# Patient Record
Sex: Male | Born: 1940 | Race: White | Hispanic: No | Marital: Married | Smoking: Former smoker
Health system: Southern US, Community
[De-identification: ages and names within clinical notes are randomized; demographics above are authoritative.]

## PROBLEM LIST (undated history)

## (undated) DIAGNOSIS — M1710 Unilateral primary osteoarthritis, unspecified knee: Secondary | ICD-10-CM

## (undated) DIAGNOSIS — K5900 Constipation, unspecified: Secondary | ICD-10-CM

## (undated) DIAGNOSIS — Z6841 Body Mass Index (BMI) 40.0 and over, adult: Secondary | ICD-10-CM

## (undated) DIAGNOSIS — G4733 Obstructive sleep apnea (adult) (pediatric): Secondary | ICD-10-CM

## (undated) DIAGNOSIS — I4719 Other supraventricular tachycardia: Secondary | ICD-10-CM

## (undated) DIAGNOSIS — K922 Gastrointestinal hemorrhage, unspecified: Secondary | ICD-10-CM

## (undated) DIAGNOSIS — M179 Osteoarthritis of knee, unspecified: Secondary | ICD-10-CM

## (undated) DIAGNOSIS — J310 Chronic rhinitis: Secondary | ICD-10-CM

## (undated) DIAGNOSIS — F5104 Psychophysiologic insomnia: Secondary | ICD-10-CM

## (undated) DIAGNOSIS — D126 Benign neoplasm of colon, unspecified: Secondary | ICD-10-CM

## (undated) DIAGNOSIS — G8929 Other chronic pain: Secondary | ICD-10-CM

## (undated) DIAGNOSIS — I471 Supraventricular tachycardia: Secondary | ICD-10-CM

## (undated) DIAGNOSIS — E119 Type 2 diabetes mellitus without complications: Secondary | ICD-10-CM

## (undated) DIAGNOSIS — M545 Low back pain, unspecified: Secondary | ICD-10-CM

## (undated) DIAGNOSIS — I872 Venous insufficiency (chronic) (peripheral): Secondary | ICD-10-CM

## (undated) DIAGNOSIS — J309 Allergic rhinitis, unspecified: Secondary | ICD-10-CM

## (undated) DIAGNOSIS — M47816 Spondylosis without myelopathy or radiculopathy, lumbar region: Secondary | ICD-10-CM

## (undated) DIAGNOSIS — D649 Anemia, unspecified: Secondary | ICD-10-CM

## (undated) DIAGNOSIS — E781 Pure hyperglyceridemia: Secondary | ICD-10-CM

## (undated) DIAGNOSIS — I10 Essential (primary) hypertension: Secondary | ICD-10-CM

## (undated) DIAGNOSIS — G6289 Other specified polyneuropathies: Secondary | ICD-10-CM

## (undated) DIAGNOSIS — G629 Polyneuropathy, unspecified: Secondary | ICD-10-CM

## (undated) HISTORY — DX: Low back pain, unspecified: M54.50

## (undated) HISTORY — PX: OTHER SURGICAL HISTORY: SHX169

## (undated) HISTORY — DX: Psychophysiologic insomnia: F51.04

## (undated) HISTORY — PX: GLAUCOMA SURGERY: SHX656

## (undated) HISTORY — DX: Hypercalcemia: E83.52

## (undated) HISTORY — DX: Venous insufficiency (chronic) (peripheral): I87.2

## (undated) HISTORY — DX: Other specified polyneuropathies: G62.89

## (undated) HISTORY — DX: Gastrointestinal hemorrhage, unspecified: K92.2

## (undated) HISTORY — DX: Unilateral primary osteoarthritis, unspecified knee: M17.10

## (undated) HISTORY — DX: Other chronic pain: G89.29

## (undated) HISTORY — DX: Type 2 diabetes mellitus without complications: E11.9

## (undated) HISTORY — DX: Constipation, unspecified: K59.00

## (undated) HISTORY — PX: CORNEAL TRANSPLANT: SHX108

## (undated) HISTORY — DX: Body Mass Index (BMI) 40.0 and over, adult: Z684

## (undated) HISTORY — DX: Supraventricular tachycardia: I47.1

## (undated) HISTORY — DX: Spondylosis without myelopathy or radiculopathy, lumbar region: M47.816

## (undated) HISTORY — DX: Osteoarthritis of knee, unspecified: M17.9

## (undated) HISTORY — DX: Other supraventricular tachycardia: I47.19

## (undated) HISTORY — DX: Benign neoplasm of colon, unspecified: D12.6

## (undated) HISTORY — DX: Allergic rhinitis, unspecified: J30.9

## (undated) HISTORY — DX: Pure hyperglyceridemia: E78.1

## (undated) HISTORY — DX: Morbid (severe) obesity due to excess calories: E66.01

## (undated) HISTORY — PX: HAND SURGERY: SHX662

## (undated) HISTORY — DX: Obstructive sleep apnea (adult) (pediatric): G47.33

## (undated) HISTORY — DX: Chronic rhinitis: J31.0

---

## 1999-06-16 ENCOUNTER — Encounter: Admission: RE | Admit: 1999-06-16 | Discharge: 1999-06-16 | Payer: Self-pay | Admitting: *Deleted

## 1999-06-16 ENCOUNTER — Encounter: Payer: Self-pay | Admitting: *Deleted

## 2002-02-04 ENCOUNTER — Inpatient Hospital Stay (HOSPITAL_COMMUNITY): Admission: EM | Admit: 2002-02-04 | Discharge: 2002-02-05 | Payer: Self-pay | Admitting: Emergency Medicine

## 2002-02-04 ENCOUNTER — Encounter: Payer: Self-pay | Admitting: Emergency Medicine

## 2002-02-04 ENCOUNTER — Encounter: Payer: Self-pay | Admitting: Internal Medicine

## 2002-02-20 ENCOUNTER — Inpatient Hospital Stay (HOSPITAL_COMMUNITY): Admission: EM | Admit: 2002-02-20 | Discharge: 2002-02-21 | Payer: Self-pay | Admitting: Emergency Medicine

## 2002-03-21 ENCOUNTER — Ambulatory Visit (HOSPITAL_BASED_OUTPATIENT_CLINIC_OR_DEPARTMENT_OTHER): Admission: RE | Admit: 2002-03-21 | Discharge: 2002-03-21 | Payer: Self-pay | Admitting: Internal Medicine

## 2009-11-02 ENCOUNTER — Encounter: Admission: RE | Admit: 2009-11-02 | Discharge: 2009-11-02 | Payer: Self-pay | Admitting: Urology

## 2009-11-02 IMAGING — CR DG CHEST 2V
2 series · 2 of 2 positions shown · non-contrast
Comparison: None.

CLINICAL DATA: Preop.

CHEST - 2 VIEW

[view not recorded (1 of 2)]
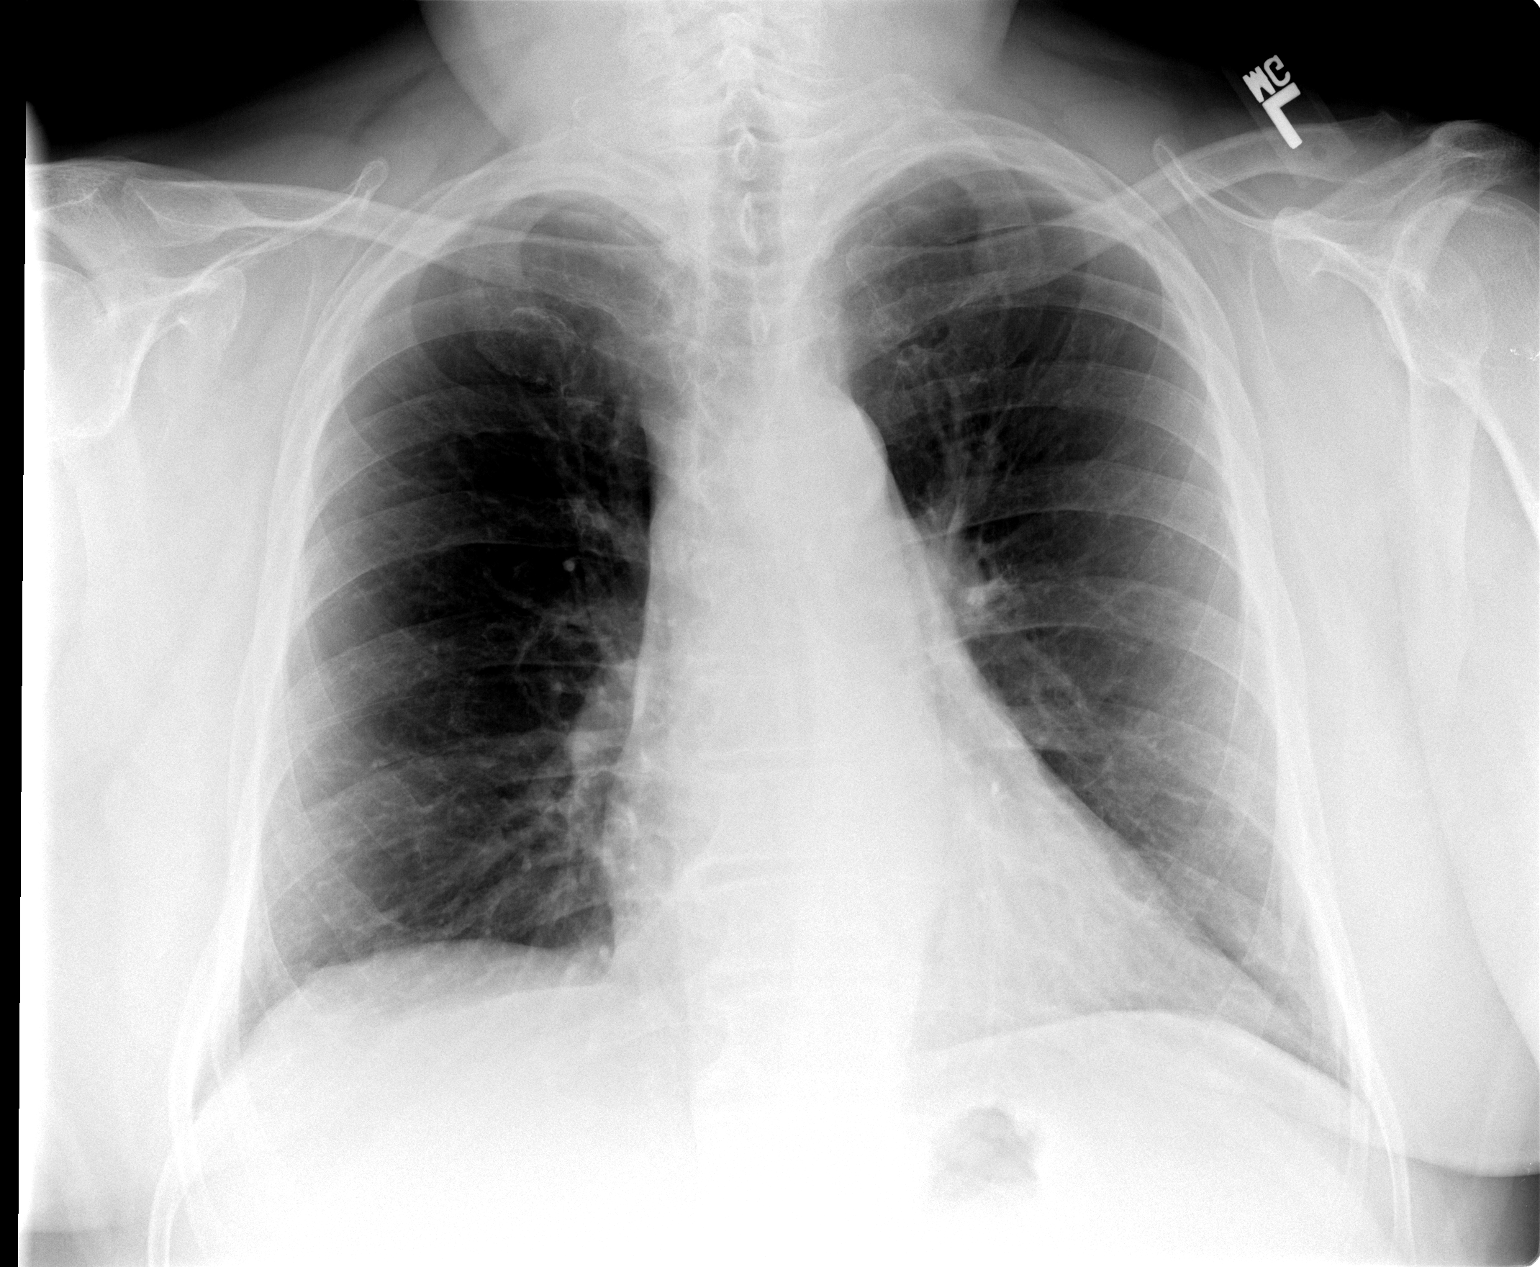

[view not recorded (2 of 2)]
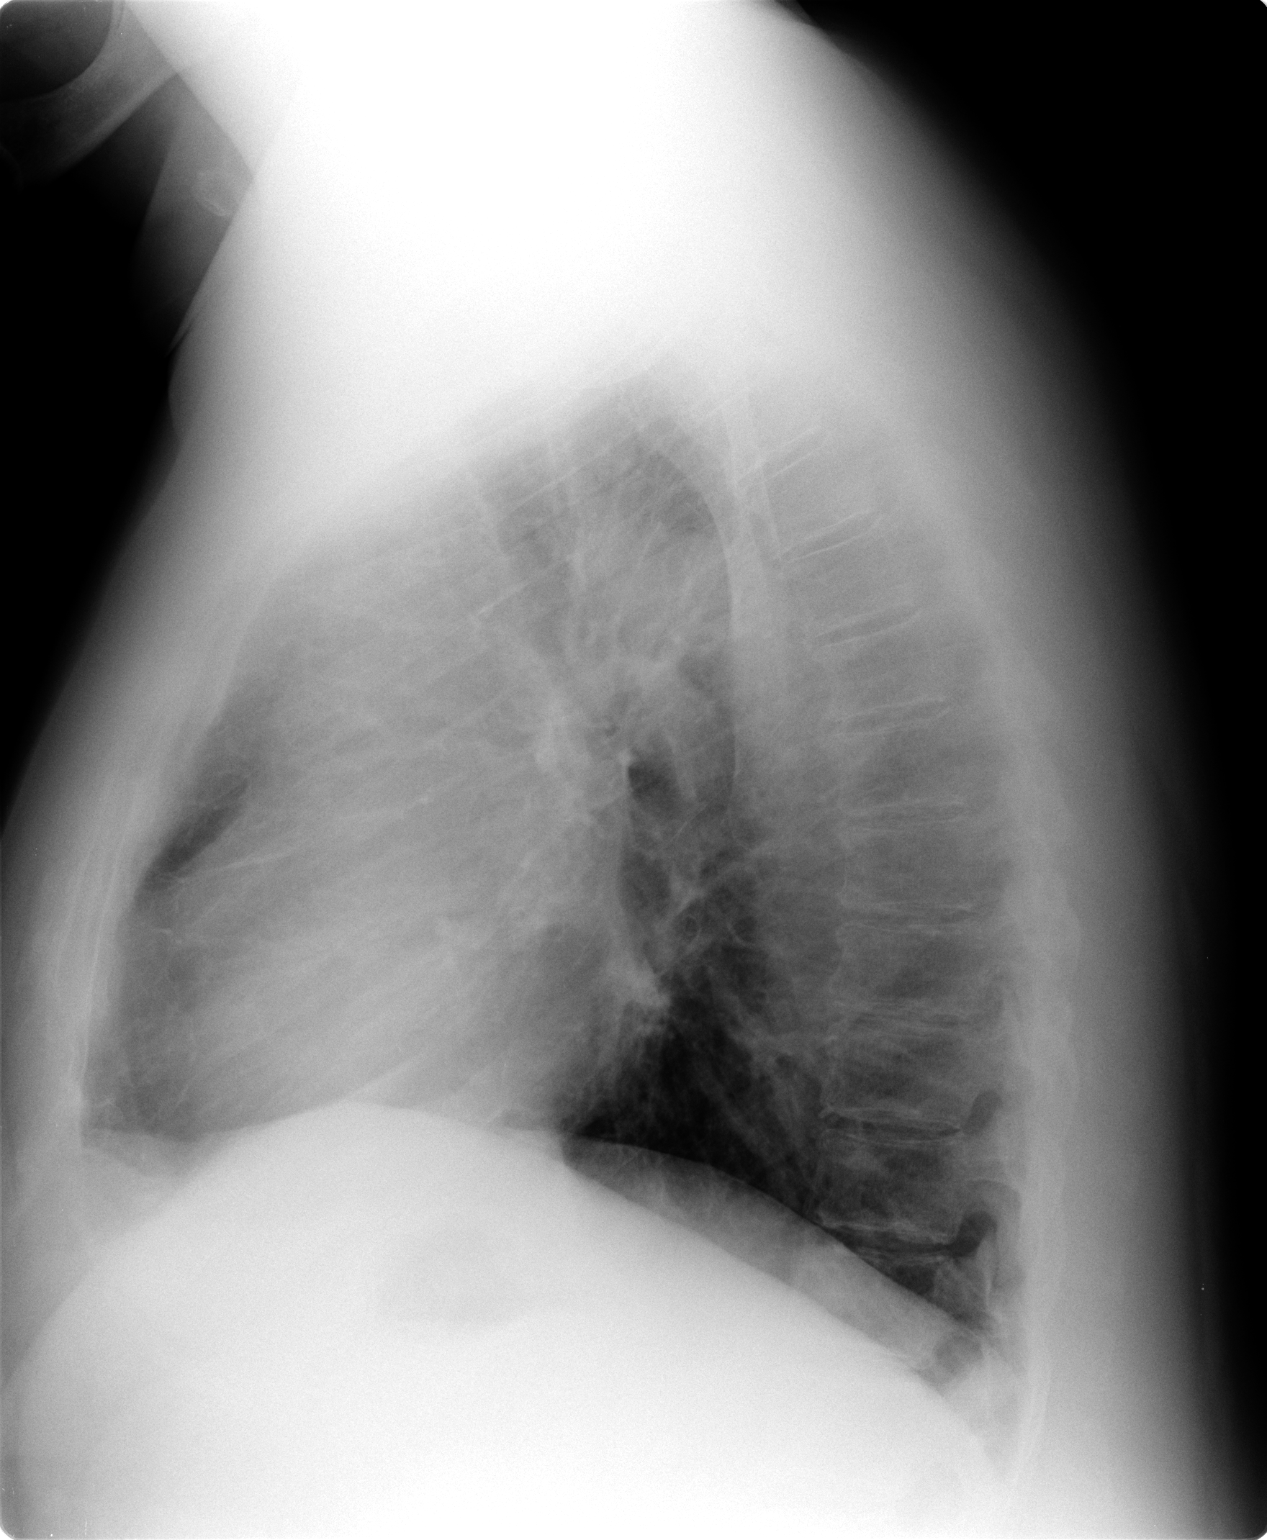

[2 of 2 positions shown; findings below may reference images not displayed]

FINDINGS: Trachea is midline.  Heart size normal.  Lungs are clear.
No pleural fluid.
IMPRESSION: No acute findings.

## 2009-11-05 ENCOUNTER — Ambulatory Visit (HOSPITAL_BASED_OUTPATIENT_CLINIC_OR_DEPARTMENT_OTHER): Admission: RE | Admit: 2009-11-05 | Discharge: 2009-11-05 | Payer: Self-pay | Admitting: Urology

## 2010-03-23 LAB — CBC
HCT: 47.3 % (ref 39.0–52.0)
Hemoglobin: 16.3 g/dL (ref 13.0–17.0)
MCH: 30.4 pg (ref 26.0–34.0)
MCHC: 34.5 g/dL (ref 30.0–36.0)
MCV: 88 fL (ref 78.0–100.0)
Platelets: 211 10*3/uL (ref 150–400)
RBC: 5.37 MIL/uL (ref 4.22–5.81)
RDW: 14.1 % (ref 11.5–15.5)
WBC: 8.7 10*3/uL (ref 4.0–10.5)

## 2010-03-23 LAB — BASIC METABOLIC PANEL
BUN: 8 mg/dL (ref 6–23)
CO2: 29 mEq/L (ref 19–32)
Calcium: 10.3 mg/dL (ref 8.4–10.5)
Chloride: 103 mEq/L (ref 96–112)
Creatinine, Ser: 1.13 mg/dL (ref 0.4–1.5)
GFR calc Af Amer: >60 mL/min (ref >60)
GFR calc non Af Amer: >60 mL/min (ref >60)
Glucose, Bld: 118 mg/dL — ABNORMAL HIGH (ref 70–99)
Potassium: 4.3 mEq/L (ref 3.5–5.1)
Sodium: 139 mEq/L (ref 135–145)
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
## 2010-05-27 NOTE — Discharge Summary (Signed)
NAME:  [name], [name] NO.:  [id number]   MEDICAL RECORD NO.:  [id number]                   PATIENT TYPE:  INP   LOCATION:  3301                                 FACILITY:  [facility]   PHYSICIAN:  Thora Lance, M.D.               DATE OF BIRTH:  1940/04/01   DATE OF ADMISSION:  02/04/2002  DATE OF DISCHARGE:  02/05/2002                                 DISCHARGE SUMMARY   REASON FOR ADMISSION:  Difficult to arouse.   HISTORY OF PRESENT ILLNESS:  The patient is a 70 year old white male,  history of hypertension, dyslipidemia, chronic insomnia.  Presented with  altered mental status.  On the morning of admission his wife found him  difficult to arouse, confused, and not making any sense.  Over the last two  months the patient has complained of frequent headaches and has been very  forgetful, not remembering things his wife tells him.  He has been very  tired, no energy.  He has a history of loud snoring and had a sleep study  about two to three years ago.  It apparently did not show definite  obstructive sleep apnea.  His wife had refilled his Ambien 10 mg #30 on  February 01, 2002, and now all of those pills are gone seven days later.  He  has imipramine 50 mg #60 refilled on December 30, 2001, and takes two of  these every night.  The family denies any recent depression.   PHYSICAL EXAMINATION:  GENERAL:  An arousable but very drowsy white male.  VITAL SIGNS:  Blood pressure 120/70, heart rate 80, respirations 24,  temperature 98.4, oxygen saturation 91-92%.  NEUROLOGIC:  The patient was arousable but very lethargic.  He moved all  extremities.  His neurologic exam was nonfocal.   ADMISSION LABORATORY DATA:  CBC:  WBC 8.2, hemoglobin 15, platelet count  238.  Chemistries:  Sodium 136, potassium 4.2, chloride 106, glucose 131,  BUN 16, creatinine 1.3, calcium 9.2, total protein 7, albumin 3.5, AST 25,  ALT 31, alkaline phosphatase 76, total bilirubin  0.7.  TSH 0.53.  B12 576.   Chest x-ray showed no acute disease.   HOSPITAL COURSE:  The patient was admitted to the stepdown unit.  He was  treated with IV fluids.  All medications were held.  A CT scan of the brain  was done and suggested a small temporal lobe lesion but no evidence of CVA,  bleed, or shift.  An MRI of the brain was obtained to further evaluate for a  brain lesion.  The MRI showed an abnormally increased signal centrally  within the pons, differential including seminal vessel disease, central  pontine myelinolysis.  There was no evidence of any brain mass.  The patient  was seen by Dr. Anne Hahn of neurology.  Dr. Anne Hahn' impression was the patient  likely had altered mental status secondary to  an Ambien overdose.  He also  felt that the patient likely had obstructive sleep apnea with significant  symptoms.  He thought that the MRI findings were minimal and consistent with  hypertensive changes in the pons.  By the second hospital day the patient  was doing much better.  His mental status changes had resolved, and he was  alert and oriented.  The patient's Ambien was discontinued, and his  imipramine was restarted.  His IV fluids were stopped, and his Foley was  removed.  The patient ambulated without problem and was discharged later in  the day.   DISCHARGE DIAGNOSES:  1. Altered mental status.  2. Possible Ambien overdose.  3. Probable obstructive sleep apnea.  4. Hypertension.  5. Chronic insomnia.  6. Dyslipidemia.  7. Glaucoma.   PROCEDURES:  1. CT scan of the brain.  2. MRI of the brain.   DISCHARGE MEDICATIONS:  1. Lisinopril 10 mg p.o. daily.  2. Norvasc 5 mg p.o. daily.  3. Imipramine 50-100 mg p.o. q.h.s.  4. Aspirin 81 mg a day.   DIET:  Low sodium.   ACTIVITY:  As tolerated.   FOLLOW-UP:  In two weeks with Dr. Valentina Lucks.  The patient will have an  outpatient sleep study arranged.                                               Thora Lance,  M.D.    Delorse Limber  D:  02/27/2002  T:  02/28/2002  Job:  [id number]

## 2010-05-27 NOTE — Op Note (Signed)
NAME:  [name], [name] NO.:  [id number]   MEDICAL RECORD NO.:  [id number]                   PATIENT TYPE:  INP   LOCATION:  5738                                 FACILITY:  [facility]   PHYSICIAN:  Danise Edge, M.D.                DATE OF BIRTH:  05-08-1940   DATE OF PROCEDURE:  02/21/2002  DATE OF DISCHARGE:                                 OPERATIVE REPORT   PROCEDURE:  Esophagogastroduodenoscopy.   INDICATIONS:  The patient is a 70 year old male admitted to the hospital  yesterday with upper gastrointestinal bleeding manifested by the passage of  melenic stool and vomiting coffee-grounds emesis.  Emergency  esophagogastroduodenoscopy was incomplete due to the presence of food,  coffee-grounds liquid, and blood clots in the gastric fundus; the remainder  of the upper GI endoscopy was normal.  The patient has stopped bleeding off  aspirin and on Protonix.  There has been no significant drop in his  hemoglobin.   ENDOSCOPIST:  Danise Edge, M.D.   PREMEDICATION:  Versed 5 mg, Demerol 50 mg.   ENDOSCOPE:  Olympus gastroscope.   DESCRIPTION OF PROCEDURE:  After obtaining informed consent, the patient was  placed in the left lateral decubitus position.  I administered intravenous  Demerol and intravenous Versed to achieve conscious sedation for the  procedure.  The patient's blood pressure, oxygen saturation, and cardiac  rhythm were monitored throughout the procedure and documented in the medical  record.   The Olympus gastroscope was passed through the posterior hypopharynx into  the proximal esophagus without difficulty.  The hypopharynx, larynx, and  vocal cords appeared normal.   Esophagoscopy:  The proximal and midsegments of the esophagus appear normal.  There are linear erosions with exudative bases extending up from the  esophagogastric junction into the distal esophagus without bleeding.   Gastroscopy:  There is a small hiatal hernia.   Retroflexed view of the  gastric cardia and fundus was normal.  The gastric body, antrum, and pylorus  appear normal.  There is no blood in the stomach.   Duodenoscopy:  The duodenal bulb and descending duodenum appear normal.   ASSESSMENT:  1. Resolved upper gastrointestinal bleeding, etiology undetermined.  2. Linear erosions in the distal esophagus associated with a small hiatal     hernia.    RECOMMENDATIONS:  1. Remain off aspirin.  2. Begin Protonix.  3. Discharge from the hospital today.                                               Danise Edge, M.D.    MJ/MEDQ  D:  02/21/2002  T:  02/21/2002  Job:  [id number]   cc:   Thora Lance, M.D.  [address]  [address]  [place] [address]  Fax: [phone]

## 2010-05-27 NOTE — Op Note (Signed)
   NAME:  [name], [name] NO.:  [id number]   MEDICAL RECORD NO.:  [id number]                   PATIENT TYPE:  EMS   LOCATION:  MAJO                                 FACILITY:  [facility]   PHYSICIAN:  Danise Edge, M.D.                DATE OF BIRTH:  [birth year]   DATE OF PROCEDURE:  DATE OF DISCHARGE:                                 OPERATIVE REPORT   PROCEDURE:  Emergency esophagogastroduodenoscopy.   DESCRIPTION OF PROCEDURE:  After obtaining informed consent, [name] was  placed in the left lateral decubitus position.  I administered intravenous  Demerol 40 mg and intravenous Versed 5 mg to achieve conscious sedation or  the procedure.  The patient's blood pressure, oxygen saturation and cardiac  rhythm were monitoring at the operative suite and documented in the medical  record.   The Olympus gastroscope was passed through the posterior hypopharynx into  the proximal esophagus without difficulty.  The hypopharynx, larynx and  vocal cords appeared normal.   Esophagoscopy:  The proximal mid and lower segments of the esophagus  appeared normal. There is no bleeding from the esophagus.   Gastroscopy:  There is a small hiatal hernia.  Examination of the gastric  cardia is normal.  I am unable to examine the fundic mucosa or the greater  curvature aspect of the gastric body due to a large amount of food and  coffeegrounds liquid obscuring my view.  The distal gastric body, antrum and  pylorus appeared normal.   Duodenoscopy:  The duodenal bulb and descending duodenum appeared normal.   ASSESSMENT:  Upper gastrointestinal bleed, localized to either the fundus or  proximal gastric body.   RECOMMENDATIONS:  1. Intravenous Protonix.  2. Clear liquid diet.  3. Repeat esophagogastroduodenoscopy February 21, 2002.                                               Danise Edge, M.D.    MJ/MEDQ  D:  02/20/2002  T:  02/20/2002  Job:  [id number]   cc:   Thora Lance, M.D.  [address]  [place]  [place] [address]  Fax: [phone]

## 2010-05-27 NOTE — Op Note (Signed)
   NAME:  TOSH, GLAZE NO.:  [id number]   MEDICAL RECORD NO.:  [id number]                   PATIENT TYPE:  EMS   LOCATION:  MAJO                                 FACILITY:  [facility]   PHYSICIAN:  Danise Edge, M.D.                DATE OF BIRTH:  11-Oct-1940   DATE OF PROCEDURE:  DATE OF DISCHARGE:                                 OPERATIVE REPORT   ADMISSION PROBLEM:  Upper gastrointestinal bleeding.   HISTORY:  Mr. [name] is a 70 year old male born 04/29/1940.  Mr.  Hoskin takes 81 mg of aspirin daily and presents to the emergency room passing  melenic stool and vomiting coffeegrounds liquid.  There is no past history  of peptic ulcer disease.   MEDICATION ALLERGIES:  None.   CHRONIC MEDICATIONS:  1. Lisinopril 10 mg daily.  2. Norvasc 5 mg daily.  3. Imipramine 100 mg nightly.  4. Aspirin 81 mg daily.   PAST MEDICAL HISTORY:  1. Hypertension.  2. Hyperlipidemia.  3. Insomnia.  4. Glaucoma.  5. Allergic rhinitis.  6. Left second finger amputation.  7. Laser surgery to the eyes.  8. Cataracts.   HABITS:  Mr. Mcpartlin doesn't smoke cigarettes or consume alcohol.   FAMILY HISTORY:  Noncontributory.   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   LABORATORY DATA:  Admission CBC normal except hemoglobin 12.9.  In addition,  complete metabolic profile normal except random glucose 142, BUN 33, albumin  3.3.  Admission prothrombin time, INR and lipase normal.   PHYSICAL EXAMINATION:  GENERAL APPEARANCE:  Mr. Thurman is alert and appears  comfortable, lying on his stretcher.  HEENT:  Sclerae non-icteric.  Oropharynx normal.  LUNGS:  Clear to auscultation.  CARDIAC EXAM:  Reveals a regular rhythm without murmurs.  ABDOMEN:  Soft, flat and nontender.   ASSESSMENT:  Upper gastrointestinal bleeding, probably aspirin-induced  ulcer.                                                 Danise Edge, M.D.    MJ/MEDQ   D:  02/20/2002  T:  02/20/2002  Job:  [id number]

## 2010-05-27 NOTE — Consult Note (Signed)
NAME:  [name], [name] NO.:  [id number]   MEDICAL RECORD NO.:  [id number]                   PATIENT TYPE:  INP   LOCATION:  3301                                 FACILITY:  [facility]   PHYSICIAN:  Marlan Palau, M.D.               DATE OF BIRTH:  May 29, 1940   DATE OF CONSULTATION:  02/04/2002  DATE OF DISCHARGE:                                   CONSULTATION   HISTORY OF PRESENT ILLNESS:  The patient is a 70 year old right-handed white  male born 1940-05-18 with a history of obesity, hypertension, and chronic  insomnia.  This patient snores quite a bit, has been evaluated by Dr. Maple Hudson  a decade ago with a sleep study.  He was found to have periodic limb  movements at night but no definite obstructive sleep apnea seen.  This  patient has continued to worsen with snoring problems and has had increasing  fatigue, memory disturbance.  Over the last two months, he has developed  early morning headaches.  Wife has also noted periodic limb movements at  night with sleep with jerks and twitches occurring during periods of apnea.  This patient is on imipramine and Ambien on a chronic basis for his sleeping  disturbance.  The patient last filled 60 of a 50 mg imipramine on 30 December 2001 and 30 of the 10 mg Ambien on 28 January 2002.  Both bottle  were empty this morning when the patient's wife came into wake him up.  The  patient could not be fully aroused, and the patient was sent to the hospital  for an evaluation.   A CT scan of the brain was performed, and there was some questionable  abnormality in the anterior portion of the left temporal lobe, but MRI scan  of the brain showed no such abnormalities. 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  The patient was admitted for further evaluation,  seems to be improving gradually as time goes on.  Neurology was asked to  see  the patient for further evaluation.   PAST MEDICAL HISTORY:  1. History of altered mental status with lethargy today with possible drug     overdose.  2. MRI scan abnormalities. By my reading, appears to be most consistent with     minimal patchy white matter changes in the pons consistent with     hypertension.  3. Obesity.  4. Obstructive sleep apnea.  5. Headache as above.  6. Cataract surgery.  7. Glaucoma.  8. Hypertriglyceridemia.  9. Borderline diabetes.  10.      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   ALLERGIES:  No known allergies.   HABITS:  The patient does not smoke or drink.   CURRENT MEDICATIONS:  1. Norvasc 5 mg a day.  2. Lisinopril  10 mg a day.  3. Ambien 10 mg at night.  4. Imipramine 100 mg at night.  5. Cosopt.   SOCIAL HISTORY:  This patient is married and lives with his wife in the  [place] area.   FAMILY MEDICAL HISTORY:  Notable in that mother is alive and well.  Father  died of unknown cause.  The patient has two brothers, one possibly with  coronary artery disease.  One sister died in childbirth or as a young  infant.   REVIEW OF SYSTEMS:  Notable in that the patient does note daily headaches,  worse in the morning.  Denies vision changes, shortness of breath, chest  pain.  Some occasional a.m. nausea noted. Denied problems with bowels or  bladder. Denies numbness or weakness on arms or legs.  Denies dizziness.   PHYSICAL EXAMINATION:  VITAL SIGNS: Blood pressure 110/70, heart rate 79,  respiratory rate 22, temperature afebrile.  GENERAL:  The patient is a moderately to markedly obese white male who is  sleepy but easily alert at the time of examination.  The patient is oriented  to person, place, date.  HEENT:  Head is atraumatic.  Eyes: Pupils are equal, round, and reactive to  light.  Disks are flat bilaterally.  NECK:  Supple.  No carotid bruits noted.  RESPIRATORY:  Examination is clear.   CARDIOVASCULAR:  Distant heart sounds.  No obvious murmurs or rubs noted.  EXTREMITIES:  Without significant edema.  NEUROLOGIC:  Cranial nerves as above.  Facial symmetry is present.  The  patient notes good sensation of face to pinprick and soft touch bilaterally.  Has good strength to facial muscles and muscles to head turning and shoulder  shrug bilaterally.  Visual fields are full. Speech is well enunciated.  Evoked nystagmus is noted in the horizontal planes.  The patient has good  strength in all fours. Good symmetric motor tone is noted throughout.  Sensory testing is intact to pinprick, soft touch, and vibratory sensation  throughout.  Good finger-to-nose and toe-to-finger bilaterally.  The patient  was not ambulated.  Deep tendon reflexes were depressed by symmetric.  Toes  were neutral bilaterally.   LABORATORY DATA:  Notable for white count of 8.2, hemoglobin 17.0,  hematocrit 49.0, MCV 83.4. Sodium136, potassium 4.6, chloride 106, CO2 22,  glucose 131, BUN 16, creatinine 1.3.  Total bilirubin 0.7, alkaline  phosphatase 76, SGOT 25, SGPT 31, total protein 7.0, albumin 3.5, calcium  9.2.  Acetaminophen and salicylate levels were low.  Drug screen otherwise  unremarkable.  Alcohol level less than 5.  Urinalysis reveals specific  gravity 1.021, pH 5.5, otherwise unremarkable.   IMPRESSION:  1. Altered mental status likely secondary to drug overdose.  2. Obstructive sleep apnea with associated symptoms of fatigue, memory     disturbance, early morning headache.  3. Obesity.   This patient appears to have a significant history of worsening sleep apnea  with some very loud snoring.  The patient can no longer sleep with his wife  due to loud snoring.  The wife has observed episodes of apnea with jerking  during the periods of apnea.  The patient again has daytime drowsiness,  falls asleep easily at night but then wakes up frequently in the middle of the night and sometimes cannot  get back to sleep.  The patient will need  further workup for sleep apnea.  The last study was done over a decade ago.   PLAN:  1. No  further neurologic workup is indicated at this time.  2. Sleep study as an outpatient.  3. MRI scan abnormalities as above are very minimal, consistent with     hypertensive changes in the mid pons     region.  I do not believe this is consistent with central pontine     myolysis.  No further neurologic workup is indicated for this problem at     this time.  May at some point consider checking thyroid profile if this     has not been done recently.                                               Marlan Palau, M.D.    CKW/MEDQ  D:  02/04/2002  T:  02/04/2002  Job:  [id number]   cc:   Thora Lance, M.D.  [address] [place]  [place] [address]  Fax: [phone]   [facility]  [address], [place] [place] [address]

## 2012-08-16 ENCOUNTER — Other Ambulatory Visit: Payer: Self-pay | Admitting: Family Medicine

## 2013-01-09 DIAGNOSIS — E119 Type 2 diabetes mellitus without complications: Secondary | ICD-10-CM

## 2013-01-09 HISTORY — DX: Type 2 diabetes mellitus without complications: E11.9

## 2013-02-18 ENCOUNTER — Emergency Department (HOSPITAL_COMMUNITY): Payer: No Typology Code available for payment source

## 2013-02-18 ENCOUNTER — Ambulatory Visit
Admission: RE | Admit: 2013-02-18 | Discharge: 2013-02-18 | Disposition: A | Discharge status: Home / Self Care | Payer: Medicare Other | Source: Ambulatory Visit | Attending: Nurse Practitioner | Admitting: Nurse Practitioner

## 2013-02-18 ENCOUNTER — Encounter (HOSPITAL_COMMUNITY): Payer: Self-pay | Admitting: Emergency Medicine

## 2013-02-18 ENCOUNTER — Other Ambulatory Visit: Payer: Self-pay | Admitting: Nurse Practitioner

## 2013-02-18 ENCOUNTER — Emergency Department (HOSPITAL_COMMUNITY)
Admission: EM | Admit: 2013-02-18 | Discharge: 2013-02-18 | Disposition: A | Discharge status: Home / Self Care | Payer: No Typology Code available for payment source | Attending: Emergency Medicine | Admitting: Emergency Medicine

## 2013-02-18 DIAGNOSIS — S20219A Contusion of unspecified front wall of thorax, initial encounter: Secondary | ICD-10-CM | POA: Insufficient documentation

## 2013-02-18 DIAGNOSIS — Z8669 Personal history of other diseases of the nervous system and sense organs: Secondary | ICD-10-CM | POA: Insufficient documentation

## 2013-02-18 DIAGNOSIS — R600 Localized edema: Secondary | ICD-10-CM

## 2013-02-18 DIAGNOSIS — IMO0002 Reserved for concepts with insufficient information to code with codable children: Secondary | ICD-10-CM | POA: Insufficient documentation

## 2013-02-18 DIAGNOSIS — S0993XA Unspecified injury of face, initial encounter: Secondary | ICD-10-CM | POA: Insufficient documentation

## 2013-02-18 DIAGNOSIS — M549 Dorsalgia, unspecified: Secondary | ICD-10-CM

## 2013-02-18 DIAGNOSIS — R109 Unspecified abdominal pain: Secondary | ICD-10-CM

## 2013-02-18 DIAGNOSIS — R0789 Other chest pain: Secondary | ICD-10-CM

## 2013-02-18 DIAGNOSIS — S0990XA Unspecified injury of head, initial encounter: Secondary | ICD-10-CM | POA: Insufficient documentation

## 2013-02-18 DIAGNOSIS — S199XXA Unspecified injury of neck, initial encounter: Secondary | ICD-10-CM

## 2013-02-18 DIAGNOSIS — I10 Essential (primary) hypertension: Secondary | ICD-10-CM | POA: Insufficient documentation

## 2013-02-18 DIAGNOSIS — R0781 Pleurodynia: Secondary | ICD-10-CM

## 2013-02-18 DIAGNOSIS — S3981XA Other specified injuries of abdomen, initial encounter: Secondary | ICD-10-CM | POA: Insufficient documentation

## 2013-02-18 DIAGNOSIS — Y9389 Activity, other specified: Secondary | ICD-10-CM | POA: Insufficient documentation

## 2013-02-18 DIAGNOSIS — R519 Headache, unspecified: Secondary | ICD-10-CM

## 2013-02-18 DIAGNOSIS — R51 Headache: Secondary | ICD-10-CM

## 2013-02-18 DIAGNOSIS — Y92410 Unspecified street and highway as the place of occurrence of the external cause: Secondary | ICD-10-CM | POA: Insufficient documentation

## 2013-02-18 HISTORY — DX: Polyneuropathy, unspecified: G62.9

## 2013-02-18 HISTORY — DX: Essential (primary) hypertension: I10

## 2013-02-18 LAB — POCT I-STAT, CHEM 8
BUN: 14 mg/dL (ref 6–23)
CALCIUM ION: 1.28 mmol/L (ref 1.13–1.30)
Chloride: 101 mEq/L (ref 96–112)
Creatinine, Ser: 1.1 mg/dL (ref 0.50–1.35)
Glucose, Bld: 117 mg/dL — ABNORMAL HIGH (ref 70–99)
HCT: 46 % (ref 39.0–52.0)
Hemoglobin: 15.6 g/dL (ref 13.0–17.0)
Potassium: 3.9 mEq/L (ref 3.7–5.3)
SODIUM: 141 meq/L (ref 137–147)
TCO2: 29 mmol/L (ref 0–100)
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
## 2013-02-18 IMAGING — CR DG FOOT 2V*L*
2 series · 2 of 2 positions shown · non-contrast
Comparison: None.

CLINICAL DATA: Swelling

EXAM:
LEFT FOOT - 2 VIEW

[t foot ap left]
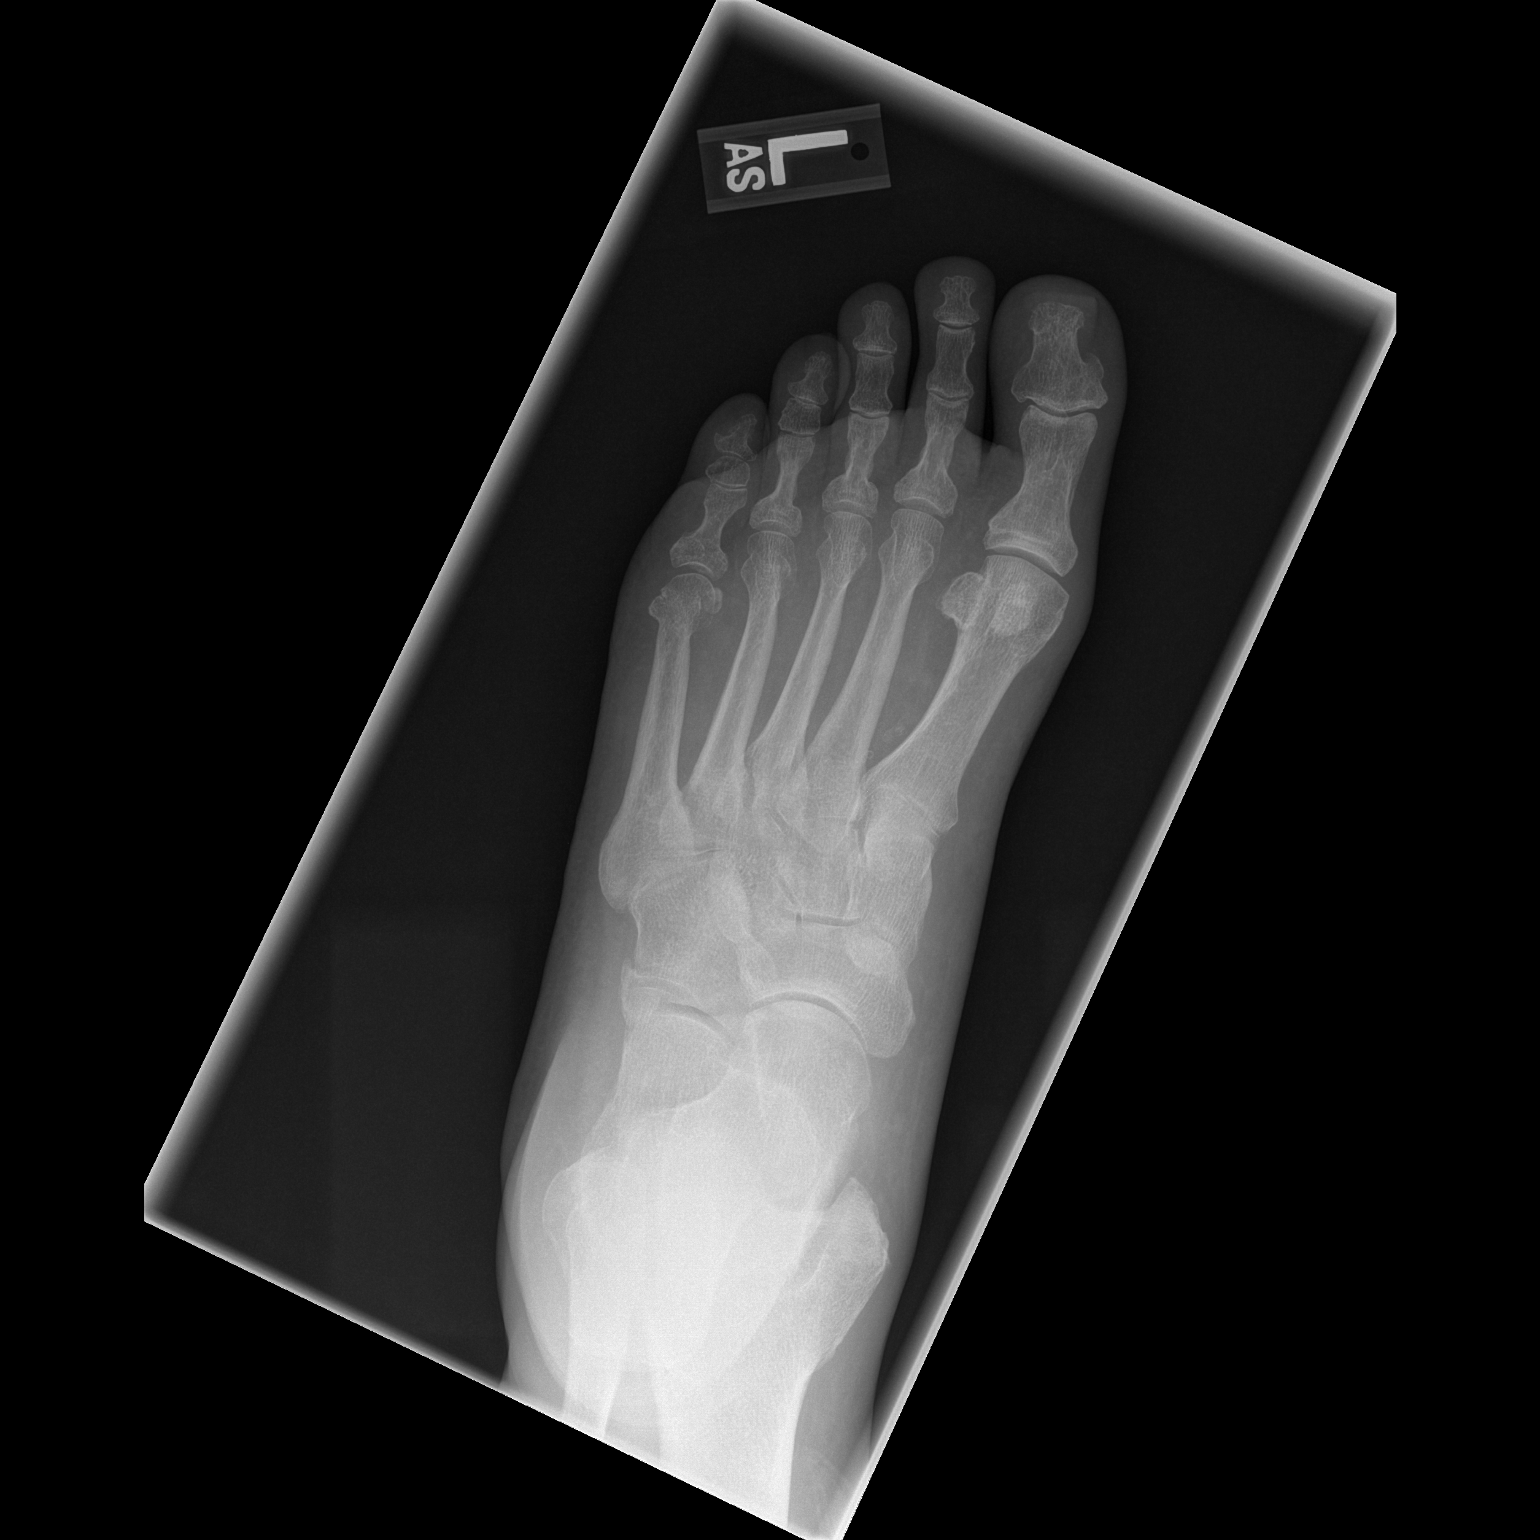

[t foot lat left]
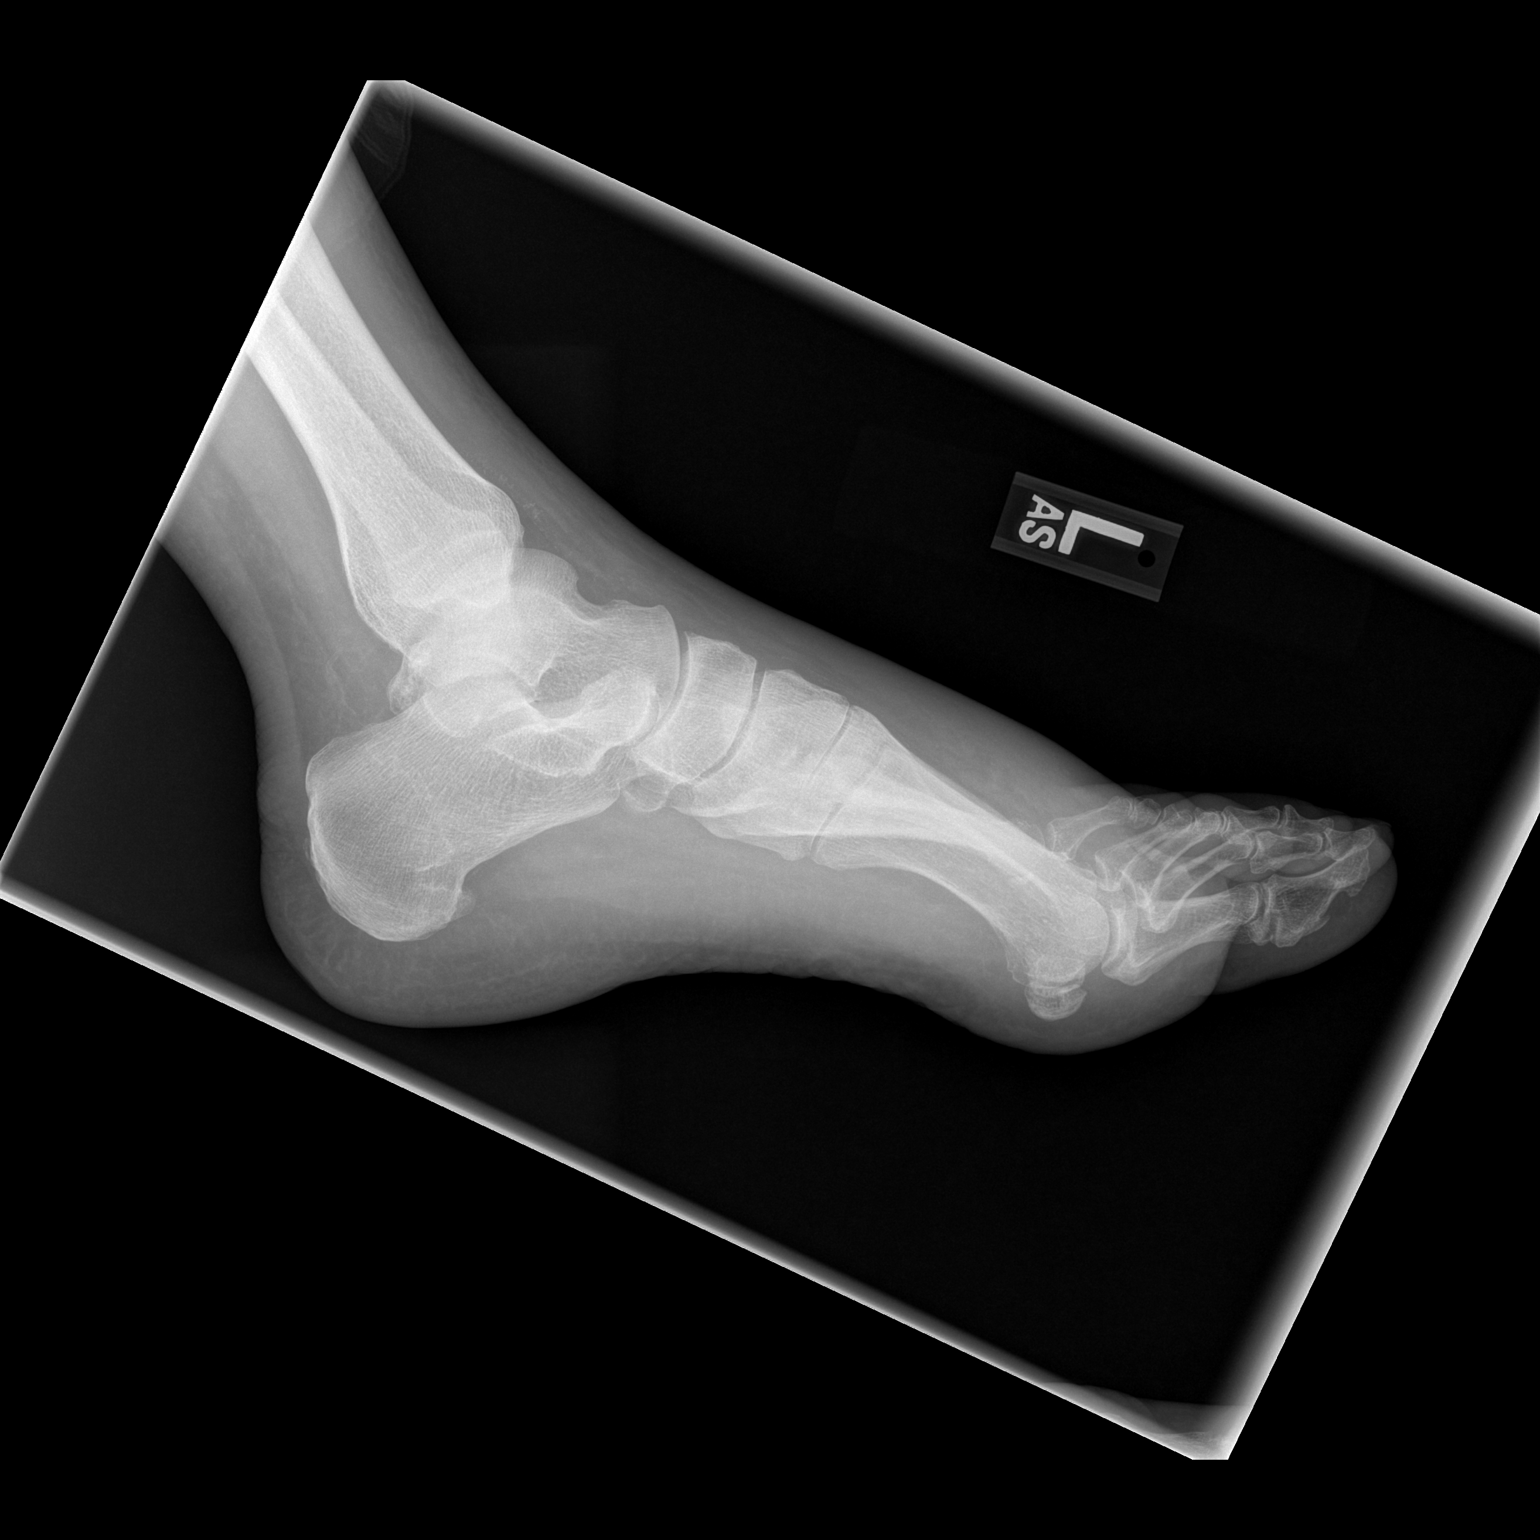

[2 of 2 positions shown; findings below may reference images not displayed]

FINDINGS: There is no evidence of fracture or dislocation. There is no
evidence of arthropathy or other focal bone abnormality. Soft
tissues are unremarkable.
IMPRESSION: Negative.
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
## 2013-02-18 IMAGING — CR DG FOOT 2V*R*
2 series · 2 of 2 positions shown · non-contrast
Comparison: None.

CLINICAL DATA: Swelling, pain

EXAM:
RIGHT FOOT - 2 VIEW

[t foot ap right]
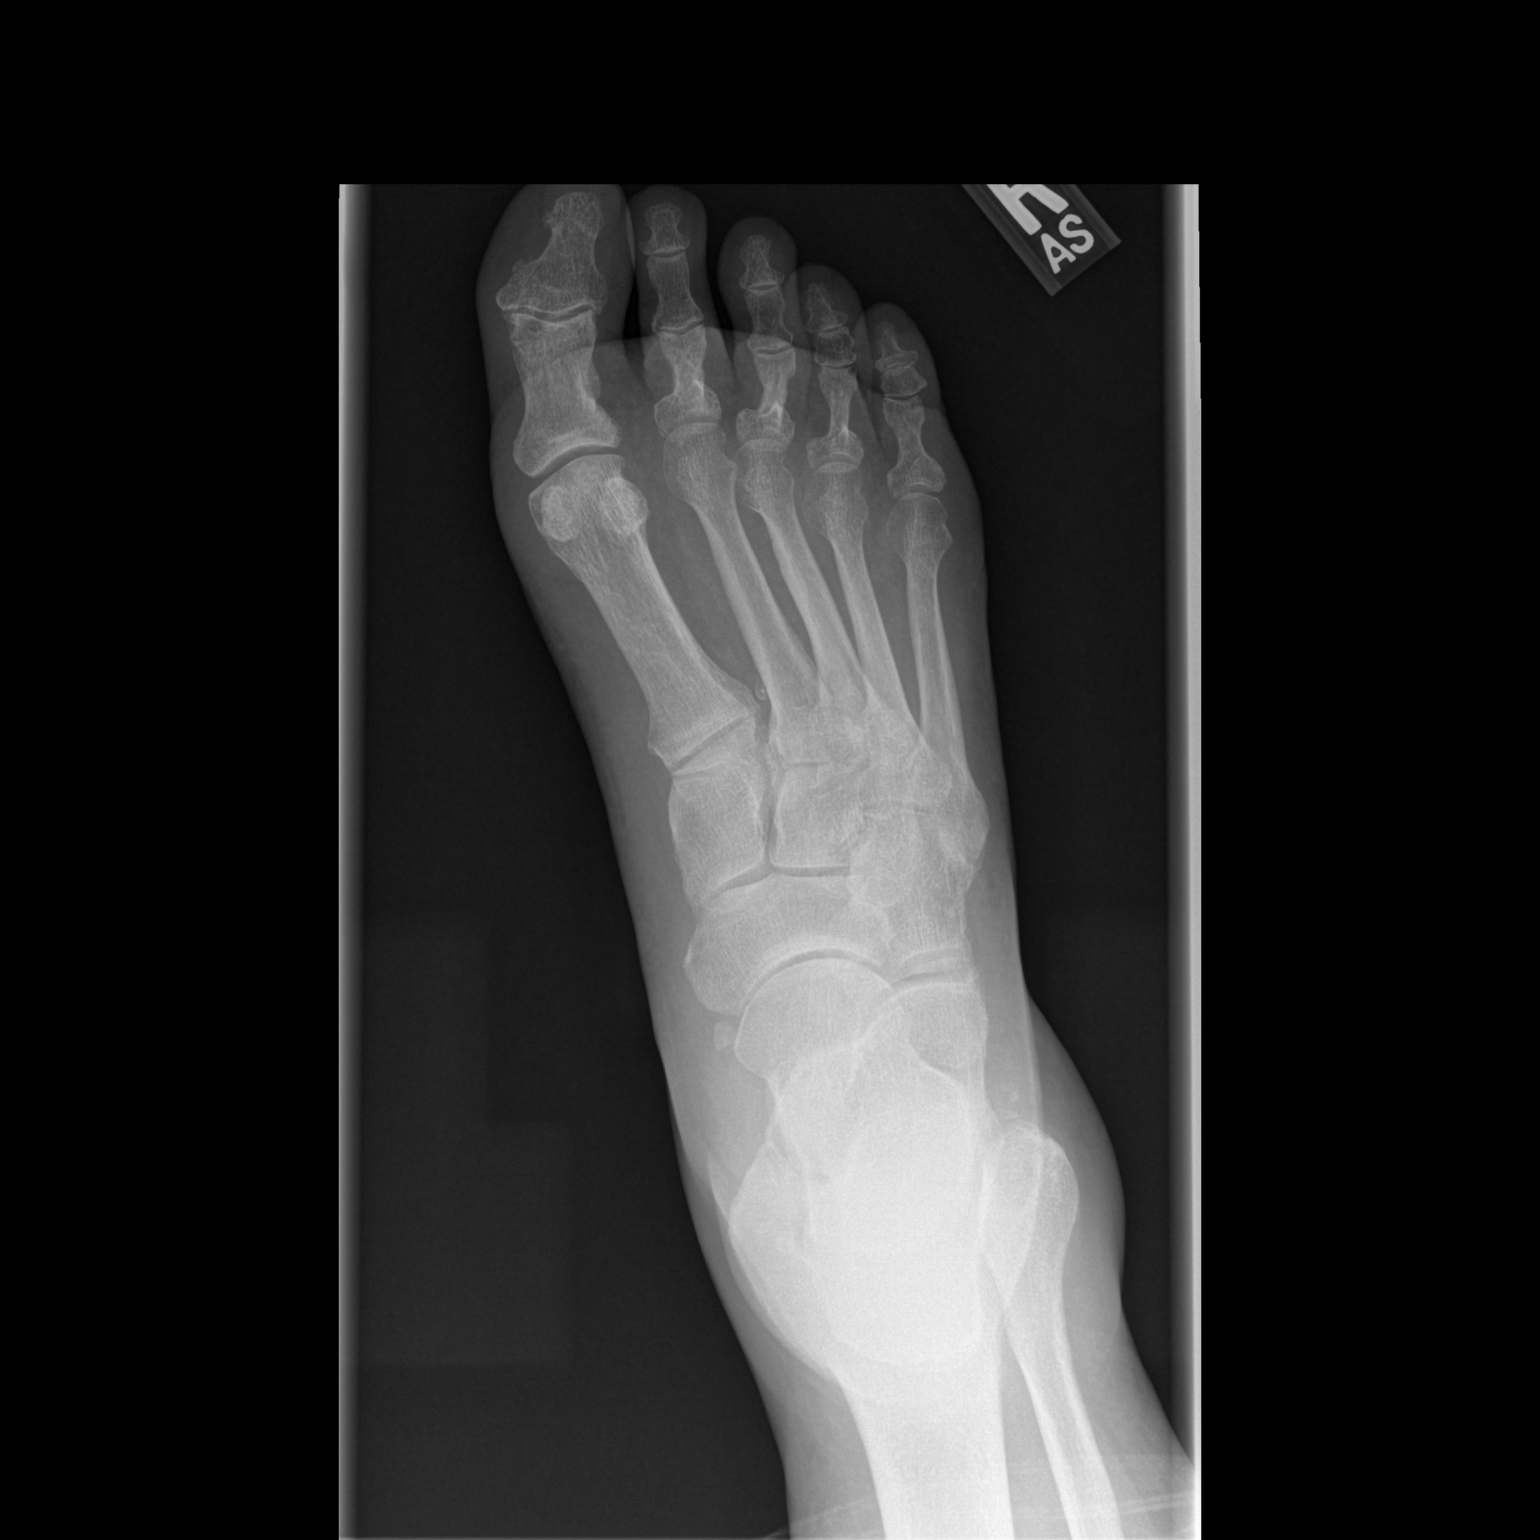

[t foot lat right]
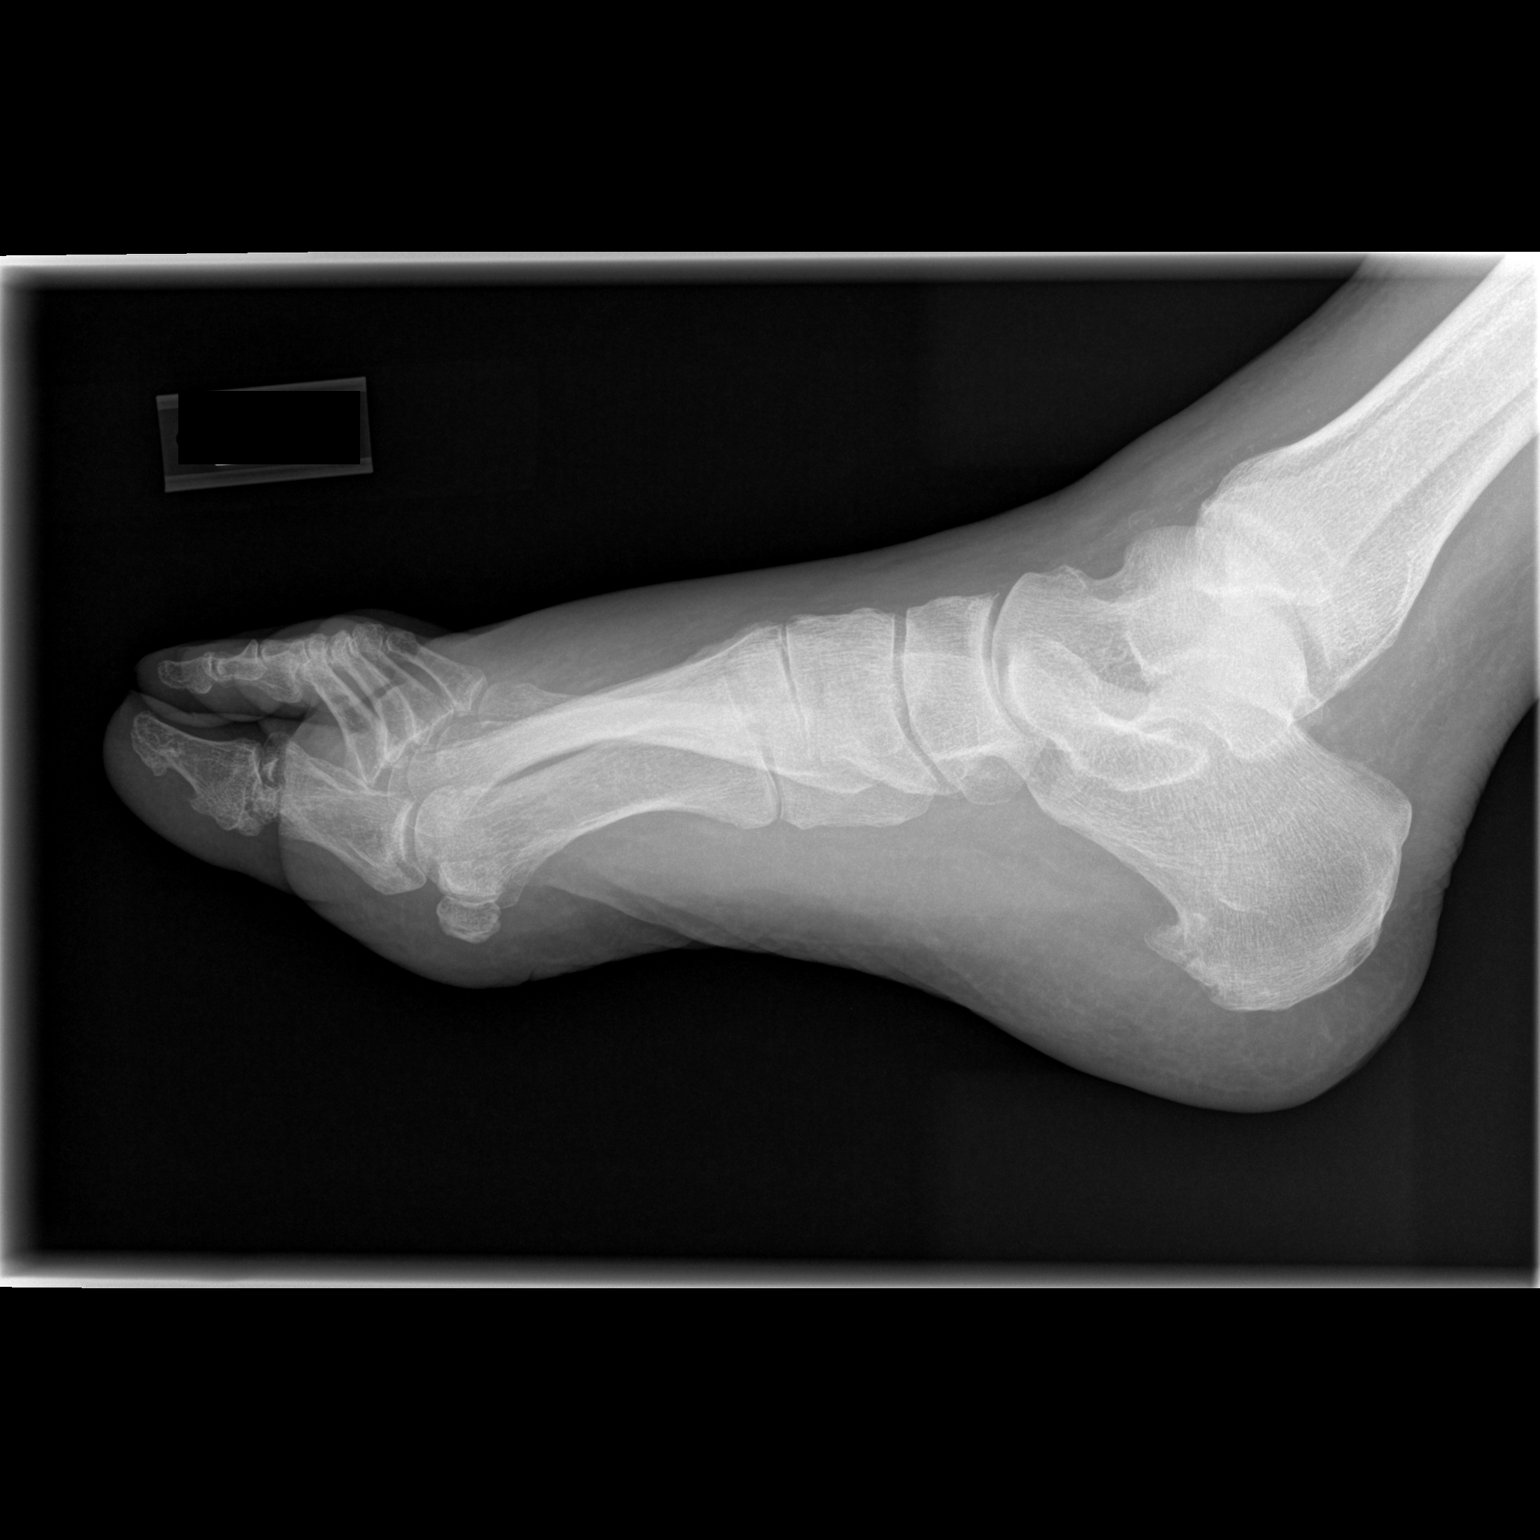

[2 of 2 positions shown; findings below may reference images not displayed]

FINDINGS: There is no evidence of fracture or dislocation. There is no
evidence of arthropathy or other focal bone abnormality. Soft
tissues are unremarkable. Atherosclerotic calcifications are
identified. Areas of hypertrophic spurring identified along the
plantar aspect of the calcaneus.
IMPRESSION: Negative.

## 2013-02-18 IMAGING — CR DG RIBS W/ CHEST 3+V*R*
3 series · 3 of 3 positions shown · non-contrast
Comparison: None.

CLINICAL DATA: Right lateral rib pain

EXAM:
RIGHT RIBS AND CHEST - 3+ VIEW

[w chest pa]
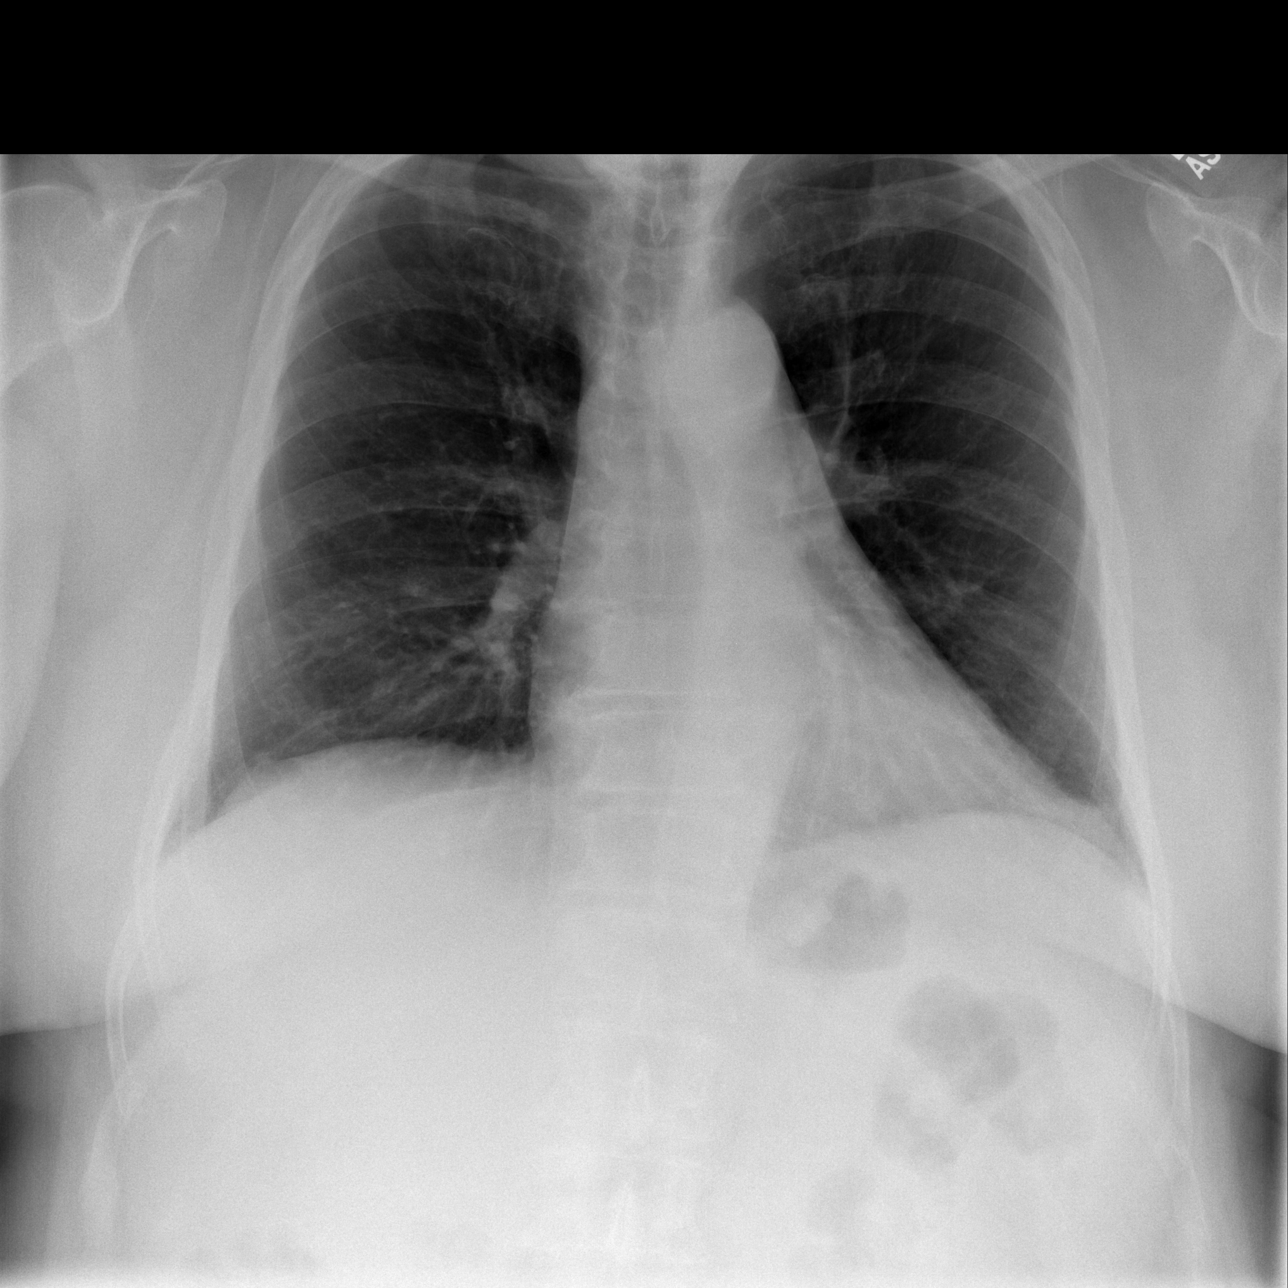

[w ribs ap/pa lower right *]
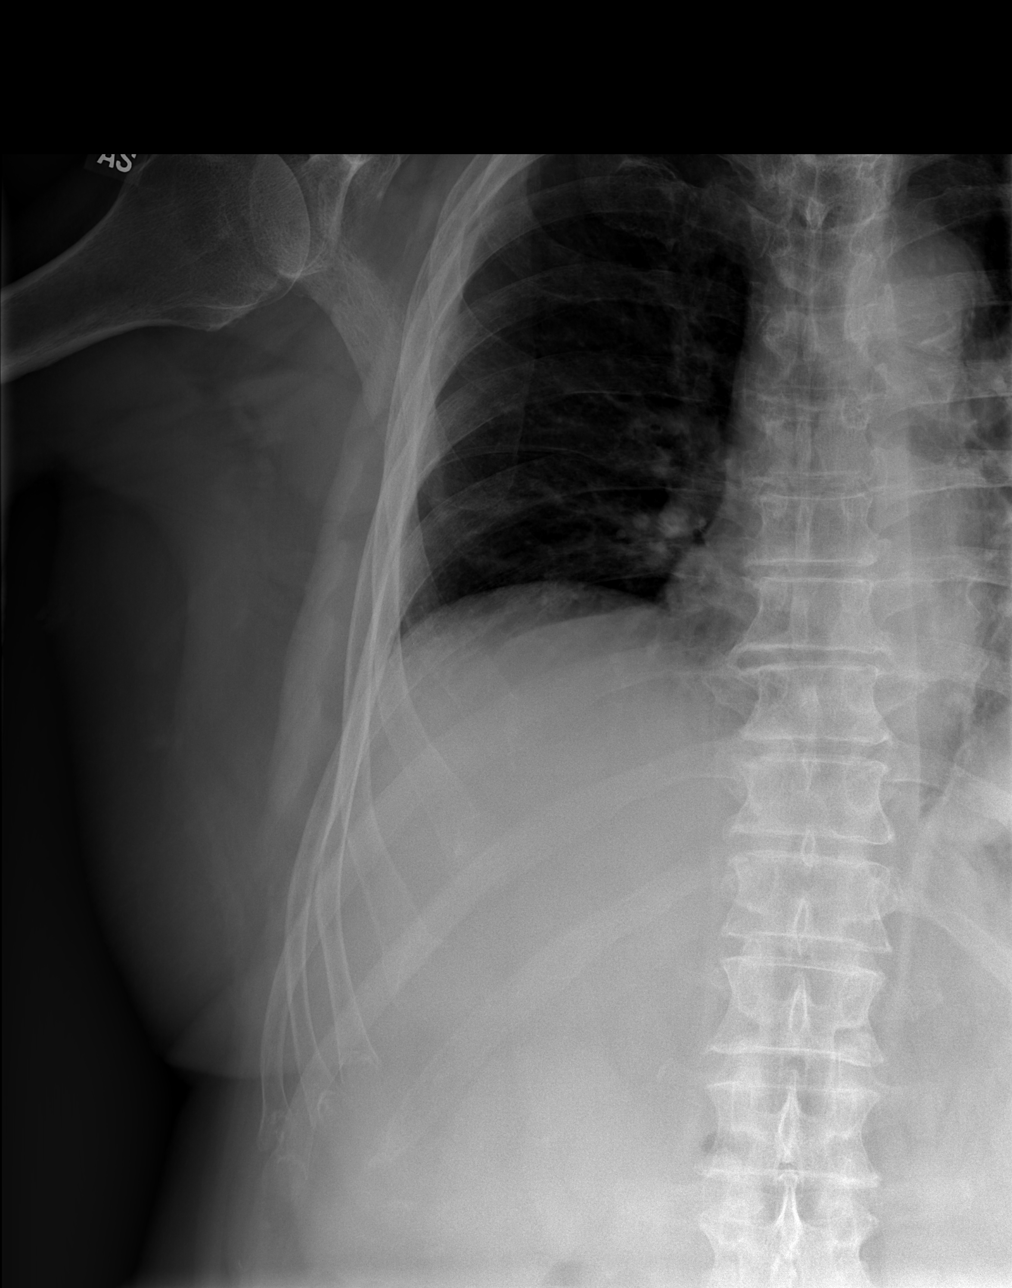

[w ribs oblique right *]
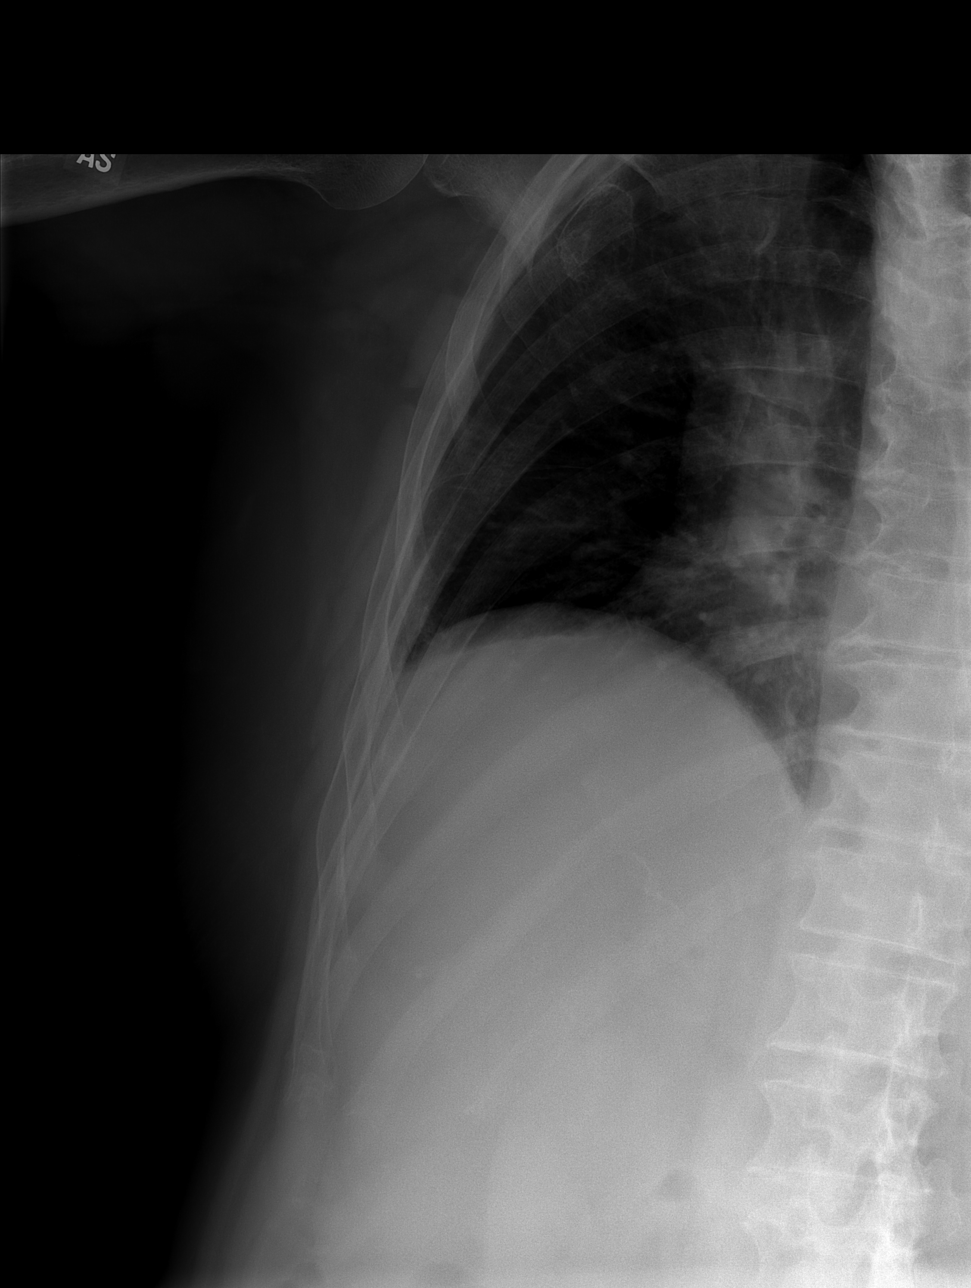

[3 of 3 positions shown; findings below may reference images not displayed]

FINDINGS: No fracture or other bone lesions are seen involving the ribs. There
is no evidence of pneumothorax or pleural effusion. Both lungs are
clear. Heart size and mediastinal contours are within normal limits.
IMPRESSION: Negative.

## 2013-02-18 MED ORDER — HYDROCODONE-ACETAMINOPHEN 5-325 MG PO TABS
1.0000 | ORAL_TABLET | Freq: Once | ORAL | Status: DC
Start: 2013-02-18 — End: 2013-02-18

## 2013-02-18 MED ORDER — MORPHINE SULFATE 4 MG/ML IJ SOLN
4.0000 mg | Freq: Once | INTRAMUSCULAR | Status: AC
  Administered 2013-02-18: 4 mg via INTRAVENOUS
  Filled 2013-02-18: qty 1

## 2013-02-18 MED ORDER — ONDANSETRON HCL 4 MG/2ML IJ SOLN
4.0000 mg | Freq: Once | INTRAMUSCULAR | Status: AC
  Administered 2013-02-18: 4 mg via INTRAVENOUS
  Filled 2013-02-18: qty 2

## 2013-02-18 MED ORDER — IOHEXOL 300 MG/ML  SOLN
100.0000 mL | Freq: Once | INTRAMUSCULAR | Status: AC | PRN
  Administered 2013-02-18: 100 mL via INTRAVENOUS

## 2013-02-18 MED ORDER — HYDROCODONE-ACETAMINOPHEN 5-325 MG PO TABS: 1.0000 | ORAL_TABLET | ORAL | Status: DC | PRN
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
## 2013-02-18 NOTE — ED Provider Notes (Signed)
Medical screening examination/treatment/procedure(s) were performed by non-physician practitioner and as supervising physician I was immediately available for consultation/collaboration.  EKG Interpretation   None        Threasa Beards, MD 02/18/13 2258
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
## 2013-04-01 ENCOUNTER — Ambulatory Visit (INDEPENDENT_AMBULATORY_CARE_PROVIDER_SITE_OTHER): Payer: Medicare Other | Admitting: Radiology

## 2013-04-01 ENCOUNTER — Encounter (INDEPENDENT_AMBULATORY_CARE_PROVIDER_SITE_OTHER): Payer: Self-pay

## 2013-04-01 DIAGNOSIS — G609 Hereditary and idiopathic neuropathy, unspecified: Secondary | ICD-10-CM

## 2013-04-04 NOTE — Procedures (Signed)
   [facility]  NCS (NERVE CONDUCTION STUDY) WITH EMG (ELECTROMYOGRAPHY) REPORT   STUDY DATE: 04/04/13 [name] DOB: [birth year] MRN: [id number]  ORDERING CLINICIAN: Lavone Orn  TECHNOLOGIST: Towana Badger ELECTROMYOGRAPHER: Earlean Polka. Kent Riendeau, MD  CLINICAL INFORMATION: 73 year old male with foot pain.  FINDINGS: NERVE CONDUCTION STUDY: Left median motor responses prolonged distal latency (4.6 ms), normal amplitude, normal conduction velocity and prolonged F-wave latency. Left ulnar motor response is normal. Left ulnar F wave latency is prolonged. Right peroneal motor response has normal distal latency, decreased amplitude, slow conduction velocity which stimulation below the knee, normal conduction velocity with stimulation above the knee, normal F-wave latency. Left peroneal motor response has normal distal latency, decreased amplitude, slow conduction velocity and prolonged F-wave latency. Right tibial motor response has normal distal latency, decreased amplitude, normal conduction velocity and normal F-wave latency. Left tibial motor response has normal distal latency, decreased after, normal conduction velocity and prolonged F-wave latency.   Left median and left ulnar sensory responses are decreased amplitudes and slow conduction velocities. Bilateral sural sensory responses could not be obtained.  NEEDLE ELECTROMYOGRAPHY: Needle EMG was not requested/ordered.  IMPRESSION:  Abnormal study demonstrating length-dependent, axonal, sensorimotor polyneuropathy, affecting the upper and lower extremities. The lower extremities are more affected than the upper extremities.    INTERPRETING PHYSICIAN:  Penni Bombard, MD Certified in Neurology, Neurophysiology and Neuroimaging  [facility] [address], [place], [place] [address] [phone]

## 2013-08-01 DIAGNOSIS — I10 Essential (primary) hypertension: Secondary | ICD-10-CM | POA: Diagnosis present

## 2013-08-07 ENCOUNTER — Ambulatory Visit (HOSPITAL_COMMUNITY): Payer: Medicare Other | Attending: Cardiovascular Disease | Admitting: Radiology

## 2013-08-07 ENCOUNTER — Other Ambulatory Visit (HOSPITAL_COMMUNITY): Payer: Self-pay | Admitting: Internal Medicine

## 2013-08-07 DIAGNOSIS — R011 Cardiac murmur, unspecified: Secondary | ICD-10-CM

## 2013-08-07 NOTE — Progress Notes (Signed)
Echocardiogram performed.  

## 2013-09-10 ENCOUNTER — Encounter: Payer: Self-pay | Admitting: Neurology

## 2013-09-10 ENCOUNTER — Ambulatory Visit (INDEPENDENT_AMBULATORY_CARE_PROVIDER_SITE_OTHER): Payer: Medicare Other | Admitting: Neurology

## 2013-09-10 VITALS — BP 130/84 | HR 63 | Ht 70.0 in | Wt 258.4 lb

## 2013-09-10 DIAGNOSIS — E1142 Type 2 diabetes mellitus with diabetic polyneuropathy: Secondary | ICD-10-CM

## 2013-09-10 DIAGNOSIS — E1149 Type 2 diabetes mellitus with other diabetic neurological complication: Secondary | ICD-10-CM

## 2013-09-10 DIAGNOSIS — G609 Hereditary and idiopathic neuropathy, unspecified: Secondary | ICD-10-CM

## 2013-09-10 DIAGNOSIS — E1140 Type 2 diabetes mellitus with diabetic neuropathy, unspecified: Secondary | ICD-10-CM

## 2013-09-10 MED ORDER — LIDOCAINE 5 % EX OINT: TOPICAL_OINTMENT | CUTANEOUS | Status: DC

## 2013-09-10 MED ORDER — GABAPENTIN 300 MG PO CAPS: 300.0000 mg | ORAL_CAPSULE | Freq: Every day | ORAL | Status: DC

## 2013-09-10 NOTE — Progress Notes (Signed)
[facility] Clinic Note - Initial Visit   Date: 09/10/2013  [name] MRN: [id number] DOB: 1940-07-25   Dear Dr Laurann Montana:   Thank you for your kind referral of [name] for consultation of neuropathy. Although his history is well known to you, please allow [place] to reiterate it for the purpose of our medical record. The patient was accompanied to the clinic by wife who also provides collateral information.     History of Present Illness: [name] is a 73 y.o. right-handed Caucasian male with history of hypertension, diabetes mellitus (diagnosed 2015, HbA1c 7.2), OSA, and chronic back pain presenting for evaluation of burning feet.    Starting around 2010, he developed burning sensation of his feet which has worsened to involve the level of the ankle. Symptoms are constant and described as "prickly, a balloon at the bottom his feet".  He was started on gabapentin 300mg  TID which helps somewhat. Pain is worsened by nothing.  Because of persistent symptoms, his gabapentin was increased to 600mg  TID.   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  He feels as if his balance is worse because he uses furniture for support.  He started using a cane 6-8 months ago. He had one fall a few months ago because he tripped on something.   He is seeing orthopeadics surgery for low back pain and is having a MRI of this lumbar spine in the upcoming future.  Out-side paper records, electronic medical record, and images have been reviewed where available and summarized as:  EMG of upper and lower extemities 04/01/2013: Abnormal study demonstrating length-dependent, axonal, sensorimotor polyneuropathy, affecting the upper and lower  extremities. The lower extremities are more affected than the upper extremities.   HbA1c 7.2   Past Medical History  Diagnosis Date  . Hypertension   . Neuropathy   .  Diabetes mellitus 2015  . OSA (obstructive sleep apnea)   . Upper GI bleed     due to aspirin    Past Surgical History  Procedure Laterality Date  . Hand surgery Left     s/p amputation of left finger  . Cataract      Bilateral  . Glaucoma surgery       Medications:  Current Outpatient Prescriptions on File Prior to Visit  Medication Sig Dispense Refill  . acetaminophen (TYLENOL EX ST ARTHRITIS PAIN) 500 MG tablet Take 1,000 mg by mouth every 6 (six) hours as needed for moderate pain.      [name] Kitchen azelastine (ASTELIN) 137 MCG/SPRAY nasal spray Place 1 spray into both nostrils 2 (two) times daily.       [name] Kitchen gabapentin (NEURONTIN) 300 MG capsule Take 300 mg by mouth 3 (three) times daily.      [name] Kitchen HYDROcodone-acetaminophen (NORCO/VICODIN) 5-325 MG per tablet Take 1 tablet by mouth every 4 (four) hours as needed for moderate pain or severe pain.  10 tablet  0  . lisinopril-hydrochlorothiazide (PRINZIDE,ZESTORETIC) 10-12.5 MG per tablet Take 1 tablet by mouth daily.      . Multiple Vitamin (MULTIVITAMIN WITH MINERALS) TABS tablet Take 1 tablet by mouth daily.      [name] Kitchen omeprazole (PRILOSEC) 20 MG capsule Take 20 mg by mouth daily.      . traZODone (DESYREL) 100 MG tablet Take 100 mg by mouth at bedtime as needed.       No current facility-administered medications on file prior to visit.  Allergies:  Allergies  Allergen Reactions  . Ambien [Zolpidem Tartrate]     "lost his mind"  . Aspirin     Bleeding ulcer    Family History: Family History  Problem Relation Age of Onset  . Gout Father   . Osteoarthritis Mother     Deceased, 24  . Healthy Brother     x2  . Healthy Son     x2    Social History: History   Social History  . Marital Status: Single    Spouse Name: N/A    Number of Children: N/A  . Years of Education: N/A   Occupational History  . Not on file.   Social History Main Topics  . Smoking status: Former Research scientist (life sciences)  . Smokeless tobacco: Not on file  . Alcohol Use:  No  . Drug Use: No  . Sexual Activity: Not on file   Other Topics Concern  . Not on file   Social History Narrative   Lives with wife.  They have two grown sons.   Retired Engineer, maintenance.   Highest level of education:  GED    Review of Systems:  CONSTITUTIONAL: No fevers, chills, night sweats, or weight loss.   EYES: No visual changes or eye pain ENT: No hearing changes.  No history of nose bleeds.   RESPIRATORY: No cough, wheezing and shortness of breath.   CARDIOVASCULAR: Negative for chest pain, and palpitations.   GI: Negative for abdominal discomfort, blood in stools or black stools.  No recent change in bowel habits.   GU:  No history of incontinence.   MUSCLOSKELETAL: + history of joint pain or swelling.  No myalgias.   SKIN: Negative for lesions, rash, and itching.   HEMATOLOGY/ONCOLOGY: Negative for prolonged bleeding, bruising easily, and swollen nodes.  No history of cancer.   ENDOCRINE: Negative for cold or heat intolerance, polydipsia or goiter.   PSYCH:  No depression or anxiety symptoms.   NEURO: As Above.   Vital Signs:  BP 130/84  Pulse 63  Ht 5\' 10"  (1.778 m)  Wt 258 lb 6 oz (117.198 kg)  BMI 37.07 kg/m2  SpO2 95%   General Medical Exam:   General:  Well appearing, comfortable.   Eyes/ENT: see cranial nerve examination.   Neck: No masses appreciated.  Full range of motion without tenderness.  No carotid bruits. Respiratory:  Clear to auscultation, good air entry bilaterally.   Cardiac:  Regular rate and rhythm, no murmur.   Extremities:  Left index finger amputated.  Reduced hair growth distally over the legs Skin:  Skin color, texture, turgor normal. No rashes or lesions.  Neurological Exam: MENTAL STATUS including orientation to time, place, person, recent and remote memory, attention span and concentration, language, and fund of knowledge is normal.  Speech is not dysarthric.  CRANIAL NERVES: II:  No visual field defects.  Unremarkable fundi.     III-IV-VI: Pupils equal round and reactive to light.  Normal conjugate, extra-ocular eye movements in all directions of gaze.  No nystagmus.  No ptosis..   V:  Normal facial sensation.    VII:  Normal facial symmetry and movements.  VIII:  Normal hearing and vestibular function.   IX-X:  Normal palatal movement.   XI:  Normal shoulder shrug and head rotation.   XII:  Normal tongue strength and range of motion, no deviation or fasciculation.  MOTOR:  No atrophy, fasciculations or abnormal movements.  No pronator drift.  Tone is normal.  Right Upper Extremity:    Left Upper Extremity:    Deltoid  5/5   Deltoid  5/5   Biceps  5/5   Biceps  5/5   Triceps  5/5   Triceps  5/5   Wrist extensors  5/5   Wrist extensors  5/5   Wrist flexors  5/5   Wrist flexors  5/5   Finger extensors  5/5   Finger extensors  5/5   Finger flexors  5/5   Finger flexors  5/5   Dorsal interossei  5/5   Dorsal interossei  5/5   Abductor pollicis  5/5   Abductor pollicis  5/5   Tone (Ashworth scale)  0  Tone (Ashworth scale)  0   Right Lower Extremity:    Left Lower Extremity:    Hip flexors  5/5   Hip flexors  5/5   Hip extensors  5/5   Hip extensors  5/5   Knee flexors  5/5   Knee flexors  5/5   Knee extensors  5/5   Knee extensors  5/5   Dorsiflexors  5/5   Dorsiflexors  5/5   Plantarflexors  5/5   Plantarflexors  5/5   Toe extensors  5-/5   Toe extensors  5-/5   Toe flexors  5/5   Toe flexors  5/5   Tone (Ashworth scale)  0  Tone (Ashworth scale)  0   MSRs:  Right                                                                 Left brachioradialis 2+  brachioradialis 2+  biceps 2+  biceps 2+  triceps 2+  triceps 2+  patellar 2+  patellar 2+  ankle jerk 0  ankle jerk 0  Hoffman no  Hoffman no  plantar response down  plantar response down   SENSORY:  Vibration and temperature absent distal to ankles bilaterally.  Proprioception impaired at the great toe bilaterally. Pin prick intact throughout.   Romberg's sign present.   COORDINATION/GAIT: Normal finger-to- nose-finger and heel-to-shin.  Intact rapid alternating movements bilaterally.  Unable to rise from a chair without using arms due to back pain.  Gait is moderately stooped, wide-based and slow.  There is poor arm swing and he appears mildly unsteady.  He is unable to perform tandem gait.    IMPRESSION: Mr. Heist is a 73 year-old gentleman presenting for evaluation of painful feet dysesthesias.  His exam is notable for gradient pattern of sensory loss, mild distal weakness, and arreflexia at the Achilles consistent with length-dependent pattern of neuropathy.  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    I had extensive discussion with the patient regarding the pathogenesis, etiology, management, and natural course of neuropathy. Neuropathy tends to be slowly progressive, especially if a treatable etiology is not identified.  I would like to test for treatable causes of neuropathy. Because he does have diabetes, I strongly urged patient to keep tight glycemic control to minimize worsening of his neuropathy.   From a symptomatic standpoint, he is most bothered by nighttime symptoms, so I will optimize his neurontin dose at bedtime and also offered lidocaine ointment.  PLAN/RECOMMENDATIONS:  1.  Check TSH, vitamin B12, copper, ceruloplasmin, SPEP/UPEP with IFE 2.  Take neurontin 600mg  in the morning and afternoon, and take 900mg  (600mg  + 300mg  tab) at bedtime 3.  Start using lidocaine ointment to feet, instructed to wear gloves when using 4.  If balance worsen, consider gait training (balance therapy) going forward 5.  Encouraged to maintain tight control of diabetes 6.  Fall precautions discussed 7.  Return to clinic in 68-months    The duration of this appointment visit was 50 minutes of face-to-face time with the  patient.  Greater than 50% of this time was spent in counseling, explanation of diagnosis, planning of further management, and coordination of care.   Thank you for allowing me to participate in patient's care.  If I can answer any additional questions, I would be pleased to do so.    Sincerely,    Donika K. Posey Pronto, DO

## 2013-09-10 NOTE — Patient Instructions (Addendum)
1.  Check blood work 2.  Take neurontin 600mg  in the morning and afternoon, and take 900mg  (600mg  + 300mg  tab) at bedtime 3.  Start using lidocaine ointment to feet, wear gloves when using 4.  If balance worsen, consider gait training (balance therapy) going forward 5.  Encouraged to maintain tight control of diabetes 6.  Return to clinic in 94-months  [facility]  Preventing Falls in the [place] are common, often dreaded events in the lives of older people. Aside from the obvious injuries and even death that may result, falls can cause wide-ranging consequences including loss of independence, mental decline, decreased activity, and mobility. Younger people are also at risk of falling, especially those with chronic illnesses and fatigue.  Ways to reduce the risk for falling:  * Examine diet and medications. Warm foods and alcohol dilate blood vessels, which can lead to dizziness when standing. Sleep aids, antidepressants, and pain medications can also increase the likelihood of a fall.  * Get a vison exam. Poor vision, cataracts, and glaucoma increase the chances of falling.  * Check foot gear. Shoes should fit snugly and have a sturdy, nonskid sole and broad, low heel.  * Participate in a physician-approved exercise program to build and maintain muscle strength and improve balance and coordination.  * Increase vitamin D intake. Vitamin D improves muscle strength and increases the amount of calcium the body is able to absorb and deposit in bones.  How to prevent falls from common hazards:  * Floors - Remove all loose wires, cords, and throw rugs. Minimize clutter. Make sure rugs are anchored and smooth. Keep furniture in its usual place.  * Chairs - Use chairs with straight backs, armrests, and firm seats. Add firm cushions to existing pieces to add height.  * Bathroom - Install grab bars and non-skid tape in the tub or shower. Use a bathtub transfer bench or a shower chair with a back  support. Use an elevated toilet seat and/or safety rails to assist standing from a low surface. Do not use towel racks or bathroom tissue holders to help you stand.  * Lighting - Make sure halls, stairways, and entrances are well-lit. Install a night light in your bathroom or hallway. Make sure there is a light switch at the top and bottom of the staircase. Turn lights on if you get up in the middle of the night. Make sure lamps or light switches are within reach of the bed if you have to get up during the night.  * Kitchen - Install non-skid rubber mats near the sink and stove. Clean spills immediately. Store frequently used utensils, pots, and pans between waist and eye level. This helps prevent reaching and bending. Sit when getting things out of the lower cupboards.  * Living room / Jackson furniture with wide spaces in between, giving enough room to move around. Establish a route through the living room that gives you something to hold onto as you walk.  * Stairs - Make sure treads, rails, and rugs are secure. Install a rail on both sides of the stairs. If stairs are a threat, it might be helpful to arrange most of your activities on the lower level to reduce the number of times you must climb the stairs.  * Entrances and doorways - Install metal handles on the walls adjacent to the doorknobs of all doors to make it more secure as you travel through the doorway.  Tips for maintaining  balance:  * Keep at least one hand free at all times Try using a backpack or fanny pack to hold things rather than carrying them in your hands. Never carry objects in both hands when walking as this interferes with keeping your balance.  * Attempt to swing both arms from front to back while walking. This might require a conscious effort if Parkinson's disease has diminished your movement. It will, however, help you to maintain balance and posture, and reduce fatigue.  * Consciously lift your feet off the ground  when walking. Shuffling and dragging of the feet is a common culprit in losing your balance.  * When trying to navigate turns, use a "U" technique of facing forward and making a wide turn, rather than pivoting sharply.  * Try to stand with your feet shoulder-length apart. When your feet are close together for any length of time, you increase your risk of losing your balance and falling.  * Do one thing at a time. Do not try to walk and accomplish another task, such as reading or looking around. The decrease in your automatic reflexes complicates motor function, so the less distraction, the better.  * Do not wear rubber or gripping soled shoes, they might "catch" on the floor and cause tripping.  * Move slowly when changing positions. Use deliberate, concentrated movements and, if needed, use a grab bar or walking aid. Count fifteen (15) seconds after standing to begin walking.  * If balance is a continuous problem, you might want to consider a walking aid such as a cane, walking stick, or walker. Once you have mastered walking with help, you may be ready to try it again on your own.  This information is provided by [facility] and is not intended to replace the medical advice of your physician or other health care providers. Please consult your physician or other health care providers for advice regarding your specific medical condition.

## 2013-09-10 NOTE — Progress Notes (Signed)
Note faxed.

## 2013-09-11 LAB — TSH: TSH: 0.388 u[IU]/mL (ref 0.350–4.500)

## 2013-09-11 LAB — VITAMIN B12: VITAMIN B 12: 565 pg/mL (ref 211–911)

## 2013-09-12 LAB — SPEP & IFE WITH QIG
Albumin ELP: 58.5 % (ref 55.8–66.1)
Alpha-1-Globulin: 4.3 % (ref 2.9–4.9)
Alpha-2-Globulin: 12.9 % — ABNORMAL HIGH (ref 7.1–11.8)
BETA GLOBULIN: 7.3 % — AB (ref 4.7–7.2)
Beta 2: 5.9 % (ref 3.2–6.5)
Gamma Globulin: 11.1 % (ref 11.1–18.8)
IGA: 350 mg/dL (ref 68–379)
IgG (Immunoglobin G), Serum: 864 mg/dL (ref 650–1600)
IgM, Serum: 62 mg/dL (ref 41–251)
Total Protein, Serum Electrophoresis: 7.3 g/dL (ref 6.0–8.3)

## 2013-09-12 LAB — UIFE/LIGHT CHAINS/TP QN, 24-HR UR
Albumin, U: DETECTED
Alpha 1, Urine: DETECTED — AB
Alpha 2, Urine: DETECTED — AB
BETA UR: DETECTED — AB
GAMMA UR: DETECTED — AB
Total Protein, Urine: 6 mg/dL (ref 5–25)

## 2013-09-12 LAB — COPPER, SERUM: Copper: 95 ug/dL (ref 70–175)

## 2013-09-12 LAB — CERULOPLASMIN: Ceruloplasmin: 25 mg/dL (ref 18–36)

## 2013-12-08 ENCOUNTER — Telehealth: Payer: Self-pay | Admitting: *Deleted

## 2013-12-08 NOTE — Telephone Encounter (Signed)
Patient called to cancel follow up appointment will call to reschedule later

## 2013-12-09 NOTE — Telephone Encounter (Signed)
Noted  

## 2013-12-11 ENCOUNTER — Ambulatory Visit: Payer: Medicare Other | Admitting: Neurology

## 2016-08-28 ENCOUNTER — Other Ambulatory Visit: Payer: Self-pay | Admitting: Internal Medicine

## 2016-08-28 DIAGNOSIS — R0989 Other specified symptoms and signs involving the circulatory and respiratory systems: Secondary | ICD-10-CM

## 2016-09-01 ENCOUNTER — Other Ambulatory Visit: Payer: Self-pay

## 2016-09-08 ENCOUNTER — Other Ambulatory Visit: Payer: Self-pay

## 2016-09-15 ENCOUNTER — Other Ambulatory Visit: Payer: Self-pay

## 2016-09-22 ENCOUNTER — Other Ambulatory Visit: Payer: Self-pay

## 2016-09-27 ENCOUNTER — Ambulatory Visit
Admission: RE | Admit: 2016-09-27 | Discharge: 2016-09-27 | Disposition: A | Discharge status: Home / Self Care | Payer: Medicare Other | Source: Ambulatory Visit | Attending: Internal Medicine | Admitting: Internal Medicine

## 2016-09-27 DIAGNOSIS — R0989 Other specified symptoms and signs involving the circulatory and respiratory systems: Secondary | ICD-10-CM

## 2018-08-06 ENCOUNTER — Encounter: Payer: Medicare Other | Attending: Physician Assistant | Admitting: Physician Assistant

## 2018-08-06 ENCOUNTER — Other Ambulatory Visit: Payer: Self-pay

## 2018-08-06 DIAGNOSIS — T23202A Burn of second degree of left hand, unspecified site, initial encounter: Secondary | ICD-10-CM | POA: Insufficient documentation

## 2018-08-06 DIAGNOSIS — M1990 Unspecified osteoarthritis, unspecified site: Secondary | ICD-10-CM | POA: Diagnosis not present

## 2018-08-06 DIAGNOSIS — Z886 Allergy status to analgesic agent status: Secondary | ICD-10-CM | POA: Insufficient documentation

## 2018-08-06 DIAGNOSIS — I890 Lymphedema, not elsewhere classified: Secondary | ICD-10-CM | POA: Insufficient documentation

## 2018-08-06 DIAGNOSIS — T25221A Burn of second degree of right foot, initial encounter: Secondary | ICD-10-CM | POA: Diagnosis not present

## 2018-08-06 DIAGNOSIS — Y9301 Activity, walking, marching and hiking: Secondary | ICD-10-CM | POA: Insufficient documentation

## 2018-08-06 DIAGNOSIS — L97812 Non-pressure chronic ulcer of other part of right lower leg with fat layer exposed: Secondary | ICD-10-CM | POA: Diagnosis not present

## 2018-08-06 DIAGNOSIS — Z888 Allergy status to other drugs, medicaments and biological substances status: Secondary | ICD-10-CM | POA: Insufficient documentation

## 2018-08-06 DIAGNOSIS — E11621 Type 2 diabetes mellitus with foot ulcer: Secondary | ICD-10-CM | POA: Insufficient documentation

## 2018-08-06 DIAGNOSIS — X19XXXA Contact with other heat and hot substances, initial encounter: Secondary | ICD-10-CM | POA: Insufficient documentation

## 2018-08-06 DIAGNOSIS — E1136 Type 2 diabetes mellitus with diabetic cataract: Secondary | ICD-10-CM | POA: Diagnosis not present

## 2018-08-06 DIAGNOSIS — H409 Unspecified glaucoma: Secondary | ICD-10-CM | POA: Diagnosis not present

## 2018-08-06 DIAGNOSIS — E1140 Type 2 diabetes mellitus with diabetic neuropathy, unspecified: Secondary | ICD-10-CM | POA: Insufficient documentation

## 2018-08-06 DIAGNOSIS — L97909 Non-pressure chronic ulcer of unspecified part of unspecified lower leg with unspecified severity: Secondary | ICD-10-CM | POA: Diagnosis present

## 2018-08-06 DIAGNOSIS — T25222A Burn of second degree of left foot, initial encounter: Secondary | ICD-10-CM | POA: Insufficient documentation

## 2018-08-06 DIAGNOSIS — I10 Essential (primary) hypertension: Secondary | ICD-10-CM | POA: Insufficient documentation

## 2018-08-06 NOTE — Progress Notes (Signed)
BOBY, EYER ([id number]) Visit Report for 08/06/2018 Abuse/Suicide Risk Screen Details [name], [name]. Date of Service: 08/06/2018 1:15 PM Medical Record Number: [id number] Patient Account Number: [id number] Date of Birth/Sex: 06-Jan-1941 (78 y.o. M) Treating RN: Army Melia Primary Care Jash Wahlen: Lavone Orn Other Clinician: Referring Desiray Orchard: Frederik Pear Treating Katheryn Culliton/Extender: Melburn Hake, HOYT Weeks in Treatment: 0 Abuse/Suicide Risk Screen Items Answer ABUSE RISK SCREEN: Has anyone close to you tried to hurt or harm you recentlyo No Do you feel uncomfortable with anyone in your familyo No Has anyone forced you do things that you didnot want to doo No Electronic Signature(s) Signed: 08/06/2018 3:25:15 PM By: Army Melia Entered By: Army Melia on 08/06/2018 13:25:41 [name], [name] ([id number]) -------------------------------------------------------------------------------- Activities of Daily Living Details [name], [name]. Date of Service: 08/06/2018 1:15 PM Medical Record Number: [id number] Patient Account Number: [id number] Date of Birth/Sex: May 30, 1940 (78 y.o. M) Treating RN: Army Melia Primary Care Sophiah Rolin: Lavone Orn Other Clinician: Referring Lauralynn Loeb: Frederik Pear Treating Bergen Melle/Extender: Melburn Hake, HOYT Weeks in Treatment: 0 Activities of Daily Living Items Answer Activities of Daily Living (Please select one for each item) Drive Automobile Completely Able Take Medications Completely Able Use Telephone Completely Able Care for Appearance Completely Able Use Toilet Completely Able Bath / Shower Completely Able Dress Self Completely Able Feed Self Completely Able Walk Completely Able Get In / Out Bed Completely Able Housework Completely Able Prepare Meals Completely [place] for Self Completely Able Electronic Signature(s) Signed: 08/06/2018 3:25:15 PM By: Army Melia Entered By:  Army Melia on 08/06/2018 13:26:02 [name] ([id number]) -------------------------------------------------------------------------------- Education Screening Details [name] Date of Service: 08/06/2018 1:15 PM Medical Record Number: [id number] Patient Account Number: [id number] Date of Birth/Sex: 02/09/1940 (78 y.o. M) Treating RN: Army Melia Primary Care Izekiel Flegel: Lavone Orn Other Clinician: Referring Shuayb Schepers: Frederik Pear Treating Mackenzie Lia/Extender: Melburn Hake, HOYT Weeks in Treatment: 0 Primary Learner Assessed: Patient Learning Preferences/Education Level/Primary Language Learning Preference: Explanation, Demonstration Highest Education Level: High School Preferred Language: English Cognitive Barrier Language Barrier: No Translator Needed: No Memory Deficit: No Emotional Barrier: No Cultural/Religious Beliefs Affecting Medical Care: No Physical Barrier Impaired Vision: No Impaired Hearing: No Decreased Hand dexterity: No Knowledge/Comprehension Knowledge Level: High Comprehension Level: High Ability to understand written High instructions: Ability to understand verbal High instructions: Motivation Anxiety Level: Calm Cooperation: Cooperative Education Importance: Acknowledges Need Interest in Health Problems: Asks Questions Perception: Coherent Willingness to Engage in Self- High Management Activities: Readiness to Engage in Self- High Management Activities: Electronic Signature(s) Signed: 08/06/2018 3:25:15 PM By: Army Melia Entered By: Army Melia on 08/06/2018 13:26:23 [name], [name] ([id number]) -------------------------------------------------------------------------------- Fall Risk Assessment Details [name]. Date of Service: 08/06/2018 1:15 PM Medical Record Number: [id number] Patient Account Number: [id number] Date of Birth/Sex: 09/21/1940 (78 y.o. M) Treating RN: Army Melia Primary Care  Montel Vanderhoof: Lavone Orn Other Clinician: Referring Serrena Linderman: Frederik Pear Treating Shahara Hartsfield/Extender: Melburn Hake, HOYT Weeks in Treatment: 0 Fall Risk Assessment Items Have you had 2 or more falls in the last 12 monthso 0 Yes Have you had any fall that resulted in injury in the last 12 monthso 0 No FALLS RISK SCREEN History of falling - immediate or within 3 months 0 No Secondary diagnosis (Do you have 2 or more medical diagnoseso) 0 No Ambulatory aid None/bed rest/wheelchair/nurse 0 No Crutches/cane/walker 15 Yes Furniture 0 No Intravenous therapy Access/Saline/Heparin Lock 0 No Gait/Transferring Normal/ bed rest/ wheelchair 0 No Weak (  short steps with or without shuffle, stooped but able to lift head while 0 No walking, may seek support from furniture) Impaired (short steps with shuffle, may have difficulty arising from chair, head 0 No down, impaired balance) Mental Status Oriented to own ability 0 No Electronic Signature(s) Signed: 08/06/2018 3:25:15 PM By: Army Melia Entered By: Army Melia on 08/06/2018 13:26:56 [name], [name] ([id number]) -------------------------------------------------------------------------------- Foot Assessment Details [name]. Date of Service: 08/06/2018 1:15 PM Medical Record Number: [id number] Patient Account Number: [id number] Date of Birth/Sex: 04-14-1940 (78 y.o. M) Treating RN: Army Melia Primary Care Kelvis Berger: Lavone Orn Other Clinician: Referring Krist Rosenboom: Frederik Pear Treating Bradlee Bridgers/Extender: Melburn Hake, HOYT Weeks in Treatment: 0 Foot Assessment Items Site Locations + = Sensation present, - = Sensation absent, C = Callus, U = Ulcer R = Redness, W = Warmth, M = Maceration, PU = Pre-ulcerative lesion F = Fissure, S = Swelling, D = Dryness Assessment Right: Left: Other Deformity: No No Prior Foot Ulcer: No No Prior Amputation: No No Charcot Joint: No No Ambulatory Status: Ambulatory Without Help Gait:  Steady Electronic Signature(s) Signed: 08/06/2018 3:25:15 PM By: Army Melia Entered By: Army Melia on 08/06/2018 13:27:26 [name], [name] ([id number]) -------------------------------------------------------------------------------- Nutrition Risk Screening Details [name]. Date of Service: 08/06/2018 1:15 PM Medical Record Number: [id number] Patient Account Number: [id number] Date of Birth/Sex: 1940/07/03 (78 y.o. M) Treating RN: Army Melia Primary Care Tasha Diaz: Lavone Orn Other Clinician: Referring Annjanette Wertenberger: Frederik Pear Treating Bradley Handyside/Extender: Melburn Hake, HOYT Weeks in Treatment: 0 Height (in): 69 Weight (lbs): 250 Body Mass Index (BMI): 36.9 Nutrition Risk Screening Items Score Screening NUTRITION RISK SCREEN: I have an illness or condition that made me change the kind and/or amount of 0 No food I eat I eat fewer than two meals per day 0 No I eat few fruits and vegetables, or milk products 0 No I have three or more drinks of beer, liquor or wine almost every day 0 No I have tooth or mouth problems that make it hard for me to eat 0 No I don't always have enough money to buy the food I need 0 No I eat alone most of the time 0 No I take three or more different prescribed or over-the-counter drugs a day 0 No Without wanting to, I have lost or gained 10 pounds in the last six months 0 No I am not always physically able to shop, cook and/or feed myself 0 No Nutrition Protocols Good Risk Protocol 0 No interventions needed Moderate Risk Protocol High Risk Proctocol Risk Level: Good Risk Score: 0 Electronic Signature(s) Signed: 08/06/2018 3:25:15 PM By: Army Melia Entered By: Army Melia on 08/06/2018 13:27:02
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
## 2018-08-07 NOTE — Progress Notes (Signed)
MASAI, KIDD ([id number]) Visit Report for 08/06/2018 Allergy List Details [name], [name]. Date of Service: 08/06/2018 1:15 PM Medical Record Number: [id number] Patient Account Number: [id number] Date of Birth/Sex: [birth year] (78 y.o. M) Treating RN: Army Melia Primary Care Denia Mcvicar: Lavone Orn Other Clinician: Referring Darren Caldron: Frederik Pear Treating Tylerjames Hoglund/Extender: STONE III, HOYT Weeks in Treatment: 0 Allergies Active Allergies Ambien Reaction: hallucinations Severity: Severe aspirin Reaction: bleeding Severity: Moderate Allergy Notes Electronic Signature(s) Signed: 08/06/2018 3:25:15 PM By: Army Melia Entered By: Army Melia on 08/06/2018 13:21:07 [name] ([id number]) -------------------------------------------------------------------------------- Arrival Information Details [name]. Date of Service: 08/06/2018 1:15 PM Medical Record Number: [id number] Patient Account Number: [id number] Date of Birth/Sex: 1940/04/18 (78 y.o. M) Treating RN: Montey Hora Primary Care Ellanora Rayborn: Lavone Orn Other Clinician: Referring Kamoria Lucien: Frederik Pear Treating Talley Kreiser/Extender: Melburn Hake, HOYT Weeks in Treatment: 0 Visit Information Patient Arrived: Wheel Chair Arrival Time: 13:12 Accompanied By: wife Transfer Assistance: None Patient Identification Verified: Yes Secondary Verification Process Completed: Yes Electronic Signature(s) Signed: 08/06/2018 4:24:52 PM By: Lorine Bears RCP, RRT, CHT Entered By: Lorine Bears on 08/06/2018 13:12:26 Candee, [name] ([id number]) -------------------------------------------------------------------------------- Clinic Level of Care Assessment Details Patient Name: NAVEN, GIAMBALVO. Date of Service: 08/06/2018 1:15 PM Medical Record Number: [id number] Patient Account Number: [id number] Date of Birth/Sex: 03/15/1940 (78 y.o. M) Treating RN: Montey Hora Primary Care Darina Hartwell: Lavone Orn Other Clinician: Referring Charna Neeb: Frederik Pear Treating Domonic Hiscox/Extender: Melburn Hake, HOYT Weeks in Treatment: 0 Clinic Level of Care Assessment Items TOOL 1 Quantity Score []  - Use when EandM and Procedure is performed on INITIAL visit 0 ASSESSMENTS - Nursing Assessment / Reassessment X - General Physical Exam (combine w/ comprehensive assessment (listed just below) when 1 20 performed on new pt. evals) X- 1 25 Comprehensive Assessment (HX, ROS, Risk Assessments, Wounds Hx, etc.) ASSESSMENTS - Wound and Skin Assessment / Reassessment []  - Dermatologic / Skin Assessment (not related to wound area) 0 ASSESSMENTS - Ostomy and/or Continence Assessment and Care []  - Incontinence Assessment and Management 0 []  - 0 Ostomy Care Assessment and Management (repouching, etc.) PROCESS - Coordination of Care X - Simple Patient / Family Education for ongoing care 1 15 []  - 0 Complex (extensive) Patient / Family Education for ongoing care X- 1 10 Staff obtains Programmer, systems, Records, Test Results / Process Orders []  - 0 Staff telephones HHA, Nursing Homes / Clarify orders / etc []  - 0 Routine Transfer to another Facility (non-emergent condition) []  - 0 Routine Hospital Admission (non-emergent condition) X- 1 15 New Admissions / Biomedical engineer / Ordering NPWT, Apligraf, etc. []  - 0 Emergency Hospital Admission (emergent condition) PROCESS - Special Needs []  - Pediatric / Minor Patient Management 0 []  - 0 Isolation Patient Management []  - 0 Hearing / Language / Visual special needs []  - 0 Assessment of Community assistance (transportation, D/C planning, etc.) []  - 0 Additional assistance / Altered mentation []  - 0 Support Surface(s) Assessment (bed, cushion, seat, etc.) SIGFREDO, SCHREIER ([id number]) INTERVENTIONS - Miscellaneous []  - External ear exam 0 []  - 0 Patient Transfer (multiple staff / Civil Service fast streamer / Similar devices) []  -  0 Simple Staple / Suture removal (25 or less) []  - 0 Complex Staple / Suture removal (26 or more) []  - 0 Hypo/Hyperglycemic Management (do not check if billed separately) X- 1 15 Ankle / Brachial Index (ABI) - do not check if billed separately Has the patient been seen at the hospital within the last three  years: Yes Total Score: 100 Level Of Care: New/Established - Level 3 Electronic Signature(s) Signed: 08/06/2018 4:49:07 PM By: Montey Hora Entered By: Montey Hora on 08/06/2018 14:31:16 Vanacker, [name] ([id number]) -------------------------------------------------------------------------------- Encounter Discharge Information Details [name]. Date of Service: 08/06/2018 1:15 PM Medical Record Number: [id number] Patient Account Number: [id number] Date of Birth/Sex: May 25, 1940 (78 y.o. M) Treating RN: Montey Hora Primary Care Keiondra Brookover: Lavone Orn Other Clinician: Referring Batya Citron: Frederik Pear Treating Claudie Brickhouse/Extender: Melburn Hake, HOYT Weeks in Treatment: 0 Encounter Discharge Information Items Discharge Condition: Stable Ambulatory Status: Wheelchair Discharge Destination: Home Transportation: Private Auto Accompanied By: spouse Schedule Follow-up Appointment: Yes Clinical Summary of Care: Electronic Signature(s) Signed: 08/06/2018 4:49:07 PM By: Montey Hora Entered By: Montey Hora on 08/06/2018 14:43:55 Storie, [name] ([id number]) -------------------------------------------------------------------------------- Lower Extremity Assessment Details [name]. Date of Service: 08/06/2018 1:15 PM Medical Record Number: [id number] Patient Account Number: [id number] Date of Birth/Sex: 05/25/1940 (78 y.o. M) Treating RN: Army Melia Primary Care Shahara Hartsfield: Lavone Orn Other Clinician: Referring Shanyla Marconi: Frederik Pear Treating Gauri Galvao/Extender: Melburn Hake, HOYT Weeks in Treatment: 0 Edema Assessment Assessed: [Left: No]  [Right: No] Edema: [Left: No] [Right: No] Calf Left: Right: Point of Measurement: 33 cm From Medial Instep 38 cm 38 cm Ankle Left: Right: Point of Measurement: 12 cm From Medial Instep 22 cm 22 cm Vascular Assessment Pulses: Dorsalis Pedis Palpable: [Left:Yes] [Right:Yes] Electronic Signature(s) Signed: 08/06/2018 3:25:15 PM By: Army Melia Entered By: Army Melia on 08/06/2018 13:54:29 Duguay, [name] ([id number]) -------------------------------------------------------------------------------- Multi Wound Chart Details [name]. Date of Service: 08/06/2018 1:15 PM Medical Record Number: [id number] Patient Account Number: [id number] Date of Birth/Sex: 1940-04-30 (78 y.o. M) Treating RN: Montey Hora Primary Care Samuel Rittenhouse: Lavone Orn Other Clinician: Referring Zykeria Laguardia: Frederik Pear Treating Jancarlos Thrun/Extender: Melburn Hake, HOYT Weeks in Treatment: 0 Vital Signs Height(in): 69 Pulse(bpm): 59 Weight(lbs): 250 Blood Pressure(mmHg): 133/73 Body Mass Index(BMI): 37 Temperature(F): 98.1 Respiratory Rate 16 (breaths/min): Photos: Wound Location: Left Foot - Plantar, Distal Left, Proximal, Plantar Foot Right Foot Wounding Event: Thermal Burn Thermal Burn Thermal Burn 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 the Diabetic Wound/Ulcer of the Lower Extremity Lower Extremity Lower Extremity Secondary Etiology: 2nd degree Burn 2nd degree Burn 2nd degree Burn Comorbid History: Cataracts, Glaucoma, Cataracts, Glaucoma, Cataracts, Glaucoma, Hypertension, Type II Hypertension, Type II Hypertension, Type II Diabetes, Osteoarthritis, Diabetes, Osteoarthritis, Diabetes, Osteoarthritis, Neuropathy Neuropathy Neuropathy Date Acquired: 07/29/2018 07/29/2018 07/29/2018 Weeks of Treatment: 0 0 0 Wound Status: Open Open Open Measurements L x W x D 4x5.7x0.2 5.5x3.2x0.1 0.1x0.1x0.1 (cm) Area (cm) : 17.907 13.823 0.008 Volume (cm) : 3.581  1.382 0.001 % Reduction in Area: 0.00% 0.00% 0.00% % Reduction in Volume: 0.00% 0.00% 0.00% Starting Position 1 8 (o'clock): Ending Position 1 3 (o'clock): Maximum Distance 1 (cm): 1.1 Starting Position 2 4 (o'clock): Ending Position 2 6 (o'clock): Maximum Distance 2 (cm): 0.8 Undermining: Yes No No HALEN, ANTENUCCI ([id number]) Classification: Grade 2 Grade 1 Grade 1 Exudate Amount: Medium None Present None Present Exudate Type: Serosanguineous N/A N/A Exudate Color: red, brown N/A N/A Wound Margin: Flat and Intact Flat and Intact Flat and Intact Granulation Amount: Medium (34-66%) None Present (0%) None Present (0%) Necrotic Amount: Medium (34-66%) Large (67-100%) Large (67-100%) Necrotic Tissue: N/A Eschar Eschar Exposed Structures: Fat Layer (Subcutaneous Fascia: No Fascia: No Tissue) Exposed: Yes Fat Layer (Subcutaneous Fat Layer (Subcutaneous Fascia: No Tissue) Exposed: No Tissue) Exposed: No Tendon: No Tendon: No Tendon: No Muscle: No Muscle:  No Muscle: No Joint: No Joint: No Joint: No Bone: No Bone: No Bone: No Limited to Skin Breakdown Limited to Skin Breakdown Epithelialization: None None None Debridement: Debridement - Selective/Open Debridement - Selective/Open N/A Wound Wound Pre-procedure 14:17 14:21 N/A Verification/Time Out Taken: Pain Control: Lidocaine 4% Topical Solution Lidocaine 4% Topical Solution N/A Tissue Debrided: Callus Callus N/A Level: Skin/Epidermis Skin/Epidermis N/A Debridement Area (sq cm): 22.8 17.6 N/A Instrument: Forceps, Scissors Forceps, Scissors N/A Bleeding: None None N/A Procedural Pain: 0 0 N/A Post Procedural Pain: 0 0 N/A Debridement Treatment Procedure was tolerated well Procedure was tolerated well N/A Response: Post Debridement 4x5.7x0.1 5.5x3.2x0.1 N/A Measurements L x W x D (cm) Post Debridement Volume: 1.791 1.382 N/A (cm) Procedures Performed: Debridement Debridement N/A Wound Number: 4 5 N/A Photos:  N/A Wound Location: Right Lower Leg - Anterior Right Lower Leg - Medial N/A Wounding Event: Trauma Trauma N/A 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 the N/A Lower Extremity Lower Extremity Secondary Etiology: Trauma, Other Trauma, Other N/A Comorbid History: Cataracts, Glaucoma, Cataracts, Glaucoma, N/A Hypertension, Type II Hypertension, Type II Diabetes, Osteoarthritis, Diabetes, Osteoarthritis, Neuropathy Neuropathy Date Acquired: 07/29/2018 07/29/2018 N/A YASMIN, DIBELLO ([id number]) Weeks of Treatment: 0 0 N/A Wound Status: Open Open N/A Measurements L x W x D 10x1.5x0 0.7x0.6x0.1 N/A (cm) Area (cm) : 11.781 0.33 N/A Volume (cm) : 1.178 0.033 N/A % Reduction in Area: 0.00% 0.00% N/A % Reduction in Volume: 0.00% 0.00% N/A Undermining: No No N/A Classification: Grade 1 Grade 1 N/A Exudate Amount: None Present None Present N/A Exudate Type: N/A N/A N/A Exudate Color: N/A N/A N/A Wound Margin: Flat and Intact Flat and Intact N/A Granulation Amount: None Present (0%) None Present (0%) N/A Necrotic Amount: Large (67-100%) Large (67-100%) N/A Necrotic Tissue: Eschar Eschar N/A Exposed Structures: Fascia: No Fascia: No N/A Fat Layer (Subcutaneous Fat Layer (Subcutaneous Tissue) Exposed: No Tissue) Exposed: No Tendon: No Tendon: No Muscle: No Muscle: No Joint: No Joint: No Bone: No Bone: No Limited to Skin Breakdown Limited to Skin Breakdown Epithelialization: None None N/A Debridement: N/A N/A N/A Pain Control: N/A N/A N/A Tissue Debrided: N/A N/A N/A Level: N/A N/A N/A Debridement Area (sq cm): N/A N/A N/A Instrument: N/A N/A N/A Bleeding: N/A N/A N/A Procedural Pain: N/A N/A N/A Post Procedural Pain: N/A N/A N/A Debridement Treatment N/A N/A N/A Response: Post Debridement N/A N/A N/A Measurements L x W x D (cm) Post Debridement Volume: N/A N/A N/A (cm) Procedures Performed: N/A N/A N/A Treatment Notes Electronic  Signature(s) Signed: 08/06/2018 4:49:07 PM By: Montey Hora Entered By: Montey Hora on 08/06/2018 14:26:16 Kary, [name] ([id number]) -------------------------------------------------------------------------------- Multi-Disciplinary Care Plan Details [name]. Date of Service: 08/06/2018 1:15 PM Medical Record Number: [id number] Patient Account Number: [id number] Date of Birth/Sex: 07/16/1940 (78 y.o. M) Treating RN: Montey Hora Primary Care Legend Pecore: Lavone Orn Other Clinician: Referring Akyra Bouchie: Frederik Pear Treating Leisha Trinkle/Extender: Melburn Hake, HOYT Weeks in Treatment: 0 Active Inactive Abuse / Safety / Falls / Self Care Management Nursing Diagnoses: Potential for falls Goals: Patient will not experience any injury related to falls Date Initiated: 08/06/2018 Target Resolution Date: 10/19/2018 Goal Status: Active Interventions: Assess fall risk on admission and as needed Notes: Orientation to the Wound Care Program Nursing Diagnoses: Knowledge deficit related to the wound healing center program Goals: Patient/caregiver will verbalize understanding of the [facility] Date Initiated: 08/06/2018 Target Resolution Date: 10/19/2018 Goal Status: Active Interventions: Provide education on orientation to the wound center  Notes: Peripheral Neuropathy Nursing Diagnoses: Knowledge deficit related to disease process and management of peripheral neurovascular dysfunction Goals: Patient/caregiver will verbalize understanding of disease process and disease management Date Initiated: 08/06/2018 Target Resolution Date: 10/19/2018 Goal Status: Active Interventions: Provide education on Management of Neuropathy and Related Ulcers QUINCEY, QUESINBERRY ([id number]) Notes: Wound/Skin Impairment Nursing Diagnoses: Impaired tissue integrity Goals: Ulcer/skin breakdown will heal within 14 weeks Date Initiated: 08/06/2018 Target Resolution Date:  10/19/2018 Goal Status: Active Interventions: Assess patient/caregiver ability to obtain necessary supplies Assess patient/caregiver ability to perform ulcer/skin care regimen upon admission and as needed Assess ulceration(s) every visit Notes: Electronic Signature(s) Signed: 08/06/2018 4:49:07 PM By: Montey Hora Entered By: Montey Hora on 08/06/2018 14:07:31 Inclan, [name] ([id number]) -------------------------------------------------------------------------------- Pain Assessment Details [name]. Date of Service: 08/06/2018 1:15 PM Medical Record Number: [id number] Patient Account Number: [id number] Date of Birth/Sex: 1940-07-03 (78 y.o. M) Treating RN: Montey Hora Primary Care Rosie Torrez: Lavone Orn Other Clinician: Referring Omero Kowal: Frederik Pear Treating Daichi Moris/Extender: Melburn Hake, HOYT Weeks in Treatment: 0 Active Problems Location of Pain Severity and Description of Pain Patient Has Paino No Site Locations Pain Management and Medication Current Pain Management: Electronic Signature(s) Signed: 08/06/2018 4:24:52 PM By: Paulla Fore, RRT, CHT Signed: 08/06/2018 4:49:07 PM By: Montey Hora Entered By: Lorine Bears on 08/06/2018 13:12:43 Brining, [name] ([id number]) -------------------------------------------------------------------------------- Patient/Caregiver Education Details [name] Date of Service: 08/06/2018 1:15 PM Medical Record Number: [id number] Patient Account Number: [id number] Date of Birth/Gender: May 30, 1940 (78 y.o. M) Treating RN: Montey Hora Primary Care Physician: Lavone Orn Other Clinician: Referring Physician: Frederik Pear Treating Physician/Extender: Sharalyn Ink in Treatment: 0 Education Assessment Education Provided To: Patient and Caregiver Education Topics Provided Peripheral Neuropathy: Handouts: Other: protect yourself from  injury Methods: Explain/Verbal Responses: State content correctly Wound/Skin Impairment: Handouts: Other: wound care as ordered Methods: Demonstration, Explain/Verbal Responses: State content correctly Electronic Signature(s) Signed: 08/06/2018 4:49:07 PM By: Montey Hora Entered By: Montey Hora on 08/06/2018 14:40:31 Holquin, [name] ([id number]) -------------------------------------------------------------------------------- Wound Assessment Details [name]. Date of Service: 08/06/2018 1:15 PM Medical Record Number: [id number] Patient Account Number: [id number] Date of Birth/Sex: 1940-03-10 (78 y.o. M) Treating RN: Army Melia Primary Care Eileene Kisling: Lavone Orn Other Clinician: Referring [place] Fassnacht: Frederik Pear Treating Julianny Milstein/Extender: STONE III, HOYT Weeks in Treatment: 0 Wound Status Wound Number: 1 Primary Diabetic Wound/Ulcer of the Lower Extremity Etiology: Wound Location: Left Foot - Plantar, Distal Secondary 2nd degree Burn Wounding Event: Thermal Burn Etiology: Date Acquired: 07/29/2018 Wound Status: Open Weeks Of Treatment: 0 Comorbid Cataracts, Glaucoma, Hypertension, Type II Clustered Wound: No History: Diabetes, Osteoarthritis, Neuropathy Photos Wound Measurements Length: (cm) 4 % Reduction i Width: (cm) 5.7 % Reduction i Depth: (cm) 0.2 Epithelializa Area: (cm) 17.907 Tunneling: Volume: (cm) 3.581 Undermining: Location 1 Startin Ending Maximum Location 2 Startin Ending Maximum n Area: 0% n Volume: 0% tion: None No Yes g Position (o'clock): 8 Position (o'clock): 3 Distance: (cm) 1.1 g Position (o'clock): 4 Position (o'clock): 6 Distance: (cm) 0.8 Wound Description Classification: Grade 2 Foul Odor Af Wound Margin: Flat and Intact Slough/Fibri Exudate Amount: Medium Exudate Type: Serosanguineous Exudate Color: red, brown ter Cleansing: No no Yes Wound Bed KHANG, HANNUM. ([id number]) Granulation Amount:  Medium (34-66%) Exposed Structure Necrotic Amount: Medium (34-66%) Fascia Exposed: No Fat Layer (Subcutaneous Tissue) Exposed: Yes Tendon Exposed: No Muscle Exposed: No Joint Exposed: No Bone Exposed: No Treatment Notes Wound #1 (Left, Distal, Plantar Foot) Notes silvadene, gauze,  abd and conform Electronic Signature(s) Signed: 08/06/2018 3:25:15 PM By: Army Melia Signed: 08/06/2018 4:49:07 PM By: Montey Hora Entered By: Montey Hora on 08/06/2018 14:16:14 Vazques, [name] ([id number]) -------------------------------------------------------------------------------- Wound Assessment Details Patient Name: ARMOND, CUTHRELL. Date of Service: 08/06/2018 1:15 PM Medical Record Number: [id number] Patient Account Number: [id number] Date of Birth/Sex: 04-09-1940 (78 y.o. M) Treating RN: Montey Hora Primary Care Liticia Gasior: Lavone Orn Other Clinician: Referring Jayleana Colberg: Frederik Pear Treating Hrithik Boschee/Extender: STONE III, HOYT Weeks in Treatment: 0 Wound Status Wound Number: 2 Primary Diabetic Wound/Ulcer of the Lower Extremity Etiology: Wound Location: Left, Proximal, Plantar Foot Secondary 2nd degree Burn Wounding Event: Thermal Burn Etiology: Date Acquired: 07/29/2018 Wound Status: Open Weeks Of Treatment: 0 Comorbid Cataracts, Glaucoma, Hypertension, Type II Clustered Wound: No History: Diabetes, Osteoarthritis, Neuropathy Photos Wound Measurements Length: (cm) 5.5 % Reduction Width: (cm) 3.2 % Reduction Depth: (cm) 0.1 Epithelializ Area: (cm) 13.823 Tunneling: Volume: (cm) 1.382 Undermining in Area: 0% in Volume: 0% ation: None No : No Wound Description Classification: Grade 1 Foul Odor Af Wound Margin: Flat and Intact Slough/Fibri Exudate Amount: None Present ter Cleansing: No no Yes Wound Bed Granulation Amount: None Present (0%) Exposed Structure Necrotic Amount: Large (67-100%) Fascia Exposed: No Necrotic Quality: Eschar Fat Layer (Subcutaneous  Tissue) Exposed: No Tendon Exposed: No Muscle Exposed: No Joint Exposed: No Bone Exposed: No Limited to Skin Breakdown Treatment Notes Wound #2 (Left, Proximal, Plantar Foot) Oshana, [name] ([id number]) Notes silvadene, gauze, abd and conform Electronic Signature(s) Signed: 08/06/2018 4:49:07 PM By: Montey Hora Entered By: Montey Hora on 08/06/2018 14:26:02 [name] ([id number]) -------------------------------------------------------------------------------- Wound Assessment Details [name]. Date of Service: 08/06/2018 1:15 PM Medical Record Number: [id number] Patient Account Number: [id number] Date of Birth/Sex: 11-Jul-1940 (78 y.o. M) Treating RN: Army Melia Primary Care Banyan Goodchild: Lavone Orn Other Clinician: Referring Tammi Boulier: Frederik Pear Treating Jade Burkard/Extender: STONE III, HOYT Weeks in Treatment: 0 Wound Status Wound Number: 3 Primary Diabetic Wound/Ulcer of the Lower Extremity Etiology: Wound Location: Right Foot Secondary 2nd degree Burn Wounding Event: Thermal Burn Etiology: Date Acquired: 07/29/2018 Wound Status: Open Weeks Of Treatment: 0 Comorbid Cataracts, Glaucoma, Hypertension, Type II Clustered Wound: No History: Diabetes, Osteoarthritis, Neuropathy Photos Wound Measurements Length: (cm) 0.1 % Reduction i Width: (cm) 0.1 % Reduction i Depth: (cm) 0.1 Epithelializa Area: (cm) 0.008 Tunneling: Volume: (cm) 0.001 Undermining: n Area: 0% n Volume: 0% tion: None No No Wound Description Classification: Grade 1 Foul Odor Aft Wound Margin: Flat and Intact Slough/Fibrin Exudate Amount: None Present er Cleansing: No o Yes Wound Bed Granulation Amount: None Present (0%) Exposed Structure Necrotic Amount: Large (67-100%) Fascia Exposed: No Necrotic Quality: Eschar Fat Layer (Subcutaneous Tissue) Exposed: No Tendon Exposed: No Muscle Exposed: No Joint Exposed: No Bone Exposed: No Limited to Skin  Breakdown Electronic Signature(s) Signed: 08/06/2018 3:25:15 PM By: Helmut Muster ([id number]) Signed: 08/06/2018 4:49:07 PM By: Montey Hora Entered By: Montey Hora on 08/06/2018 14:17:20 Liller, [name] ([id number]) -------------------------------------------------------------------------------- Wound Assessment Details [name]. Date of Service: 08/06/2018 1:15 PM Medical Record Number: [id number] Patient Account Number: [id number] Date of Birth/Sex: [birth year] (78 y.o. M) Treating RN: Army Melia Primary Care Leeya Rusconi: Lavone Orn Other Clinician: Referring Eithen Castiglia: Frederik Pear Treating Madgie Dhaliwal/Extender: STONE III, HOYT Weeks in Treatment: 0 Wound Status Wound Number: 4 Primary Diabetic Wound/Ulcer of the Lower Extremity Etiology: Wound Location: Right Lower Leg - Anterior Secondary Trauma, Other Wounding Event: Trauma Etiology: Date Acquired: 07/29/2018 Wound Status: Open Weeks Of Treatment:  0 Comorbid Cataracts, Glaucoma, Hypertension, Type II Clustered Wound: No History: Diabetes, Osteoarthritis, Neuropathy Photos Wound Measurements Length: (cm) 10 % Reduction i Width: (cm) 1.5 % Reduction i Depth: (cm) 0 Epithelializa Area: (cm) 11.781 Tunneling: Volume: (cm) 1.178 Undermining: n Area: 0% n Volume: 0% tion: None No No Wound Description Classification: Grade 1 Foul Odor Aft Wound Margin: Flat and Intact Slough/Fibrin Exudate Amount: None Present er Cleansing: No o Yes Wound Bed Granulation Amount: None Present (0%) Exposed Structure Necrotic Amount: Large (67-100%) Fascia Exposed: No Necrotic Quality: Eschar Fat Layer (Subcutaneous Tissue) Exposed: No Tendon Exposed: No Muscle Exposed: No Joint Exposed: No Bone Exposed: No Limited to Skin Breakdown Treatment Notes Wound #4 (Right, Anterior Lower Leg) TIMBER, LUCARELLI ([id number]) Notes betadine paint Electronic Signature(s) Signed: 08/06/2018 3:25:15  PM By: Army Melia Signed: 08/06/2018 4:49:07 PM By: Montey Hora Entered By: Montey Hora on 08/06/2018 14:18:15 Lukas, [name] ([id number]) -------------------------------------------------------------------------------- Wound Assessment Details [name]. Date of Service: 08/06/2018 1:15 PM Medical Record Number: [id number] Patient Account Number: [id number] Date of Birth/Sex: 03-01-1940 (78 y.o. M) Treating RN: Army Melia Primary Care Claudis Giovanelli: Lavone Orn Other Clinician: Referring Trellis Vanoverbeke: Frederik Pear Treating Charlyn Vialpando/Extender: Melburn Hake, HOYT Weeks in Treatment: 0 Wound Status Wound Number: 5 Primary Diabetic Wound/Ulcer of the Lower Extremity Etiology: Wound Location: Right Lower Leg - Medial Secondary Trauma, Other Wounding Event: Trauma Etiology: Date Acquired: 07/29/2018 Wound Status: Open Weeks Of Treatment: 0 Comorbid Cataracts, Glaucoma, Hypertension, Type II Clustered Wound: No History: Diabetes, Osteoarthritis, Neuropathy Photos Wound Measurements Length: (cm) 0.7 % Reduction i Width: (cm) 0.6 % Reduction i Depth: (cm) 0.1 Epithelializa Area: (cm) 0.33 Tunneling: Volume: (cm) 0.033 Undermining: n Area: 0% n Volume: 0% tion: None No No Wound Description Classification: Grade 1 Foul Odor Aft Wound Margin: Flat and Intact Slough/Fibrin Exudate Amount: None Present er Cleansing: No o Yes Wound Bed Granulation Amount: None Present (0%) Exposed Structure Necrotic Amount: Large (67-100%) Fascia Exposed: No Necrotic Quality: Eschar Fat Layer (Subcutaneous Tissue) Exposed: No Tendon Exposed: No Muscle Exposed: No Joint Exposed: No Bone Exposed: No Limited to Skin Breakdown Treatment Notes Wound #5 (Right, Medial Lower Leg) CASEN, PRYOR ([id number]) Notes betadine paint Electronic Signature(s) Signed: 08/06/2018 3:25:15 PM By: Army Melia Signed: 08/06/2018 4:49:07 PM By: Montey Hora Entered By: Montey Hora  on 08/06/2018 14:18:32 Wirtz, [name] ([id number]) -------------------------------------------------------------------------------- Vitals Details [name]. Date of Service: 08/06/2018 1:15 PM Medical Record Number: [id number] Patient Account Number: [id number] Date of Birth/Sex: Dec 14, 1940 (78 y.o. M) Treating RN: Montey Hora Primary Care Jumanah Hynson: Lavone Orn Other Clinician: Referring Tukker Byrns: Frederik Pear Treating Ericia Moxley/Extender: Melburn Hake, HOYT Weeks in Treatment: 0 Vital Signs Time Taken: 13:12 Temperature (F): 98.1 Height (in): 69 Pulse (bpm): 59 Source: Stated Respiratory Rate (breaths/min): 16 Weight (lbs): 250 Blood Pressure (mmHg): 133/73 Source: Stated Reference Range: 80 - 120 mg / dl Body Mass Index (BMI): 36.9 Electronic Signature(s) Signed: 08/06/2018 4:24:52 PM By: Lorine Bears RCP, RRT, CHT Entered By: Lorine Bears on 08/06/2018 13:15:45

## 2018-08-13 ENCOUNTER — Encounter: Payer: Medicare Other | Attending: Physician Assistant | Admitting: Physician Assistant

## 2018-08-13 ENCOUNTER — Other Ambulatory Visit: Payer: Self-pay

## 2018-08-13 DIAGNOSIS — T23001A Burn of unspecified degree of right hand, unspecified site, initial encounter: Secondary | ICD-10-CM | POA: Insufficient documentation

## 2018-08-13 DIAGNOSIS — H409 Unspecified glaucoma: Secondary | ICD-10-CM | POA: Insufficient documentation

## 2018-08-13 DIAGNOSIS — I890 Lymphedema, not elsewhere classified: Secondary | ICD-10-CM | POA: Diagnosis not present

## 2018-08-13 DIAGNOSIS — T25022A Burn of unspecified degree of left foot, initial encounter: Secondary | ICD-10-CM | POA: Diagnosis not present

## 2018-08-13 DIAGNOSIS — M1990 Unspecified osteoarthritis, unspecified site: Secondary | ICD-10-CM | POA: Diagnosis not present

## 2018-08-13 DIAGNOSIS — W010XXA Fall on same level from slipping, tripping and stumbling without subsequent striking against object, initial encounter: Secondary | ICD-10-CM | POA: Insufficient documentation

## 2018-08-13 DIAGNOSIS — E11621 Type 2 diabetes mellitus with foot ulcer: Secondary | ICD-10-CM | POA: Diagnosis present

## 2018-08-13 DIAGNOSIS — E1151 Type 2 diabetes mellitus with diabetic peripheral angiopathy without gangrene: Secondary | ICD-10-CM | POA: Insufficient documentation

## 2018-08-13 DIAGNOSIS — T23002A Burn of unspecified degree of left hand, unspecified site, initial encounter: Secondary | ICD-10-CM | POA: Insufficient documentation

## 2018-08-13 DIAGNOSIS — E1136 Type 2 diabetes mellitus with diabetic cataract: Secondary | ICD-10-CM | POA: Diagnosis not present

## 2018-08-13 DIAGNOSIS — I10 Essential (primary) hypertension: Secondary | ICD-10-CM | POA: Insufficient documentation

## 2018-08-13 DIAGNOSIS — L97812 Non-pressure chronic ulcer of other part of right lower leg with fat layer exposed: Secondary | ICD-10-CM | POA: Diagnosis not present

## 2018-08-13 DIAGNOSIS — E1142 Type 2 diabetes mellitus with diabetic polyneuropathy: Secondary | ICD-10-CM | POA: Insufficient documentation

## 2018-08-13 NOTE — Progress Notes (Addendum)
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 Use: Never; Drug Use: No History; Caffeine Use: Moderate; Financial Concerns: No; Food, Clothing or Shelter Needs: No; Support System Lacking: No; Transportation Concerns: No Physician Affirmation I have reviewed  and agree with the above information. Electronic Signature(s) Signed: 08/13/2018 4:46:21 PM By: Montey Hora Signed: 08/13/2018 9:00:41 PM By: Worthy Keeler PA-C Entered By: Worthy Keeler on 08/13/2018 13:09:19 [name], [name] ([id number]) -------------------------------------------------------------------------------- SuperBill Details [name] Date of Service: 08/13/2018 Medical Record Number: [id number] Patient Account Number: [id number] Date of Birth/Sex: 1940-03-23 (78 y.o. 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 2 diabetes mellitus with diabetic neuropathy, unspecified T23.202A Burn of second degree of left hand, unspecified site, initial encounter I89.0 Lymphedema, not elsewhere classified L97.812 Non-pressure chronic ulcer of other part of right lower leg with fat layer exposed Physician Procedures CPT4 Code: [id number] Description: 99214 - WC PHYS LEVEL 4 - EST PT ICD-10 Diagnosis Description T25.221A Burn of second degree of right foot, initial encounter T25.222A Burn of second degree of left foot, initial encounter E11.621 Type 2 diabetes mellitus with foot ulcer  E11.40 Type 2 diabetes mellitus with diabetic neuropathy, unspecif Modifier: ied Quantity: 1 Electronic Signature(s) Signed: 08/13/2018 1:10:52 PM By: Worthy Keeler PA-C Entered By: Worthy Keeler on 08/13/2018 13:10:52

## 2018-08-14 NOTE — Progress Notes (Signed)
LORIE, MELICHAR ([id number]) Visit Report for 08/13/2018 Arrival Information Details [name], [name]. Date of Service: 08/13/2018 12:30 PM Medical Record Number: [id number] Patient Account Number: [id number] Date of Birth/Sex: [birth year] (78 y.o. M) Treating RN: Army Melia Primary Care Lizzie An: Lavone Orn Other Clinician: Referring Lela Murfin: Lavone Orn Treating Thorin Starner/Extender: Melburn Hake, HOYT Weeks in Treatment: 1 Visit Information History Since Last Visit Added or deleted any medications: No Patient Arrived: Cane Any new allergies or adverse reactions: No Arrival Time: 12:34 Had a fall or experienced change in No Accompanied By: wife activities of daily living that may affect Transfer Assistance: None risk of falls: Signs or symptoms of abuse/neglect since last visito No Hospitalized since last visit: No Has Dressing in Place as Prescribed: Yes Pain Present Now: No Electronic Signature(s) Signed: 08/13/2018 1:06:05 PM By: Army Melia Entered By: Army Melia on 08/13/2018 12:35:04 [name] ([id number]) -------------------------------------------------------------------------------- Clinic Level of Care Assessment Details [name] Date of Service: 08/13/2018 12:30 PM Medical Record Number: [id number] Patient Account Number: [id number] Date of Birth/Sex: 1940-09-23 (78 y.o. M) Treating RN: Montey Hora Primary Care Kolbie Lepkowski: Lavone Orn Other Clinician: Referring Karisha Marlin: Lavone Orn Treating Kortny Lirette/Extender: Melburn Hake, HOYT Weeks in Treatment: 1 Clinic Level of Care Assessment Items TOOL 4 Quantity Score []  - Use when only an EandM is performed on FOLLOW-UP visit 0 ASSESSMENTS - Nursing Assessment / Reassessment X - Reassessment of Co-morbidities (includes updates in patient status) 1 10 X- 1 5 Reassessment of Adherence to Treatment Plan ASSESSMENTS - Wound and Skin Assessment / Reassessment []  - Simple Wound  Assessment / Reassessment - one wound 0 X- 5 5 Complex Wound Assessment / Reassessment - multiple wounds []  - 0 Dermatologic / Skin Assessment (not related to wound area) ASSESSMENTS - Focused Assessment []  - Circumferential Edema Measurements - multi extremities 0 []  - 0 Nutritional Assessment / Counseling / Intervention X- 1 5 Lower Extremity Assessment (monofilament, tuning fork, pulses) []  - 0 Peripheral Arterial Disease Assessment (using hand held doppler) ASSESSMENTS - Ostomy and/or Continence Assessment and Care []  - Incontinence Assessment and Management 0 []  - 0 Ostomy Care Assessment and Management (repouching, etc.) PROCESS - Coordination of Care X - Simple Patient / Family Education for ongoing care 1 15 []  - 0 Complex (extensive) Patient / Family Education for ongoing care X- 1 10 Staff obtains Programmer, systems, Records, Test Results / Process Orders []  - 0 Staff telephones HHA, Nursing Homes / Clarify orders / etc []  - 0 Routine Transfer to another Facility (non-emergent condition) []  - 0 Routine Hospital Admission (non-emergent condition) []  - 0 New Admissions / Biomedical engineer / Ordering NPWT, Apligraf, etc. []  - 0 Emergency Hospital Admission (emergent condition) X- 1 10 Simple Discharge Coordination JEANETTE, MOFFATT ([id number]) []  - 0 Complex (extensive) Discharge Coordination PROCESS - Special Needs []  - Pediatric / Minor Patient Management 0 []  - 0 Isolation Patient Management []  - 0 Hearing / Language / Visual special needs []  - 0 Assessment of Community assistance (transportation, D/C planning, etc.) []  - 0 Additional assistance / Altered mentation []  - 0 Support Surface(s) Assessment (bed, cushion, seat, etc.) INTERVENTIONS - Wound Cleansing / Measurement []  - Simple Wound Cleansing - one wound 0 X- 5 5 Complex Wound Cleansing - multiple wounds X- 1 5 Wound Imaging (photographs - any number of wounds) []  - 0 Wound Tracing (instead of  photographs) []  - 0 Simple Wound Measurement - one wound X- 5 5 Complex Wound Measurement -  multiple wounds INTERVENTIONS - Wound Dressings X - Small Wound Dressing one or multiple wounds 4 10 []  - 0 Medium Wound Dressing one or multiple wounds []  - 0 Large Wound Dressing one or multiple wounds X- 1 5 Application of Medications - topical []  - 0 Application of Medications - injection INTERVENTIONS - Miscellaneous []  - External ear exam 0 []  - 0 Specimen Collection (cultures, biopsies, blood, body fluids, etc.) []  - 0 Specimen(s) / Culture(s) sent or taken to Lab for analysis []  - 0 Patient Transfer (multiple staff / Civil Service fast streamer / Similar devices) []  - 0 Simple Staple / Suture removal (25 or less) []  - 0 Complex Staple / Suture removal (26 or more) []  - 0 Hypo / Hyperglycemic Management (close monitor of Blood Glucose) []  - 0 Ankle / Brachial Index (ABI) - do not check if billed separately X- 1 5 Vital Signs Sahakian, ALRICK CUBBAGE ([id number]) Has the patient been seen at the hospital within the last three years: Yes Total Score: 185 Level Of Care: New/Established - Level 5 Electronic Signature(s) Signed: 08/13/2018 4:46:21 PM By: Montey Hora Entered By: Montey Hora on 08/13/2018 13:04:33 [name] ([id number]) -------------------------------------------------------------------------------- Encounter Discharge Information Details [name]. Date of Service: 08/13/2018 12:30 PM Medical Record Number: [id number] Patient Account Number: [id number] Date of Birth/Sex: Mar 22, 1940 (78 y.o. M) Treating RN: Montey Hora Primary Care Aissa Lisowski: Lavone Orn Other Clinician: Referring Soha Thorup: Lavone Orn Treating Huy Majid/Extender: Melburn Hake, HOYT Weeks in Treatment: 1 Encounter Discharge Information Items Discharge Condition: Stable Ambulatory Status: Cane Discharge Destination: Home Transportation: Private Auto Accompanied By: wife Schedule  Follow-up Appointment: Yes Clinical Summary of Care: Electronic Signature(s) Signed: 08/13/2018 4:46:21 PM By: Montey Hora Entered By: Montey Hora on 08/13/2018 13:06:06 [name] ([id number]) -------------------------------------------------------------------------------- Lower Extremity Assessment Details [name]. Date of Service: 08/13/2018 12:30 PM Medical Record Number: [id number] Patient Account Number: [id number] Date of Birth/Sex: 06-05-1940 (78 y.o. M) Treating RN: Army Melia Primary Care Sulo Janczak: Lavone Orn Other Clinician: Referring Michaeal Davis: Lavone Orn Treating Sarahlynn Cisnero/Extender: STONE III, HOYT Weeks in Treatment: 1 Edema Assessment Assessed: [Left: No] [Right: No] Edema: [Left: No] [Right: No] Vascular Assessment Pulses: Dorsalis Pedis Palpable: [Left:Yes] [Right:Yes] Electronic Signature(s) Signed: 08/13/2018 1:06:05 PM By: Army Melia Entered By: Army Melia on 08/13/2018 12:46:26 Lipke, [name] ([id number]) -------------------------------------------------------------------------------- Multi Wound Chart Details [name]. Date of Service: 08/13/2018 12:30 PM Medical Record Number: [id number] Patient Account Number: [id number] Date of Birth/Sex: 04-Feb-1940 (78 y.o. M) Treating RN: Montey Hora Primary Care Disha Cottam: Lavone Orn Other Clinician: Referring January Bergthold: Lavone Orn Treating Karo Rog/Extender: Melburn Hake, HOYT Weeks in Treatment: 1 Vital Signs Height(in): 69 Pulse(bpm): 94 Weight(lbs): 250 Blood Pressure(mmHg): 130/80 Body Mass Index(BMI): 37 Temperature(F): 99.1 Respiratory Rate 16 (breaths/min): Photos: Wound Location: Left Foot - Plantar, Distal Left Foot - Plantar, Proximal Right Foot Wounding Event: Thermal Burn Thermal Burn Thermal Burn 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 the Diabetic Wound/Ulcer of the Lower Extremity Lower Extremity Lower  Extremity Secondary Etiology: 2nd degree Burn 2nd degree Burn 2nd degree Burn Comorbid History: Cataracts, Glaucoma, Cataracts, Glaucoma, Cataracts, Glaucoma, Hypertension, Type II Hypertension, Type II Hypertension, Type II Diabetes, Osteoarthritis, Diabetes, Osteoarthritis, Diabetes, Osteoarthritis, Neuropathy Neuropathy Neuropathy Date Acquired: 07/29/2018 07/29/2018 07/29/2018 Weeks of Treatment: 1 1 1  Wound Status: Open Open Healed - Epithelialized Measurements L x W x D 2x4x0.1 1x1.3x0.1 0x0x0 (cm) Area (cm) : 6.283 1.021 0 Volume (cm) : 0.628 0.102 0 % Reduction in Area:  64.90% 92.60% 100.00% % Reduction in Volume: 82.50% 92.60% 100.00% Classification: Grade 2 Grade 1 Grade 1 Exudate Amount: Medium None Present None Present Exudate Type: Serosanguineous N/A N/A Exudate Color: red, brown N/A N/A Wound Margin: Flat and Intact Flat and Intact Flat and Intact Granulation Amount: Medium (34-66%) Medium (34-66%) None Present (0%) Necrotic Amount: Medium (34-66%) Medium (34-66%) Large (67-100%) Necrotic Tissue: Eschar, Adherent Slough Eschar, Adherent Slough Eschar Exposed Structures: Fat Layer (Subcutaneous Fascia: No Fascia: No Tissue) Exposed: Yes Fat Layer (Subcutaneous Fat Layer (Subcutaneous Fascia: No Tissue) Exposed: No Tissue) Exposed: No TASHAUN, OBEY ([id number]) Tendon: No Tendon: No Tendon: No Muscle: No Muscle: No Muscle: No Joint: No Joint: No Joint: No Bone: No Bone: No Bone: No Limited to Skin Breakdown Limited to Skin Breakdown Epithelialization: None None None Wound Number: 4 5 N/A Photos: N/A Wound Location: Right Lower Leg - Anterior Right Lower Leg - Medial N/A Wounding Event: Trauma Trauma N/A 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 the N/A Lower Extremity Lower Extremity Secondary Etiology: Trauma, Other Trauma, Other N/A Comorbid History: Cataracts, Glaucoma, Cataracts, Glaucoma, N/A Hypertension, Type II  Hypertension, Type II Diabetes, Osteoarthritis, Diabetes, Osteoarthritis, Neuropathy Neuropathy Date Acquired: 07/29/2018 07/29/2018 N/A Weeks of Treatment: 1 1 N/A Wound Status: Open Open N/A Measurements L x W x D 10x1.5x0 0.1x0.1x0.1 N/A (cm) Area (cm) : 11.781 0.008 N/A Volume (cm) : 1.178 0.001 N/A % Reduction in Area: 0.00% 97.60% N/A % Reduction in Volume: 0.00% 97.00% N/A Classification: Grade 1 Grade 1 N/A Exudate Amount: None Present None Present N/A Exudate Type: N/A N/A N/A Exudate Color: N/A N/A N/A Wound Margin: Flat and Intact Flat and Intact N/A Granulation Amount: None Present (0%) None Present (0%) N/A Necrotic Amount: Large (67-100%) Large (67-100%) N/A Necrotic Tissue: Eschar Eschar N/A Exposed Structures: Fascia: No Fascia: No N/A Fat Layer (Subcutaneous Fat Layer (Subcutaneous Tissue) Exposed: No Tissue) Exposed: No Tendon: No Tendon: No Muscle: No Muscle: No Joint: No Joint: No Bone: No Bone: No Limited to Skin Breakdown Limited to Skin Breakdown Epithelialization: None Medium (34-66%) N/A Treatment Notes Electronic Signature(s) Signed: 08/13/2018 4:46:21 PM By: Livia Snellen ([id number]) Entered By: Montey Hora on 08/13/2018 13:02:10 LOREN, SAWAYA ([id number]) -------------------------------------------------------------------------------- Multi-Disciplinary Care Plan Details Patient Name: TERRALL, BLEY. Date of Service: 08/13/2018 12:30 PM Medical Record Number: [id number] Patient Account Number: [id number] Date of Birth/Sex: 1940-02-05 (78 y.o. M) Treating RN: Montey Hora Primary Care Zafira Munos: Lavone Orn Other Clinician: Referring Yussef Jorge: Lavone Orn Treating Delmont Prosch/Extender: Melburn Hake, HOYT Weeks in Treatment: 1 Active Inactive Abuse / Safety / Falls / Self Care Management Nursing Diagnoses: Potential for falls Goals: Patient will not experience any injury related to falls Date Initiated:  08/06/2018 Target Resolution Date: 10/19/2018 Goal Status: Active Interventions: Assess fall risk on admission and as needed Notes: Orientation to the Wound Care Program Nursing Diagnoses: Knowledge deficit related to the wound healing center program Goals: Patient/caregiver will verbalize understanding of the [facility] Program Date Initiated: 08/06/2018 Target Resolution Date: 10/19/2018 Goal Status: Active Interventions: Provide education on orientation to the wound center Notes: Peripheral Neuropathy Nursing Diagnoses: Knowledge deficit related to disease process and management of peripheral neurovascular dysfunction Goals: Patient/caregiver will verbalize understanding of disease process and disease management Date Initiated: 08/06/2018 Target Resolution Date: 10/19/2018 Goal Status: Active Interventions: Provide education on Management of Neuropathy and Related Ulcers JERARD, BAYS ([id number]) Notes: Wound/Skin Impairment Nursing Diagnoses: Impaired tissue integrity Goals: Ulcer/skin breakdown will heal within 14  weeks Date Initiated: 08/06/2018 Target Resolution Date: 10/19/2018 Goal Status: Active Interventions: Assess patient/caregiver ability to obtain necessary supplies Assess patient/caregiver ability to perform ulcer/skin care regimen upon admission and as needed Assess ulceration(s) every visit Notes: Electronic Signature(s) Signed: 08/13/2018 4:46:21 PM By: Montey Hora Entered By: Montey Hora on 08/13/2018 13:01:53 Kolenda, [name] ([id number]) -------------------------------------------------------------------------------- Pain Assessment Details [name]. Date of Service: 08/13/2018 12:30 PM Medical Record Number: [id number] Patient Account Number: [id number] Date of Birth/Sex: 25-Dec-1940 (78 y.o. M) Treating RN: Army Melia Primary Care Inas Avena: Lavone Orn Other Clinician: Referring Brena Windsor: Lavone Orn Treating Sakai Heinle/Extender: Melburn Hake, HOYT Weeks in Treatment: 1 Active Problems Location of Pain Severity and Description of Pain Patient Has Paino No Site Locations Pain Management and Medication Current Pain Management: Electronic Signature(s) Signed: 08/13/2018 1:06:05 PM By: Army Melia Entered By: Army Melia on 08/13/2018 12:35:11 Kaps, [name] ([id number]) -------------------------------------------------------------------------------- Patient/Caregiver Education Details [name] Date of Service: 08/13/2018 12:30 PM Medical Record Number: [id number] Patient Account Number: [id number] Date of Birth/Gender: 01-19-1940 (78 y.o. M) Treating RN: Montey Hora Primary Care Physician: Lavone Orn Other Clinician: Referring Physician: Lavone Orn Treating Physician/Extender: Sharalyn Ink in Treatment: 1 Education Assessment Education Provided To: Patient and Caregiver Education Topics Provided Wound/Skin Impairment: Handouts: Other: wound care as ordered Methods: Explain/Verbal Responses: State content correctly Electronic Signature(s) Signed: 08/13/2018 4:46:21 PM By: Montey Hora Entered By: Montey Hora on 08/13/2018 13:04:52 Tyson, [name] ([id number]) -------------------------------------------------------------------------------- Wound Assessment Details [name]. Date of Service: 08/13/2018 12:30 PM Medical Record Number: [id number] Patient Account Number: [id number] Date of Birth/Sex: [birth year] (78 y.o. M) Treating RN: Army Melia Primary Care Kolyn Rozario: Lavone Orn Other Clinician: Referring Satya Buttram: Lavone Orn Treating Berkeley Vanaken/Extender: Melburn Hake, HOYT Weeks in Treatment: 1 Wound Status Wound Number: 1 Primary Diabetic Wound/Ulcer of the Lower Extremity Etiology: Wound Location: Left Foot - Plantar, Distal Secondary 2nd degree Burn Wounding Event: Thermal Burn Etiology: Date Acquired:  07/29/2018 Wound Status: Open Weeks Of Treatment: 1 Comorbid Cataracts, Glaucoma, Hypertension, Type II Clustered Wound: No History: Diabetes, Osteoarthritis, Neuropathy Photos Wound Measurements Length: (cm) 2 Width: (cm) 4 Depth: (cm) 0.1 Area: (cm) 6.283 Volume: (cm) 0.628 % Reduction in Area: 64.9% % Reduction in Volume: 82.5% Epithelialization: None Tunneling: No Undermining: No Wound Description Classification: Grade 2 Wound Margin: Flat and Intact Exudate Amount: Medium Exudate Type: Serosanguineous Exudate Color: red, brown Foul Odor After Cleansing: No Slough/Fibrino Yes Wound Bed Granulation Amount: Medium (34-66%) Exposed Structure Necrotic Amount: Medium (34-66%) Fascia Exposed: No Necrotic Quality: Eschar, Adherent Slough Fat Layer (Subcutaneous Tissue) Exposed: Yes Tendon Exposed: No Muscle Exposed: No Joint Exposed: No Bone Exposed: No Treatment Notes LEWIN, PELLOW ([id number]) Wound #1 (Left, Distal, Plantar Foot) Notes betadine to right lower leg; silvadene, gauze and conform to left foot Electronic Signature(s) Signed: 08/13/2018 1:06:05 PM By: Army Melia Entered By: Army Melia on 08/13/2018 12:43:16 Brawley, [name] ([id number]) -------------------------------------------------------------------------------- Wound Assessment Details [name]. Date of Service: 08/13/2018 12:30 PM Medical Record Number: [id number] Patient Account Number: [id number] Date of Birth/Sex: 12/06/1940 (78 y.o. M) Treating RN: Army Melia Primary Care Judyth Demarais: Lavone Orn Other Clinician: Referring Genavie Boettger: Lavone Orn Treating Avaline Stillson/Extender: Melburn Hake, HOYT Weeks in Treatment: 1 Wound Status Wound Number: 2 Primary Diabetic Wound/Ulcer of the Lower Extremity Etiology: Wound Location: Left Foot - Plantar, Proximal Secondary 2nd degree Burn Wounding Event: Thermal Burn Etiology: Date Acquired: 07/29/2018 Wound Status: Open Weeks  Of Treatment: 1 Comorbid  Cataracts, Glaucoma, Hypertension, Type II Clustered Wound: No History: Diabetes, Osteoarthritis, Neuropathy Photos Wound Measurements Length: (cm) 1 Width: (cm) 1.3 Depth: (cm) 0.1 Area: (cm) 1.021 Volume: (cm) 0.102 % Reduction in Area: 92.6% % Reduction in Volume: 92.6% Epithelialization: None Tunneling: No Undermining: No Wound Description Classification: Grade 1 Foul Odor Wound Margin: Flat and Intact Slough/Fib Exudate Amount: None Present After Cleansing: No rino Yes Wound Bed Granulation Amount: Medium (34-66%) Exposed Structure Necrotic Amount: Medium (34-66%) Fascia Exposed: No Necrotic Quality: Eschar, Adherent Slough Fat Layer (Subcutaneous Tissue) Exposed: No Tendon Exposed: No Muscle Exposed: No Joint Exposed: No Bone Exposed: No Limited to Skin Breakdown Treatment Notes Wound #2 (Left, Proximal, Plantar Foot) Bernards, JUVENAL UMAR. ([id number]) Notes betadine to right lower leg; silvadene, gauze and conform to left foot Electronic Signature(s) Signed: 08/13/2018 1:06:05 PM By: Army Melia Entered By: Army Melia on 08/13/2018 12:44:25 Massie, [name] ([id number]) -------------------------------------------------------------------------------- Wound Assessment Details [name]. Date of Service: 08/13/2018 12:30 PM Medical Record Number: [id number] Patient Account Number: [id number] Date of Birth/Sex: June 12, 1940 (78 y.o. M) Treating RN: Montey Hora Primary Care Brixton Franko: Lavone Orn Other Clinician: Referring Damarri Rampy: Lavone Orn Treating Glorianna Gott/Extender: Melburn Hake, HOYT Weeks in Treatment: 1 Wound Status Wound Number: 3 Primary Diabetic Wound/Ulcer of the Lower Extremity Etiology: Wound Location: Right Foot Secondary 2nd degree Burn Wounding Event: Thermal Burn Etiology: Date Acquired: 07/29/2018 Wound Status: Healed - Epithelialized Weeks Of Treatment: 1 Comorbid Cataracts, Glaucoma,  Hypertension, Type II Clustered Wound: No History: Diabetes, Osteoarthritis, Neuropathy Photos Wound Measurements Length: (cm) 0 % Reducti Width: (cm) 0 % Reducti Depth: (cm) 0 Epithelia Area: (cm) 0 Tunnelin Volume: (cm) 0 Undermin on in Area: 100% on in Volume: 100% lization: None g: No ing: No Wound Description Classification: Grade 1 Foul Odor Wound Margin: Flat and Intact Slough/Fi Exudate Amount: None Present After Cleansing: No brino Yes Wound Bed Granulation Amount: None Present (0%) Exposed Structure Necrotic Amount: Large (67-100%) Fascia Exposed: No Necrotic Quality: Eschar Fat Layer (Subcutaneous Tissue) Exposed: No Tendon Exposed: No Muscle Exposed: No Joint Exposed: No Bone Exposed: No Limited to Skin Breakdown Electronic Signature(s) Signed: 08/13/2018 4:46:21 PM By: Livia Snellen ([id number]) Entered By: Montey Hora on 08/13/2018 13:01:30 ANDREWJAMES, WEIRAUCH ([id number]) -------------------------------------------------------------------------------- Wound Assessment Details [name]. Date of Service: 08/13/2018 12:30 PM Medical Record Number: [id number] Patient Account Number: [id number] Date of Birth/Sex: 1940/09/01 (78 y.o. M) Treating RN: Army Melia Primary Care Sharde Gover: Lavone Orn Other Clinician: Referring Khyrie Masi: Lavone Orn Treating Arianna Haydon/Extender: Melburn Hake, HOYT Weeks in Treatment: 1 Wound Status Wound Number: 4 Primary Diabetic Wound/Ulcer of the Lower Extremity Etiology: Wound Location: Right Lower Leg - Anterior Secondary Trauma, Other Wounding Event: Trauma Etiology: Date Acquired: 07/29/2018 Wound Status: Open Weeks Of Treatment: 1 Comorbid Cataracts, Glaucoma, Hypertension, Type II Clustered Wound: No History: Diabetes, Osteoarthritis, Neuropathy Photos Wound Measurements Length: (cm) 10 % Reduction i Width: (cm) 1.5 % Reduction i Depth: (cm) 0 Epithelializa Area: (cm)  11.781 Tunneling: Volume: (cm) 1.178 Undermining: n Area: 0% n Volume: 0% tion: None No No Wound Description Classification: Grade 1 Foul Odor Aft Wound Margin: Flat and Intact Slough/Fibrin Exudate Amount: None Present er Cleansing: No o Yes Wound Bed Granulation Amount: None Present (0%) Exposed Structure Necrotic Amount: Large (67-100%) Fascia Exposed: No Necrotic Quality: Eschar Fat Layer (Subcutaneous Tissue) Exposed: No Tendon Exposed: No Muscle Exposed: No Joint Exposed: No Bone Exposed: No Limited to Skin Breakdown Treatment Notes Wound #4 (Right, Anterior Lower  Leg) AKIL, HOOS ([id number]) Notes betadine to right lower leg; silvadene, gauze and conform to left foot Electronic Signature(s) Signed: 08/13/2018 1:06:05 PM By: Army Melia Entered By: Army Melia on 08/13/2018 12:45:42 Grob, [name] ([id number]) -------------------------------------------------------------------------------- Wound Assessment Details [name]. Date of Service: 08/13/2018 12:30 PM Medical Record Number: [id number] Patient Account Number: [id number] Date of Birth/Sex: 1940/10/26 (78 y.o. M) Treating RN: Army Melia Primary Care Nilesh Stegall: Lavone Orn Other Clinician: Referring Damia Bobrowski: Lavone Orn Treating Laysha Childers/Extender: Melburn Hake, HOYT Weeks in Treatment: 1 Wound Status Wound Number: 5 Primary Diabetic Wound/Ulcer of the Lower Extremity Etiology: Wound Location: Right Lower Leg - Medial Secondary Trauma, Other Wounding Event: Trauma Etiology: Date Acquired: 07/29/2018 Wound Status: Open Weeks Of Treatment: 1 Comorbid Cataracts, Glaucoma, Hypertension, Type II Clustered Wound: No History: Diabetes, Osteoarthritis, Neuropathy Photos Wound Measurements Length: (cm) 0.1 % Reduction i Width: (cm) 0.1 % Reduction i Depth: (cm) 0.1 Epithelializa Area: (cm) 0.008 Tunneling: Volume: (cm) 0.001 Undermining: n Area: 97.6% n Volume:  97% tion: Medium (34-66%) No No Wound Description Classification: Grade 1 Foul Odor Aft Wound Margin: Flat and Intact Slough/Fibrin Exudate Amount: None Present er Cleansing: No o Yes Wound Bed Granulation Amount: None Present (0%) Exposed Structure Necrotic Amount: Large (67-100%) Fascia Exposed: No Necrotic Quality: Eschar Fat Layer (Subcutaneous Tissue) Exposed: No Tendon Exposed: No Muscle Exposed: No Joint Exposed: No Bone Exposed: No Limited to Skin Breakdown Treatment Notes Wound #5 (Right, Medial Lower Leg) Wiggs, MIKLE STERNBERG. ([id number]) Notes betadine to right lower leg; silvadene, gauze and conform to left foot Electronic Signature(s) Signed: 08/13/2018 1:06:05 PM By: Army Melia Entered By: Army Melia on 08/13/2018 12:46:07 [name] ([id number]) -------------------------------------------------------------------------------- Vitals Details [name]. Date of Service: 08/13/2018 12:30 PM Medical Record Number: [id number] Patient Account Number: [id number] Date of Birth/Sex: 1940-10-10 (78 y.o. M) Treating RN: Army Melia Primary Care Lylianna Fraiser: Lavone Orn Other Clinician: Referring Carlean Crowl: Lavone Orn Treating Pinky Ravan/Extender: Melburn Hake, HOYT Weeks in Treatment: 1 Vital Signs Time Taken: 12:39 Temperature (F): 99.1 Height (in): 69 Pulse (bpm): 94 Weight (lbs): 250 Respiratory Rate (breaths/min): 16 Body Mass Index (BMI): 36.9 Blood Pressure (mmHg): 130/80 Reference Range: 80 - 120 mg / dl Electronic Signature(s) Signed: 08/13/2018 1:06:05 PM By: Army Melia Entered By: Army Melia on 08/13/2018 12:39:14

## 2018-08-20 ENCOUNTER — Other Ambulatory Visit: Payer: Self-pay

## 2018-08-20 ENCOUNTER — Encounter: Payer: Medicare Other | Admitting: Physician Assistant

## 2018-08-20 DIAGNOSIS — E11621 Type 2 diabetes mellitus with foot ulcer: Secondary | ICD-10-CM | POA: Diagnosis not present

## 2018-08-20 NOTE — Progress Notes (Signed)
[name], [name] ([id number]) Visit Report for 08/20/2018 Chief Complaint Document Details [name], [name]. Date of Service: 08/20/2018 10:45 AM Medical Record Number: [id number] Patient Account Number: [id number] Date of Birth/Sex: Apr 29, 1940 (78 y.o. M) Treating RN: Montey Hora Primary Care Provider: Lavone Orn Other Clinician: Referring Provider: Lavone Orn Treating Provider/Extender: Melburn Hake, HOYT Weeks in Treatment: 2 Information Obtained from: Patient Chief Complaint Multiple upper and LE Ulcers Electronic Signature(s) Signed: 08/20/2018 11:05:37 AM By: Worthy Keeler PA-C Entered By: Worthy Keeler on 08/20/2018 11:05:37 [name], [name] ([id number]) -------------------------------------------------------------------------------- Physician Orders Details [name] Date of Service: 08/20/2018 10:45 AM Medical Record Number: [id number] Patient Account Number: [id number] Date of Birth/Sex: 1940/12/21 (78 y.o. M) Treating RN: Montey Hora Primary Care Provider: Lavone Orn Other Clinician: Referring Provider: Lavone Orn Treating Provider/Extender: Melburn Hake, HOYT Weeks in Treatment: 2 Verbal / Phone Orders: No Diagnosis Coding ICD-10 Coding Code Description T25.221A Burn of second degree of right foot, initial encounter T25.222A Burn of second degree of left foot, initial encounter E11.621 Type 2 diabetes mellitus with foot ulcer E11.40 Type 2 diabetes mellitus with diabetic neuropathy, unspecified T23.202A Burn of second degree of left hand, unspecified site, initial encounter I89.0 Lymphedema, not elsewhere classified L97.812 Non-pressure chronic ulcer of other part of right lower leg with fat layer exposed Wound Cleansing Wound #1 Left,Distal,Plantar Foot o Dial antibacterial soap, wash wounds, rinse and pat dry prior to dressing wounds o May Shower, gently pat wound dry prior to applying new dressing. Wound #2  Left,Proximal,Plantar Foot o Dial antibacterial soap, wash wounds, rinse and pat dry prior to dressing wounds o May Shower, gently pat wound dry prior to applying new dressing. Wound #4 Right,Anterior Lower Leg o Dial antibacterial soap, wash wounds, rinse and pat dry prior to dressing wounds o May Shower, gently pat wound dry prior to applying new dressing. Primary Wound Dressing Wound #1 Left,Distal,Plantar Foot o Silvadene Cream Wound #2 Left,Proximal,Plantar Foot o Silvadene Cream Wound #4 Right,Anterior Lower Leg o Other: - betadine paint Secondary Dressing Wound #1 Left,Distal,Plantar Foot o Gauze, ABD and Kerlix/Conform Wound #2 Left,Proximal,Plantar Foot o Gauze, ABD and Kerlix/Conform Dressing Change Frequency Wound #1 Left,Distal,Plantar Foot [name], [name]. ([id number]) o Change dressing every day. Wound #2 Left,Proximal,Plantar Foot o Change dressing every day. Wound #4 Right,Anterior Lower Leg o Change dressing every day. Follow-up Appointments o Return Appointment in 1 week. Additional Orders / Instructions o Other: - Do not walk without shoes at any time. Please wear protective gloves when picking up hot objects Electronic Signature(s) Unsigned Entered By: Montey Hora on 08/20/2018 11:12:26 Signature(s): Date(s): [name], [name] ([id number]) -------------------------------------------------------------------------------- Problem List Details [name], [name]. Date of Service: 08/20/2018 10:45 AM Medical Record Number: [id number] Patient Account Number: [id number] Date of Birth/Sex: 1940/06/29 (78 y.o. M) Treating RN: Montey Hora Primary Care Provider: Lavone Orn Other Clinician: Referring Provider: Lavone Orn Treating Provider/Extender: Melburn Hake, HOYT Weeks in Treatment: 2 Active Problems ICD-10 Evaluated Encounter Code Description Active Date Today Diagnosis T25.221A Burn of second degree of right  foot, initial encounter 08/06/2018 No Yes T25.222A Burn of second degree of left foot, initial encounter 08/06/2018 No Yes E11.621 Type 2 diabetes mellitus with foot ulcer 08/06/2018 No Yes E11.40 Type 2 diabetes mellitus with diabetic neuropathy, 08/06/2018 No Yes unspecified T23.202A Burn of second degree of left hand, unspecified site, initial 08/06/2018 No Yes encounter I89.0 Lymphedema, not elsewhere classified 08/06/2018 No Yes L97.812 Non-pressure chronic ulcer of other part of  right lower leg 08/06/2018 No Yes with fat layer exposed Inactive Problems Resolved Problems Electronic Signature(s) Signed: 08/20/2018 11:05:23 AM By: Worthy Keeler PA-C Entered By: Worthy Keeler on 08/20/2018 11:05:23

## 2018-08-27 ENCOUNTER — Encounter: Payer: Medicare Other | Admitting: Physician Assistant

## 2018-08-27 ENCOUNTER — Other Ambulatory Visit: Payer: Self-pay

## 2018-08-27 DIAGNOSIS — E11621 Type 2 diabetes mellitus with foot ulcer: Secondary | ICD-10-CM | POA: Diagnosis not present

## 2018-08-27 NOTE — Progress Notes (Addendum)
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, unspecified T23.202A Burn of second degree of left hand, unspecified site, initial encounter  I89.0 Lymphedema, not elsewhere classified L97.812 Non-pressure chronic ulcer of other part of right lower leg with fat layer exposed Facility Procedures CPT4 Code: 01561537 Description: 99214 - WOUND CARE VISIT-LEV 4 EST PT Modifier: Quantity: 1 Physician Procedures CPT4 Code: 9432761 Description: 47092 - WC PHYS LEVEL 4 - EST PT ICD-10 Diagnosis Description T25.221A Burn of second degree of right foot, initial encounter T25.222A Burn of second degree of left foot, initial encounter E11.621 Type 2 diabetes mellitus with foot ulcer  E11.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

## 2018-08-28 NOTE — Progress Notes (Signed)
[name], [name] ([id number]) Visit Report for 08/27/2018 Arrival Information Details [name], [name]. Date of Service: 08/27/2018 11:00 AM Medical Record Number: [id number] Patient Account Number: [id number] Date of Birth/Sex: January 03, 1941 (78 y.o. M) Treating RN: Montey Hora Primary Care Adamari Frede: Lavone Orn Other Clinician: Referring Deivi Huckins: Lavone Orn Treating Mariela Rex/Extender: Melburn Hake, HOYT Weeks in Treatment: 3 Visit Information History Since Last Visit Added or deleted any medications: No Patient Arrived: Cane Any new allergies or adverse reactions: No Arrival Time: 11:11 Had a fall or experienced change in No Accompanied By: wife activities of daily living that may affect Transfer Assistance: None risk of falls: Patient Identification Verified: Yes Signs or symptoms of abuse/neglect since last visito No Secondary Verification Process Completed: Yes Hospitalized since last visit: No Implantable device outside of the clinic excluding No cellular tissue based products placed in the center since last visit: Has Dressing in Place as Prescribed: Yes Pain Present Now: No Electronic Signature(s) Signed: 08/27/2018 4:45:42 PM By: Lorine Bears RCP, RRT, CHT Entered By: Lorine Bears on 08/27/2018 11:12:07 [name] ([id number]) -------------------------------------------------------------------------------- Clinic Level of Care Assessment Details [name]. Date of Service: 08/27/2018 11:00 AM Medical Record Number: [id number] Patient Account Number: [id number] Date of Birth/Sex: May 29, 1940 (78 y.o. M) Treating RN: Montey Hora Primary Care Ajmal Kathan: Lavone Orn Other Clinician: Referring Aldona Bryner: Lavone Orn Treating Carnesha Maravilla/Extender: Melburn Hake, HOYT Weeks in Treatment: 3 Clinic Level of Care Assessment Items TOOL 4 Quantity Score []  - Use when only an EandM is performed on FOLLOW-UP visit  0 ASSESSMENTS - Nursing Assessment / Reassessment X - Reassessment of Co-morbidities (includes updates in patient status) 1 10 X- 1 5 Reassessment of Adherence to Treatment Plan ASSESSMENTS - Wound and Skin Assessment / Reassessment []  - Simple Wound Assessment / Reassessment - one wound 0 X- 3 5 Complex Wound Assessment / Reassessment - multiple wounds []  - 0 Dermatologic / Skin Assessment (not related to wound area) ASSESSMENTS - Focused Assessment []  - Circumferential Edema Measurements - multi extremities 0 []  - 0 Nutritional Assessment / Counseling / Intervention X- 1 5 Lower Extremity Assessment (monofilament, tuning fork, pulses) []  - 0 Peripheral Arterial Disease Assessment (using hand held doppler) ASSESSMENTS - Ostomy and/or Continence Assessment and Care []  - Incontinence Assessment and Management 0 []  - 0 Ostomy Care Assessment and Management (repouching, etc.) PROCESS - Coordination of Care X - Simple Patient / Family Education for ongoing care 1 15 []  - 0 Complex (extensive) Patient / Family Education for ongoing care X- 1 10 Staff obtains Programmer, systems, Records, Test Results / Process Orders []  - 0 Staff telephones HHA, Nursing Homes / Clarify orders / etc []  - 0 Routine Transfer to another Facility (non-emergent condition) []  - 0 Routine Hospital Admission (non-emergent condition) []  - 0 New Admissions / Biomedical engineer / Ordering NPWT, Apligraf, etc. []  - 0 Emergency Hospital Admission (emergent condition) X- 1 10 Simple Discharge Coordination [name], [name] ([id number]) []  - 0 Complex (extensive) Discharge Coordination PROCESS - Special Needs []  - Pediatric / Minor Patient Management 0 []  - 0 Isolation Patient Management []  - 0 Hearing / Language / Visual special needs []  - 0 Assessment of Community assistance (transportation, D/C planning, etc.) []  - 0 Additional assistance / Altered mentation []  - 0 Support Surface(s) Assessment (bed,  cushion, seat, etc.) INTERVENTIONS - Wound Cleansing / Measurement []  - Simple Wound Cleansing - one wound 0 X- 3 5 Complex Wound Cleansing - multiple wounds X- 1  5 Wound Imaging (photographs - any number of wounds) []  - 0 Wound Tracing (instead of photographs) []  - 0 Simple Wound Measurement - one wound X- 3 5 Complex Wound Measurement - multiple wounds INTERVENTIONS - Wound Dressings X - Small Wound Dressing one or multiple wounds 2 10 []  - 0 Medium Wound Dressing one or multiple wounds []  - 0 Large Wound Dressing one or multiple wounds []  - 0 Application of Medications - topical []  - 0 Application of Medications - injection INTERVENTIONS - Miscellaneous []  - External ear exam 0 []  - 0 Specimen Collection (cultures, biopsies, blood, body fluids, etc.) []  - 0 Specimen(s) / Culture(s) sent or taken to Lab for analysis []  - 0 Patient Transfer (multiple staff / Civil Service fast streamer / Similar devices) []  - 0 Simple Staple / Suture removal (25 or less) []  - 0 Complex Staple / Suture removal (26 or more) []  - 0 Hypo / Hyperglycemic Management (close monitor of Blood Glucose) []  - 0 Ankle / Brachial Index (ABI) - do not check if billed separately X- 1 5 Vital Signs [name], [name] ([id number]) Has the patient been seen at the hospital within the last three years: Yes Total Score: 130 Level Of Care: New/Established - Level 4 Electronic Signature(s) Signed: 08/27/2018 5:30:55 PM By: Montey Hora Entered By: Montey Hora on 08/27/2018 11:25:22 [name] ([id number]) -------------------------------------------------------------------------------- Encounter Discharge Information Details [name]. Date of Service: 08/27/2018 11:00 AM Medical Record Number: [id number] Patient Account Number: [id number] Date of Birth/Sex: 1940-03-13 (78 y.o. M) Treating RN: Montey Hora Primary Care Mayci Haning: Lavone Orn Other Clinician: Referring Lavonia Eager: Lavone Orn Treating Alisea Matte/Extender: Melburn Hake, HOYT Weeks in Treatment: 3 Encounter Discharge Information Items Discharge Condition: Stable Ambulatory Status: Cane Discharge Destination: Home Transportation: Private Auto Accompanied By: wife Schedule Follow-up Appointment: Yes Clinical Summary of Care: Electronic Signature(s) Signed: 08/27/2018 5:30:55 PM By: Montey Hora Entered By: Montey Hora on 08/27/2018 11:26:44 [name] ([id number]) -------------------------------------------------------------------------------- Lower Extremity Assessment Details [name]. Date of Service: 08/27/2018 11:00 AM Medical Record Number: [id number] Patient Account Number: [id number] Date of Birth/Sex: [birth year] (78 y.o. M) Treating RN: Army Melia Primary Care Siraj Dermody: Lavone Orn Other Clinician: Referring Hajime Asfaw: Lavone Orn Treating Lakayla Barrington/Extender: STONE III, HOYT Weeks in Treatment: 3 Edema Assessment Assessed: [Left: No] [Right: No] Edema: [Left: No] [Right: No] Vascular Assessment Pulses: Dorsalis Pedis Palpable: [Left:Yes] [Right:Yes] Electronic Signature(s) Signed: 08/27/2018 11:41:56 AM By: Army Melia Entered By: Army Melia on 08/27/2018 11:15:40 [name], [name] ([id number]) -------------------------------------------------------------------------------- Multi Wound Chart Details [name]. Date of Service: 08/27/2018 11:00 AM Medical Record Number: [id number] Patient Account Number: [id number] Date of Birth/Sex: 25-Mar-1940 (78 y.o. M) Treating RN: Montey Hora Primary Care Lorrayne Ismael: Lavone Orn Other Clinician: Referring Shivali Quackenbush: Lavone Orn Treating Karolyna Bianchini/Extender: Melburn Hake, HOYT Weeks in Treatment: 3 Vital Signs Height(in): 69 Pulse(bpm): 85 Weight(lbs): 250 Blood Pressure(mmHg): 146/68 Body Mass Index(BMI): 37 Temperature(F): 97.9 Respiratory Rate 16 (breaths/min): Photos: Wound Location: Left  Foot - Plantar, Distal Left Foot - Plantar, Proximal Right, Anterior Lower Leg Wounding Event: Thermal Burn Thermal Burn Trauma 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 the Diabetic Wound/Ulcer of the Lower Extremity Lower Extremity Lower Extremity Secondary Etiology: 2nd degree Burn 2nd degree Burn Trauma, Other Comorbid History: Cataracts, Glaucoma, Cataracts, Glaucoma, Cataracts, Glaucoma, Hypertension, Type II Hypertension, Type II Hypertension, Type II Diabetes, Osteoarthritis, Diabetes, Osteoarthritis, Diabetes, Osteoarthritis, Neuropathy Neuropathy Neuropathy Date Acquired: 07/29/2018 07/29/2018 07/29/2018 Weeks of Treatment: 3 3 3  Wound Status: Open Open Healed - Epithelialized Measurements L x W x D 0.8x3.5x0.1 0.6x0.9x0.1 0x0x0 (cm) Area (cm) : 2.199 0.424 0 Volume (cm) : 0.22 0.042 0 % Reduction in Area: 87.70% 96.90% 100.00% % Reduction in Volume: 93.90% 97.00% 100.00% Classification: Grade 2 Grade 1 Grade 1 Exudate Amount: Medium Small None Present Exudate Type: Serosanguineous N/A N/A Exudate Color: red, brown N/A N/A Wound Margin: Flat and Intact Flat and Intact Flat and Intact Granulation Amount: Medium (34-66%) Medium (34-66%) None Present (0%) Granulation Quality: Pink Pink N/A Necrotic Amount: Small (1-33%) Medium (34-66%) Large (67-100%) Necrotic Tissue: Eschar, Adherent Slough Eschar, Adherent [place] Exposed Structures: Fat Layer (Subcutaneous Fascia: No Fascia: No Tissue) Exposed: Yes Fat Layer (Subcutaneous Fat Layer (Subcutaneous ZYGMUND, PASSERO ([id number]) Fascia: No Tissue) Exposed: No Tissue) Exposed: No Tendon: No Tendon: No Tendon: No Muscle: No Muscle: No Muscle: No Joint: No Joint: No Joint: No Bone: No Bone: No Bone: No Limited to Skin Breakdown Limited to Skin Breakdown Epithelialization: None None None Treatment Notes Electronic Signature(s) Signed: 08/27/2018 5:30:55 PM By: Montey Hora Entered  By: Montey Hora on 08/27/2018 11:21:45 [name] ([id number]) -------------------------------------------------------------------------------- Multi-Disciplinary Care Plan Details [name]. Date of Service: 08/27/2018 11:00 AM Medical Record Number: [id number] Patient Account Number: [id number] Date of Birth/Sex: November 06, 1940 (78 y.o. M) Treating RN: Montey Hora Primary Care Aunna Snooks: Lavone Orn Other Clinician: Referring Margan Elias: Lavone Orn Treating Adalid Beckmann/Extender: Melburn Hake, HOYT Weeks in Treatment: 3 Active Inactive Abuse / Safety / Falls / Self Care Management Nursing Diagnoses: Potential for falls Goals: Patient will not experience any injury related to falls Date Initiated: 08/06/2018 Target Resolution Date: 10/19/2018 Goal Status: Active Interventions: Assess fall risk on admission and as needed Notes: Orientation to the Wound Care Program Nursing Diagnoses: Knowledge deficit related to the wound healing center program Goals: Patient/caregiver will verbalize understanding of the [facility] Program Date Initiated: 08/06/2018 Target Resolution Date: 10/19/2018 Goal Status: Active Interventions: Provide education on orientation to the wound center Notes: Peripheral Neuropathy Nursing Diagnoses: Knowledge deficit related to disease process and management of peripheral neurovascular dysfunction Goals: Patient/caregiver will verbalize understanding of disease process and disease management Date Initiated: 08/06/2018 Target Resolution Date: 10/19/2018 Goal Status: Active Interventions: Provide education on Management of Neuropathy and Related Ulcers [name], [name] ([id number]) Notes: Wound/Skin Impairment Nursing Diagnoses: Impaired tissue integrity Goals: Ulcer/skin breakdown will heal within 14 weeks Date Initiated: 08/06/2018 Target Resolution Date: 10/19/2018 Goal Status: Active Interventions: Assess  patient/caregiver ability to obtain necessary supplies Assess patient/caregiver ability to perform ulcer/skin care regimen upon admission and as needed Assess ulceration(s) every visit Notes: Electronic Signature(s) Signed: 08/27/2018 5:30:55 PM By: Montey Hora Entered By: Montey Hora on 08/27/2018 11:21:35 [name] ([id number]) -------------------------------------------------------------------------------- Pain Assessment Details [name]. Date of Service: 08/27/2018 11:00 AM Medical Record Number: [id number] Patient Account Number: [id number] Date of Birth/Sex: 04/28/1940 (78 y.o. M) Treating RN: Montey Hora Primary Care Zoiee Wimmer: Lavone Orn Other Clinician: Referring Karington Zarazua: Lavone Orn Treating Elliett Guarisco/Extender: Melburn Hake, HOYT Weeks in Treatment: 3 Active Problems Location of Pain Severity and Description of Pain Patient Has Paino No Site Locations Pain Management and Medication Current Pain Management: Electronic Signature(s) Signed: 08/27/2018 4:45:42 PM By: Paulla Fore, RRT, CHT Signed: 08/27/2018 5:30:55 PM By: Montey Hora Entered By: Lorine Bears on 08/27/2018 11:12:15 [name], [name] ([id number]) -------------------------------------------------------------------------------- Patient/Caregiver Education Details [name] Date of Service: 08/27/2018 11:00 AM Medical Record Number: [id number] Patient  Account Number: [id number] Date of Birth/Gender: [birth year] (78 y.o. M) Treating RN: Montey Hora Primary Care Physician: Lavone Orn Other Clinician: Referring Physician: Lavone Orn Treating Physician/Extender: Sharalyn Ink in Treatment: 3 Education Assessment Education Provided To: Patient and Caregiver Education Topics Provided Wound/Skin Impairment: Handouts: Other: wound care as ordered Methods: Demonstration, Explain/Verbal Responses: State  content correctly Electronic Signature(s) Signed: 08/27/2018 5:30:55 PM By: Montey Hora Entered By: Montey Hora on 08/27/2018 11:25:45 [name], [name] ([id number]) -------------------------------------------------------------------------------- Wound Assessment Details [name]. Date of Service: 08/27/2018 11:00 AM Medical Record Number: [id number] Patient Account Number: [id number] Date of Birth/Sex: 10-04-1940 (78 y.o. M) Treating RN: Army Melia Primary Care Marguita Venning: Lavone Orn Other Clinician: Referring Maelys Kinnick: Lavone Orn Treating Timmy Bubeck/Extender: Melburn Hake, HOYT Weeks in Treatment: 3 Wound Status Wound Number: 1 Primary Diabetic Wound/Ulcer of the Lower Extremity Etiology: Wound Location: Left Foot - Plantar, Distal Secondary 2nd degree Burn Wounding Event: Thermal Burn Etiology: Date Acquired: 07/29/2018 Wound Status: Open Weeks Of Treatment: 3 Comorbid Cataracts, Glaucoma, Hypertension, Type II Clustered Wound: No History: Diabetes, Osteoarthritis, Neuropathy Photos Wound Measurements Length: (cm) 0.8 Width: (cm) 3.5 Depth: (cm) 0.1 Area: (cm) 2.199 Volume: (cm) 0.22 % Reduction in Area: 87.7% % Reduction in Volume: 93.9% Epithelialization: None Tunneling: No Undermining: No Wound Description Classification: Grade 2 Foul Odo Wound Margin: Flat and Intact Slough/F Exudate Amount: Medium Exudate Type: Serosanguineous Exudate Color: red, brown r After Cleansing: No ibrino Yes Wound Bed Granulation Amount: Medium (34-66%) Exposed Structure Granulation Quality: Pink Fascia Exposed: No Necrotic Amount: Small (1-33%) Fat Layer (Subcutaneous Tissue) Exposed: Yes Necrotic Quality: Eschar, Adherent Slough Tendon Exposed: No Muscle Exposed: No Joint Exposed: No Bone Exposed: No Treatment Notes [name], [name] ([id number]) Wound #1 (Left, Distal, Plantar Foot) Notes prisma, gauze and conform to left foot Electronic  Signature(s) Signed: 08/27/2018 11:41:56 AM By: Army Melia Entered By: Army Melia on 08/27/2018 11:14:11 [name], [name] ([id number]) -------------------------------------------------------------------------------- Wound Assessment Details [name]. Date of Service: 08/27/2018 11:00 AM Medical Record Number: [id number] Patient Account Number: [id number] Date of Birth/Sex: 10/06/1940 (78 y.o. M) Treating RN: Army Melia Primary Care Mitsuru Dault: Lavone Orn Other Clinician: Referring Layne Dilauro: Lavone Orn Treating Olvin Rohr/Extender: Melburn Hake, HOYT Weeks in Treatment: 3 Wound Status Wound Number: 2 Primary Diabetic Wound/Ulcer of the Lower Extremity Etiology: Wound Location: Left Foot - Plantar, Proximal Secondary 2nd degree Burn Wounding Event: Thermal Burn Etiology: Date Acquired: 07/29/2018 Wound Status: Open Weeks Of Treatment: 3 Comorbid Cataracts, Glaucoma, Hypertension, Type II Clustered Wound: No History: Diabetes, Osteoarthritis, Neuropathy Photos Wound Measurements Length: (cm) 0.6 Width: (cm) 0.9 Depth: (cm) 0.1 Area: (cm) 0.424 Volume: (cm) 0.042 % Reduction in Area: 96.9% % Reduction in Volume: 97% Epithelialization: None Tunneling: No Undermining: No Wound Description Classification: Grade 1 Foul Odor Wound Margin: Flat and Intact Slough/Fi Exudate Amount: Small After Cleansing: No brino Yes Wound Bed Granulation Amount: Medium (34-66%) Exposed Structure Granulation Quality: Pink Fascia Exposed: No Necrotic Amount: Medium (34-66%) Fat Layer (Subcutaneous Tissue) Exposed: No Necrotic Quality: Eschar, Adherent Slough Tendon Exposed: No Muscle Exposed: No Joint Exposed: No Bone Exposed: No Limited to Skin Breakdown Treatment Notes Wound #2 (Left, Proximal, Plantar Foot) [name], [name] ([id number]) Notes prisma, gauze and conform to left foot Electronic Signature(s) Signed: 08/27/2018 11:41:56 AM By: Army Melia Entered  By: Army Melia on 08/27/2018 11:14:43 [name], [name] ([id number]) -------------------------------------------------------------------------------- Wound Assessment Details [name]. Date of Service: 08/27/2018 11:00 AM Medical Record Number: [id number] Patient Account  Number: [id number] Date of Birth/Sex: May 05, 1940 (78 y.o. M) Treating RN: Montey Hora Primary Care Cindy Fullman: Lavone Orn Other Clinician: Referring Kenton Fortin: Lavone Orn Treating Connar Keating/Extender: Melburn Hake, HOYT Weeks in Treatment: 3 Wound Status Wound Number: 4 Primary Diabetic Wound/Ulcer of the Lower Extremity Etiology: Wound Location: Right, Anterior Lower Leg Secondary Trauma, Other Wounding Event: Trauma Etiology: Date Acquired: 07/29/2018 Wound Status: Healed - Epithelialized Weeks Of Treatment: 3 Comorbid Cataracts, Glaucoma, Hypertension, Type II Clustered Wound: No History: Diabetes, Osteoarthritis, Neuropathy Photos Wound Measurements Length: (cm) 0 % Reducti Width: (cm) 0 % Reducti Depth: (cm) 0 Epithelia Area: (cm) 0 Tunnelin Volume: (cm) 0 Undermin on in Area: 100% on in Volume: 100% lization: None g: No ing: No Wound Description Classification: Grade 1 Foul Odo Wound Margin: Flat and Intact Slough/F Exudate Amount: None Present r After Cleansing: No ibrino Yes Wound Bed Granulation Amount: None Present (0%) Exposed Structure Necrotic Amount: Large (67-100%) Fascia Exposed: No Necrotic Quality: Eschar Fat Layer (Subcutaneous Tissue) Exposed: No Tendon Exposed: No Muscle Exposed: No Joint Exposed: No Bone Exposed: No Limited to Skin Breakdown Electronic Signature(s) Signed: 08/27/2018 5:30:55 PM By: Livia Snellen ([id number]) Entered By: Montey Hora on 08/27/2018 11:21:16 BIFF, RUTIGLIANO ([id number]) -------------------------------------------------------------------------------- Vitals Details [name]. Date  of Service: 08/27/2018 11:00 AM Medical Record Number: [id number] Patient Account Number: [id number] Date of Birth/Sex: Jun 02, 1940 (78 y.o. M) Treating RN: Montey Hora Primary Care Naeemah Jasmer: Lavone Orn Other Clinician: Referring Hykeem Ojeda: Lavone Orn Treating Kirstein Baxley/Extender: Melburn Hake, HOYT Weeks in Treatment: 3 Vital Signs Time Taken: 00:10 Temperature (F): 97.9 Height (in): 69 Pulse (bpm): 85 Weight (lbs): 250 Respiratory Rate (breaths/min): 16 Body Mass Index (BMI): 36.9 Blood Pressure (mmHg): 146/68 Reference Range: 80 - 120 mg / dl Electronic Signature(s) Signed: 08/27/2018 4:45:42 PM By: Lorine Bears RCP, RRT, CHT Entered By: Lorine Bears on 08/27/2018 11:12:36

## 2018-09-10 ENCOUNTER — Ambulatory Visit: Payer: Medicare Other | Admitting: Physician Assistant

## 2018-09-13 ENCOUNTER — Encounter: Payer: Medicare Other | Attending: Physician Assistant | Admitting: Physician Assistant

## 2018-09-13 ENCOUNTER — Other Ambulatory Visit: Payer: Self-pay

## 2018-09-13 DIAGNOSIS — I10 Essential (primary) hypertension: Secondary | ICD-10-CM | POA: Diagnosis not present

## 2018-09-13 DIAGNOSIS — T23001A Burn of unspecified degree of right hand, unspecified site, initial encounter: Secondary | ICD-10-CM | POA: Diagnosis not present

## 2018-09-13 DIAGNOSIS — T23002A Burn of unspecified degree of left hand, unspecified site, initial encounter: Secondary | ICD-10-CM | POA: Insufficient documentation

## 2018-09-13 DIAGNOSIS — E11621 Type 2 diabetes mellitus with foot ulcer: Secondary | ICD-10-CM | POA: Insufficient documentation

## 2018-09-13 DIAGNOSIS — I890 Lymphedema, not elsewhere classified: Secondary | ICD-10-CM | POA: Diagnosis not present

## 2018-09-13 DIAGNOSIS — E1142 Type 2 diabetes mellitus with diabetic polyneuropathy: Secondary | ICD-10-CM | POA: Insufficient documentation

## 2018-09-13 DIAGNOSIS — L97812 Non-pressure chronic ulcer of other part of right lower leg with fat layer exposed: Secondary | ICD-10-CM | POA: Diagnosis not present

## 2018-09-13 NOTE — Progress Notes (Signed)
[name], [name] ([id number]) Visit Report for 09/13/2018 Arrival Information Details [name], [name]. Date of Service: 09/13/2018 10:30 AM Medical Record Number: [id number] Patient Account Number: [id number] Date of Birth/Sex: 08-May-1940 (78 y.o. M) Treating RN: Montey Hora Primary Care Jennifer Holland: Lavone Orn Other Clinician: Referring Yeni Jiggetts: Lavone Orn Treating Paraskevi Funez/Extender: Melburn Hake, HOYT Weeks in Treatment: 5 Visit Information History Since Last Visit Added or deleted any medications: No Patient Arrived: Cane Any new allergies or adverse reactions: No Arrival Time: 10:42 Had a fall or experienced change in No Accompanied By: wife activities of daily living that may affect Transfer Assistance: None risk of falls: Patient Identification Verified: Yes Signs or symptoms of abuse/neglect since last visito No Secondary Verification Process Completed: Yes Hospitalized since last visit: No Implantable device outside of the clinic excluding No cellular tissue based products placed in the center since last visit: Has Dressing in Place as Prescribed: Yes Pain Present Now: No Electronic Signature(s) Signed: 09/13/2018 3:54:12 PM By: Lorine Bears RCP, RRT, CHT Entered By: Lorine Bears on 09/13/2018 10:43:15 Kowal, [name] ([id number]) -------------------------------------------------------------------------------- Encounter Discharge Information Details [name]. Date of Service: 09/13/2018 10:30 AM Medical Record Number: [id number] Patient Account Number: [id number] Date of Birth/Sex: December 06, 1940 (78 y.o. M) Treating RN: Montey Hora Primary Care Candyce Gambino: Lavone Orn Other Clinician: Referring Teren Zurcher: Lavone Orn Treating Antia Rahal/Extender: Melburn Hake, HOYT Weeks in Treatment: 5 Encounter Discharge Information Items Post Procedure Vitals Discharge Condition: Stable Temperature (F): 99.1 Ambulatory  Status: Cane Pulse (bpm): 95 Discharge Destination: Home Respiratory Rate (breaths/min): 16 Transportation: Private Auto Blood Pressure (mmHg): 137/65 Accompanied By: spouse Schedule Follow-up Appointment: Yes Clinical Summary of Care: Electronic Signature(s) Signed: 09/13/2018 4:03:55 PM By: Montey Hora Entered By: Montey Hora on 09/13/2018 11:51:18 Lacuesta, [name] ([id number]) -------------------------------------------------------------------------------- Lower Extremity Assessment Details [name]. Date of Service: 09/13/2018 10:30 AM Medical Record Number: [id number] Patient Account Number: [id number] Date of Birth/Sex: 1940-08-21 (78 y.o. M) Treating RN: Army Melia Primary Care Flara Storti: Lavone Orn Other Clinician: Referring Vannesa Abair: Lavone Orn Treating Berlie Hatchel/Extender: STONE III, HOYT Weeks in Treatment: 5 Edema Assessment Assessed: [Left: No] [Right: No] Edema: [Left: N] [Right: o] Vascular Assessment Pulses: Dorsalis Pedis Palpable: [Left:Yes] Electronic Signature(s) Signed: 09/13/2018 11:25:26 AM By: Army Melia Entered By: Army Melia on 09/13/2018 10:58:09 [name] ([id number]) -------------------------------------------------------------------------------- Multi Wound Chart Details [name]. Date of Service: 09/13/2018 10:30 AM Medical Record Number: [id number] Patient Account Number: [id number] Date of Birth/Sex: [birth year] (78 y.o. M) Treating RN: Montey Hora Primary Care Lilyanna Lunt: Lavone Orn Other Clinician: Referring Dennard Vezina: Lavone Orn Treating Arieal Cuoco/Extender: Melburn Hake, HOYT Weeks in Treatment: 5 Vital Signs Height(in): 69 Pulse(bpm): 95 Weight(lbs): 250 Blood Pressure(mmHg): 137/65 Body Mass Index(BMI): 37 Temperature(F): 99.1 Respiratory Rate 16 (breaths/min): Photos: [N/A:N/A] Wound Location: Left Foot - Plantar, Distal Left Foot - Plantar, Proximal N/A Wounding Event:  Thermal Burn Thermal Burn N/A 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 the N/A Lower Extremity Lower Extremity Secondary Etiology: 2nd degree Burn 2nd degree Burn N/A Comorbid History: Cataracts, Glaucoma, Cataracts, Glaucoma, N/A Hypertension, Type II Hypertension, Type II Diabetes, Osteoarthritis, Diabetes, Osteoarthritis, Neuropathy Neuropathy Date Acquired: 07/29/2018 07/29/2018 N/A Weeks of Treatment: 5 5 N/A Wound Status: Open Open N/A Measurements L x W x D 0.1x0.1x0.1 0.5x0.6x0.1 N/A (cm) Area (cm) : 0.008 0.236 N/A Volume (cm) : 0.001 0.024 N/A % Reduction in Area: 100.00% 98.30% N/A % Reduction in Volume: 100.00% 98.30% N/A Classification: Grade 2 Grade  1 N/A Exudate Amount: Medium Small N/A Exudate Type: Serosanguineous N/A N/A Exudate Color: red, brown N/A N/A Wound Margin: Flat and Intact Flat and Intact N/A Granulation Amount: None Present (0%) Medium (34-66%) N/A Granulation Quality: N/A Pink N/A Necrotic Amount: Large (67-100%) Medium (34-66%) N/A Necrotic Tissue: Eschar, Adherent Slough Eschar, Adherent Slough N/A Exposed Structures: Fat Layer (Subcutaneous Fascia: No N/A Tissue) Exposed: Yes Fat Layer (Subcutaneous GIVON, DONAHOO ([id number]) Fascia: No Tissue) Exposed: No Tendon: No Tendon: No Muscle: No Muscle: No Joint: No Joint: No Bone: No Bone: No Limited to Skin Breakdown Epithelialization: None None N/A Treatment Notes Electronic Signature(s) Signed: 09/13/2018 4:03:55 PM By: Montey Hora Entered By: Montey Hora on 09/13/2018 11:31:06 [name] ([id number]) -------------------------------------------------------------------------------- Multi-Disciplinary Care Plan Details [name]. Date of Service: 09/13/2018 10:30 AM Medical Record Number: [id number] Patient Account Number: [id number] Date of Birth/Sex: [birth year] (78 y.o. M) Treating RN: Montey Hora Primary Care Tecora Eustache:  Lavone Orn Other Clinician: Referring Gretna Bergin: Lavone Orn Treating Chandra Feger/Extender: Melburn Hake, HOYT Weeks in Treatment: 5 Active Inactive Abuse / Safety / Falls / Self Care Management Nursing Diagnoses: Potential for falls Goals: Patient will not experience any injury related to falls Date Initiated: 08/06/2018 Target Resolution Date: 10/19/2018 Goal Status: Active Interventions: Assess fall risk on admission and as needed Notes: Orientation to the Wound Care Program Nursing Diagnoses: Knowledge deficit related to the wound healing center program Goals: Patient/caregiver will verbalize understanding of the [facility] Program Date Initiated: 08/06/2018 Target Resolution Date: 10/19/2018 Goal Status: Active Interventions: Provide education on orientation to the wound center Notes: Peripheral Neuropathy Nursing Diagnoses: Knowledge deficit related to disease process and management of peripheral neurovascular dysfunction Goals: Patient/caregiver will verbalize understanding of disease process and disease management Date Initiated: 08/06/2018 Target Resolution Date: 10/19/2018 Goal Status: Active Interventions: Provide education on Management of Neuropathy and Related Ulcers VAIDEN, WEE ([id number]) Notes: Wound/Skin Impairment Nursing Diagnoses: Impaired tissue integrity Goals: Ulcer/skin breakdown will heal within 14 weeks Date Initiated: 08/06/2018 Target Resolution Date: 10/19/2018 Goal Status: Active Interventions: Assess patient/caregiver ability to obtain necessary supplies Assess patient/caregiver ability to perform ulcer/skin care regimen upon admission and as needed Assess ulceration(s) every visit Notes: Electronic Signature(s) Signed: 09/13/2018 4:03:55 PM By: Montey Hora Entered By: Montey Hora on 09/13/2018 11:30:17 [name] ([id number]) -------------------------------------------------------------------------------- Pain  Assessment Details [name]. Date of Service: 09/13/2018 10:30 AM Medical Record Number: [id number] Patient Account Number: [id number] Date of Birth/Sex: [birth year] (78 y.o. M) Treating RN: Montey Hora Primary Care Aiden Rao: Lavone Orn Other Clinician: Referring Javiana Anwar: Lavone Orn Treating Joedy Eickhoff/Extender: Melburn Hake, HOYT Weeks in Treatment: 5 Active Problems Location of Pain Severity and Description of Pain Patient Has Paino No Site Locations Pain Management and Medication Current Pain Management: Electronic Signature(s) Signed: 09/13/2018 3:54:12 PM By: Paulla Fore, RRT, CHT Signed: 09/13/2018 4:03:55 PM By: Montey Hora Entered By: Lorine Bears on 09/13/2018 10:43:34 Larue, [name] ([id number]) -------------------------------------------------------------------------------- Patient/Caregiver Education Details [name] Date of Service: 09/13/2018 10:30 AM Medical Record Number: [id number] Patient Account Number: [id number] Date of Birth/Gender: 1940/07/06 (78 y.o. M) Treating RN: Montey Hora Primary Care Physician: Lavone Orn Other Clinician: Referring Physician: Lavone Orn Treating Physician/Extender: Sharalyn Ink in Treatment: 5 Education Assessment Education Provided To: Patient and Caregiver Education Topics Provided Wound/Skin Impairment: Handouts: Other: wound care as ordered Methods: Demonstration, Explain/Verbal Responses: State content correctly Electronic Signature(s) Signed: 09/13/2018 4:03:55 PM By: Montey Hora Entered By: Marjory Lies,  Joanna on 09/13/2018 11:40:11 KAYDIEN, SLINGER ([id number]) -------------------------------------------------------------------------------- Wound Assessment Details Patient Name: PAL, KIGHTLINGER. Date of Service: 09/13/2018 10:30 AM Medical Record Number: [id number] Patient Account Number: [id number] Date of Birth/Sex: 04-09-1940  (78 y.o. M) Treating RN: Army Melia Primary Care Jamarri Vuncannon: Lavone Orn Other Clinician: Referring Lise Pincus: Lavone Orn Treating Mayerli Kirst/Extender: Melburn Hake, HOYT Weeks in Treatment: 5 Wound Status Wound Number: 1 Primary Diabetic Wound/Ulcer of the Lower Extremity Etiology: Wound Location: Left Foot - Plantar, Distal Secondary 2nd degree Burn Wounding Event: Thermal Burn Etiology: Date Acquired: 07/29/2018 Wound Status: Open Weeks Of Treatment: 5 Comorbid Cataracts, Glaucoma, Hypertension, Type II Clustered Wound: No History: Diabetes, Osteoarthritis, Neuropathy Photos Wound Measurements Length: (cm) 0.1 Width: (cm) 0.1 Depth: (cm) 0.1 Area: (cm) 0.008 Volume: (cm) 0.001 % Reduction in Area: 100% % Reduction in Volume: 100% Epithelialization: None Tunneling: No Undermining: No Wound Description Classification: Grade 2 Wound Margin: Flat and Intact Exudate Amount: Medium Exudate Type: Serosanguineous Exudate Color: red, brown Foul Odor After Cleansing: No Slough/Fibrino Yes Wound Bed Granulation Amount: None Present (0%) Exposed Structure Necrotic Amount: Large (67-100%) Fascia Exposed: No Necrotic Quality: Eschar, Adherent Slough Fat Layer (Subcutaneous Tissue) Exposed: Yes Tendon Exposed: No Muscle Exposed: No Joint Exposed: No Bone Exposed: No Treatment Notes DAMIANO, RINEER ([id number]) Wound #1 (Left, Distal, Plantar Foot) Notes prisma, xeroform, gauze and conform to left foot Electronic Signature(s) Signed: 09/13/2018 11:25:26 AM By: Army Melia Entered By: Army Melia on 09/13/2018 10:57:07 [name] ([id number]) -------------------------------------------------------------------------------- Wound Assessment Details [name]. Date of Service: 09/13/2018 10:30 AM Medical Record Number: [id number] Patient Account Number: [id number] Date of Birth/Sex: [birth year] (78 y.o. M) Treating RN: Army Melia Primary Care  Gustavo Dispenza: Lavone Orn Other Clinician: Referring Randall Rampersad: Lavone Orn Treating Ayme Short/Extender: Melburn Hake, HOYT Weeks in Treatment: 5 Wound Status Wound Number: 2 Primary Diabetic Wound/Ulcer of the Lower Extremity Etiology: Wound Location: Left Foot - Plantar, Proximal Secondary 2nd degree Burn Wounding Event: Thermal Burn Etiology: Date Acquired: 07/29/2018 Wound Status: Open Weeks Of Treatment: 5 Comorbid Cataracts, Glaucoma, Hypertension, Type II Clustered Wound: No History: Diabetes, Osteoarthritis, Neuropathy Photos Wound Measurements Length: (cm) 0.5 Width: (cm) 0.6 Depth: (cm) 0.1 Area: (cm) 0.236 Volume: (cm) 0.024 % Reduction in Area: 98.3% % Reduction in Volume: 98.3% Epithelialization: None Tunneling: No Undermining: No Wound Description Classification: Grade 1 Foul Odor Wound Margin: Flat and Intact Slough/Fib Exudate Amount: Small After Cleansing: No rino Yes Wound Bed Granulation Amount: Medium (34-66%) Exposed Structure Granulation Quality: Pink Fascia Exposed: No Necrotic Amount: Medium (34-66%) Fat Layer (Subcutaneous Tissue) Exposed: No Necrotic Quality: Eschar, Adherent Slough Tendon Exposed: No Muscle Exposed: No Joint Exposed: No Bone Exposed: No Limited to Skin Breakdown Treatment Notes Wound #2 (Left, Proximal, Plantar Foot) Gidney, [name] ([id number]) Notes prisma, xeroform, gauze and conform to left foot Electronic Signature(s) Signed: 09/13/2018 11:25:26 AM By: Army Melia Entered By: Army Melia on 09/13/2018 10:57:37 Cashman, [name] ([id number]) -------------------------------------------------------------------------------- Vitals Details [name]. Date of Service: 09/13/2018 10:30 AM Medical Record Number: [id number] Patient Account Number: [id number] Date of Birth/Sex: 10/29/1940 (78 y.o. M) Treating RN: Montey Hora Primary Care Zayda Angell: Lavone Orn Other Clinician: Referring Stayce Delancy:  Lavone Orn Treating Barret Esquivel/Extender: Melburn Hake, HOYT Weeks in Treatment: 5 Vital Signs Time Taken: 10:43 Temperature (F): 99.1 Height (in): 69 Pulse (bpm): 95 Weight (lbs): 250 Respiratory Rate (breaths/min): 16 Body Mass Index (BMI): 36.9 Blood Pressure (mmHg): 137/65 Reference Range: 80 - 120 mg / dl Electronic Signature(s) Signed: 09/13/2018  3:54:12 PM By: Lorine Bears RCP, RRT, CHT Entered By: Lorine Bears on 09/13/2018 10:45:43

## 2018-09-13 NOTE — Progress Notes (Addendum)
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 [id number] Patient Account Number: [id number] Date of Birth/Sex: 1940/10/17 (78 y.o. M) Treating RN: Montey Hora Primary Care Provider: Lavone Orn Other Clinician: Referring Provider: Lavone Orn Treating Provider/Extender: Melburn Hake, HOYT Weeks in Treatment: 5 Information Obtained From Patient Constitutional Symptoms (General Health) Complaints and Symptoms: Negative for: Fatigue; Fever; Chills; Marked Weight Change Respiratory Complaints and Symptoms: Negative for: Chronic or frequent coughs; Shortness of Breath Medical History: Negative for: Aspiration; Asthma; Chronic Obstructive Pulmonary Disease (COPD); Pneumothorax; Sleep Apnea; Tuberculosis Cardiovascular Complaints and Symptoms: Negative for: Chest pain; LE edema Medical History: Positive for: Hypertension Negative for: Angina; Arrhythmia; Congestive Heart Failure; Coronary Artery Disease; Hypotension; Myocardial Infarction; Peripheral Arterial Disease; Peripheral Venous Disease; Phlebitis; Vasculitis Psychiatric Complaints and Symptoms: Negative for: Anxiety; Claustrophobia Medical History: Negative  for: Anorexia/bulimia; Confinement Anxiety Eyes Medical History: Positive for: Cataracts - both; Glaucoma Negative for: Optic Neuritis Ear/Nose/Mouth/Throat Medical History: Negative for: Chronic sinus problems/congestion; Middle ear problems Hematologic/Lymphatic Medical History: Negative for: Anemia; Hemophilia; Human Immunodeficiency Virus; Lymphedema; Sickle Cell Disease [name], [name] ([id number]) Gastrointestinal Medical History: Negative for: Cirrhosis ; Colitis; Crohnos; Hepatitis A; Hepatitis B; Hepatitis C Endocrine Medical History: Positive for: Type II Diabetes - ora agents Treated with: Oral agents Genitourinary Medical History: Negative for: End Stage Renal Disease Immunological Medical History: Negative for: Lupus Erythematosus; Raynaudos; Scleroderma Integumentary (Skin) Medical History: Negative for: History of Burn; History of pressure wounds Musculoskeletal Medical History: Positive for: Osteoarthritis - bilateral knees and ankles Negative for: Gout; Rheumatoid Arthritis; Osteomyelitis Neurologic Medical History: Positive for: Neuropathy Negative for: Dementia; Quadriplegia; Paraplegia; Seizure Disorder Oncologic Medical History: Negative for: Received Chemotherapy; Received Radiation HBO Extended History Items Eyes: Eyes: Cataracts Glaucoma Immunizations Pneumococcal Vaccine: Received Pneumococcal Vaccination: Yes Implantable Devices None Family and Social History Cancer: No; Diabetes: No; Heart Disease: No; Hereditary Spherocytosis: No; Hypertension: No; Kidney Disease: No; Lung Disease: No; Seizures: No; Stroke: No; Thyroid Problems: No; Tuberculosis: No; Never smoker; Marital Status - Married; [name], [name]. ([id number]) Alcohol Use: Never; Drug Use: No History; Caffeine Use: Moderate; Financial Concerns: No; Food, Clothing or Shelter Needs: No; Support System Lacking: No; Transportation Concerns: No Physician Affirmation I have reviewed  and agree with the above information. Electronic Signature(s) Signed: 09/13/2018 6:57:28 PM By: Worthy Keeler PA-C Signed: 09/17/2018 4:33:06 PM By: Montey Hora Entered By: Worthy Keeler on 09/13/2018 18:50:17 [name], [name] ([id number]) -------------------------------------------------------------------------------- SuperBill Details [name] Date of Service: 09/13/2018 Medical Record Number: [id number] Patient Account Number: [id number] Date of Birth/Sex: 24-Nov-1940 (78 y.o. M) Treating RN: Montey Hora Primary Care Provider: Lavone Orn Other Clinician: Referring Provider: Lavone Orn Treating Provider/Extender: Melburn Hake, HOYT Weeks in Treatment: 5 Diagnosis Coding ICD-10 Codes Code Description [id number] Burn of second degree of right foot, initial encounter T25.222A Burn of second degree of left foot, initial encounter E11.621 Type 2 diabetes mellitus with foot ulcer E11.40 Type 2 diabetes mellitus with diabetic neuropathy, unspecified T23.202A Burn of second degree of left hand, unspecified site, initial encounter I89.0 Lymphedema, not elsewhere classified L97.812 Non-pressure chronic ulcer of other part of right lower leg with fat layer exposed Facility Procedures CPT4 Code: [id number] Description: 11042 - DEB SUBQ TISSUE 20 SQ CM/< ICD-10 Diagnosis Description T25.222A Burn of second degree of left foot, initial encounter Modifier: Quantity: 1 Physician Procedures CPT4 Code: [id number] Description: [id number] - WC PHYS  SUBQ TISS 20 SQ CM ICD-10 Diagnosis Description T25.222A Burn of second degree of left foot, initial encounter Modifier: Quantity: 1 Electronic Signature(s) Signed: 09/13/2018 6:51:57 PM By: Worthy Keeler PA-C Entered By: Worthy Keeler on 09/13/2018 18:51:57

## 2018-09-20 ENCOUNTER — Encounter: Payer: Medicare Other | Admitting: Physician Assistant

## 2018-09-20 ENCOUNTER — Other Ambulatory Visit: Payer: Self-pay

## 2018-09-20 DIAGNOSIS — E11621 Type 2 diabetes mellitus with foot ulcer: Secondary | ICD-10-CM | POA: Diagnosis not present

## 2018-09-20 NOTE — Progress Notes (Addendum)
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 Apnea; Tuberculosis Cardiovascular Complaints and Symptoms: Negative for: Chest pain; LE edema Medical History: Positive for: Hypertension Negative for: Angina; Arrhythmia; Congestive Heart Failure; Coronary Artery Disease; Hypotension; Myocardial Infarction; Peripheral Arterial Disease; Peripheral Venous Disease; Phlebitis; Vasculitis Psychiatric Complaints and Symptoms: Negative for: Anxiety; Claustrophobia Medical History: Negative for: Anorexia/bulimia; Confinement Anxiety Eyes Medical History: Positive for: Cataracts - both; Glaucoma Negative for: Optic Neuritis Ear/Nose/Mouth/Throat Medical History: Negative for: Chronic sinus problems/congestion; Middle ear problems Hematologic/Lymphatic Medical History: Negative for: Anemia; Hemophilia; Human Immunodeficiency Virus; Lymphedema; Sickle Cell Disease MOURICE, CALMES  ([id number]) Gastrointestinal Medical History: Negative for: Cirrhosis ; Colitis; Crohnos; Hepatitis A; Hepatitis B; Hepatitis C Endocrine Medical History: Positive for: Type II Diabetes - ora agents Treated with: Oral agents Genitourinary Medical History: Negative for: End Stage Renal Disease Immunological Medical History: Negative for: Lupus Erythematosus; Raynaudos; Scleroderma Integumentary (Skin) Medical History: Negative for: History of Burn; History of pressure wounds Musculoskeletal Medical History: Positive for: Osteoarthritis - bilateral knees and ankles Negative for: Gout; Rheumatoid Arthritis; Osteomyelitis Neurologic Medical History: Positive for: Neuropathy Negative for: Dementia; Quadriplegia; Paraplegia; Seizure Disorder Oncologic Medical History: Negative for: Received Chemotherapy; Received Radiation HBO Extended History Items Eyes: Eyes: Cataracts Glaucoma Immunizations Pneumococcal Vaccine: Received Pneumococcal Vaccination: Yes Implantable Devices None Family and Social History Cancer: No; Diabetes: No; Heart Disease: No; Hereditary Spherocytosis: No; Hypertension: No; Kidney Disease: No; Lung Disease: No; Seizures: No; Stroke: No; Thyroid Problems: No; Tuberculosis: No; Never smoker; Marital Status - Married; FRED, ADJEI. ([id number]) Alcohol Use: Never; Drug Use: No History; Caffeine Use: Moderate; Financial Concerns: No; Food, Clothing or Shelter Needs: No; Support System Lacking: No; Transportation Concerns: No Physician Affirmation I have reviewed and agree with the above information. Electronic Signature(s) Signed: 09/20/2018 1:33:12 PM By: Worthy Keeler PA-C Signed: 09/20/2018 2:09:28 PM By: Montey Hora Entered By: Worthy Keeler on 09/20/2018 11:23:01 DMIR, RIEF ([id number]) -------------------------------------------------------------------------------- SuperBill Details Patient Name: GERRIT, SCHREINER. Date of Service:  09/20/2018 Medical Record Number: [id number] Patient Account Number: [id number] Date of Birth/Sex: May 05, 1940 (78 y.o. M) Treating RN: Montey Hora Primary Care Provider: Lavone Orn Other Clinician: Referring Provider: Lavone Orn Treating Provider/Extender: Melburn Hake, HOYT Weeks in Treatment: 6 Diagnosis Coding ICD-10 Codes Code Description [id number] Burn of second degree of right foot, initial encounter T25.222A Burn of second degree of left foot, initial encounter E11.621 Type 2 diabetes mellitus with foot ulcer E11.40 Type 2 diabetes mellitus with diabetic neuropathy, unspecified T23.202A Burn of second degree of left hand, unspecified site, initial encounter I89.0 Lymphedema, not elsewhere classified L97.812 Non-pressure chronic ulcer of other part of right lower leg with fat layer exposed Facility Procedures CPT4 Code: [id number] Description: [facility] - DEB SUBQ TISSUE 20 SQ CM/< ICD-10 Diagnosis Description T25.222A Burn of second degree of left foot, initial encounter E11.621 Type 2 diabetes mellitus with foot ulcer Modifier: Quantity: 1 Physician Procedures CPT4 Code: [id number] Description: 11042 - WC PHYS SUBQ TISS 20 SQ CM ICD-10 Diagnosis Description T25.222A Burn of second degree of left foot, initial encounter E11.621 Type 2 diabetes mellitus with foot ulcer Modifier: Quantity: 1 Electronic Signature(s) Signed: 09/20/2018 11:24:41 AM By: Worthy Keeler PA-C Entered By: Worthy Keeler on 09/20/2018 11:24:41

## 2018-09-20 NOTE — Progress Notes (Signed)
[name] ([id number]) Visit Report for 09/20/2018 Arrival Information Details [name], [name]. Date of Service: 09/20/2018 10:45 AM Medical Record Number: [id number] Patient Account Number: [id number] Date of Birth/Sex: [birth year] (78 y.o. M) Treating RN: Harold Barban Primary Care Lexia Vandevender: Lavone Orn Other Clinician: Referring Miklos Bidinger: Lavone Orn Treating Valaria Kohut/Extender: Melburn Hake, HOYT Weeks in Treatment: 6 Visit Information History Since Last Visit Added or deleted any medications: No Patient Arrived: [place] Any new allergies or adverse reactions: No Arrival Time: 10:54 Had a fall or experienced change in No Accompanied By: wife activities of daily living that may affect Transfer Assistance: None risk of falls: Patient Identification Verified: Yes Signs or symptoms of abuse/neglect since last visito No Secondary Verification Process Completed: Yes Hospitalized since last visit: No Has Dressing in Place as Prescribed: Yes Has Compression in Place as Prescribed: No Pain Present Now: No Electronic Signature(s) Signed: 09/20/2018 1:46:59 PM By: Harold Barban Entered By: Harold Barban on 09/20/2018 10:54:26 [name] ([id number]) -------------------------------------------------------------------------------- Encounter Discharge Information Details [name]. Date of Service: 09/20/2018 10:45 AM Medical Record Number: [id number] Patient Account Number: [id number] Date of Birth/Sex: June 21, 1940 (78 y.o. M) Treating RN: Montey Hora Primary Care Janvi Ammar: Lavone Orn Other Clinician: Referring Daphney Hopke: Lavone Orn Treating Iliany Losier/Extender: Melburn Hake, HOYT Weeks in Treatment: 6 Encounter Discharge Information Items Post Procedure Vitals Discharge Condition: Stable Temperature (F): 99.1 Ambulatory Status: Ambulatory Pulse (bpm): 102 Discharge Destination: Home Respiratory Rate (breaths/min): 16 Transportation:  Other Blood Pressure (mmHg): 162/80 Accompanied By: self Schedule Follow-up Appointment: Yes Clinical Summary of Care: Electronic Signature(s) Signed: 09/20/2018 2:09:28 PM By: Montey Hora Entered By: Montey Hora on 09/20/2018 11:28:47 [name] ([id number]) -------------------------------------------------------------------------------- Lower Extremity Assessment Details [name]. Date of Service: 09/20/2018 10:45 AM Medical Record Number: [id number] Patient Account Number: [id number] Date of Birth/Sex: 06/19/1940 (78 y.o. M) Treating RN: Harold Barban Primary Care Adriella Essex: Lavone Orn Other Clinician: Referring Tiziana Cislo: Lavone Orn Treating Embrie Mikkelsen/Extender: Melburn Hake, HOYT Weeks in Treatment: 6 Vascular Assessment Pulses: Dorsalis Pedis Palpable: [Left:Yes] Doppler Audible: [Left:Yes] Posterior Tibial Palpable: [Left:Yes] Electronic Signature(s) Signed: 09/20/2018 1:46:59 PM By: Harold Barban Entered By: Harold Barban on 09/20/2018 11:04:33 [name] ([id number]) -------------------------------------------------------------------------------- Multi Wound Chart Details [name]. Date of Service: 09/20/2018 10:45 AM Medical Record Number: [id number] Patient Account Number: [id number] Date of Birth/Sex: [birth year] (78 y.o. M) Treating RN: Montey Hora Primary Care Azarian Starace: Lavone Orn Other Clinician: Referring Malene Blaydes: Lavone Orn Treating Deven Furia/Extender: Melburn Hake, HOYT Weeks in Treatment: 6 Vital Signs Height(in): 69 Pulse(bpm): 102 Weight(lbs): 250 Blood Pressure(mmHg): 162/80 Body Mass Index(BMI): 37 Temperature(F): 99.1 Respiratory Rate 20 (breaths/min): Photos: [N/A:N/A] Wound Location: Left, Distal, Plantar Foot Left Foot - Plantar, Proximal N/A Wounding Event: Thermal Burn Thermal Burn N/A 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 the N/A Lower  Extremity Lower Extremity Secondary Etiology: 2nd degree Burn 2nd degree Burn N/A Comorbid History: Cataracts, Glaucoma, Cataracts, Glaucoma, N/A Hypertension, Type II Hypertension, Type II Diabetes, Osteoarthritis, Diabetes, Osteoarthritis, Neuropathy Neuropathy Date Acquired: 07/29/2018 07/29/2018 N/A Weeks of Treatment: 6 6 N/A Wound Status: Healed - Epithelialized Open N/A Measurements L x W x D 0x0x0 0.4x0.5x0.2 N/A (cm) Area (cm) : 0 0.157 N/A Volume (cm) : 0 0.031 N/A % Reduction in Area: 100.00% 98.90% N/A % Reduction in Volume: 100.00% 97.80% N/A Classification: Grade 2 Grade 1 N/A Exudate Amount: Medium Small N/A Exudate Type: Serosanguineous N/A N/A Exudate Color: red, brown N/A N/A Wound Margin: Flat and  Intact Flat and Intact N/A Granulation Amount: None Present (0%) Medium (34-66%) N/A Granulation Quality: N/A Pink N/A Necrotic Amount: Large (67-100%) Medium (34-66%) N/A Necrotic Tissue: Eschar, Adherent [place] N/A Exposed Structures: Fat Layer (Subcutaneous Fascia: No N/A Tissue) Exposed: Yes Fat Layer (Subcutaneous [name], [name] ([id number]) Fascia: No Tissue) Exposed: No Tendon: No Tendon: No Muscle: No Muscle: No Joint: No Joint: No Bone: No Bone: No Limited to Skin Breakdown Epithelialization: None None N/A Treatment Notes Electronic Signature(s) Signed: 09/20/2018 2:09:28 PM By: Montey Hora Entered By: Montey Hora on 09/20/2018 11:17:08 [name] ([id number]) -------------------------------------------------------------------------------- Multi-Disciplinary Care Plan Details [name]. Date of Service: 09/20/2018 10:45 AM Medical Record Number: [id number] Patient Account Number: [id number] Date of Birth/Sex: Oct 18, 1940 (78 y.o. M) Treating RN: Montey Hora Primary Care Brinton Brandel: Lavone Orn Other Clinician: Referring Lavalle Skoda: Lavone Orn Treating Cooper Stamp/Extender: Melburn Hake,  HOYT Weeks in Treatment: 6 Active Inactive Abuse / Safety / Falls / Self Care Management Nursing Diagnoses: Potential for falls Goals: Patient will not experience any injury related to falls Date Initiated: 08/06/2018 Target Resolution Date: 10/19/2018 Goal Status: Active Interventions: Assess fall risk on admission and as needed Notes: Orientation to the Wound Care Program Nursing Diagnoses: Knowledge deficit related to the wound healing center program Goals: Patient/caregiver will verbalize understanding of the [facility] Date Initiated: 08/06/2018 Target Resolution Date: 10/19/2018 Goal Status: Active Interventions: Provide education on orientation to the wound center Notes: Peripheral Neuropathy Nursing Diagnoses: Knowledge deficit related to disease process and management of peripheral neurovascular dysfunction Goals: Patient/caregiver will verbalize understanding of disease process and disease management Date Initiated: 08/06/2018 Target Resolution Date: 10/19/2018 Goal Status: Active Interventions: Provide education on Management of Neuropathy and Related Ulcers [name], [name] ([id number]) Notes: Wound/Skin Impairment Nursing Diagnoses: Impaired tissue integrity Goals: Ulcer/skin breakdown will heal within 14 weeks Date Initiated: 08/06/2018 Target Resolution Date: 10/19/2018 Goal Status: Active Interventions: Assess patient/caregiver ability to obtain necessary supplies Assess patient/caregiver ability to perform ulcer/skin care regimen upon admission and as needed Assess ulceration(s) every visit Notes: Electronic Signature(s) Signed: 09/20/2018 2:09:28 PM By: Montey Hora Entered By: Montey Hora on 09/20/2018 11:16:12 [name] ([id number]) -------------------------------------------------------------------------------- Pain Assessment Details [name]. Date of Service: 09/20/2018 10:45 AM Medical Record  Number: [id number] Patient Account Number: [id number] Date of Birth/Sex: 1940-06-25 (78 y.o. M) Treating RN: Harold Barban Primary Care Kelcy Laible: Lavone Orn Other Clinician: Referring Trayton Szabo: Lavone Orn Treating Janyth Riera/Extender: Melburn Hake, HOYT Weeks in Treatment: 6 Active Problems Location of Pain Severity and Description of Pain Patient Has Paino No Site Locations Pain Management and Medication Current Pain Management: Electronic Signature(s) Signed: 09/20/2018 1:46:59 PM By: Harold Barban Entered By: Harold Barban on 09/20/2018 10:54:32 Diggins, [name] ([id number]) -------------------------------------------------------------------------------- Patient/Caregiver Education Details [name] Date of Service: 09/20/2018 10:45 AM Medical Record Number: [id number] Patient Account Number: [id number] Date of Birth/Gender: 07-22-1940 (78 y.o. M) Treating RN: Montey Hora Primary Care Physician: Lavone Orn Other Clinician: Referring Physician: Lavone Orn Treating Physician/Extender: Sharalyn Ink in Treatment: 6 Education Assessment Education Provided To: Patient and Caregiver Education Topics Provided Wound/Skin Impairment: Handouts: Other: wound care to continue as ordered Methods: Demonstration, Explain/Verbal Responses: State content correctly Electronic Signature(s) Signed: 09/20/2018 2:09:28 PM By: Montey Hora Entered By: Montey Hora on 09/20/2018 11:20:34 [name] ([id number]) -------------------------------------------------------------------------------- Wound Assessment Details [name]. Date of Service: 09/20/2018 10:45 AM Medical Record Number: [id number] Patient Account Number: [id number] Date  of Birth/Sex: October 08, 1940 (78 y.o. M) Treating RN: Montey Hora Primary Care Lazariah Savard: Lavone Orn Other Clinician: Referring Cattaleya Wien: Lavone Orn Treating Lucillie Kiesel/Extender: Melburn Hake,  HOYT Weeks in Treatment: 6 Wound Status Wound Number: 1 Primary Diabetic Wound/Ulcer of the Lower Extremity Etiology: Wound Location: Left, Distal, Plantar Foot Secondary 2nd degree Burn Wounding Event: Thermal Burn Etiology: Date Acquired: 07/29/2018 Wound Status: Healed - Epithelialized Weeks Of Treatment: 6 Comorbid Cataracts, Glaucoma, Hypertension, Type II Clustered Wound: No History: Diabetes, Osteoarthritis, Neuropathy Photos Wound Measurements Length: (cm) 0 % Red Width: (cm) 0 % Red Depth: (cm) 0 Epith Area: (cm) 0 Tunn Volume: (cm) 0 Unde uction in Area: 100% uction in Volume: 100% elialization: None eling: No rmining: No Wound Description Classification: Grade 2 Wound Margin: Flat and Intact Exudate Amount: Medium Exudate Type: Serosanguineous Exudate Color: red, brown Foul Odor After Cleansing: No Slough/Fibrino Yes Wound Bed Granulation Amount: None Present (0%) Exposed Structure Necrotic Amount: Large (67-100%) Fascia Exposed: No Necrotic Quality: Eschar, Adherent Slough Fat Layer (Subcutaneous Tissue) Exposed: Yes Tendon Exposed: No Muscle Exposed: No Joint Exposed: No Bone Exposed: No Electronic Signature(s) [name], [name] ([id number]) Signed: 09/20/2018 2:09:28 PM By: Montey Hora Entered By: Montey Hora on 09/20/2018 11:16:59 [name] ([id number]) -------------------------------------------------------------------------------- Wound Assessment Details [name]. Date of Service: 09/20/2018 10:45 AM Medical Record Number: [id number] Patient Account Number: [id number] Date of Birth/Sex: [birth year] (78 y.o. M) Treating RN: Harold Barban Primary Care Maddyn Lieurance: Lavone Orn Other Clinician: Referring Mikahla Wisor: Lavone Orn Treating Trevion Hoben/Extender: Melburn Hake, HOYT Weeks in Treatment: 6 Wound Status Wound Number: 2 Primary Diabetic Wound/Ulcer of the Lower Extremity Etiology: Wound Location: Left Foot -  Plantar, Proximal Secondary 2nd degree Burn Wounding Event: Thermal Burn Etiology: Date Acquired: 07/29/2018 Wound Status: Open Weeks Of Treatment: 6 Comorbid Cataracts, Glaucoma, Hypertension, Type II Clustered Wound: No History: Diabetes, Osteoarthritis, Neuropathy Photos Wound Measurements Length: (cm) 0.4 Width: (cm) 0.5 Depth: (cm) 0.2 Area: (cm) 0.157 Volume: (cm) 0.031 % Reduction in Area: 98.9% % Reduction in Volume: 97.8% Epithelialization: None Tunneling: No Undermining: No Wound Description Classification: Grade 1 Foul Odor Wound Margin: Flat and Intact Slough/Fi Exudate Amount: Small After Cleansing: No brino Yes Wound Bed Granulation Amount: Medium (34-66%) Exposed Structure Granulation Quality: Pink Fascia Exposed: No Necrotic Amount: Medium (34-66%) Fat Layer (Subcutaneous Tissue) Exposed: No Necrotic Quality: Eschar, Adherent Slough Tendon Exposed: No Muscle Exposed: No Joint Exposed: No Bone Exposed: No Limited to Skin Breakdown Treatment Notes Wound #2 (Left, Proximal, Plantar Foot) [name], [name] ([id number]) Notes prisma, xeroform, gauze and conform to left foot Electronic Signature(s) Signed: 09/20/2018 1:46:59 PM By: Harold Barban Entered By: Harold Barban on 09/20/2018 11:02:29 [name] ([id number]) -------------------------------------------------------------------------------- Vitals Details [name]. Date of Service: 09/20/2018 10:45 AM Medical Record Number: [id number] Patient Account Number: [id number] Date of Birth/Sex: 26-Feb-1940 (78 y.o. M) Treating RN: Harold Barban Primary Care Lacosta Hargan: Lavone Orn Other Clinician: Referring Leesa Leifheit: Lavone Orn Treating Nuri Branca/Extender: Melburn Hake, HOYT Weeks in Treatment: 6 Vital Signs Time Taken: 10:50 Temperature (F): 99.1 Height (in): 69 Pulse (bpm): 102 Weight (lbs): 250 Respiratory Rate (breaths/min): 20 Body Mass Index (BMI): 36.9 Blood  Pressure (mmHg): 162/80 Reference Range: 80 - 120 mg / dl Electronic Signature(s) Signed: 09/20/2018 1:46:59 PM By: Harold Barban Entered By: Harold Barban on 09/20/2018 10:55:52

## 2018-09-27 ENCOUNTER — Other Ambulatory Visit: Payer: Self-pay

## 2018-09-27 ENCOUNTER — Encounter: Payer: Medicare Other | Admitting: Physician Assistant

## 2018-09-27 DIAGNOSIS — E11621 Type 2 diabetes mellitus with foot ulcer: Secondary | ICD-10-CM | POA: Diagnosis not present

## 2018-09-27 NOTE — Progress Notes (Addendum)
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 Medical Record Number: [id number] Patient Account Number: [id number] Date of Birth/Sex: 1940-04-16 (78 y.o. M) Treating RN: Montey Hora Primary Care Provider: Lavone Orn Other Clinician: Referring Provider: Lavone Orn Treating Provider/Extender: Melburn Hake, HOYT Weeks in Treatment: 7 Information Obtained From Patient Constitutional Symptoms (General Health) Complaints and Symptoms: Negative for: Fatigue; Fever; Chills; Marked Weight Change Respiratory Complaints and Symptoms: Negative for: Chronic or frequent coughs; Shortness of Breath Medical History: Negative for: Aspiration; Asthma; Chronic Obstructive Pulmonary Disease (COPD); Pneumothorax; Sleep Apnea; Tuberculosis Cardiovascular Complaints and Symptoms: Negative for: Chest pain; LE edema Medical History: Positive for: Hypertension Negative for: Angina; Arrhythmia; Congestive Heart Failure; Coronary Artery Disease; Hypotension; Myocardial Infarction; Peripheral Arterial Disease; Peripheral Venous Disease; Phlebitis; Vasculitis Psychiatric Complaints and  Symptoms: Negative for: Anxiety; Claustrophobia Medical History: Negative for: Anorexia/bulimia; Confinement Anxiety Eyes Medical History: Positive for: Cataracts - both; Glaucoma Negative for: Optic Neuritis Ear/Nose/Mouth/Throat Medical History: Negative for: Chronic sinus problems/congestion; Middle ear problems Hematologic/Lymphatic Medical History: Negative for: Anemia; Hemophilia; Human Immunodeficiency Virus; Lymphedema; Sickle Cell Disease [name], [name] ([id number]) Gastrointestinal Medical History: Negative for: Cirrhosis ; Colitis; Crohnos; Hepatitis A; Hepatitis B; Hepatitis C Endocrine Medical History: Positive for: Type II Diabetes - ora agents Treated with: Oral agents Genitourinary Medical History: Negative for: End Stage Renal Disease Immunological Medical History: Negative for: Lupus Erythematosus; Raynaudos; Scleroderma Integumentary (Skin) Medical History: Negative for: History of Burn; History of pressure wounds Musculoskeletal Medical History: Positive for: Osteoarthritis - bilateral knees and ankles Negative for: Gout; Rheumatoid Arthritis; Osteomyelitis Neurologic Medical History: Positive for: Neuropathy Negative for: Dementia; Quadriplegia; Paraplegia; Seizure Disorder Oncologic Medical History: Negative for: Received Chemotherapy; Received Radiation HBO Extended History Items Eyes: Eyes: Cataracts Glaucoma Immunizations Pneumococcal Vaccine: Received Pneumococcal Vaccination: Yes Implantable Devices None Family and Social History Cancer: No; Diabetes: No; Heart Disease: No; Hereditary Spherocytosis: No; Hypertension: No; Kidney Disease: No; Lung Disease: No; Seizures: No; Stroke: No; Thyroid Problems: No; Tuberculosis: No; Never smoker; Marital Status - Married; [name], [name]. ([id number]) Alcohol Use: Never; Drug Use: No History; Caffeine Use: Moderate; Financial Concerns: No; Food, Clothing or Shelter Needs: No; Support System  Lacking: No; Transportation Concerns: No Physician Affirmation I have reviewed and agree with the above information. Electronic Signature(s) Signed: 09/27/2018 4:27:31 PM By: Montey Hora Signed: 09/27/2018 4:44:05 PM By: Worthy Keeler PA-C Entered By: Worthy Keeler on 09/27/2018 13:34:31 Kneisel, Annetta Kennedy ([id number]) -------------------------------------------------------------------------------- SuperBill Details [name] Date of Service: 09/27/2018 Medical Record Number: [id number] Patient Account Number: [id number] Date of Birth/Sex: 1940/12/09 (78 y.o. M) Treating RN: Montey Hora Primary Care Provider: Lavone Orn Other Clinician: Referring Provider: Lavone Orn Treating Provider/Extender: Melburn Hake, HOYT Weeks in Treatment: 7 Diagnosis Coding ICD-10 Codes Code Description T25.221A Burn of second degree of right foot, initial encounter T25.222A Burn of second degree of left foot, initial encounter E11.621 Type 2 diabetes mellitus with foot ulcer E11.40 Type 2 diabetes mellitus with diabetic neuropathy, unspecified T23.202A Burn of second degree of left hand, unspecified site, initial encounter I89.0 Lymphedema, not elsewhere classified L97.812 Non-pressure chronic ulcer of other part of right lower leg with fat layer exposed Facility Procedures CPT4 Code: [id number] Description: 16020 - BURN DRSG W/O ANESTH-SM ICD-10 Diagnosis  Description T25.222A Burn of second degree of left foot, initial encounter Modifier: Quantity: 1 Physician Procedures CPT4 Code: [id number] Description: 16020 - WC PHYS DRESS/DEBRID SM,<5% TOT BODY SURF ICD-10 Diagnosis Description T25.222A Burn of second degree of left foot, initial encounter Modifier: Quantity: 1 Electronic Signature(s) Signed: 09/30/2018 4:46:00 PM By: Worthy Keeler PA-C Previous Signature: 09/27/2018 1:35:59 PM Version By: Worthy Keeler PA-C Entered By: Worthy Keeler on 09/30/2018 16:46:00

## 2018-10-01 NOTE — Progress Notes (Signed)
Dennis, Kennedy ([id number]) Visit Report for 09/27/2018 Arrival Information Details Patient Name: Dennis, Kennedy. Date of Service: 09/27/2018 12:45 PM Medical Record Number: [id number] Patient Account Number: [id number] Date of Birth/Sex: Nov 29, 1940 (78 y.o. M) Treating RN: Montey Hora Primary Care Sissy Goetzke: Lavone Orn Other Clinician: Referring Letetia Romanello: Lavone Orn Treating Cyle Kenyon/Extender: Melburn Hake, HOYT Weeks in Treatment: 7 Visit Information History Since Last Visit Added or deleted any medications: No Patient Arrived: Cane Any new allergies or adverse reactions: No Arrival Time: 12:52 Had a fall or experienced change in No Accompanied By: wife activities of daily living that may affect Transfer Assistance: None risk of falls: Patient Identification Verified: Yes Signs or symptoms of abuse/neglect since last visito No Secondary Verification Process Completed: Yes Hospitalized since last visit: No Implantable device outside of the clinic excluding No cellular tissue based products placed in the center since last visit: Has Dressing in Place as Prescribed: Yes Pain Present Now: No Electronic Signature(s) Signed: 10/01/2018 1:07:49 PM By: Lorine Bears RCP, RRT, CHT Entered By: Lorine Bears on 09/27/2018 12:54:14 [name], [name] ([id number]) -------------------------------------------------------------------------------- Encounter Discharge Information Details [name]. Date of Service: 09/27/2018 12:45 PM Medical Record Number: [id number] Patient Account Number: [id number] Date of Birth/Sex: [birth year] (78 y.o. M) Treating RN: Montey Hora Primary Care Jaire Pinkham: Lavone Orn Other Clinician: Referring Vincen Bejar: Lavone Orn Treating Nahal Wanless/Extender: Melburn Hake, HOYT Weeks in Treatment: 7 Encounter Discharge Information Items Post Procedure Vitals Discharge Condition: Stable Temperature (F):  98.6 Ambulatory Status: Cane Pulse (bpm): 101 Discharge Destination: Home Respiratory Rate (breaths/min): 16 Transportation: Private Auto Blood Pressure (mmHg): 127/73 Accompanied By: spouse Schedule Follow-up Appointment: Yes Clinical Summary of Care: Electronic Signature(s) Signed: 09/27/2018 4:27:31 PM By: Montey Hora Entered By: Montey Hora on 09/27/2018 13:32:56 [name], [name] ([id number]) -------------------------------------------------------------------------------- Lower Extremity Assessment Details [name]. Date of Service: 09/27/2018 12:45 PM Medical Record Number: [id number] Patient Account Number: [id number] Date of Birth/Sex: [birth year] (78 y.o. M) Treating RN: Army Melia Primary Care Ronal Maybury: Lavone Orn Other Clinician: Referring Edahi Kroening: Lavone Orn Treating Esau Fridman/Extender: STONE III, HOYT Weeks in Treatment: 7 Edema Assessment Assessed: [Left: No] [Right: No] Edema: [Left: N] [Right: o] Vascular Assessment Pulses: Dorsalis Pedis Palpable: [Left:Yes] Electronic Signature(s) Signed: 09/27/2018 3:16:22 PM By: Army Melia Entered By: Army Melia on 09/27/2018 13:00:46 [name], [name] ([id number]) -------------------------------------------------------------------------------- Multi Wound Chart Details [name]. Date of Service: 09/27/2018 12:45 PM Medical Record Number: [id number] Patient Account Number: [id number] Date of Birth/Sex: 21-May-1940 (78 y.o. M) Treating RN: Montey Hora Primary Care Cyrene Gharibian: Lavone Orn Other Clinician: Referring Ezeriah Luty: Lavone Orn Treating Kamden Reber/Extender: Melburn Hake, HOYT Weeks in Treatment: 7 Vital Signs Height(in): 69 Pulse(bpm): 101 Weight(lbs): 250 Blood Pressure(mmHg): 127/73 Body Mass Index(BMI): 37 Temperature(F): 98.6 Respiratory Rate 18 (breaths/min): Photos: [N/A:N/A] Wound Location: Left Foot - Plantar, Proximal N/A N/A Wounding Event:  Thermal Burn N/A N/A Primary Etiology: Diabetic Wound/Ulcer of the N/A N/A Lower Extremity Secondary Etiology: 2nd degree Burn N/A N/A Comorbid History: Cataracts, Glaucoma, N/A N/A Hypertension, Type II Diabetes, Osteoarthritis, Neuropathy Date Acquired: 07/29/2018 N/A N/A Weeks of Treatment: 7 N/A N/A Wound Status: Open N/A N/A Measurements L x W x D 0.4x0.5x0.2 N/A N/A (cm) Area (cm) : 0.157 N/A N/A Volume (cm) : 0.031 N/A N/A % Reduction in Area: 98.90% N/A N/A % Reduction in Volume: 97.80% N/A N/A Classification: Grade 1 N/A N/A Exudate Amount: Small N/A N/A Wound Margin: Flat and Intact N/A N/A Granulation Amount: Large (67-100%) N/A N/A  Granulation Quality: Pink N/A N/A Necrotic Amount: Small (1-33%) N/A N/A Necrotic Tissue: Eschar, Adherent Slough N/A N/A Exposed Structures: Fascia: No N/A N/A Fat Layer (Subcutaneous Tissue) Exposed: No Tendon: No Dennis, Kennedy ([id number]) Muscle: No Joint: No Bone: No Limited to Skin Breakdown Epithelialization: None N/A N/A Treatment Notes Electronic Signature(s) Signed: 09/27/2018 4:27:31 PM By: Montey Hora Entered By: Montey Hora on 09/27/2018 13:18:33 [name], [name] ([id number]) -------------------------------------------------------------------------------- Multi-Disciplinary Care Plan Details [name]. Date of Service: 09/27/2018 12:45 PM Medical Record Number: [id number] Patient Account Number: [id number] Date of Birth/Sex: December 06, 1940 (78 y.o. M) Treating RN: Montey Hora Primary Care Bernise Sylvain: Lavone Orn Other Clinician: Referring Wake Conlee: Lavone Orn Treating Sullivan Blasing/Extender: Melburn Hake, HOYT Weeks in Treatment: 7 Active Inactive Abuse / Safety / Falls / Self Care Management Nursing Diagnoses: Potential for falls Goals: Patient will not experience any injury related to falls Date Initiated: 08/06/2018 Target Resolution Date: 10/19/2018 Goal Status:  Active Interventions: Assess fall risk on admission and as needed Notes: Orientation to the Wound Care Program Nursing Diagnoses: Knowledge deficit related to the wound healing center program Goals: Patient/caregiver will verbalize understanding of the [facility] Date Initiated: 08/06/2018 Target Resolution Date: 10/19/2018 Goal Status: Active Interventions: Provide education on orientation to the wound center Notes: Peripheral Neuropathy Nursing Diagnoses: Knowledge deficit related to disease process and management of peripheral neurovascular dysfunction Goals: Patient/caregiver will verbalize understanding of disease process and disease management Date Initiated: 08/06/2018 Target Resolution Date: 10/19/2018 Goal Status: Active Interventions: Provide education on Management of Neuropathy and Related Ulcers Dennis, Kennedy ([id number]) Notes: Wound/Skin Impairment Nursing Diagnoses: Impaired tissue integrity Goals: Ulcer/skin breakdown will heal within 14 weeks Date Initiated: 08/06/2018 Target Resolution Date: 10/19/2018 Goal Status: Active Interventions: Assess patient/caregiver ability to obtain necessary supplies Assess patient/caregiver ability to perform ulcer/skin care regimen upon admission and as needed Assess ulceration(s) every visit Notes: Electronic Signature(s) Signed: 09/27/2018 4:27:31 PM By: Montey Hora Entered By: Montey Hora on 09/27/2018 13:18:23 [name] ([id number]) -------------------------------------------------------------------------------- Pain Assessment Details [name]. Date of Service: 09/27/2018 12:45 PM Medical Record Number: [id number] Patient Account Number: [id number] Date of Birth/Sex: 1940-07-11 (78 y.o. M) Treating RN: Montey Hora Primary Care Davinia Riccardi: Lavone Orn Other Clinician: Referring Jachai Okazaki: Lavone Orn Treating Shariq Puig/Extender: Melburn Hake, HOYT Weeks in  Treatment: 7 Active Problems Location of Pain Severity and Description of Pain Patient Has Paino No Site Locations Pain Management and Medication Current Pain Management: Electronic Signature(s) Signed: 09/27/2018 4:27:31 PM By: Montey Hora Signed: 10/01/2018 1:07:49 PM By: Lorine Bears RCP, RRT, CHT Entered By: Becky Sax, Amado Nash on 09/27/2018 12:54:21 [name], [name] ([id number]) -------------------------------------------------------------------------------- Wound Assessment Details [name]. Date of Service: 09/27/2018 12:45 PM Medical Record Number: [id number] Patient Account Number: [id number] Date of Birth/Sex: Jun 09, 1940 (78 y.o. M) Treating RN: Army Melia Primary Care Keylin Podolsky: Lavone Orn Other Clinician: Referring Ryan Ogborn: Lavone Orn Treating Lavelle Akel/Extender: Melburn Hake, HOYT Weeks in Treatment: 7 Wound Status Wound Number: 2 Primary Diabetic Wound/Ulcer of the Lower Extremity Etiology: Wound Location: Left Foot - Plantar, Proximal Secondary 2nd degree Burn Wounding Event: Thermal Burn Etiology: Date Acquired: 07/29/2018 Wound Status: Open Weeks Of Treatment: 7 Comorbid Cataracts, Glaucoma, Hypertension, Type II Clustered Wound: No History: Diabetes, Osteoarthritis, Neuropathy Photos Wound Measurements Length: (cm) 0.4 Width: (cm) 0.5 Depth: (cm) 0.2 Area: (cm) 0.157 Volume: (cm) 0.031 % Reduction in Area: 98.9% % Reduction in Volume: 97.8% Epithelialization: None Tunneling: No Undermining: No Wound Description Classification: Grade 1  Foul Odor Wound Margin: Flat and Intact Slough/Fib Exudate Amount: Small After Cleansing: No rino Yes Wound Bed Granulation Amount: Large (67-100%) Exposed Structure Granulation Quality: Pink Fascia Exposed: No Necrotic Amount: Small (1-33%) Fat Layer (Subcutaneous Tissue) Exposed: Yes Necrotic Quality: Adherent Slough Tendon Exposed: No Muscle Exposed:  No Joint Exposed: No Bone Exposed: No Treatment Notes Wound #2 (Left, Proximal, Plantar Foot) Handlin, JOLLY THAMMAVONGSA ([id number]) Notes prisma, foam donut, telfa Manufacturing systems engineer) Signed: 10/01/2018 8:22:37 AM By: Worthy Keeler PA-C Signed: 10/01/2018 3:53:22 PM By: Army Melia Previous Signature: 09/27/2018 3:16:22 PM Version By: Army Melia Entered By: Worthy Keeler on 10/01/2018 08:22:37 POLLUX, EYE ([id number]) -------------------------------------------------------------------------------- Vitals Details [name]. Date of Service: 09/27/2018 12:45 PM Medical Record Number: [id number] Patient Account Number: [id number] Date of Birth/Sex: [birth year] (78 y.o. M) Treating RN: Montey Hora Primary Care Marianne Golightly: Lavone Orn Other Clinician: Referring Shellye Zandi: Lavone Orn Treating Govanni Plemons/Extender: Melburn Hake, HOYT Weeks in Treatment: 7 Vital Signs Time Taken: 12:54 Temperature (F): 98.6 Height (in): 69 Pulse (bpm): 101 Weight (lbs): 250 Respiratory Rate (breaths/min): 18 Body Mass Index (BMI): 36.9 Blood Pressure (mmHg): 127/73 Reference Range: 80 - 120 mg / dl Electronic Signature(s) Signed: 10/01/2018 1:07:49 PM By: Lorine Bears RCP, RRT, CHT Entered By: Lorine Bears on 09/27/2018 12:55:20

## 2018-10-04 ENCOUNTER — Encounter: Payer: Medicare Other | Admitting: Physician Assistant

## 2018-10-04 ENCOUNTER — Other Ambulatory Visit: Payer: Self-pay

## 2018-10-04 DIAGNOSIS — E11621 Type 2 diabetes mellitus with foot ulcer: Secondary | ICD-10-CM | POA: Diagnosis not present

## 2018-10-04 NOTE — Progress Notes (Addendum)
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 Alcohol Use: Never; Drug Use: No History; Caffeine Use: Moderate; Financial Concerns: No; Food, Clothing or Shelter Needs: No; Support System Lacking: No; Transportation Concerns: No Physician Affirmation I have reviewed and agree with the above information. Electronic Signature(s) Signed: 10/04/2018 4:44:47 PM By: Montey Hora Signed: 10/04/2018 5:02:54 PM By: Worthy Keeler PA-C Entered By: Worthy Keeler on 10/04/2018 13:31:28 Edgren, [name] ([id number]) -------------------------------------------------------------------------------- SuperBill Details [name] Date of Service: 10/04/2018 Medical Record Number: [id number] Patient Account Number: [id number] Date of Birth/Sex: [birth year] (78 y.o. 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, unspecified T23.202A Burn of second degree of left hand, unspecified site, initial encounter I89.0 Lymphedema, not elsewhere classified L97.812 Non-pressure chronic ulcer of other part of right lower leg with fat layer exposed Facility Procedures CPT4 Code: [id number] Description: 99213 - WOUND CARE VISIT-LEV 3 EST PT Modifier: Quantity: 1 Physician Procedures CPT4 Code: [id number] Description: [id number] - WC PHYS LEVEL 4 - EST PT ICD-10 Diagnosis Description T25.222A Burn of second degree of left foot, initial encounter E11.621 Type 2 diabetes mellitus with foot ulcer E11.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

## 2018-10-04 NOTE — Progress Notes (Signed)
[name], [name] ([id number]) Visit Report for 10/04/2018 Arrival Information Details [name], [name]. Date of Service: 10/04/2018 1:00 PM Medical Record Number: [id number] Patient Account Number: [id number] Date of Birth/Sex: 21-Feb-1940 (78 y.o. M) Treating RN: [name] Primary Care [name]: [name] Other Clinician: Referring [name]: [name] Treating [name]: [name], [name]: 8 Visit Information History Since Last Visit Added or deleted any medications: No Patient Arrived: Cane Any new allergies or adverse reactions: No Arrival Time: 12:59 Had a fall or experienced change in No Accompanied By: wife activities of daily living that may affect Transfer Assistance: None risk of falls: Patient Identification Verified: Yes Signs or symptoms of abuse/neglect since last visito No Secondary Verification Process Completed: Yes Hospitalized since last visit: No Has Dressing in Place as Prescribed: Yes Pain Present Now: Yes Electronic Signature(s) Signed: 10/04/2018 4:30:52 PM By: [name] Entered By: [name] on 10/04/2018 13:02:50 [name] ([id number]) -------------------------------------------------------------------------------- Clinic Level of Care Assessment Details [name]. Date of Service: 10/04/2018 1:00 PM Medical Record Number: [id number] Patient Account Number: [id number] Date of Birth/Sex: 09-Jun-1940 (78 y.o. M) Treating RN: [name] Primary Care [name]: [name] Other Clinician: Referring [name]: [name] Treating [name]: [name], [name]: 8 Clinic Level of Care Assessment Items TOOL 4 Quantity Score []  - Use when only an EandM is performed on FOLLOW-UP visit 0 ASSESSMENTS - Nursing Assessment / Reassessment X - Reassessment of Co-morbidities (includes updates in patient status) 1 10 X- 1 5 Reassessment of Adherence to  Kennedy Plan ASSESSMENTS - Wound and Skin Assessment / Reassessment X - Simple Wound Assessment / Reassessment - one wound 1 5 []  - 0 Complex Wound Assessment / Reassessment - multiple wounds []  - 0 Dermatologic / Skin Assessment (not related to wound area) ASSESSMENTS - Focused Assessment []  - Circumferential Edema Measurements - multi extremities 0 []  - 0 Nutritional Assessment / Counseling / Intervention X- 1 5 Lower Extremity Assessment (monofilament, tuning fork, pulses) []  - 0 Peripheral Arterial Disease Assessment (using hand held doppler) ASSESSMENTS - Ostomy and/or Continence Assessment and Care []  - Incontinence Assessment and Management 0 []  - 0 Ostomy Care Assessment and Management (repouching, etc.) PROCESS - Coordination of Care X - Simple Patient / Family Education for ongoing care 1 15 []  - 0 Complex (extensive) Patient / Family Education for ongoing care X- 1 10 Staff obtains Programmer, systems, Records, Test Results / Process Orders []  - 0 Staff telephones HHA, Nursing Homes / Clarify orders / etc []  - 0 Routine Transfer to another Facility (non-emergent condition) []  - 0 Routine Hospital Admission (non-emergent condition) []  - 0 New Admissions / Biomedical engineer / Ordering NPWT, Apligraf, etc. []  - 0 Emergency Hospital Admission (emergent condition) X- 1 10 Simple Discharge Coordination [name], [name] ([id number]) []  - 0 Complex (extensive) Discharge Coordination PROCESS - Special Needs []  - Pediatric / Minor Patient Management 0 []  - 0 Isolation Patient Management []  - 0 Hearing / Language / Visual special needs []  - 0 Assessment of Community assistance (transportation, D/C planning, etc.) []  - 0 Additional assistance / Altered mentation []  - 0 Support Surface(s) Assessment (bed, cushion, seat, etc.) INTERVENTIONS - Wound Cleansing / Measurement X - Simple Wound Cleansing - one wound 1 5 []  - 0 Complex Wound Cleansing - multiple wounds X- 1  5 Wound Imaging (photographs - any number of wounds) []  - 0 Wound Tracing (instead of photographs) X- 1 5 Simple Wound  Measurement - one wound []  - 0 Complex Wound Measurement - multiple wounds INTERVENTIONS - Wound Dressings X - Small Wound Dressing one or multiple wounds 1 10 []  - 0 Medium Wound Dressing one or multiple wounds []  - 0 Large Wound Dressing one or multiple wounds []  - 0 Application of Medications - topical []  - 0 Application of Medications - injection INTERVENTIONS - Miscellaneous []  - External ear exam 0 []  - 0 Specimen Collection (cultures, biopsies, blood, body fluids, etc.) []  - 0 Specimen(s) / Culture(s) sent or taken to Lab for analysis []  - 0 Patient Transfer (multiple staff / Civil Service fast streamer / Similar devices) []  - 0 Simple Staple / Suture removal (25 or less) []  - 0 Complex Staple / Suture removal (26 or more) []  - 0 Hypo / Hyperglycemic Management (close monitor of Blood Glucose) []  - 0 Ankle / Brachial Index (ABI) - do not check if billed separately X- 1 5 Vital Signs [name] ([id number]) Has the patient been seen at the hospital within the last three years: Yes Total Score: 90 Level Of Care: New/Established - Level 3 Electronic Signature(s) Signed: 10/04/2018 4:44:47 PM By: [name] Entered By: [name] on 10/04/2018 13:30:31 Kennedy, Dennis Maw ([id number]) -------------------------------------------------------------------------------- Encounter Discharge Information Details [name]. Date of Service: 10/04/2018 1:00 PM Medical Record Number: [id number] Patient Account Number: [id number] Date of Birth/Sex: 1940-02-13 (78 y.o. M) Treating RN: [name] Primary Care [name]: [name] Other Clinician: Referring [name]: [name] Treating [name]: [name], [name]: 8 Encounter Discharge Information Items Discharge Condition: Stable Ambulatory Status:  Cane Discharge Destination: Home Transportation: Private Auto Accompanied By: wife Schedule Follow-up Appointment: Yes Clinical Summary of Care: Electronic Signature(s) Signed: 10/04/2018 4:44:47 PM By: [name] Entered By: [name] on 10/04/2018 13:32:25 [name] ([id number]) -------------------------------------------------------------------------------- Lower Extremity Assessment Details [name]. Date of Service: 10/04/2018 1:00 PM Medical Record Number: [id number] Patient Account Number: [id number] Date of Birth/Sex: 04-26-1940 (78 y.o. M) Treating RN: [name] Primary Care Mollee Neer: [name] Other Clinician: Referring Carron Mcmurry: [name] Treating Lakie Mclouth/Extender: [name], [name]: 8 Vascular Assessment Pulses: Dorsalis Pedis Palpable: [Left:Yes] Posterior Tibial Palpable: [Left:Yes] Electronic Signature(s) Signed: 10/04/2018 4:30:52 PM By: [name] Entered By: [name] on 10/04/2018 13:07:11 [place], Dennis Maw ([id number]) -------------------------------------------------------------------------------- Multi Wound Chart Details [name]. Date of Service: 10/04/2018 1:00 PM Medical Record Number: [id number] Patient Account Number: [id number] Date of Birth/Sex: [birth year] (78 y.o. M) Treating RN: [name] Primary Care Ben Sanz: [name] Other Clinician: Referring Chantrell Apsey: [name] Treating Evo Aderman/Extender: [name], [name]: 8 Vital Signs Height(in): 69 Pulse(bpm): 79 Weight(lbs): 250 Blood Pressure(mmHg): 162/84 Body Mass Index(BMI): 37 Temperature(F): 98.1 Respiratory Rate 18 (breaths/min): Photos: [N/A:N/A] Wound Location: Left Foot - Plantar, Proximal N/A N/A Wounding Event: Thermal Burn N/A N/A Primary Etiology: Diabetic Wound/Ulcer of the N/A N/A Lower Extremity Secondary Etiology: 2nd degree Burn N/A N/A Comorbid  History: Cataracts, Glaucoma, N/A N/A Hypertension, Type II Diabetes, Osteoarthritis, Neuropathy Date Acquired: 07/29/2018 N/A N/A Weeks of Kennedy: 8 N/A N/A Wound Status: Open N/A N/A Measurements L x W x D 0.4x0.5x0.2 N/A N/A (cm) Area (cm) : 0.157 N/A N/A Volume (cm) : 0.031 N/A N/A % Reduction in Area: 98.90% N/A N/A % Reduction in Volume: 97.80% N/A N/A Classification: Grade 1 N/A N/A Exudate Amount: Small N/A N/A Wound Margin: Flat and Intact N/A N/A Granulation Amount: Large (67-100%) N/A N/A Granulation  Quality: Pink N/A N/A Necrotic Amount: Small (1-33%) N/A N/A Exposed Structures: Fat Layer (Subcutaneous N/A N/A Tissue) Exposed: Yes Fascia: No Tendon: No Muscle: No LAURIER, CAMBRON ([id number]) Joint: No Bone: No Epithelialization: None N/A N/A Kennedy Notes Electronic Signature(s) Signed: 10/04/2018 4:44:47 PM By: [name] Entered By: [name] on 10/04/2018 13:26:03 [name] ([id number]) -------------------------------------------------------------------------------- [place] Details [name]. Date of Service: 10/04/2018 1:00 PM Medical Record Number: [id number] Patient Account Number: [id number] Date of Birth/Sex: 07/16/1940 (78 y.o. M) Treating RN: [name] Primary Care Markasia Carrol: [name] Other Clinician: Referring Joanathan Affeldt: [name] Treating Nicco Reaume/Extender: [name], [name]: 8 Active Inactive Abuse / Safety / Falls / Self Care Management Nursing Diagnoses: Potential for falls Goals: Patient will not experience any injury related to falls Date Initiated: 08/06/2018 Target Resolution Date: 10/19/2018 Goal Status: Active Interventions: Assess fall risk on admission and as needed Notes: Orientation to the Wound Care Program Nursing Diagnoses: Knowledge deficit related to the wound healing center program Goals: Patient/caregiver will verbalize  understanding of the [place] Program Date Initiated: 08/06/2018 Target Resolution Date: 10/19/2018 Goal Status: Active Interventions: Provide education on orientation to the wound center Notes: Peripheral Neuropathy Nursing Diagnoses: Knowledge deficit related to disease process and management of peripheral neurovascular dysfunction Goals: Patient/caregiver will verbalize understanding of disease process and disease management Date Initiated: 08/06/2018 Target Resolution Date: 10/19/2018 Goal Status: Active Interventions: Provide education on Management of Neuropathy and Related Ulcers NATHANYAL, MCMAHILL ([id number]) Notes: Wound/Skin Impairment Nursing Diagnoses: Impaired tissue integrity Goals: Ulcer/skin breakdown will heal within 14 weeks Date Initiated: 08/06/2018 Target Resolution Date: 10/19/2018 Goal Status: Active Interventions: Assess patient/caregiver ability to obtain necessary supplies Assess patient/caregiver ability to perform ulcer/skin care regimen upon admission and as needed Assess ulceration(s) every visit Notes: Electronic Signature(s) Signed: 10/04/2018 4:44:47 PM By: [name] Entered By: [name] on 10/04/2018 13:25:57 [place], Dennis Maw ([id number]) -------------------------------------------------------------------------------- Pain Assessment Details [name]. Date of Service: 10/04/2018 1:00 PM Medical Record Number: [id number] Patient Account Number: [id number] Date of Birth/Sex: 09-19-1940 (78 y.o. M) Treating RN: [name] Primary Care Gloria Ricardo: [name] Other Clinician: Referring Zaide Kardell: [name] Treating Mauriana Dann/Extender: [name], [name]: 8 Active Problems Location of Pain Severity and Description of Pain Patient Has Paino Yes Site Locations Rate the pain. Current Pain Level: 2 Pain Management and Medication Current Pain Management: Notes Stinging pain when  walking Electronic Signature(s) Signed: 10/04/2018 4:30:52 PM By: [name] Entered By: [name] on 10/04/2018 13:03:32 [name] ([id number]) -------------------------------------------------------------------------------- Patient/Caregiver Education Details [name]. Date of Service: 10/04/2018 1:00 PM Medical Record Number: [id number] Patient Account Number: [id number] Date of Birth/Gender: 04/02/1940 (78 y.o. M) Treating RN: [name] Primary Care Physician: [name] Other Clinician: Referring Physician: Lavone Kennedy Treating Physician/Extender: Sharalyn Ink in Kennedy: 8 Education Assessment Education Provided To: Patient and Caregiver Education Topics Provided Safety: Handouts: Other: foot care and wear shoes even at night to go to the bathroom Methods: Explain/Verbal Responses: State content correctly Electronic Signature(s) Signed: 10/04/2018 4:44:47 PM By: [name] Entered By: [name] on 10/04/2018 13:31:56 [place]on, Dennis Maw ([id number]) -------------------------------------------------------------------------------- Wound Assessment Details [name]. Date of Service: 10/04/2018 1:00 PM Medical Record Number: [id number] Patient Account Number: [id number] Date of Birth/Sex: 12-02-1940 (78 y.o. M) Treating RN: [name] Primary Care Parminder Trapani: [name] Other Clinician: Referring Asim Gersten: [name] Treating Masaji Billups/Extender: [name], Dennis  Weeks in Kennedy: 8 Wound Status Wound Number: 2 Primary Diabetic Wound/Ulcer of the Lower Extremity Etiology: Wound Location: Left, Proximal, Plantar Foot Secondary 2nd degree Burn Wounding Event: Thermal Burn Etiology: Date Acquired: 07/29/2018 Wound Status: Open Weeks Of Kennedy: 8 Comorbid Cataracts, Glaucoma, Hypertension, Type II Clustered Wound: No History: Diabetes, Osteoarthritis, Neuropathy Photos Wound  Measurements Length: (cm) 0.3 % Reduction Width: (cm) 0.4 % Reduction Depth: (cm) 0.2 Epithelializ Area: (cm) 0.094 Tunneling: Volume: (cm) 0.019 Undermining in Area: 99.3% in Volume: 98.6% ation: None No : No Wound Description Classification: Grade 1 Foul Odor Af Wound Margin: Flat and Intact Slough/Fibri Exudate Amount: Small ter Cleansing: No no Yes Wound Bed Granulation Amount: Large (67-100%) Exposed Structure Granulation Quality: Pink Fascia Exposed: No Necrotic Amount: Small (1-33%) Fat Layer (Subcutaneous Tissue) Exposed: Yes Necrotic Quality: Adherent Slough Tendon Exposed: No Muscle Exposed: No Joint Exposed: No Bone Exposed: No Kennedy Notes Wound #2 (Left, Proximal, Plantar Foot) SEDRICK, VICTORINO ([id number]) Notes prisma, foam donut, telfa Manufacturing systems engineer) Signed: 10/04/2018 4:44:47 PM By: [name] Entered By: [name] on 10/04/2018 13:27:03 [name] ([id number]) -------------------------------------------------------------------------------- Vitals Details [name]. Date of Service: 10/04/2018 1:00 PM Medical Record Number: [id number] Patient Account Number: [id number] Date of Birth/Sex: [birth year] (78 y.o. M) Treating RN: [name] Primary Care Britzy Graul: [name] Other Clinician: Referring Eugean Arnott: [name] Treating Fumiko Cham/Extender: [name], [name]: 8 Vital Signs Time Taken: 13:00 Temperature (F): 98.1 Height (in): 69 Pulse (bpm): 79 Weight (lbs): 250 Respiratory Rate (breaths/min): 18 Body Mass Index (BMI): 36.9 Blood Pressure (mmHg): 162/84 Reference Range: 80 - 120 mg / dl Electronic Signature(s) Signed: 10/04/2018 4:30:52 PM By: [name] Entered By: [name] on 10/04/2018 13:04:40

## 2018-10-11 ENCOUNTER — Other Ambulatory Visit: Payer: Self-pay

## 2018-10-11 ENCOUNTER — Encounter: Payer: Medicare Other | Attending: Physician Assistant | Admitting: Physician Assistant

## 2018-10-11 DIAGNOSIS — M1990 Unspecified osteoarthritis, unspecified site: Secondary | ICD-10-CM | POA: Diagnosis not present

## 2018-10-11 DIAGNOSIS — E1142 Type 2 diabetes mellitus with diabetic polyneuropathy: Secondary | ICD-10-CM | POA: Diagnosis not present

## 2018-10-11 DIAGNOSIS — H409 Unspecified glaucoma: Secondary | ICD-10-CM | POA: Diagnosis not present

## 2018-10-11 DIAGNOSIS — W010XXA Fall on same level from slipping, tripping and stumbling without subsequent striking against object, initial encounter: Secondary | ICD-10-CM | POA: Insufficient documentation

## 2018-10-11 DIAGNOSIS — I890 Lymphedema, not elsewhere classified: Secondary | ICD-10-CM | POA: Diagnosis not present

## 2018-10-11 DIAGNOSIS — E11621 Type 2 diabetes mellitus with foot ulcer: Secondary | ICD-10-CM | POA: Insufficient documentation

## 2018-10-11 DIAGNOSIS — I10 Essential (primary) hypertension: Secondary | ICD-10-CM | POA: Insufficient documentation

## 2018-10-11 DIAGNOSIS — T23001A Burn of unspecified degree of right hand, unspecified site, initial encounter: Secondary | ICD-10-CM | POA: Diagnosis not present

## 2018-10-11 DIAGNOSIS — E1136 Type 2 diabetes mellitus with diabetic cataract: Secondary | ICD-10-CM | POA: Insufficient documentation

## 2018-10-11 DIAGNOSIS — T23002A Burn of unspecified degree of left hand, unspecified site, initial encounter: Secondary | ICD-10-CM | POA: Diagnosis not present

## 2018-10-11 DIAGNOSIS — H269 Unspecified cataract: Secondary | ICD-10-CM | POA: Insufficient documentation

## 2018-10-11 DIAGNOSIS — L97812 Non-pressure chronic ulcer of other part of right lower leg with fat layer exposed: Secondary | ICD-10-CM | POA: Diagnosis not present

## 2018-10-11 NOTE — Progress Notes (Addendum)
RONNE, MEZGER ([id number]) Visit Report for 10/11/2018 Arrival Information Details [name], [name]. Date of Service: 10/11/2018 1:00 PM Medical Record Number: [id number] Patient Account Number: [id number] Date of Birth/Sex: [birth year] (78 y.o. M) Treating RN: Harold Barban Primary Care Cristin Penaflor: Lavone Orn Other Clinician: Referring Enriqueta Augusta: Lavone Orn Treating Azael Ragain/Extender: Melburn Hake, HOYT Weeks in Treatment: 9 Visit Information History Since Last Visit Added or deleted any medications: No Patient Arrived: Cane Any new allergies or adverse reactions: No Arrival Time: 13:04 Had a fall or experienced change in No Accompanied By: wife activities of daily living that may affect Transfer Assistance: None risk of falls: Patient Identification Verified: Yes Signs or symptoms of abuse/neglect since last visito No Secondary Verification Process Completed: Yes Hospitalized since last visit: No Has Dressing in Place as Prescribed: Yes Pain Present Now: No Electronic Signature(s) Signed: 10/11/2018 4:43:08 PM By: Harold Barban Entered By: Harold Barban on 10/11/2018 13:05:11 [name] ([id number]) -------------------------------------------------------------------------------- Encounter Discharge Information Details [name]. Date of Service: 10/11/2018 1:00 PM Medical Record Number: [id number] Patient Account Number: [id number] Date of Birth/Sex: June 10, 1940 (78 y.o. M) Treating RN: Montey Hora Primary Care Chamya Hunton: Lavone Orn Other Clinician: Referring Nona Gracey: Lavone Orn Treating Meilin Brosh/Extender: Melburn Hake, HOYT Weeks in Treatment: 9 Encounter Discharge Information Items Discharge Condition: Stable Ambulatory Status: Cane Discharge Destination: Home Transportation: Private Auto Accompanied By: spouse Schedule Follow-up Appointment: Yes Clinical Summary of Care: Electronic Signature(s) Signed: 10/11/2018 1:33:18  PM By: Montey Hora Entered By: Montey Hora on 10/11/2018 13:33:17 Coin, [name] ([id number]) -------------------------------------------------------------------------------- Lower Extremity Assessment Details [name]. Date of Service: 10/11/2018 1:00 PM Medical Record Number: [id number] Patient Account Number: [id number] Date of Birth/Sex: 03-Jun-1940 (78 y.o. M) Treating RN: Harold Barban Primary Care Sharmarke Cicio: Lavone Orn Other Clinician: Referring Javaya [place]: Lavone Orn Treating Cash Meadow/Extender: Melburn Hake, HOYT Weeks in Treatment: 9 Electronic Signature(s) Signed: 10/11/2018 4:43:08 PM By: Harold Barban Entered By: Harold Barban on 10/11/2018 13:10:45 Holway, [name] ([id number]) -------------------------------------------------------------------------------- Multi Wound Chart Details [name] Date of Service: 10/11/2018 1:00 PM Medical Record Number: [id number] Patient Account Number: [id number] Date of Birth/Sex: [birth year] (78 y.o. M) Treating RN: Montey Hora Primary Care Makaylyn Sinyard: Lavone Orn Other Clinician: Referring Elester Apodaca: Lavone Orn Treating Raevin Wierenga/Extender: Melburn Hake, HOYT Weeks in Treatment: 9 Vital Signs Height(in): 69 Pulse(bpm): 95 Weight(lbs): 250 Blood Pressure(mmHg): 154/76 Body Mass Index(BMI): 37 Temperature(F): 98.4 Respiratory Rate 18 (breaths/min): Photos: [N/A:N/A] Wound Location: Left Foot - Plantar, Proximal N/A N/A Wounding Event: Thermal Burn N/A N/A Primary Etiology: 2nd degree Burn N/A N/A Secondary Etiology: Diabetic Wound/Ulcer of the N/A N/A Lower Extremity Comorbid History: Cataracts, Glaucoma, N/A N/A Hypertension, Type II Diabetes, Osteoarthritis, Neuropathy Date Acquired: 07/29/2018 N/A N/A Weeks of Treatment: 9 N/A N/A Wound Status: Open N/A N/A Measurements L x W x D 0.4x0.5x0.2 N/A N/A (cm) Area (cm) : 0.157 N/A N/A Volume (cm) : 0.031 N/A N/A %  Reduction in Area: 98.90% N/A N/A % Reduction in Volume: 97.80% N/A N/A Classification: Full Thickness Without N/A N/A Exposed Support Structures Exudate Amount: Small N/A N/A Wound Margin: Distinct, outline attached N/A N/A Granulation Amount: Large (67-100%) N/A N/A Granulation Quality: Pink N/A N/A Necrotic Amount: Small (1-33%) N/A N/A Exposed Structures: Fat Layer (Subcutaneous N/A N/A Tissue) Exposed: Yes Fascia: No Tendon: No ELISE, LORIS. ([id number]) Muscle: No Joint: No Bone: No Epithelialization: None N/A N/A Procedures Performed: Burn Debridement: Small N/A N/A Treatment Notes Wound #2 (Left, Proximal, Plantar Foot) Notes prisma,  felt donut, gauze and conform Electronic Signature(s) Signed: 10/14/2018 4:25:53 PM By: Worthy Keeler PA-C Previous Signature: 10/11/2018 4:39:44 PM Version By: Montey Hora Entered By: Worthy Keeler on 10/14/2018 16:25:52 Rama, [name] ([id number]) -------------------------------------------------------------------------------- [place] Details Patient Name: Kennedy, Dennis. Date of Service: 10/11/2018 1:00 PM Medical Record Number: [id number] Patient Account Number: [id number] Date of Birth/Sex: [birth year] (78 y.o. M) Treating RN: Montey Hora Primary Care Jatziry Wechter: Lavone Orn Other Clinician: Referring Jaylynne Birkhead: Lavone Orn Treating Nalany Steedley/Extender: Melburn Hake, HOYT Weeks in Treatment: 9 Active Inactive Abuse / Safety / Falls / Self Care Management Nursing Diagnoses: Potential for falls Goals: Patient will not experience any injury related to falls Date Initiated: 08/06/2018 Target Resolution Date: 10/19/2018 Goal Status: Active Interventions: Assess fall risk on admission and as needed Notes: Orientation to the Wound Care Program Nursing Diagnoses: Knowledge deficit related to the wound healing center program Goals: Patient/caregiver will verbalize understanding of the [place]  Program Date Initiated: 08/06/2018 Target Resolution Date: 10/19/2018 Goal Status: Active Interventions: Provide education on orientation to the wound center Notes: Peripheral Neuropathy Nursing Diagnoses: Knowledge deficit related to disease process and management of peripheral neurovascular dysfunction Goals: Patient/caregiver will verbalize understanding of disease process and disease management Date Initiated: 08/06/2018 Target Resolution Date: 10/19/2018 Goal Status: Active Interventions: Provide education on Management of Neuropathy and Related Ulcers RAYCEN, LIBONATI ([id number]) Notes: Wound/Skin Impairment Nursing Diagnoses: Impaired tissue integrity Goals: Ulcer/skin breakdown will heal within 14 weeks Date Initiated: 08/06/2018 Target Resolution Date: 10/19/2018 Goal Status: Active Interventions: Assess patient/caregiver ability to obtain necessary supplies Assess patient/caregiver ability to perform ulcer/skin care regimen upon admission and as needed Assess ulceration(s) every visit Notes: Electronic Signature(s) Signed: 10/11/2018 4:39:44 PM By: Montey Hora Entered By: Montey Hora on 10/11/2018 13:21:21 [name] ([id number]) -------------------------------------------------------------------------------- Pain Assessment Details [name]. Date of Service: 10/11/2018 1:00 PM Medical Record Number: [id number] Patient Account Number: [id number] Date of Birth/Sex: 1940/08/20 (78 y.o. M) Treating RN: Harold Barban Primary Care Asencion Guisinger: Lavone Orn Other Clinician: Referring Gladis Soley: Lavone Orn Treating Kye Hedden/Extender: Melburn Hake, HOYT Weeks in Treatment: 9 Active Problems Location of Pain Severity and Description of Pain Patient Has Paino No Site Locations Pain Management and Medication Current Pain Management: Electronic Signature(s) Signed: 10/11/2018 4:43:08 PM By: Harold Barban Entered By: Harold Barban on  10/11/2018 13:05:34 [name] ([id number]) -------------------------------------------------------------------------------- Patient/Caregiver Education Details [name] Date of Service: 10/11/2018 1:00 PM Medical Record Number: [id number] Patient Account Number: [id number] Date of Birth/Gender: 06/07/1940 (78 y.o. M) Treating RN: Montey Hora Primary Care Physician: Lavone Orn Other Clinician: Referring Physician: Lavone Orn Treating Physician/Extender: Sharalyn Ink in Treatment: 9 Education Assessment Education Provided To: Patient and Caregiver Education Topics Provided Offloading: Handouts: Other: need for TCC Methods: Explain/Verbal Responses: State content correctly Electronic Signature(s) Signed: 10/11/2018 4:39:44 PM By: Montey Hora Entered By: Montey Hora on 10/11/2018 13:32:32 [name] ([id number]) -------------------------------------------------------------------------------- Wound Assessment Details [name]. Date of Service: 10/11/2018 1:00 PM Medical Record Number: [id number] Patient Account Number: [id number] Date of Birth/Sex: [birth year] (78 y.o. M) Treating RN: Harold Barban Primary Care Nancy Arvin: Lavone Orn Other Clinician: Referring Murl Golladay: Lavone Orn Treating Taygen Newsome/Extender: Melburn Hake, HOYT Weeks in Treatment: 9 Wound Status Wound Number: 2 Primary 2nd degree Burn Etiology: Wound Location: Left Foot - Plantar, Proximal Secondary Diabetic Wound/Ulcer of the Lower Extremity Wounding Event: Thermal Burn Etiology: Date Acquired: 07/29/2018 Wound Status: Open Weeks Of Treatment: 9 Comorbid Cataracts,  Glaucoma, Hypertension, Type II Clustered Wound: No History: Diabetes, Osteoarthritis, Neuropathy Photos Wound Measurements Length: (cm) 0.4 Width: (cm) 0.5 Depth: (cm) 0.2 Area: (cm) 0.157 Volume: (cm) 0.031 % Reduction in Area: 98.9% % Reduction in Volume:  97.8% Epithelialization: None Tunneling: No Undermining: No Wound Description Full Thickness Without Exposed Support Foul Odo Classification: Structures Slough/F Wound Margin: Distinct, outline attached Exudate Small Amount: r After Cleansing: No ibrino Yes Wound Bed Granulation Amount: Large (67-100%) Exposed Structure Granulation Quality: Pink Fascia Exposed: No Necrotic Amount: Small (1-33%) Fat Layer (Subcutaneous Tissue) Exposed: Yes Necrotic Quality: Adherent Slough Tendon Exposed: No Muscle Exposed: No Joint Exposed: No Bone Exposed: No Electronic Signature(s) JAEMIN, BABILONIA ([id number]) Signed: 10/14/2018 4:25:32 PM By: Worthy Keeler PA-C Signed: 10/28/2018 4:54:43 PM By: Harold Barban Previous Signature: 10/11/2018 4:43:08 PM Version By: Harold Barban Entered By: Worthy Keeler on 10/14/2018 16:25:31 Galloway, [name] ([id number]) -------------------------------------------------------------------------------- Vitals Details [name]. Date of Service: 10/11/2018 1:00 PM Medical Record Number: [id number] Patient Account Number: [id number] Date of Birth/Sex: Sep 25, 1940 (78 y.o. M) Treating RN: Harold Barban Primary Care Jodye Scali: Lavone Orn Other Clinician: Referring Diamante Truszkowski: Lavone Orn Treating Lalah Durango/Extender: Melburn Hake, HOYT Weeks in Treatment: 9 Vital Signs Time Taken: 13:05 Temperature (F): 98.4 Height (in): 69 Pulse (bpm): 95 Weight (lbs): 250 Respiratory Rate (breaths/min): 18 Body Mass Index (BMI): 36.9 Blood Pressure (mmHg): 154/76 Reference Range: 80 - 120 mg / dl Electronic Signature(s) Signed: 10/11/2018 4:43:08 PM By: Harold Barban Entered By: Harold Barban on 10/11/2018 13:06:14

## 2018-10-11 NOTE — Progress Notes (Addendum)
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 Bleeding: Minimum Hemostasis Achieved: Pressure End Time: 13:25 Procedural Pain: 0 Post Procedural Pain: 0 Response to Treatment: Procedure was tolerated well Level of Consciousness (Post-procedure): Awake and Alert Post Debridement Measurements of Total Wound Length: (cm) 0.4 Width: (cm) 0.5 Depth: (cm) 0.3 Volume: (cm) 0.047 Character of Wound/Ulcer Post Debridement: Improved Severity of Tissue Post Debridement: Fat layer exposed Post Procedure Diagnosis Same as Pre-procedure Plan Wound Cleansing: Wound #2 Left,Proximal,Plantar Foot: Dial antibacterial soap, wash wounds, rinse and pat dry prior to dressing wounds May Shower, gently pat wound dry prior to applying new dressing. Primary Wound Dressing: Wound #2 Left,Proximal,Plantar Foot: Silver Collagen Secondary Dressing: Wound #2 Left,Proximal,Plantar Foot: [place] Dressing Change Frequency: Wound #2 Left,Proximal,Plantar Foot: Change dressing every other day. Follow-up Appointments: Return Appointment in 1 week. Additional Orders /  Instructions: Other: - Do not walk without shoes at any time. Please wear protective gloves when picking up hot objects At this time we will get a go ahead and initiate treatment with felt in order to help with hopefully some additional offloading. Subsequently were also going to go ahead and continue with the silver collagen dressing for the time being. I will plan to see [name], HAMER ([id number]) him on Tuesday for a recheck and if he is not doing significantly better then we will plan to go ahead and initiate treatment with a total contact cast at that time on Tuesday. I do not want to do it today simply because were coming up on the weekend and I am not available through the weekend in case anything goes wrong with the cast. We will therefore plan to see him on Tuesday and Thursday of next week. We will see patient back for reevaluation in 1 week here in the clinic. If anything worsens or changes patient will contact our office for additional recommendations. Electronic Signature(s) Signed: 10/20/2018 10:46:39 PM By: Worthy Keeler PA-C Previous Signature: 10/14/2018 4:26:30 PM Version By: Worthy Keeler PA-C Previous Signature: 10/11/2018 1:34:49 PM Version By: Worthy Keeler PA-C Previous Signature: 10/11/2018 1:29:49 PM Version By: Worthy Keeler PA-C Entered By: Worthy Keeler on 10/20/2018 22:46:39 [place], [place] [name] ([id number]) -------------------------------------------------------------------------------- ROS/PFSH Details [name] Date of Service: 10/11/2018 1:00 PM Medical Record Number: [id number] Patient Account Number: [id number] Date of Birth/Sex: 12-07-1940 (78 y.o. M) Treating RN: Montey Hora Primary Care Provider: Lavone Orn Other Clinician: Referring Provider: Lavone Orn Treating Provider/Extender: Melburn Hake, HOYT Weeks in Treatment: 9 Information Obtained From Patient Constitutional Symptoms (General Health) Complaints and  Symptoms: Negative for: Fatigue; Fever; Chills; Marked Weight Change Respiratory Complaints and Symptoms: Negative for: Chronic or frequent coughs; Shortness of Breath Medical History: Negative for: Aspiration; Asthma; Chronic Obstructive Pulmonary Disease (COPD); Pneumothorax; Sleep Apnea; Tuberculosis Cardiovascular Complaints and Symptoms: Negative for: Chest pain; LE edema Medical History: Positive for: Hypertension Negative for: Angina; Arrhythmia; Congestive Heart Failure; Coronary Artery Disease; Hypotension; Myocardial Infarction; Peripheral Arterial Disease; Peripheral Venous Disease; Phlebitis; Vasculitis Psychiatric Complaints and Symptoms: Negative for: Anxiety; Claustrophobia Medical History: Negative for: Anorexia/bulimia; Confinement Anxiety Eyes Medical History: Positive for: Cataracts - both; Glaucoma Negative for:  Optic Neuritis Ear/Nose/Mouth/Throat Medical History: Negative for: Chronic sinus problems/congestion; Middle ear problems Hematologic/Lymphatic Medical History: Negative for: Anemia; Hemophilia; Human Immunodeficiency Virus; Lymphedema; Sickle Cell Disease LYNKOLN, WEIGMAN ([id number]) Gastrointestinal Medical History: Negative for: Cirrhosis ; Colitis; Crohnos; Hepatitis A; Hepatitis B; Hepatitis C Endocrine Medical History: Positive for: Type II Diabetes - ora agents Treated with: Oral agents Genitourinary Medical History: Negative for: End Stage Renal Disease Immunological Medical History: Negative for: Lupus Erythematosus; Raynaudos; Scleroderma Integumentary (Skin) Medical History: Negative for: History of Burn; History of pressure wounds Musculoskeletal Medical History: Positive for: Osteoarthritis - bilateral knees and ankles Negative for: Gout; Rheumatoid Arthritis; Osteomyelitis Neurologic Medical History: Positive for: Neuropathy Negative for: Dementia; Quadriplegia; Paraplegia; Seizure Disorder Oncologic Medical  History: Negative for: Received Chemotherapy; Received Radiation HBO Extended History Items Eyes: Eyes: Cataracts Glaucoma Immunizations Pneumococcal Vaccine: Received Pneumococcal Vaccination: Yes Implantable Devices None Family and Social History Cancer: No; Diabetes: No; Heart Disease: No; Hereditary Spherocytosis: No; Hypertension: No; Kidney Disease: No; Lung Disease: No; Seizures: No; Stroke: No; Thyroid Problems: No; Tuberculosis: No; Never smoker; Marital Status - Married; TAZ, WAGGLE. ([id number]) Alcohol Use: Never; Drug Use: No History; Caffeine Use: Moderate; Financial Concerns: No; Food, Clothing or Shelter Needs: No; Support System Lacking: No; Transportation Concerns: No Physician Affirmation I have reviewed and agree with the above information. Electronic Signature(s) Signed: 10/11/2018 4:32:36 PM By: Worthy Keeler PA-C Signed: 10/11/2018 4:39:44 PM By: Montey Hora Entered By: Worthy Keeler on 10/11/2018 13:28:29 BUKHARI, SERVEDIO ([id number]) -------------------------------------------------------------------------------- SuperBill Details [name] Date of Service: 10/11/2018 Medical Record Number: [id number] Patient Account Number: [id number] Date of Birth/Sex: [birth year] (78 y.o. M) Treating RN: Montey Hora Primary Care Provider: Lavone Orn Other Clinician: Referring Provider: Lavone Orn Treating Provider/Extender: Melburn Hake, HOYT Weeks in Treatment: 9 Diagnosis Coding ICD-10 Codes Code Description [id number] Burn of second degree of right foot, initial encounter T25.222A Burn of second degree of left foot, initial encounter E11.621 Type 2 diabetes mellitus with foot ulcer E11.40 Type 2 diabetes mellitus with diabetic neuropathy, unspecified T23.202A Burn of second degree of left hand, unspecified site, initial encounter I89.0 Lymphedema, not elsewhere classified L97.812 Non-pressure chronic ulcer of other part of right  lower leg with fat layer exposed Facility Procedures CPT4 Code Description: [id number] - BURN DRSG W/O ANESTH-SM ICD-10 Diagnosis Description T25.222A Burn of second degree of left foot, initial encounter 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

## 2018-10-15 ENCOUNTER — Other Ambulatory Visit: Payer: Self-pay

## 2018-10-15 ENCOUNTER — Encounter: Payer: Medicare Other | Admitting: Physician Assistant

## 2018-10-15 DIAGNOSIS — E11621 Type 2 diabetes mellitus with foot ulcer: Secondary | ICD-10-CM | POA: Diagnosis not present

## 2018-10-15 NOTE — Progress Notes (Addendum)
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 (General Health) Complaints and Symptoms: Negative for: Fatigue; Fever; Chills; Marked Weight Change Respiratory Complaints and Symptoms: Negative for: Chronic or frequent coughs; Shortness of Breath Medical History: Negative for: Aspiration; Asthma; Chronic Obstructive Pulmonary Disease (COPD); Pneumothorax; Sleep Apnea; Tuberculosis Cardiovascular Complaints and Symptoms: Negative for: Chest pain; LE edema Medical History: Positive for: Hypertension Negative for: Angina; Arrhythmia; Congestive Heart Failure; Coronary Artery Disease; Hypotension; Myocardial Infarction; Peripheral Arterial Disease; Peripheral Venous Disease; Phlebitis; Vasculitis Psychiatric Complaints and Symptoms: Negative for: Anxiety; Claustrophobia Medical History: Negative for: Anorexia/bulimia; Confinement Anxiety Eyes Medical History: Positive for: Cataracts - both; Glaucoma Negative for: Optic Neuritis Ear/Nose/Mouth/Throat Medical History: Negative for: Chronic sinus problems/congestion; Middle ear  problems Hematologic/Lymphatic Medical History: Negative for: Anemia; Hemophilia; Human Immunodeficiency Virus; Lymphedema; Sickle Cell Disease TALVIN, BOREY ([id number]) Gastrointestinal Medical History: Negative for: Cirrhosis ; Colitis; Crohnos; Hepatitis A; Hepatitis B; Hepatitis C Endocrine Medical History: Positive for: Type II Diabetes - ora agents Treated with: Oral agents Genitourinary Medical History: Negative for: End Stage Renal Disease Immunological Medical History: Negative for: Lupus Erythematosus; Raynaudos; Scleroderma Integumentary (Skin) Medical History: Negative for: History of Burn; History of pressure wounds Musculoskeletal Medical History: Positive for: Osteoarthritis - bilateral knees and ankles Negative for: Gout; Rheumatoid Arthritis; Osteomyelitis Neurologic Medical History: Positive for: Neuropathy Negative for: Dementia; Quadriplegia; Paraplegia; Seizure Disorder Oncologic Medical History: Negative for: Received Chemotherapy; Received Radiation HBO Extended History Items Eyes: Eyes: Cataracts Glaucoma Immunizations Pneumococcal Vaccine: Received Pneumococcal Vaccination: Yes Implantable Devices None Family and Social History Cancer: No; Diabetes: No; Heart Disease: No; Hereditary Spherocytosis: No; Hypertension: No; Kidney Disease: No; Lung Disease: No; Seizures: No; Stroke: No; Thyroid Problems: No; Tuberculosis: No; Never smoker; Marital Status - Married; CALIX, MCNEISH. ([id number]) Alcohol Use: Never; Drug Use: No History; Caffeine Use: Moderate; Financial Concerns: No; Food, Clothing or Shelter Needs: No; Support System Lacking: No; Transportation Concerns: No Physician Affirmation I have reviewed and agree with the above information. Electronic Signature(s) Signed: 10/15/2018 4:54:06 PM By: Montey Hora Signed: 10/15/2018 5:57:32 PM By: Worthy Keeler PA-C Entered By: Worthy Keeler on 10/15/2018 14:06:45 Streetman, [name]  ([id number]) -------------------------------------------------------------------------------- Total Contact Cast Details Patient Name: JAYCEN, LAMPI. Date of Service: 10/15/2018 1:30 PM Medical Record Number: [id number] Patient Account Number: [id number] Date of Birth/Sex: 1940-04-30 (78 y.o. M) Treating RN: Montey Hora Primary Care Provider: Laurann Montana,  JOHN Other Clinician: Referring Provider: Lavone Orn Treating Provider/Extender: Melburn Hake, HOYT Weeks in Treatment: 10 Total Contact Cast Applied for Wound Assessment: Wound #2 Left,Proximal,Plantar Foot Performed By: Physician Emilio Math., PA-C Post Procedure Diagnosis Same as Pre-procedure Electronic Signature(s) Signed: 10/15/2018 4:54:06 PM By: Montey Hora Signed: 10/15/2018 5:57:32 PM By: Worthy Keeler PA-C Entered By: Montey Hora on 10/15/2018 14:04:02 Mi, [name] ([id number]) -------------------------------------------------------------------------------- SuperBill Details [name]. Date of Service: 10/15/2018 Medical Record Number: [id number] Patient Account Number: [id number] Date of Birth/Sex: 1940-09-17 (78 y.o. M) Treating RN: Montey Hora Primary Care Provider: Lavone Orn Other Clinician: Referring Provider: Lavone Orn Treating Provider/Extender: Melburn Hake, HOYT Weeks in Treatment: 10 Diagnosis Coding ICD-10 Codes Code Description [id number] Burn of second degree of right foot, initial encounter T25.222A Burn of second degree of left foot, initial encounter E11.621 Type 2 diabetes mellitus with foot ulcer E11.40 Type 2 diabetes mellitus with diabetic neuropathy, unspecified T23.202A Burn of second degree of left hand, unspecified site, initial encounter I89.0 Lymphedema, not elsewhere classified L97.812 Non-pressure chronic ulcer of other part of right lower leg with fat layer exposed Facility Procedures CPT4 Code: [id number] Description: [phone] - APPLY TOTAL CONTACT LEG  CAST ICD-10 Diagnosis Description T25.222A Burn of second degree of left foot, initial encounter E11.621 Type 2 diabetes mellitus with foot ulcer Modifier: Quantity: 1 Physician Procedures CPT4 Code: [id number] Description: [id number] - WC PHYS APPLY TOTAL CONTACT CAST ICD-10 Diagnosis Description T25.222A Burn of second degree of left foot, initial encounter E11.621 Type 2 diabetes mellitus with foot ulcer Modifier: Quantity: 1 Electronic Signature(s) Signed: 10/15/2018 2:08:15 PM By: Worthy Keeler PA-C Entered By: Worthy Keeler on 10/15/2018 14:08:15

## 2018-10-16 NOTE — Progress Notes (Signed)
DEYMAR, NIVENS ([id number]) Visit Report for 10/15/2018 Arrival Information Details [name], [name]. Date of Service: 10/15/2018 1:30 PM Medical Record Number: [id number] Patient Account Number: [id number] Date of Birth/Sex: [birth year] (78 y.o. M) Treating RN: Army Melia Primary Care Meilah Delrosario: Lavone Orn Other Clinician: Referring Suhaib Guzzo: Lavone Orn Treating Tersea Aulds/Extender: Melburn Hake, HOYT Weeks in Treatment: 10 Visit Information History Since Last Visit Added or deleted any medications: No Patient Arrived: Ambulatory Any new allergies or adverse reactions: No Arrival Time: 13:40 Had a fall or experienced change in No Accompanied By: wife activities of daily living that may affect Transfer Assistance: None risk of falls: Signs or symptoms of abuse/neglect since last visito No Hospitalized since last visit: No Has Dressing in Place as Prescribed: Yes Pain Present Now: No Electronic Signature(s) Signed: 10/16/2018 11:23:01 AM By: Army Melia Entered By: Army Melia on 10/15/2018 13:40:40 [name], [name] ([id number]) -------------------------------------------------------------------------------- Encounter Discharge Information Details [name]. Date of Service: 10/15/2018 1:30 PM Medical Record Number: [id number] Patient Account Number: [id number] Date of Birth/Sex: 1940/12/25 (78 y.o. M) Treating RN: Montey Hora Primary Care Mansur Patti: Lavone Orn Other Clinician: Referring September Mormile: Lavone Orn Treating Siniyah Evangelist/Extender: Melburn Hake, HOYT Weeks in Treatment: 10 Encounter Discharge Information Items Discharge Condition: Stable Ambulatory Status: Walker Discharge Destination: Home Transportation: Private Auto Accompanied By: wife Schedule Follow-up Appointment: Yes Clinical Summary of Care: Electronic Signature(s) Signed: 10/15/2018 4:54:06 PM By: Montey Hora Entered By: Montey Hora on 10/15/2018 14:07:14 [name] ([id number]) -------------------------------------------------------------------------------- Lower Extremity Assessment Details [name]. Date of Service: 10/15/2018 1:30 PM Medical Record Number: [id number] Patient Account Number: [id number] Date of Birth/Sex: 1940/08/19 (78 y.o. M) Treating RN: Army Melia Primary Care Anja Neuzil: Lavone Orn Other Clinician: Referring Lurine Imel: Lavone Orn Treating Jahrel Borthwick/Extender: STONE III, HOYT Weeks in Treatment: 10 Edema Assessment Assessed: [Left: No] [Right: No] Edema: [Left: N] [Right: o] Vascular Assessment Pulses: Dorsalis Pedis Palpable: [Left:Yes] Electronic Signature(s) Signed: 10/16/2018 11:23:01 AM By: Army Melia Entered By: Army Melia on 10/15/2018 13:44:52 [name], [name] ([id number]) -------------------------------------------------------------------------------- Multi Wound Chart Details [name]. Date of Service: 10/15/2018 1:30 PM Medical Record Number: [id number] Patient Account Number: [id number] Date of Birth/Sex: 1940-07-11 (78 y.o. M) Treating RN: Montey Hora Primary Care Charna Neeb: Lavone Orn Other Clinician: Referring Kreg Earhart: Lavone Orn Treating Yeily Link/Extender: Melburn Hake, HOYT Weeks in Treatment: 10 Vital Signs Height(in): 69 Pulse(bpm): 51 Weight(lbs): 250 Blood Pressure(mmHg): 159/68 Body Mass Index(BMI): 37 Temperature(F): 98.1 Respiratory Rate 16 (breaths/min): Photos: [2:No Photos] [N/A:N/A] Wound Location: [2:Left Foot - Plantar, Proximal] [N/A:N/A] Wounding Event: [2:Thermal Burn] [N/A:N/A] Primary Etiology: [2:2nd degree Burn] [N/A:N/A] Secondary Etiology: [2:Diabetic Wound/Ulcer of the Lower Extremity] [N/A:N/A] Comorbid History: [2:Cataracts, Glaucoma, Hypertension, Type II Diabetes, Osteoarthritis, Neuropathy] [N/A:N/A] Date Acquired: [2:07/29/2018] [N/A:N/A] Weeks of Treatment: [2:10] [N/A:N/A] Wound Status: [2:Open]  [N/A:N/A] Measurements L x W x D [2:0.3x0.3x0.2] [N/A:N/A] (cm) Area (cm) : [2:0.071] [N/A:N/A] Volume (cm) : [2:0.014] [N/A:N/A] % Reduction in Area: [2:99.50%] [N/A:N/A] % Reduction in Volume: [2:99.00%] [N/A:N/A] Classification: [2:Full Thickness Without Exposed Support Structures] [N/A:N/A] Exudate Amount: [2:Small] [N/A:N/A] Wound Margin: [2:Distinct, outline attached] [N/A:N/A] Granulation Amount: [2:Medium (34-66%)] [N/A:N/A] Granulation Quality: [2:Pink] [N/A:N/A] Necrotic Amount: [2:Medium (34-66%)] [N/A:N/A] Necrotic Tissue: [2:Eschar] [N/A:N/A] Exposed Structures: [2:Fat Layer (Subcutaneous Tissue) Exposed: Yes Fascia: No Tendon: No Muscle: No Joint: No Bone: No None] [N/A:N/A N/A] Treatment Notes [name], [name] ([id number]) Electronic Signature(s) Signed: 10/15/2018 4:54:06 PM By: Montey Hora Entered By: Montey Hora on 10/15/2018 14:03:52 [name], [name] ([id number]) --------------------------------------------------------------------------------  Multi-Disciplinary Care Plan Details [name], [name]. Date of Service: 10/15/2018 1:30 PM Medical Record Number: [id number] Patient Account Number: [id number] Date of Birth/Sex: [birth year] (78 y.o. M) Treating RN: Montey Hora Primary Care Gianlucas Evenson: Lavone Orn Other Clinician: Referring Burgess Sheriff: Lavone Orn Treating Vernard Gram/Extender: Melburn Hake, HOYT Weeks in Treatment: 10 Active Inactive Abuse / Safety / Falls / Self Care Management Nursing Diagnoses: Potential for falls Goals: Patient will not experience any injury related to falls Date Initiated: 08/06/2018 Target Resolution Date: 10/19/2018 Goal Status: Active Interventions: Assess fall risk on admission and as needed Notes: Orientation to the Wound Care Program Nursing Diagnoses: Knowledge deficit related to the wound healing center program Goals: Patient/caregiver will verbalize understanding of the [facility] Program Date  Initiated: 08/06/2018 Target Resolution Date: 10/19/2018 Goal Status: Active Interventions: Provide education on orientation to the wound center Notes: Peripheral Neuropathy Nursing Diagnoses: Knowledge deficit related to disease process and management of peripheral neurovascular dysfunction Goals: Patient/caregiver will verbalize understanding of disease process and disease management Date Initiated: 08/06/2018 Target Resolution Date: 10/19/2018 Goal Status: Active Interventions: Provide education on Management of Neuropathy and Related Ulcers [name], [name] ([id number]) Notes: Wound/Skin Impairment Nursing Diagnoses: Impaired tissue integrity Goals: Ulcer/skin breakdown will heal within 14 weeks Date Initiated: 08/06/2018 Target Resolution Date: 10/19/2018 Goal Status: Active Interventions: Assess patient/caregiver ability to obtain necessary supplies Assess patient/caregiver ability to perform ulcer/skin care regimen upon admission and as needed Assess ulceration(s) every visit Notes: Electronic Signature(s) Signed: 10/15/2018 4:54:06 PM By: Montey Hora Entered By: Montey Hora on 10/15/2018 14:03:43 [name], [name] ([id number]) -------------------------------------------------------------------------------- Pain Assessment Details [name]. Date of Service: 10/15/2018 1:30 PM Medical Record Number: [id number] Patient Account Number: [id number] Date of Birth/Sex: [birth year] (78 y.o. M) Treating RN: Army Melia Primary Care Kyion Gautier: Lavone Orn Other Clinician: Referring Jaszmine Navejas: Lavone Orn Treating Joyceline Maiorino/Extender: Melburn Hake, HOYT Weeks in Treatment: 10 Active Problems Location of Pain Severity and Description of Pain Patient Has Paino No Site Locations Pain Management and Medication Current Pain Management: Electronic Signature(s) Signed: 10/16/2018 11:23:01 AM By: Army Melia Entered By: Army Melia on 10/15/2018 13:40:49 [name],  [name] ([id number]) -------------------------------------------------------------------------------- Patient/Caregiver Education Details [name] Date of Service: 10/15/2018 1:30 PM Medical Record Number: [id number] Patient Account Number: [id number] Date of Birth/Gender: 11-04-1940 (78 y.o. M) Treating RN: Montey Hora Primary Care Physician: Lavone Orn Other Clinician: Referring Physician: Lavone Orn Treating Physician/Extender: Sharalyn Ink in Treatment: 10 Education Assessment Education Provided To: Patient and Caregiver Education Topics Provided Offloading: Handouts: Other: TCC precautions Methods: Explain/Verbal Responses: State content correctly Electronic Signature(s) Signed: 10/15/2018 4:54:06 PM By: Montey Hora Entered By: Montey Hora on 10/15/2018 14:06:37 [name], [name] ([id number]) -------------------------------------------------------------------------------- Wound Assessment Details [name]. Date of Service: 10/15/2018 1:30 PM Medical Record Number: [id number] Patient Account Number: [id number] Date of Birth/Sex: 11-27-1940 (78 y.o. M) Treating RN: Army Melia Primary Care Jeny Nield: Lavone Orn Other Clinician: Referring Shyana Kulakowski: Lavone Orn Treating Bryse Blanchette/Extender: Melburn Hake, HOYT Weeks in Treatment: 10 Wound Status Wound Number: 2 Primary 2nd degree Burn Etiology: Wound Location: Left Foot - Plantar, Proximal Secondary Diabetic Wound/Ulcer of the Lower Extremity Wounding Event: Thermal Burn Etiology: Date Acquired: 07/29/2018 Wound Status: Open Weeks Of Treatment: 10 Comorbid Cataracts, Glaucoma, Hypertension, Type II Clustered Wound: No History: Diabetes, Osteoarthritis, Neuropathy Wound Measurements Length: (cm) 0.3 Width: (cm) 0.3 Depth: (cm) 0.2 Area: (cm) 0.071 Volume: (cm) 0.014 % Reduction in Area: 99.5% % Reduction in Volume: 99% Epithelialization:  None Tunneling: No Undermining: No Wound Description Full Thickness Without Exposed Support Classification: Structures Wound Margin: Distinct, outline attached Exudate Small Amount: Foul Odor After Cleansing: No Slough/Fibrino Yes Wound Bed Granulation Amount: Medium (34-66%) Exposed Structure Granulation Quality: Pink Fascia Exposed: No Necrotic Amount: Medium (34-66%) Fat Layer (Subcutaneous Tissue) Exposed: Yes Necrotic Quality: Eschar Tendon Exposed: No Muscle Exposed: No Joint Exposed: No Bone Exposed: No Treatment Notes Wound #2 (Left, Proximal, Plantar Foot) Notes prisma, foam and TCC Electronic Signature(s) Signed: 10/16/2018 11:23:01 AM By: Army Melia Entered By: Army Melia on 10/15/2018 13:44:41 [name], [name] ([id number]) -------------------------------------------------------------------------------- Vitals Details [name]. Date of Service: 10/15/2018 1:30 PM Medical Record Number: [id number] Patient Account Number: [id number] Date of Birth/Sex: [birth year] (78 y.o. M) Treating RN: Army Melia Primary Care Markesha Hannig: Lavone Orn Other Clinician: Referring Julizza Sassone: Lavone Orn Treating Garnet Overfield/Extender: Melburn Hake, HOYT Weeks in Treatment: 10 Vital Signs Time Taken: 13:40 Temperature (F): 98.1 Height (in): 69 Pulse (bpm): 82 Weight (lbs): 250 Respiratory Rate (breaths/min): 16 Body Mass Index (BMI): 36.9 Blood Pressure (mmHg): 159/68 Reference Range: 80 - 120 mg / dl Electronic Signature(s) Signed: 10/16/2018 11:23:01 AM By: Army Melia Entered By: Army Melia on 10/15/2018 13:42:20

## 2018-10-18 ENCOUNTER — Other Ambulatory Visit: Payer: Self-pay

## 2018-10-18 ENCOUNTER — Encounter: Payer: Medicare Other | Admitting: Physician Assistant

## 2018-10-18 DIAGNOSIS — E11621 Type 2 diabetes mellitus with foot ulcer: Secondary | ICD-10-CM | POA: Diagnosis not present

## 2018-10-18 NOTE — Progress Notes (Addendum)
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 meantime he will contact the office and let me know. [name], [name] ([id number]) We will see patient back for reevaluation in 1 week here in the clinic. If anything worsens or changes patient will contact our office for additional recommendations. Electronic Signature(s) Signed: 10/18/2018 1:51:32 PM By: Worthy Keeler PA-C Entered By: Worthy Keeler on 10/18/2018 13:51:31 [name], [name] ([id number]) -------------------------------------------------------------------------------- ROS/PFSH Details [name] Date of Service: 10/18/2018 1:00 PM Medical Record Number: [id number] Patient Account Number: [id number] Date of Birth/Sex: [birth year] (78 y.o. M) Treating RN: Montey Hora Primary Care Provider: Lavone Orn Other Clinician: Referring Provider: Lavone Orn Treating Provider/Extender: Melburn Hake, Babara Buffalo Weeks in Treatment: 10 Information Obtained From Patient Constitutional Symptoms (General Health) Complaints and Symptoms: Negative for: Fatigue; Fever; Chills; Marked Weight Change Respiratory Complaints and Symptoms: Negative for: Chronic or frequent coughs; Shortness of Breath Medical History: Negative for: Aspiration; Asthma; Chronic  Obstructive Pulmonary Disease (COPD); Pneumothorax; Sleep Apnea; Tuberculosis Cardiovascular Complaints and Symptoms: Negative for: Chest pain; LE edema Medical History: Positive for: Hypertension Negative for: Angina; Arrhythmia; Congestive Heart Failure; Coronary Artery Disease; Hypotension; Myocardial Infarction; Peripheral Arterial Disease; Peripheral Venous Disease; Phlebitis; Vasculitis Psychiatric Complaints and Symptoms: Negative for: Anxiety; Claustrophobia Medical History: Negative for: Anorexia/bulimia; Confinement Anxiety Eyes Medical History: Positive for: Cataracts - both; Glaucoma Negative for: Optic Neuritis Ear/Nose/Mouth/Throat Medical History: Negative for: Chronic sinus problems/congestion; Middle ear problems Hematologic/Lymphatic Medical History: Negative for: Anemia; Hemophilia; Human Immunodeficiency Virus; Lymphedema; Sickle Cell Disease [name], [name] ([id number]) Gastrointestinal Medical History: Negative for: Cirrhosis ; Colitis; Crohnos; Hepatitis A; Hepatitis B; Hepatitis C Endocrine Medical History: Positive for: Type II Diabetes - ora agents Treated with: Oral agents Genitourinary Medical History: Negative for: End Stage Renal Disease Immunological Medical History: Negative for: Lupus Erythematosus; Raynaudos; Scleroderma Integumentary (Skin) Medical History: Negative for: History of Burn; History of pressure wounds Musculoskeletal Medical History: Positive for: Osteoarthritis - bilateral knees and ankles Negative for: Gout; Rheumatoid Arthritis; Osteomyelitis Neurologic Medical History: Positive for: Neuropathy Negative for: Dementia; Quadriplegia; Paraplegia; Seizure Disorder Oncologic Medical History: Negative for: Received Chemotherapy; Received Radiation HBO Extended History Items Eyes: Eyes: Cataracts Glaucoma Immunizations Pneumococcal Vaccine: Received Pneumococcal Vaccination: Yes Implantable Devices None Family and  Social History Cancer:  No; Diabetes: No; Heart Disease: No; Hereditary Spherocytosis: No; Hypertension: No; Kidney Disease: No; Lung Disease: No; Seizures: No; Stroke: No; Thyroid Problems: No; Tuberculosis: No; Never smoker; Marital Status - Married; [name], [name]. ([id number]) Alcohol Use: Never; Drug Use: No History; Caffeine Use: Moderate; Financial Concerns: No; Food, Clothing or Shelter Needs: No; Support System Lacking: No; Transportation Concerns: No Physician Affirmation I have reviewed and agree with the above information. Electronic Signature(s) Signed: 10/18/2018 4:36:34 PM By: Montey Hora Signed: 10/18/2018 5:06:41 PM By: Worthy Keeler PA-C Entered By: Worthy Keeler on 10/18/2018 13:50:39 Chaffin, [name] ([id number]) -------------------------------------------------------------------------------- Total Contact Cast Details Patient Name: GEOVANNIE, MACNAB. Date of Service: 10/18/2018 1:00 PM Medical Record Number: [id number] Patient Account Number: [id number] Date of Birth/Sex: Aug 14, 1940 (78 y.o. M) Treating RN: Montey Hora Primary Care Provider: Lavone Orn Other Clinician: Referring Provider: Lavone Orn Treating Provider/Extender: Melburn Hake, Arney Mayabb Weeks in Treatment: 10 Total Contact Cast Applied for Wound Assessment: Wound #2 Left,Proximal,Plantar Foot Performed By: Physician Emilio Math., PA-C Post Procedure Diagnosis Same as Pre-procedure Electronic Signature(s) Signed: 10/18/2018 4:36:34 PM By: Montey Hora Signed: 10/18/2018 5:06:41 PM By: Worthy Keeler PA-C Entered By: Montey Hora on 10/18/2018 13:37:35 Kotas, [name] ([id number]) -------------------------------------------------------------------------------- SuperBill Details [name]. Date of Service: 10/18/2018 Medical Record Number: [id number] Patient Account Number: [id number] Date of Birth/Sex: 08-Jul-1940 (78 y.o. M) Treating RN: Montey Hora Primary Care  Provider: Lavone Orn Other Clinician: Referring Provider: Lavone Orn Treating Provider/Extender: Melburn Hake, Seif Teichert Weeks in Treatment: 10 Diagnosis Coding ICD-10 Codes Code Description [id number] Burn of second degree of right foot, initial encounter T25.222A Burn of second degree of left foot, initial encounter E11.621 Type 2 diabetes mellitus with foot ulcer E11.40 Type 2 diabetes mellitus with diabetic neuropathy, unspecified T23.202A Burn of second degree of left hand, unspecified site, initial encounter I89.0 Lymphedema, not elsewhere classified L97.812 Non-pressure chronic ulcer of other part of right lower leg with fat layer exposed Facility Procedures CPT4 Code: [id number] Description: [id number] - APPLY TOTAL CONTACT LEG CAST ICD-10 Diagnosis Description T25.222A Burn of second degree of left foot, initial encounter E11.621 Type 2 diabetes mellitus with foot ulcer Modifier: Quantity: 1 Physician Procedures CPT4 Code: [id number] Description: [id number] - WC PHYS APPLY TOTAL CONTACT CAST ICD-10 Diagnosis Description T25.222A Burn of second degree of left foot, initial encounter E11.621 Type 2 diabetes mellitus with foot ulcer Modifier: Quantity: 1 Electronic Signature(s) Signed: 10/18/2018 1:51:39 PM By: Worthy Keeler PA-C Entered By: Worthy Keeler on 10/18/2018 13:51:38

## 2018-10-18 NOTE — Progress Notes (Signed)
Dennis, Kennedy ([id number]) Visit Report for 10/18/2018 Arrival Information Details [name], [name]. Date of Service: 10/18/2018 1:00 PM Medical Record Number: [id number] Patient Account Number: [id number] Date of Birth/Sex: 09-27-1940 (78 y.o. M) Treating RN: Cornell Barman Primary Care Davena Julian: Lavone Orn Other Clinician: Referring Tino Ronan: Lavone Orn Treating Klaryssa Fauth/Extender: Melburn Hake, HOYT Weeks in Treatment: 10 Visit Information History Since Last Visit Added or deleted any medications: No Patient Arrived: Walker Any new allergies or adverse reactions: No Arrival Time: 13:06 Had a fall or experienced change in No Accompanied By: wife activities of daily living that may affect Transfer Assistance: None risk of falls: Patient Identification Verified: Yes Signs or symptoms of abuse/neglect since last visito No Secondary Verification Process Completed: Yes Hospitalized since last visit: No Implantable device outside of the clinic excluding No cellular tissue based products placed in the center since last visit: Pain Present Now: No Electronic Signature(s) Signed: 10/18/2018 4:44:03 PM By: Gretta Cool, BSN, RN, CWS, Kim RN, BSN Entered By: Gretta Cool, BSN, RN, CWS, Kim on 10/18/2018 13:07:18 [name] ([id number]) -------------------------------------------------------------------------------- Encounter Discharge Information Details Patient Name: Dennis, Kennedy. Date of Service: 10/18/2018 1:00 PM Medical Record Number: [id number] Patient Account Number: [id number] Date of Birth/Sex: 08/19/1940 (78 y.o. M) Treating RN: Montey Hora Primary Care Joana Nolton: Lavone Orn Other Clinician: Referring Bell Carbo: Lavone Orn Treating Shavon Ashmore/Extender: Melburn Hake, HOYT Weeks in Treatment: 10 Encounter Discharge Information Items Discharge Condition: Stable Ambulatory Status: Walker Discharge Destination: Home Transportation: Private Auto Accompanied By:  wife Schedule Follow-up Appointment: Yes Clinical Summary of Care: Electronic Signature(s) Signed: 10/18/2018 4:36:34 PM By: Montey Hora Entered By: Montey Hora on 10/18/2018 13:39:46 Gherardi, [name] ([id number]) -------------------------------------------------------------------------------- Lower Extremity Assessment Details [name]. Date of Service: 10/18/2018 1:00 PM Medical Record Number: [id number] Patient Account Number: [id number] Date of Birth/Sex: 09-Nov-1940 (78 y.o. M) Treating RN: Cornell Barman Primary Care Zurich Carreno: Lavone Orn Other Clinician: Referring Janeice Stegall: Lavone Orn Treating Brendan Gruwell/Extender: Melburn Hake, HOYT Weeks in Treatment: 10 Vascular Assessment Pulses: Dorsalis Pedis Palpable: [Left:Yes] Electronic Signature(s) Signed: 10/18/2018 4:44:03 PM By: Gretta Cool, BSN, RN, CWS, Kim RN, BSN Entered By: Gretta Cool, BSN, RN, CWS, Kim on 10/18/2018 13:21:23 KIREN, HAVRON ([id number]) -------------------------------------------------------------------------------- Multi Wound Chart Details Patient Name: Dennis, Kennedy. Date of Service: 10/18/2018 1:00 PM Medical Record Number: [id number] Patient Account Number: [id number] Date of Birth/Sex: 1940/04/27 (78 y.o. M) Treating RN: Montey Hora Primary Care Viviano Bir: Lavone Orn Other Clinician: Referring Yazhini Mcaulay: Lavone Orn Treating Oliveah Zwack/Extender: Melburn Hake, HOYT Weeks in Treatment: 10 Vital Signs Height(in): 69 Pulse(bpm): 103 Weight(lbs): 250 Blood Pressure(mmHg): 151/78 Body Mass Index(BMI): 37 Temperature(F): 98.4 Respiratory Rate 16 (breaths/min): Photos: [N/A:N/A] Wound Location: Left Foot - Plantar, Proximal N/A N/A Wounding Event: Thermal Burn N/A N/A Primary Etiology: 2nd degree Burn N/A N/A Secondary Etiology: Diabetic Wound/Ulcer of the N/A N/A Lower Extremity Comorbid History: Cataracts, Glaucoma, N/A N/A Hypertension, Type II Diabetes,  Osteoarthritis, Neuropathy Date Acquired: 07/29/2018 N/A N/A Weeks of Treatment: 10 N/A N/A Wound Status: Open N/A N/A Measurements L x W x D 0.2x0.3x0.2 N/A N/A (cm) Area (cm) : 0.047 N/A N/A Volume (cm) : 0.009 N/A N/A % Reduction in Area: 99.70% N/A N/A % Reduction in Volume: 99.30% N/A N/A Starting Position 1 12 (o'clock): Ending Position 1 12 (o'clock): Maximum Distance 1 (cm): 0.2 Undermining: Yes N/A N/A Classification: Full Thickness Without N/A N/A Exposed Support Structures Exudate Amount: Medium N/A N/A Exudate Type: Sanguinous N/A N/A Exudate Color: red N/A N/A EUCLIDES, MIFSUD ([id number]) Wound  Margin: Distinct, outline attached N/A N/A Granulation Amount: Large (67-100%) N/A N/A Granulation Quality: Pink N/A N/A Necrotic Amount: Small (1-33%) N/A N/A Necrotic Tissue: Eschar N/A N/A Exposed Structures: Fat Layer (Subcutaneous N/A N/A Tissue) Exposed: Yes Fascia: No Tendon: No Muscle: No Joint: No Bone: No Epithelialization: None N/A N/A Treatment Notes Electronic Signature(s) Signed: 10/18/2018 4:36:34 PM By: Montey Hora Entered By: Montey Hora on 10/18/2018 13:37:25 [name] ([id number]) -------------------------------------------------------------------------------- Multi-Disciplinary Care Plan Details [name]. Date of Service: 10/18/2018 1:00 PM Medical Record Number: [id number] Patient Account Number: [id number] Date of Birth/Sex: Dec 04, 1940 (78 y.o. M) Treating RN: Montey Hora Primary Care Andretta Ergle: Lavone Orn Other Clinician: Referring Jachin Coury: Lavone Orn Treating Torrence Hammack/Extender: Melburn Hake, HOYT Weeks in Treatment: 10 Active Inactive Abuse / Safety / Falls / Self Care Management Nursing Diagnoses: Potential for falls Goals: Patient will not experience any injury related to falls Date Initiated: 08/06/2018 Target Resolution Date: 10/19/2018 Goal Status: Active Interventions: Assess fall risk on  admission and as needed Notes: Orientation to the Wound Care Program Nursing Diagnoses: Knowledge deficit related to the wound healing center program Goals: Patient/caregiver will verbalize understanding of the [place] Program Date Initiated: 08/06/2018 Target Resolution Date: 10/19/2018 Goal Status: Active Interventions: Provide education on orientation to the wound center Notes: Peripheral Neuropathy Nursing Diagnoses: Knowledge deficit related to disease process and management of peripheral neurovascular dysfunction Goals: Patient/caregiver will verbalize understanding of disease process and disease management Date Initiated: 08/06/2018 Target Resolution Date: 10/19/2018 Goal Status: Active Interventions: Provide education on Management of Neuropathy and Related Ulcers KAMEL, DUNMAN ([id number]) Notes: Wound/Skin Impairment Nursing Diagnoses: Impaired tissue integrity Goals: Ulcer/skin breakdown will heal within 14 weeks Date Initiated: 08/06/2018 Target Resolution Date: 10/19/2018 Goal Status: Active Interventions: Assess patient/caregiver ability to obtain necessary supplies Assess patient/caregiver ability to perform ulcer/skin care regimen upon admission and as needed Assess ulceration(s) every visit Notes: Electronic Signature(s) Signed: 10/18/2018 4:36:34 PM By: Montey Hora Entered By: Montey Hora on 10/18/2018 13:37:17 Blyth, [name] ([id number]) -------------------------------------------------------------------------------- Pain Assessment Details [name]. Date of Service: 10/18/2018 1:00 PM Medical Record Number: [id number] Patient Account Number: [id number] Date of Birth/Sex: 05-11-1940 (78 y.o. M) Treating RN: Cornell Barman Primary Care Chinita Schimpf: Lavone Orn Other Clinician: Referring Camryn Quesinberry: Lavone Orn Treating Roda Lauture/Extender: Melburn Hake, HOYT Weeks in Treatment: 10 Active Problems Location of Pain  Severity and Description of Pain Patient Has Paino No Site Locations Pain Management and Medication Current Pain Management: Electronic Signature(s) Signed: 10/18/2018 4:44:03 PM By: Gretta Cool, BSN, RN, CWS, Kim RN, BSN Entered By: Gretta Cool, BSN, RN, CWS, Kim on 10/18/2018 13:07:23 [name] ([id number]) -------------------------------------------------------------------------------- Patient/Caregiver Education Details [name] Date of Service: 10/18/2018 1:00 PM Medical Record Number: [id number] Patient Account Number: [id number] Date of Birth/Gender: 03/17/1940 (78 y.o. M) Treating RN: Montey Hora Primary Care Physician: Lavone Orn Other Clinician: Referring Physician: Lavone Orn Treating Physician/Extender: Sharalyn Ink in Treatment: 10 Education Assessment Education Provided To: Patient and Caregiver Education Topics Provided Offloading: Handouts: Other: need for offloading for healing Methods: Explain/Verbal Responses: State content correctly Electronic Signature(s) Signed: 10/18/2018 4:36:34 PM By: Montey Hora Entered By: Montey Hora on 10/18/2018 13:39:11 Brew, [name] ([id number]) -------------------------------------------------------------------------------- Wound Assessment Details [name]. Date of Service: 10/18/2018 1:00 PM Medical Record Number: [id number] Patient Account Number: [id number] Date of Birth/Sex: 22-Jan-1940 (78 y.o. M) Treating RN: Cornell Barman Primary Care Aimy Sweeting: Lavone Orn Other Clinician: Referring Andera Cranmer: Lavone Orn Treating Damascus Feldpausch/Extender: Joaquim Lai  III, HOYT Weeks in Treatment: 10 Wound Status Wound Number: 2 Primary 2nd degree Burn Etiology: Wound Location: Left Foot - Plantar, Proximal Secondary Diabetic Wound/Ulcer of the Lower Extremity Wounding Event: Thermal Burn Etiology: Date Acquired: 07/29/2018 Wound Status: Open Weeks Of Treatment: 10 Comorbid Cataracts,  Glaucoma, Hypertension, Type II Clustered Wound: No History: Diabetes, Osteoarthritis, Neuropathy Photos Wound Measurements Length: (cm) 0.2 Width: (cm) 0.3 Depth: (cm) 0.2 Area: (cm) 0.047 Volume: (cm) 0.009 % Reduction in Area: 99.7% % Reduction in Volume: 99.3% Epithelialization: None Tunneling: No Undermining: Yes Starting Position (o'clock): 12 Ending Position (o'clock): 12 Maximum Distance: (cm) 0.2 Wound Description Full Thickness Without Exposed Support Foul Od Classification: Structures Slough/ Wound Margin: Distinct, outline attached Exudate Medium Amount: Exudate Type: Sanguinous Exudate Color: red or After Cleansing: No Fibrino Yes Wound Bed Granulation Amount: Large (67-100%) Exposed Structure Granulation Quality: Pink Fascia Exposed: No Necrotic Amount: Small (1-33%) Fat Layer (Subcutaneous Tissue) Exposed: Yes Necrotic Quality: Eschar Tendon Exposed: No Dillen, [name] ([id number]) Muscle Exposed: No Joint Exposed: No Bone Exposed: No Treatment Notes Wound #2 (Left, Proximal, Plantar Foot) Notes prisma, foam and TCC Electronic Signature(s) Signed: 10/18/2018 4:44:03 PM By: Gretta Cool, BSN, RN, CWS, Kim RN, BSN Entered By: Gretta Cool, BSN, RN, CWS, Kim on 10/18/2018 13:19:53 Betzer, [name] ([id number]) -------------------------------------------------------------------------------- [place] Details [name]. Date of Service: 10/18/2018 1:00 PM Medical Record Number: [id number] Patient Account Number: [id number] Date of Birth/Sex: [birth year] (78 y.o. M) Treating RN: Cornell Barman Primary Care Kori Goins: Lavone Orn Other Clinician: Referring Bradely Rudin: Lavone Orn Treating Nasha Diss/Extender: Melburn Hake, HOYT Weeks in Treatment: 10 Vital Signs Time Taken: 13:07 Temperature (F): 98.4 Height (in): 69 Pulse (bpm): 103 Weight (lbs): 250 Respiratory Rate (breaths/min): 16 Body Mass Index (BMI): 36.9 Blood Pressure (mmHg):  151/78 Reference Range: 80 - 120 mg / dl Electronic Signature(s) Signed: 10/18/2018 4:44:03 PM By: Gretta Cool, BSN, RN, CWS, Kim RN, BSN Entered By: Gretta Cool, BSN, RN, CWS, Kim on 10/18/2018 13:08:06

## 2018-10-22 ENCOUNTER — Encounter: Payer: Medicare Other | Admitting: Physician Assistant

## 2018-10-22 ENCOUNTER — Other Ambulatory Visit: Payer: Self-pay

## 2018-10-22 DIAGNOSIS — E11621 Type 2 diabetes mellitus with foot ulcer: Secondary | ICD-10-CM | POA: Diagnosis not present

## 2018-10-22 NOTE — Progress Notes (Addendum)
OJI, CASAGRANDE ([id number]) Visit Report for 10/22/2018 Arrival Information Details [name], [name]. Date of Service: 10/22/2018 2:30 PM Medical Record Number: [id number] Patient Account Number: [id number] Date of Birth/Sex: 10-13-1940 (78 y.o. M) Treating RN: Montey Hora Primary Care Kolbi Tofte: Lavone Orn Other Clinician: Referring Quest Tavenner: Lavone Orn Treating Chaia Ikard/Extender: Melburn Hake, HOYT Weeks in Treatment: 11 Visit Information History Since Last Visit Added or deleted any medications: No Patient Arrived: Ambulatory Any new allergies or adverse reactions: No Arrival Time: 14:30 Had a fall or experienced change in No Accompanied By: wife activities of daily living that may affect Transfer Assistance: None risk of falls: Patient Identification Verified: Yes Signs or symptoms of abuse/neglect since last visito No Secondary Verification Process Completed: Yes Hospitalized since last visit: No Implantable device outside of the clinic excluding No cellular tissue based products placed in the center since last visit: Has Dressing in Place as Prescribed: Yes Pain Present Now: Yes Electronic Signature(s) Signed: 10/22/2018 4:59:02 PM By: Lorine Bears RCP, RRT, CHT Entered By: Lorine Bears on 10/22/2018 14:32:37 Harju, [name] ([id number]) -------------------------------------------------------------------------------- Encounter Discharge Information Details [name]. Date of Service: 10/22/2018 2:30 PM Medical Record Number: [id number] Patient Account Number: [id number] Date of Birth/Sex: [birth year] (78 y.o. M) Treating RN: Montey Hora Primary Care Makella Buckingham: Lavone Orn Other Clinician: Referring Renne Platts: Lavone Orn Treating Leocadia Idleman/Extender: Melburn Hake, HOYT Weeks in Treatment: 11 Encounter Discharge Information Items Discharge Condition: Stable Ambulatory Status: Walker Discharge  Destination: Home Transportation: Private Auto Accompanied By: wife Schedule Follow-up Appointment: Yes Clinical Summary of Care: Electronic Signature(s) Signed: 10/22/2018 5:32:28 PM By: Montey Hora Entered By: Montey Hora on 10/22/2018 15:34:41 Bleecker, [name] ([id number]) -------------------------------------------------------------------------------- Lower Extremity Assessment Details [name]. Date of Service: 10/22/2018 2:30 PM Medical Record Number: [id number] Patient Account Number: [id number] Date of Birth/Sex: 04-02-1940 (78 y.o. M) Treating RN: Harold Barban Primary Care Alezander Dimaano: Lavone Orn Other Clinician: Referring Dnyla Antonetti: Lavone Orn Treating Chase Arnall/Extender: Melburn Hake, HOYT Weeks in Treatment: 11 Edema Assessment Assessed: [Left: No] [Right: No] [Left: Edema] [Right: :] Calf Left: Right: Point of Measurement: 34 cm From Medial Instep 38.5 cm cm Ankle Left: Right: Point of Measurement: 12 cm From Medial Instep 23 cm cm Vascular Assessment Pulses: Dorsalis Pedis Palpable: [Left:Yes] Posterior Tibial Palpable: [Left:Yes] Electronic Signature(s) Signed: 10/23/2018 4:37:37 PM By: Harold Barban Entered By: Harold Barban on 10/22/2018 14:54:46 Cary, [name] ([id number]) -------------------------------------------------------------------------------- Multi Wound Chart Details [name]. Date of Service: 10/22/2018 2:30 PM Medical Record Number: [id number] Patient Account Number: [id number] Date of Birth/Sex: [birth year] (78 y.o. M) Treating RN: Montey Hora Primary Care Kadrian Partch: Lavone Orn Other Clinician: Referring Fabienne Nolasco: Lavone Orn Treating Baraa Tubbs/Extender: Melburn Hake, HOYT Weeks in Treatment: 11 Vital Signs Height(in): 69 Pulse(bpm): 96 Weight(lbs): 250 Blood Pressure(mmHg): 120/80 Body Mass Index(BMI): 37 Temperature(F): 98.9 Respiratory Rate 18 (breaths/min): Photos:  [N/A:N/A] Wound Location: Left Foot - Plantar, Proximal N/A N/A Wounding Event: Thermal Burn N/A N/A Primary Etiology: 2nd degree Burn N/A N/A Secondary Etiology: Diabetic Wound/Ulcer of the N/A N/A Lower Extremity Comorbid History: Cataracts, Glaucoma, N/A N/A Hypertension, Type II Diabetes, Osteoarthritis, Neuropathy Date Acquired: 07/29/2018 N/A N/A Weeks of Treatment: 11 N/A N/A Wound Status: Open N/A N/A Measurements L x W x D 0.3x0.3x0.4 N/A N/A (cm) Area (cm) : 0.071 N/A N/A Volume (cm) : 0.028 N/A N/A % Reduction in Area: 99.50% N/A N/A % Reduction in Volume: 98.00% N/A N/A Classification: Full Thickness Without N/A N/A Exposed Support Structures  Exudate Amount: Medium N/A N/A Exudate Type: Purulent N/A N/A Exudate Color: yellow, brown, green N/A N/A Wound Margin: Distinct, outline attached N/A N/A Granulation Amount: Large (67-100%) N/A N/A Granulation Quality: Pink N/A N/A Necrotic Amount: Small (1-33%) N/A N/A Necrotic Tissue: Eschar N/A N/A Exposed Structures: N/A N/A [name], [name] ([id number]) Fat Layer (Subcutaneous Tissue) Exposed: Yes Fascia: No Tendon: No Muscle: No Joint: No Bone: No Epithelialization: None N/A N/A Treatment Notes Electronic Signature(s) Signed: 10/22/2018 5:32:28 PM By: Montey Hora Entered By: Montey Hora on 10/22/2018 15:27:08 [name] ([id number]) -------------------------------------------------------------------------------- Multi-Disciplinary Care Plan Details [name]. Date of Service: 10/22/2018 2:30 PM Medical Record Number: [id number] Patient Account Number: [id number] Date of Birth/Sex: August 28, 1940 (78 y.o. M) Treating RN: Montey Hora Primary Care Darnesha Diloreto: Lavone Orn Other Clinician: Referring Lynzee Lindquist: Lavone Orn Treating Ladamien Rammel/Extender: Melburn Hake, HOYT Weeks in Treatment: 11 Active Inactive Abuse / Safety / Falls / Self Care Management Nursing Diagnoses: Potential  for falls Goals: Patient will not experience any injury related to falls Date Initiated: 08/06/2018 Target Resolution Date: 10/19/2018 Goal Status: Active Interventions: Assess fall risk on admission and as needed Notes: Orientation to the Wound Care Program Nursing Diagnoses: Knowledge deficit related to the wound healing center program Goals: Patient/caregiver will verbalize understanding of the [facility] Date Initiated: 08/06/2018 Target Resolution Date: 10/19/2018 Goal Status: Active Interventions: Provide education on orientation to the wound center Notes: Peripheral Neuropathy Nursing Diagnoses: Knowledge deficit related to disease process and management of peripheral neurovascular dysfunction Goals: Patient/caregiver will verbalize understanding of disease process and disease management Date Initiated: 08/06/2018 Target Resolution Date: 10/19/2018 Goal Status: Active Interventions: Provide education on Management of Neuropathy and Related Ulcers [name], [name] ([id number]) Notes: Wound/Skin Impairment Nursing Diagnoses: Impaired tissue integrity Goals: Ulcer/skin breakdown will heal within 14 weeks Date Initiated: 08/06/2018 Target Resolution Date: 10/19/2018 Goal Status: Active Interventions: Assess patient/caregiver ability to obtain necessary supplies Assess patient/caregiver ability to perform ulcer/skin care regimen upon admission and as needed Assess ulceration(s) every visit Notes: Electronic Signature(s) Signed: 10/22/2018 5:32:28 PM By: Montey Hora Entered By: Montey Hora on 10/22/2018 15:26:53 [name] ([id number]) -------------------------------------------------------------------------------- Pain Assessment Details [name]. Date of Service: 10/22/2018 2:30 PM Medical Record Number: [id number] Patient Account Number: [id number] Date of Birth/Sex: 08-23-1940 (78 y.o. M) Treating RN: Montey Hora Primary Care Esha Fincher: Lavone Orn Other Clinician: Referring Smith Mcnicholas: Lavone Orn Treating Naveen Clardy/Extender: Melburn Hake, HOYT Weeks in Treatment: 11 Active Problems Location of Pain Severity and Description of Pain Patient Has Paino Yes Site Locations Rate the pain. Current Pain Level: 5 Pain Management and Medication Current Pain Management: Electronic Signature(s) Signed: 10/22/2018 4:59:02 PM By: Lorine Bears RCP, RRT, CHT Signed: 10/22/2018 5:32:28 PM By: Montey Hora Entered By: Lorine Bears on 10/22/2018 14:35:17 [name], [name] ([id number]) -------------------------------------------------------------------------------- Patient/Caregiver Education Details [name] Date of Service: 10/22/2018 2:30 PM Medical Record Number: [id number] Patient Account Number: [id number] Date of Birth/Gender: 1940-07-26 (78 y.o. M) Treating RN: Montey Hora Primary Care Physician: Lavone Orn Other Clinician: Referring Physician: Lavone Orn Treating Physician/Extender: Sharalyn Ink in Treatment: 11 Education Assessment Education Provided To: Patient and Caregiver Education Topics Provided Offloading: Handouts: Other: need for TCC Methods: Explain/Verbal Responses: State content correctly Electronic Signature(s) Signed: 10/22/2018 5:32:28 PM By: Montey Hora Entered By: Montey Hora on 10/22/2018 15:34:10 [name], [name] ([id number]) -------------------------------------------------------------------------------- Wound Assessment Details [name]. Date of Service: 10/22/2018 2:30 PM Medical Record  Number: [id number] Patient Account Number: [id number] Date of Birth/Sex: 04/20/1940 (78 y.o. M) Treating RN: Harold Barban Primary Care Reianna Batdorf: Lavone Orn Other Clinician: Referring Hudsen Fei: Lavone Orn Treating Javius Sylla/Extender: Melburn Hake, HOYT Weeks in Treatment:  11 Wound Status Wound Number: 2 Primary 2nd degree Burn Etiology: Wound Location: Left Foot - Plantar, Proximal Secondary Diabetic Wound/Ulcer of the Lower Extremity Wounding Event: Thermal Burn Etiology: Date Acquired: 07/29/2018 Wound Status: Open Weeks Of Treatment: 11 Comorbid Cataracts, Glaucoma, Hypertension, Type II Clustered Wound: No History: Diabetes, Osteoarthritis, Neuropathy Photos Wound Measurements Length: (cm) 0.3 Width: (cm) 0.3 Depth: (cm) 0.4 Area: (cm) 0.071 Volume: (cm) 0.028 % Reduction in Area: 99.5% % Reduction in Volume: 98% Epithelialization: None Tunneling: No Undermining: No Wound Description Full Thickness Without Exposed Support Classification: Structures Wound Margin: Distinct, outline attached Exudate Medium Amount: Exudate Type: Purulent Exudate Color: yellow, brown, green Foul Odor After Cleansing: No Slough/Fibrino Yes Wound Bed Granulation Amount: Large (67-100%) Exposed Structure Granulation Quality: Pink Fascia Exposed: No Necrotic Amount: Small (1-33%) Fat Layer (Subcutaneous Tissue) Exposed: Yes Necrotic Quality: Eschar Tendon Exposed: No Muscle Exposed: No Joint Exposed: No Bone Exposed: No [name], [name] ([id number]) Treatment Notes Wound #2 (Left, Proximal, Plantar Foot) Notes prisma, foam and TCC Electronic Signature(s) Signed: 10/23/2018 4:37:37 PM By: Harold Barban Entered By: Harold Barban on 10/22/2018 14:53:19 [name], [name] ([id number]) -------------------------------------------------------------------------------- Vitals Details [name]. Date of Service: 10/22/2018 2:30 PM Medical Record Number: [id number] Patient Account Number: [id number] Date of Birth/Sex: March 08, 1940 (78 y.o. M) Treating RN: Montey Hora Primary Care Gowri Suchan: Lavone Orn Other Clinician: Referring Nolen Lindamood: Lavone Orn Treating Yussef Jorge/Extender: Melburn Hake, HOYT Weeks in Treatment: 11 Vital  Signs Time Taken: 14:35 Temperature (F): 98.9 Height (in): 69 Pulse (bpm): 96 Weight (lbs): 250 Respiratory Rate (breaths/min): 18 Body Mass Index (BMI): 36.9 Blood Pressure (mmHg): 120/80 Reference Range: 80 - 120 mg / dl Electronic Signature(s) Signed: 10/22/2018 4:59:02 PM By: Lorine Bears RCP, RRT, CHT Entered By: Lorine Bears on 10/22/2018 14:35:49
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
## 2018-10-28 ENCOUNTER — Other Ambulatory Visit: Payer: Self-pay

## 2018-10-28 ENCOUNTER — Encounter: Payer: Medicare Other | Admitting: Physician Assistant

## 2018-10-28 DIAGNOSIS — E11621 Type 2 diabetes mellitus with foot ulcer: Secondary | ICD-10-CM | POA: Diagnosis not present
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
## 2018-10-28 NOTE — Progress Notes (Signed)
Dennis, Kennedy ([id number]) Visit Report for 10/28/2018 Arrival Information Details [name], [name]. Date of Service: 10/28/2018 2:00 PM Medical Record Number: [id number] Patient Account Number: [id number] Date of Birth/Sex: 13-Oct-1940 (78 y.o. M) Treating RN: Army Melia Primary Care Ayomikun Starling: Lavone Orn Other Clinician: Referring Egbert Seidel: Lavone Orn Treating Reshma Hoey/Extender: Melburn Hake, HOYT Weeks in Treatment: 11 Visit Information History Since Last Visit Added or deleted any medications: No Patient Arrived: Walker Any new allergies or adverse reactions: No Arrival Time: 13:55 Had a fall or experienced change in No Accompanied By: wife activities of daily living that may affect Transfer Assistance: None risk of falls: Patient Identification Verified: Yes Signs or symptoms of abuse/neglect since last visito No Hospitalized since last visit: No Has Dressing in Place as Prescribed: Yes Pain Present Now: No Electronic Signature(s) Signed: 10/28/2018 4:42:10 PM By: Army Melia Entered By: Army Melia on 10/28/2018 13:56:09 Garrelts, [name] ([id number]) -------------------------------------------------------------------------------- Clinic Level of Care Assessment Details [name] Date of Service: 10/28/2018 2:00 PM Medical Record Number: [id number] Patient Account Number: [id number] Date of Birth/Sex: June 17, 1940 (78 y.o. M) Treating RN: Harold Barban Primary Care Sumiko Ceasar: Lavone Orn Other Clinician: Referring Tyra Michelle: Lavone Orn Treating Cambri Plourde/Extender: Melburn Hake, HOYT Weeks in Treatment: 11 Clinic Level of Care Assessment Items TOOL 4 Quantity Score []  - Use when only an EandM is performed on FOLLOW-UP visit 0 ASSESSMENTS - Nursing Assessment / Reassessment X - Reassessment of Co-morbidities (includes updates in patient status) 1 10 X- 1 5 Reassessment of Adherence to Treatment Plan ASSESSMENTS - Wound and Skin  Assessment / Reassessment X - Simple Wound Assessment / Reassessment - one wound 1 5 []  - 0 Complex Wound Assessment / Reassessment - multiple wounds []  - 0 Dermatologic / Skin Assessment (not related to wound area) ASSESSMENTS - Focused Assessment []  - Circumferential Edema Measurements - multi extremities 0 []  - 0 Nutritional Assessment / Counseling / Intervention []  - 0 Lower Extremity Assessment (monofilament, tuning fork, pulses) []  - 0 Peripheral Arterial Disease Assessment (using hand held doppler) ASSESSMENTS - Ostomy and/or Continence Assessment and Care []  - Incontinence Assessment and Management 0 []  - 0 Ostomy Care Assessment and Management (repouching, etc.) PROCESS - Coordination of Care X - Simple Patient / Family Education for ongoing care 1 15 []  - 0 Complex (extensive) Patient / Family Education for ongoing care []  - 0 Staff obtains Programmer, systems, Records, Test Results / Process Orders []  - 0 Staff telephones HHA, Nursing Homes / Clarify orders / etc []  - 0 Routine Transfer to another Facility (non-emergent condition) []  - 0 Routine Hospital Admission (non-emergent condition) []  - 0 New Admissions / Biomedical engineer / Ordering NPWT, Apligraf, etc. []  - 0 Emergency Hospital Admission (emergent condition) X- 1 10 Simple Discharge Coordination ADELINE, HUEBSCH ([id number]) []  - 0 Complex (extensive) Discharge Coordination PROCESS - Special Needs []  - Pediatric / Minor Patient Management 0 []  - 0 Isolation Patient Management []  - 0 Hearing / Language / Visual special needs []  - 0 Assessment of Community assistance (transportation, D/C planning, etc.) []  - 0 Additional assistance / Altered mentation []  - 0 Support Surface(s) Assessment (bed, cushion, seat, etc.) INTERVENTIONS - Wound Cleansing / Measurement X - Simple Wound Cleansing - one wound 1 5 []  - 0 Complex Wound Cleansing - multiple wounds X- 1 5 Wound Imaging (photographs - any number of  wounds) []  - 0 Wound Tracing (instead of photographs) X- 1 5 Simple Wound Measurement - one wound []  -  0 Complex Wound Measurement - multiple wounds INTERVENTIONS - Wound Dressings X - Small Wound Dressing one or multiple wounds 1 10 []  - 0 Medium Wound Dressing one or multiple wounds []  - 0 Large Wound Dressing one or multiple wounds []  - 0 Application of Medications - topical []  - 0 Application of Medications - injection INTERVENTIONS - Miscellaneous []  - External ear exam 0 []  - 0 Specimen Collection (cultures, biopsies, blood, body fluids, etc.) []  - 0 Specimen(s) / Culture(s) sent or taken to Lab for analysis []  - 0 Patient Transfer (multiple staff / Civil Service fast streamer / Similar devices) []  - 0 Simple Staple / Suture removal (25 or less) []  - 0 Complex Staple / Suture removal (26 or more) []  - 0 Hypo / Hyperglycemic Management (close monitor of Blood Glucose) []  - 0 Ankle / Brachial Index (ABI) - do not check if billed separately X- 1 5 Vital Signs Rys, CANNON JENNETTE ([id number]) Has the patient been seen at the hospital within the last three years: Yes Total Score: 75 Level Of Care: New/Established - Level 2 Electronic Signature(s) Signed: 10/28/2018 4:50:44 PM By: Harold Barban Entered By: Harold Barban on 10/28/2018 15:09:06 [name] ([id number]) -------------------------------------------------------------------------------- Encounter Discharge Information Details [name]. Date of Service: 10/28/2018 2:00 PM Medical Record Number: [id number] Patient Account Number: [id number] Date of Birth/Sex: 1940-12-08 (78 y.o. M) Treating RN: Harold Barban Primary Care Bryne Lindon: Lavone Orn Other Clinician: Referring Kahiau Schewe: Lavone Orn Treating Caffie Sotto/Extender: Melburn Hake, HOYT Weeks in Treatment: 11 Encounter Discharge Information Items Discharge Condition: Stable Ambulatory Status: Cane Discharge Destination: Home Transportation:  Private Auto Accompanied By: wife Schedule Follow-up Appointment: Yes Clinical Summary of Care: Electronic Signature(s) Signed: 10/28/2018 4:50:44 PM By: Harold Barban Entered By: Harold Barban on 10/28/2018 15:17:41 Brosch, [name] ([id number]) -------------------------------------------------------------------------------- Lower Extremity Assessment Details [name]. Date of Service: 10/28/2018 2:00 PM Medical Record Number: [id number] Patient Account Number: [id number] Date of Birth/Sex: 20-Jan-1940 (78 y.o. M) Treating RN: Army Melia Primary Care Jeanetta Alonzo: Lavone Orn Other Clinician: Referring Quanta Robertshaw: Lavone Orn Treating Greer Koeppen/Extender: STONE III, HOYT Weeks in Treatment: 11 Edema Assessment Assessed: [Left: No] [Right: No] Edema: [Left: N] [Right: o] Vascular Assessment Pulses: Dorsalis Pedis Palpable: [Left:Yes] Electronic Signature(s) Signed: 10/28/2018 4:42:10 PM By: Army Melia Entered By: Army Melia on 10/28/2018 14:09:20 Maynes, [name] ([id number]) -------------------------------------------------------------------------------- Multi Wound Chart Details [name]. Date of Service: 10/28/2018 2:00 PM Medical Record Number: [id number] Patient Account Number: [id number] Date of Birth/Sex: [birth year] (78 y.o. M) Treating RN: Harold Barban Primary Care Donnarae Rae: Lavone Orn Other Clinician: Referring Benett Swoyer: Lavone Orn Treating Cassandre Oleksy/Extender: Melburn Hake, HOYT Weeks in Treatment: 11 Vital Signs Height(in): 69 Pulse(bpm): 94 Weight(lbs): 250 Blood Pressure(mmHg): 155/85 Body Mass Index(BMI): 37 Temperature(F): 98.1 Respiratory Rate 16 (breaths/min): Photos: [N/A:N/A] Wound Location: Left Foot - Plantar, Proximal N/A N/A Wounding Event: Thermal Burn N/A N/A Primary Etiology: 2nd degree Burn N/A N/A Secondary Etiology: Diabetic Wound/Ulcer of the N/A N/A Lower Extremity Comorbid History:  Cataracts, Glaucoma, N/A N/A Hypertension, Type II Diabetes, Osteoarthritis, Neuropathy Date Acquired: 07/29/2018 N/A N/A Weeks of Treatment: 11 N/A N/A Wound Status: Open N/A N/A Measurements L x W x D 0.3x0.3x0.3 N/A N/A (cm) Area (cm) : 0.071 N/A N/A Volume (cm) : 0.021 N/A N/A % Reduction in Area: 99.50% N/A N/A % Reduction in Volume: 98.50% N/A N/A Classification: Full Thickness Without N/A N/A Exposed Support Structures Exudate Amount: Medium N/A N/A Exudate Type: Purulent N/A N/A Exudate Color: yellow,  brown, green N/A N/A Wound Margin: Distinct, outline attached N/A N/A Granulation Amount: Small (1-33%) N/A N/A Granulation Quality: Pink N/A N/A Necrotic Amount: Large (67-100%) N/A N/A Exposed Structures: Fat Layer (Subcutaneous N/A N/A Tissue) Exposed: Yes RIVALDO, BECKNELL ([id number]) Fascia: No Tendon: No Muscle: No Joint: No Bone: No Epithelialization: None N/A N/A Treatment Notes Electronic Signature(s) Signed: 10/28/2018 4:50:44 PM By: Harold Barban Entered By: Harold Barban on 10/28/2018 15:04:29 [name] ([id number]) -------------------------------------------------------------------------------- Multi-Disciplinary Care Plan Details [name]. Date of Service: 10/28/2018 2:00 PM Medical Record Number: [id number] Patient Account Number: [id number] Date of Birth/Sex: Jun 07, 1940 (78 y.o. M) Treating RN: Harold Barban Primary Care Antoinne Spadaccini: Lavone Orn Other Clinician: Referring Ercel Pepitone: Lavone Orn Treating Airis Barbee/Extender: Melburn Hake, HOYT Weeks in Treatment: 11 Active Inactive Abuse / Safety / Falls / Self Care Management Nursing Diagnoses: Potential for falls Goals: Patient will not experience any injury related to falls Date Initiated: 08/06/2018 Target Resolution Date: 10/19/2018 Goal Status: Active Interventions: Assess fall risk on admission and as needed Notes: Orientation to the Wound Care  Program Nursing Diagnoses: Knowledge deficit related to the wound healing center program Goals: Patient/caregiver will verbalize understanding of the [facility] Program Date Initiated: 08/06/2018 Target Resolution Date: 10/19/2018 Goal Status: Active Interventions: Provide education on orientation to the wound center Notes: Peripheral Neuropathy Nursing Diagnoses: Knowledge deficit related to disease process and management of peripheral neurovascular dysfunction Goals: Patient/caregiver will verbalize understanding of disease process and disease management Date Initiated: 08/06/2018 Target Resolution Date: 10/19/2018 Goal Status: Active Interventions: Provide education on Management of Neuropathy and Related Ulcers TALIEK, KUPFER ([id number]) Notes: Wound/Skin Impairment Nursing Diagnoses: Impaired tissue integrity Goals: Ulcer/skin breakdown will heal within 14 weeks Date Initiated: 08/06/2018 Target Resolution Date: 10/19/2018 Goal Status: Active Interventions: Assess patient/caregiver ability to obtain necessary supplies Assess patient/caregiver ability to perform ulcer/skin care regimen upon admission and as needed Assess ulceration(s) every visit Notes: Electronic Signature(s) Signed: 10/28/2018 4:50:44 PM By: Harold Barban Entered By: Harold Barban on 10/28/2018 15:03:29 [name] ([id number]) -------------------------------------------------------------------------------- Pain Assessment Details [name]. Date of Service: 10/28/2018 2:00 PM Medical Record Number: [id number] Patient Account Number: [id number] Date of Birth/Sex: Jul 14, 1940 (78 y.o. M) Treating RN: Army Melia Primary Care Casper Pagliuca: Lavone Orn Other Clinician: Referring Kyli Sorter: Lavone Orn Treating Alaisha Eversley/Extender: Melburn Hake, HOYT Weeks in Treatment: 11 Active Problems Location of Pain Severity and Description of Pain Patient Has Paino No Site  Locations Pain Management and Medication Current Pain Management: Electronic Signature(s) Signed: 10/28/2018 4:42:10 PM By: Army Melia Entered By: Army Melia on 10/28/2018 13:56:20 Tine, [name] ([id number]) -------------------------------------------------------------------------------- Patient/Caregiver Education Details [name] Date of Service: 10/28/2018 2:00 PM Medical Record Number: [id number] Patient Account Number: [id number] Date of Birth/Gender: November 22, 1940 (78 y.o. M) Treating RN: Harold Barban Primary Care Physician: Lavone Orn Other Clinician: Referring Physician: Lavone Orn Treating Physician/Extender: Sharalyn Ink in Treatment: 11 Education Assessment Education Provided To: Patient Education Topics Provided Wound/Skin Impairment: Handouts: Caring for Your Ulcer Methods: Demonstration, Explain/Verbal Responses: State content correctly Electronic Signature(s) Signed: 10/28/2018 4:50:44 PM By: Harold Barban Entered By: Harold Barban on 10/28/2018 15:05:10 [name] ([id number]) -------------------------------------------------------------------------------- Wound Assessment Details [name]. Date of Service: 10/28/2018 2:00 PM Medical Record Number: [id number] Patient Account Number: [id number] Date of Birth/Sex: 1940/09/08 (78 y.o. M) Treating RN: Army Melia Primary Care Greidy Sherard: Lavone Orn Other Clinician: Referring Lucille Witts: Lavone Orn Treating Cosby Proby/Extender: Melburn Hake, HOYT Weeks in Treatment: 11  Wound Status Wound Number: 2 Primary 2nd degree Burn Etiology: Wound Location: Left Foot - Plantar, Proximal Secondary Diabetic Wound/Ulcer of the Lower Extremity Wounding Event: Thermal Burn Etiology: Date Acquired: 07/29/2018 Wound Status: Open Weeks Of Treatment: 11 Comorbid Cataracts, Glaucoma, Hypertension, Type II Clustered Wound: No History: Diabetes, Osteoarthritis,  Neuropathy Photos Wound Measurements Length: (cm) 0.3 Width: (cm) 0.3 Depth: (cm) 0.3 Area: (cm) 0.071 Volume: (cm) 0.021 % Reduction in Area: 99.5% % Reduction in Volume: 98.5% Epithelialization: None Tunneling: No Undermining: No Wound Description Full Thickness Without Exposed Support Classification: Structures Wound Margin: Distinct, outline attached Exudate Medium Amount: Exudate Type: Purulent Exudate Color: yellow, brown, green Foul Odor After Cleansing: No Slough/Fibrino Yes Wound Bed Granulation Amount: Small (1-33%) Exposed Structure Granulation Quality: Pink Fascia Exposed: No Necrotic Amount: Large (67-100%) Fat Layer (Subcutaneous Tissue) Exposed: Yes Necrotic Quality: Adherent Slough Tendon Exposed: No Muscle Exposed: No Joint Exposed: No Bone Exposed: No BEXLEY, MOSEY ([id number]) Treatment Notes Wound #2 (Left, Proximal, Plantar Foot) Notes prisma, Foam, ABD, conform Peg assist shoe Electronic Signature(s) Signed: 10/28/2018 4:42:10 PM By: Army Melia Entered By: Army Melia on 10/28/2018 14:08:46 Lepak, [name] ([id number]) -------------------------------------------------------------------------------- Vitals Details [name]. Date of Service: 10/28/2018 2:00 PM Medical Record Number: [id number] Patient Account Number: [id number] Date of Birth/Sex: 1940/06/30 (78 y.o. M) Treating RN: Army Melia Primary Care Cassadee Vanzandt: Lavone Orn Other Clinician: Referring Latonyia Lopata: Lavone Orn Treating Kristal Perl/Extender: Melburn Hake, HOYT Weeks in Treatment: 11 Vital Signs Time Taken: 13:56 Temperature (F): 98.1 Height (in): 69 Pulse (bpm): 94 Weight (lbs): 250 Respiratory Rate (breaths/min): 16 Body Mass Index (BMI): 36.9 Blood Pressure (mmHg): 155/85 Reference Range: 80 - 120 mg / dl Electronic Signature(s) Signed: 10/28/2018 4:42:10 PM By: Army Melia Entered By: Army Melia on 10/28/2018 13:56:38

## 2018-11-01 ENCOUNTER — Telehealth (HOSPITAL_COMMUNITY): Payer: Self-pay

## 2018-11-01 NOTE — Telephone Encounter (Signed)

## 2018-11-04 ENCOUNTER — Ambulatory Visit (HOSPITAL_COMMUNITY): Admission: RE | Admit: 2018-11-04 | Payer: Medicare Other | Source: Ambulatory Visit

## 2018-11-04 ENCOUNTER — Other Ambulatory Visit: Payer: Self-pay

## 2018-11-04 ENCOUNTER — Ambulatory Visit (HOSPITAL_COMMUNITY)
Admission: RE | Admit: 2018-11-04 | Discharge: 2018-11-04 | Disposition: A | Discharge status: Home / Self Care | Payer: Medicare Other | Source: Ambulatory Visit | Attending: Family | Admitting: Family

## 2018-11-04 ENCOUNTER — Other Ambulatory Visit (HOSPITAL_COMMUNITY): Payer: Self-pay | Admitting: Physician Assistant

## 2018-11-04 DIAGNOSIS — T25222S Burn of second degree of left foot, sequela: Secondary | ICD-10-CM | POA: Diagnosis not present

## 2018-11-04 DIAGNOSIS — I890 Lymphedema, not elsewhere classified: Secondary | ICD-10-CM

## 2018-11-04 DIAGNOSIS — T25222A Burn of second degree of left foot, initial encounter: Secondary | ICD-10-CM | POA: Diagnosis not present

## 2018-11-04 DIAGNOSIS — E119 Type 2 diabetes mellitus without complications: Secondary | ICD-10-CM

## 2018-11-04 DIAGNOSIS — G629 Polyneuropathy, unspecified: Secondary | ICD-10-CM | POA: Diagnosis not present

## 2018-11-07 ENCOUNTER — Ambulatory Visit: Payer: Medicare Other | Admitting: Internal Medicine

## 2018-11-12 ENCOUNTER — Ambulatory Visit: Payer: Medicare Other | Admitting: Physician Assistant

## 2018-11-19 ENCOUNTER — Other Ambulatory Visit: Payer: Self-pay

## 2018-11-19 ENCOUNTER — Encounter: Payer: Medicare Other | Attending: Physician Assistant | Admitting: Physician Assistant

## 2018-11-19 DIAGNOSIS — X19XXXD Contact with other heat and hot substances, subsequent encounter: Secondary | ICD-10-CM | POA: Insufficient documentation

## 2018-11-19 DIAGNOSIS — T23202D Burn of second degree of left hand, unspecified site, subsequent encounter: Secondary | ICD-10-CM | POA: Diagnosis not present

## 2018-11-19 DIAGNOSIS — E1142 Type 2 diabetes mellitus with diabetic polyneuropathy: Secondary | ICD-10-CM | POA: Diagnosis not present

## 2018-11-19 DIAGNOSIS — L97812 Non-pressure chronic ulcer of other part of right lower leg with fat layer exposed: Secondary | ICD-10-CM | POA: Insufficient documentation

## 2018-11-19 DIAGNOSIS — L97909 Non-pressure chronic ulcer of unspecified part of unspecified lower leg with unspecified severity: Secondary | ICD-10-CM | POA: Diagnosis present

## 2018-11-19 DIAGNOSIS — T25221D Burn of second degree of right foot, subsequent encounter: Secondary | ICD-10-CM | POA: Insufficient documentation

## 2018-11-19 DIAGNOSIS — T25222D Burn of second degree of left foot, subsequent encounter: Secondary | ICD-10-CM | POA: Diagnosis not present

## 2018-11-19 DIAGNOSIS — L97429 Non-pressure chronic ulcer of left heel and midfoot with unspecified severity: Secondary | ICD-10-CM | POA: Diagnosis not present

## 2018-11-19 DIAGNOSIS — I890 Lymphedema, not elsewhere classified: Secondary | ICD-10-CM | POA: Diagnosis not present

## 2018-11-19 DIAGNOSIS — E11621 Type 2 diabetes mellitus with foot ulcer: Secondary | ICD-10-CM | POA: Diagnosis not present

## 2018-11-19 DIAGNOSIS — L97409 Non-pressure chronic ulcer of unspecified heel and midfoot with unspecified severity: Secondary | ICD-10-CM | POA: Diagnosis not present
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
## 2018-11-19 NOTE — Progress Notes (Addendum)
[name] ([id number]) Visit Report for 11/19/2018 Arrival Information Details [name]. Date of Service: 11/19/2018 3:00 PM Medical Record Number: [id number] Patient Account Number: [id number] Date of Birth/Sex: 23-May-1940 (78 y.o. M) Treating RN: Montey Hora Primary Care Pharrell Ledford: Lavone Orn Other Clinician: Referring Nasri Boakye: Lavone Orn Treating Starlet Gallentine/Extender: Melburn Hake, HOYT Weeks in Treatment: 15 Visit Information History Since Last Visit Added or deleted any medications: No Patient Arrived: Cane Any new allergies or adverse reactions: No Arrival Time: 15:20 Had a fall or experienced change in No Accompanied By: wife activities of daily living that may affect Transfer Assistance: None risk of falls: Patient Identification Verified: Yes Signs or symptoms of abuse/neglect since last visito No Secondary Verification Process Completed: Yes Hospitalized since last visit: No Implantable device outside of the clinic excluding No cellular tissue based products placed in the center since last visit: Has Dressing in Place as Prescribed: Yes Pain Present Now: No Electronic Signature(s) Signed: 11/19/2018 4:25:36 PM By: Lorine Bears RCP, RRT, CHT Entered By: Lorine Bears on 11/19/2018 15:22:37 [name] ([id number]) -------------------------------------------------------------------------------- Clinic Level of Care Assessment Details [name]. Date of Service: 11/19/2018 3:00 PM Medical Record Number: [id number] Patient Account Number: [id number] Date of Birth/Sex: March 14, 1940 (78 y.o. M) Treating RN: Montey Hora Primary Care Janelys Glassner: Lavone Orn Other Clinician: Referring Andrick Rust: Lavone Orn Treating Wynelle Dreier/Extender: Melburn Hake, HOYT Weeks in Treatment: 15 Clinic Level of Care Assessment Items TOOL 4 Quantity Score []  - Use when only an EandM is performed on FOLLOW-UP  visit 0 ASSESSMENTS - Nursing Assessment / Reassessment X - Reassessment of Co-morbidities (includes updates in patient status) 1 10 X- 1 5 Reassessment of Adherence to Treatment Plan ASSESSMENTS - Wound and Skin Assessment / Reassessment X - Simple Wound Assessment / Reassessment - one wound 1 5 []  - 0 Complex Wound Assessment / Reassessment - multiple wounds []  - 0 Dermatologic / Skin Assessment (not related to wound area) ASSESSMENTS - Focused Assessment []  - Circumferential Edema Measurements - multi extremities 0 []  - 0 Nutritional Assessment / Counseling / Intervention X- 1 5 Lower Extremity Assessment (monofilament, tuning fork, pulses) []  - 0 Peripheral Arterial Disease Assessment (using hand held doppler) ASSESSMENTS - Ostomy and/or Continence Assessment and Care []  - Incontinence Assessment and Management 0 []  - 0 Ostomy Care Assessment and Management (repouching, etc.) PROCESS - Coordination of Care X - Simple Patient / Family Education for ongoing care 1 15 []  - 0 Complex (extensive) Patient / Family Education for ongoing care X- 1 10 Staff obtains Programmer, systems, Records, Test Results / Process Orders []  - 0 Staff telephones HHA, Nursing Homes / Clarify orders / etc []  - 0 Routine Transfer to another Facility (non-emergent condition) []  - 0 Routine Hospital Admission (non-emergent condition) []  - 0 New Admissions / Biomedical engineer / Ordering NPWT, Apligraf, etc. []  - 0 Emergency Hospital Admission (emergent condition) X- 1 10 Simple Discharge Coordination [name] ([id number]) []  - 0 Complex (extensive) Discharge Coordination PROCESS - Special Needs []  - Pediatric / Minor Patient Management 0 []  - 0 Isolation Patient Management []  - 0 Hearing / Language / Visual special needs []  - 0 Assessment of Community assistance (transportation, D/C planning, etc.) []  - 0 Additional assistance / Altered mentation []  - 0 Support Surface(s) Assessment  (bed, cushion, seat, etc.) INTERVENTIONS - Wound Cleansing / Measurement X - Simple Wound Cleansing - one wound 1 5 []  - 0 Complex Wound Cleansing - multiple wounds  X- 1 5 Wound Imaging (photographs - any number of wounds) []  - 0 Wound Tracing (instead of photographs) X- 1 5 Simple Wound Measurement - one wound []  - 0 Complex Wound Measurement - multiple wounds INTERVENTIONS - Wound Dressings X - Small Wound Dressing one or multiple wounds 1 10 []  - 0 Medium Wound Dressing one or multiple wounds []  - 0 Large Wound Dressing one or multiple wounds []  - 0 Application of Medications - topical []  - 0 Application of Medications - injection INTERVENTIONS - Miscellaneous []  - External ear exam 0 []  - 0 Specimen Collection (cultures, biopsies, blood, body fluids, etc.) []  - 0 Specimen(s) / Culture(s) sent or taken to Lab for analysis []  - 0 Patient Transfer (multiple staff / Civil Service fast streamer / Similar devices) []  - 0 Simple Staple / Suture removal (25 or less) []  - 0 Complex Staple / Suture removal (26 or more) []  - 0 Hypo / Hyperglycemic Management (close monitor of Blood Glucose) []  - 0 Ankle / Brachial Index (ABI) - do not check if billed separately X- 1 5 Vital Signs [name] ([id number]) Has the patient been seen at the hospital within the last three years: Yes Total Score: 90 Level Of Care: New/Established - Level 3 Electronic Signature(s) Signed: 11/19/2018 5:03:01 PM By: Montey Hora Entered By: Montey Hora on 11/19/2018 15:56:10 [name] ([id number]) -------------------------------------------------------------------------------- Complex / Palliative Patient Assessment Details [name]. Date of Service: 11/19/2018 3:00 PM Medical Record Number: [id number] Patient Account Number: [id number] Date of Birth/Sex: [birth year] (78 y.o. M) Treating RN: Montey Hora Primary Care Bentlie Withem: Lavone Orn Other Clinician: Referring  [name]: Lavone Orn Treating Cheyan Frees/Extender: Melburn Hake, HOYT Weeks in Treatment: 15 Palliative Management Criteria Complex Wound Management Criteria Patient has remarkable or complex co-morbidities requiring medications or treatments that extend wound healing times. Examples: o Diabetes mellitus with chronic renal failure or end stage renal disease requiring dialysis o Advanced or poorly controlled rheumatoid arthritis o Diabetes mellitus and end stage chronic obstructive pulmonary disease o Active cancer with current chemo- or radiation therapy DMII, neuropathy, lymphedema Care Approach Wound Care Plan: Complex Wound Management Electronic Signature(s) Signed: 11/19/2018 5:03:01 PM By: Montey Hora Signed: 11/19/2018 5:47:19 PM By: Worthy Keeler PA-C Entered By: Montey Hora on 11/19/2018 15:46:53 Bensman, Annetta Kennedy ([id number]) -------------------------------------------------------------------------------- Encounter Discharge Information Details [name]. Date of Service: 11/19/2018 3:00 PM Medical Record Number: [id number] Patient Account Number: [id number] Date of Birth/Sex: 25-Nov-1940 (78 y.o. M) Treating RN: Montey Hora Primary Care Ayane Delancey: Lavone Orn Other Clinician: Referring Jann Ra: Lavone Orn Treating Belmira Daley/Extender: Melburn Hake, HOYT Weeks in Treatment: 15 Encounter Discharge Information Items Discharge Condition: Stable Ambulatory Status: Cane Discharge Destination: Home Transportation: Private Auto Accompanied By: wife Schedule Follow-up Appointment: Yes Clinical Summary of Care: Electronic Signature(s) Signed: 11/19/2018 5:03:01 PM By: Montey Hora Entered By: Montey Hora on 11/19/2018 15:57:12 Delosreyes, Annetta Kennedy ([id number]) -------------------------------------------------------------------------------- Lower Extremity Assessment Details [name]. Date of Service: 11/19/2018 3:00  PM Medical Record Number: [id number] Patient Account Number: [id number] Date of Birth/Sex: [birth year] (78 y.o. M) Treating RN: Army Melia Primary Care Lucky Trotta: Lavone Orn Other Clinician: Referring Aubert Choyce: Lavone Orn Treating Mouhamad Teed/Extender: STONE III, HOYT Weeks in Treatment: 15 Edema Assessment Assessed: [Left: No] [Right: No] Edema: [Left: N] [Right: o] Vascular Assessment Pulses: Dorsalis Pedis Palpable: [Right:Yes] Electronic Signature(s) Signed: 11/21/2018 3:26:34 PM By: Army Melia Entered By: Army Melia on 11/19/2018 15:27:51 Cohron, Annetta Kennedy ([id number]) -------------------------------------------------------------------------------- Multi Wound Chart Details Patient  Name: KAEDIN, HORKEY. Date of Service: 11/19/2018 3:00 PM Medical Record Number: [id number] Patient Account Number: [id number] Date of Birth/Sex: August 21, 1940 (78 y.o. M) Treating RN: Montey Hora Primary Care Amybeth Sieg: Lavone Orn Other Clinician: Referring Azariya Freeman: Lavone Orn Treating Zaiden Ludlum/Extender: Melburn Hake, HOYT Weeks in Treatment: 15 Vital Signs Height(in): 69 Pulse(bpm): 101 Weight(lbs): 250 Blood Pressure(mmHg): 148/77 Body Mass Index(BMI): 37 Temperature(F): 98.7 Respiratory Rate 16 (breaths/min): Photos: [N/A:N/A] Wound Location: Left Foot - Plantar, Proximal N/A N/A Wounding Event: Thermal Burn N/A N/A Primary Etiology: 2nd degree Burn N/A N/A Secondary Etiology: Diabetic Wound/Ulcer of the N/A N/A Lower Extremity Comorbid History: Cataracts, Glaucoma, N/A N/A Hypertension, Type II Diabetes, Osteoarthritis, Neuropathy Date Acquired: 07/29/2018 N/A N/A Weeks of Treatment: 15 N/A N/A Wound Status: Open N/A N/A Measurements L x W x D 0.5x0.5x0.4 N/A N/A (cm) Area (cm) : 0.196 N/A N/A Volume (cm) : 0.079 N/A N/A % Reduction in Area: 98.60% N/A N/A % Reduction in Volume: 94.30% N/A N/A Classification: Full Thickness Without N/A N/A Exposed Support  Structures Exudate Amount: Medium N/A N/A Exudate Type: Purulent N/A N/A Exudate Color: yellow, brown, green N/A N/A Wound Margin: Distinct, outline attached N/A N/A Granulation Amount: Medium (34-66%) N/A N/A Granulation Quality: Pink N/A N/A Necrotic Amount: Medium (34-66%) N/A N/A Exposed Structures: Fat Layer (Subcutaneous N/A N/A Tissue) Exposed: Yes AYAD, FALLONE ([id number]) Fascia: No Tendon: No Muscle: No Joint: No Bone: No Epithelialization: None N/A N/A Treatment Notes Electronic Signature(s) Signed: 11/19/2018 5:03:01 PM By: Montey Hora Entered By: Montey Hora on 11/19/2018 15:47:11 Sestak, Annetta Kennedy ([id number]) -------------------------------------------------------------------------------- Multi-Disciplinary Care Plan Details [name]. Date of Service: 11/19/2018 3:00 PM Medical Record Number: [id number] Patient Account Number: [id number] Date of Birth/Sex: [birth year] (78 y.o. M) Treating RN: Montey Hora Primary Care Malyia Moro: Lavone Orn Other Clinician: Referring Vahan Wadsworth: Lavone Orn Treating Sada Mazzoni/Extender: Melburn Hake, HOYT Weeks in Treatment: 15 Active Inactive Abuse / Safety / Falls / Self Care Management Nursing Diagnoses: Potential for falls Goals: Patient will not experience any injury related to falls Date Initiated: 08/06/2018 Target Resolution Date: 10/19/2018 Goal Status: Active Interventions: Assess fall risk on admission and as needed Notes: Orientation to the Wound Care Program Nursing Diagnoses: Knowledge deficit related to the wound healing center program Goals: Patient/caregiver will verbalize understanding of the [place] Program Date Initiated: 08/06/2018 Target Resolution Date: 10/19/2018 Goal Status: Active Interventions: Provide education on orientation to the wound center Notes: Peripheral Neuropathy Nursing Diagnoses: Knowledge deficit related to disease process and management  of peripheral neurovascular dysfunction Goals: Patient/caregiver will verbalize understanding of disease process and disease management Date Initiated: 08/06/2018 Target Resolution Date: 10/19/2018 Goal Status: Active Interventions: Provide education on Management of Neuropathy and Related Ulcers AAYAAN, AL ([id number]) Notes: Wound/Skin Impairment Nursing Diagnoses: Impaired tissue integrity Goals: Ulcer/skin breakdown will heal within 14 weeks Date Initiated: 08/06/2018 Target Resolution Date: 10/19/2018 Goal Status: Active Interventions: Assess patient/caregiver ability to obtain necessary supplies Assess patient/caregiver ability to perform ulcer/skin care regimen upon admission and as needed Assess ulceration(s) every visit Notes: Electronic Signature(s) Signed: 11/19/2018 5:03:01 PM By: Montey Hora Entered By: Montey Hora on 11/19/2018 15:47:04 [place], Annetta Kennedy ([id number]) -------------------------------------------------------------------------------- Pain Assessment Details [name]. Date of Service: 11/19/2018 3:00 PM Medical Record Number: [id number] Patient Account Number: [id number] Date of Birth/Sex: [birth year] (78 y.o. M) Treating RN: Montey Hora Primary Care Renesmae Donahey: Lavone Orn Other Clinician: Referring Chiron Campione: Lavone Orn Treating Theia Dezeeuw/Extender: Melburn Hake, HOYT Weeks in Treatment: 15 Active Problems Location of  Pain Severity and Description of Pain Patient Has Paino No Site Locations Pain Management and Medication Current Pain Management: Electronic Signature(s) Signed: 11/19/2018 4:25:36 PM By: Paulla Fore, RRT, CHT Signed: 11/19/2018 5:03:01 PM By: Montey Hora Entered By: Lorine Bears on 11/19/2018 15:22:48 OWEN, CAUGHLIN ([id number]) -------------------------------------------------------------------------------- Patient/Caregiver Education Details Patient Name:  [name] Date of Service: 11/19/2018 3:00 PM Medical Record Number: [id number] Patient Account Number: [id number] Date of Birth/Gender: October 09, 1940 (78 y.o. M) Treating RN: Montey Hora Primary Care Physician: Lavone Orn Other Clinician: Referring Physician: Lavone Orn Treating Physician/Extender: Sharalyn Ink in Treatment: 15 Education Assessment Education Provided To: Patient and Caregiver Education Topics Provided Wound/Skin Impairment: Handouts: Other: wound care as ordered Methods: Demonstration, Explain/Verbal Responses: State content correctly Electronic Signature(s) Signed: 11/19/2018 5:03:01 PM By: Montey Hora Entered By: Montey Hora on 11/19/2018 15:56:29 Cumbo, Annetta Kennedy ([id number]) -------------------------------------------------------------------------------- Wound Assessment Details [name]. Date of Service: 11/19/2018 3:00 PM Medical Record Number: [id number] Patient Account Number: [id number] Date of Birth/Sex: 08-23-1940 (78 y.o. M) Treating RN: Army Melia Primary Care Zyra Parrillo: Lavone Orn Other Clinician: Referring Janaria Mccammon: Lavone Orn Treating Adessa Primiano/Extender: Melburn Hake, HOYT Weeks in Treatment: 15 Wound Status Wound Number: 2 Primary 2nd degree Burn Etiology: Wound Location: Left Foot - Plantar, Proximal Secondary Diabetic Wound/Ulcer of the Lower Extremity Wounding Event: Thermal Burn Etiology: Date Acquired: 07/29/2018 Wound Status: Open Weeks Of Treatment: 15 Comorbid Cataracts, Glaucoma, Hypertension, Type II Clustered Wound: No History: Diabetes, Osteoarthritis, Neuropathy Photos Wound Measurements Length: (cm) 0.5 Width: (cm) 0.5 Depth: (cm) 0.4 Area: (cm) 0.196 Volume: (cm) 0.079 % Reduction in Area: 98.6% % Reduction in Volume: 94.3% Epithelialization: None Tunneling: No Undermining: No Wound Description Full Thickness Without Exposed  Support Classification: Structures Wound Margin: Distinct, outline attached Exudate Medium Amount: Exudate Type: Purulent Exudate Color: yellow, brown, green Foul Odor After Cleansing: No Slough/Fibrino Yes Wound Bed Granulation Amount: Medium (34-66%) Exposed Structure Granulation Quality: Pink Fascia Exposed: No Necrotic Amount: Medium (34-66%) Fat Layer (Subcutaneous Tissue) Exposed: Yes Necrotic Quality: Adherent Slough Tendon Exposed: No Muscle Exposed: No Joint Exposed: No Bone Exposed: No Ghuman, REEVE EWELL ([id number]) Treatment Notes Wound #2 (Left, Proximal, Plantar Foot) Notes silvercel, Foam, ABD, conform Peg assist shoe Electronic Signature(s) Signed: 11/21/2018 3:26:34 PM By: Army Melia Entered By: Army Melia on 11/19/2018 15:25:55 Reposa, Annetta Kennedy ([id number]) -------------------------------------------------------------------------------- Vitals Details [name]. Date of Service: 11/19/2018 3:00 PM Medical Record Number: [id number] Patient Account Number: [id number] Date of Birth/Sex: 10/27/1940 (78 y.o. M) Treating RN: Montey Hora Primary Care Kashawn Dirr: Lavone Orn Other Clinician: Referring Hawthorne Day: Lavone Orn Treating Stacie Knutzen/Extender: Melburn Hake, HOYT Weeks in Treatment: 15 Vital Signs Time Taken: 15:20 Temperature (F): 98.7 Height (in): 69 Pulse (bpm): 101 Weight (lbs): 250 Respiratory Rate (breaths/min): 16 Body Mass Index (BMI): 36.9 Blood Pressure (mmHg): 148/77 Reference Range: 80 - 120 mg / dl Electronic Signature(s) Signed: 11/19/2018 4:25:36 PM By: Lorine Bears RCP, RRT, CHT Entered By: Lorine Bears on 11/19/2018 15:23:21

## 2018-11-25 ENCOUNTER — Encounter: Payer: Medicare Other | Admitting: Physician Assistant

## 2018-11-25 ENCOUNTER — Other Ambulatory Visit: Payer: Self-pay

## 2018-11-25 DIAGNOSIS — E11621 Type 2 diabetes mellitus with foot ulcer: Secondary | ICD-10-CM | POA: Diagnosis not present
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
## 2018-11-25 NOTE — Progress Notes (Signed)
[name], Dennis ([id number]) Visit Report for 11/25/2018 Arrival Information Details [name]. Date of Service: 11/25/2018 3:30 PM Medical Record Number: [id number] Patient Account Number: [id number] Date of Birth/Sex: 1940-12-17 (78 y.o. M) Treating RN: Harold Barban Primary Care Rc Amison: Lavone Orn Other Clinician: Referring Correna Meacham: Lavone Orn Treating Dalesha Stanback/Extender: Melburn Hake, HOYT Weeks in Treatment: 15 Visit Information History Since Last Visit Added or deleted any medications: No Patient Arrived: Cane Any new allergies or adverse reactions: No Arrival Time: 15:32 Had a fall or experienced change in No Accompanied By: wife activities of daily living that may affect Transfer Assistance: None risk of falls: Patient Identification Verified: Yes Signs or symptoms of abuse/neglect since last visito No Secondary Verification Process Completed: Yes Hospitalized since last visit: No Implantable device outside of the clinic excluding No cellular tissue based products placed in the center since last visit: Has Dressing in Place as Prescribed: Yes Pain Present Now: No Electronic Signature(s) Signed: 11/25/2018 4:13:56 PM By: Lorine Bears RCP, RRT, CHT Entered By: Lorine Bears on 11/25/2018 15:34:15 [name], [name] ([id number]) -------------------------------------------------------------------------------- Clinic Level of Care Assessment Details [name]. Date of Service: 11/25/2018 3:30 PM Medical Record Number: [id number] Patient Account Number: [id number] Date of Birth/Sex: [birth year] (78 y.o. M) Treating RN: Harold Barban Primary Care Ruble Pumphrey: Lavone Orn Other Clinician: Referring Hasini Peachey: Lavone Orn Treating Jerik Falletta/Extender: Melburn Hake, HOYT Weeks in Treatment: 15 Clinic Level of Care Assessment Items TOOL 4 Quantity Score []  - Use when only an EandM is performed on FOLLOW-UP  visit 0 ASSESSMENTS - Nursing Assessment / Reassessment X - Reassessment of Co-morbidities (includes updates in patient status) 1 10 X- 1 5 Reassessment of Adherence to Treatment Plan ASSESSMENTS - Wound and Skin Assessment / Reassessment X - Simple Wound Assessment / Reassessment - one wound 1 5 []  - 0 Complex Wound Assessment / Reassessment - multiple wounds []  - 0 Dermatologic / Skin Assessment (not related to wound area) ASSESSMENTS - Focused Assessment []  - Circumferential Edema Measurements - multi extremities 0 []  - 0 Nutritional Assessment / Counseling / Intervention []  - 0 Lower Extremity Assessment (monofilament, tuning fork, pulses) []  - 0 Peripheral Arterial Disease Assessment (using hand held doppler) ASSESSMENTS - Ostomy and/or Continence Assessment and Care []  - Incontinence Assessment and Management 0 []  - 0 Ostomy Care Assessment and Management (repouching, etc.) PROCESS - Coordination of Care X - Simple Patient / Family Education for ongoing care 1 15 []  - 0 Complex (extensive) Patient / Family Education for ongoing care []  - 0 Staff obtains Programmer, systems, Records, Test Results / Process Orders []  - 0 Staff telephones HHA, Nursing Homes / Clarify orders / etc []  - 0 Routine Transfer to another Facility (non-emergent condition) []  - 0 Routine Hospital Admission (non-emergent condition) []  - 0 New Admissions / Biomedical engineer / Ordering NPWT, Apligraf, etc. []  - 0 Emergency Hospital Admission (emergent condition) X- 1 10 Simple Discharge Coordination [name], [name] ([id number]) []  - 0 Complex (extensive) Discharge Coordination PROCESS - Special Needs []  - Pediatric / Minor Patient Management 0 []  - 0 Isolation Patient Management []  - 0 Hearing / Language / Visual special needs []  - 0 Assessment of Community assistance (transportation, D/C planning, etc.) []  - 0 Additional assistance / Altered mentation []  - 0 Support Surface(s) Assessment  (bed, cushion, seat, etc.) INTERVENTIONS - Wound Cleansing / Measurement X - Simple Wound Cleansing - one wound 1 5 []  - 0 Complex Wound Cleansing - multiple wounds  X- 1 5 Wound Imaging (photographs - any number of wounds) []  - 0 Wound Tracing (instead of photographs) X- 1 5 Simple Wound Measurement - one wound []  - 0 Complex Wound Measurement - multiple wounds INTERVENTIONS - Wound Dressings X - Small Wound Dressing one or multiple wounds 1 10 []  - 0 Medium Wound Dressing one or multiple wounds []  - 0 Large Wound Dressing one or multiple wounds []  - 0 Application of Medications - topical []  - 0 Application of Medications - injection INTERVENTIONS - Miscellaneous []  - External ear exam 0 []  - 0 Specimen Collection (cultures, biopsies, blood, body fluids, etc.) []  - 0 Specimen(s) / Culture(s) sent or taken to Lab for analysis []  - 0 Patient Transfer (multiple staff / Civil Service fast streamer / Similar devices) []  - 0 Simple Staple / Suture removal (25 or less) []  - 0 Complex Staple / Suture removal (26 or more) []  - 0 Hypo / Hyperglycemic Management (close monitor of Blood Glucose) []  - 0 Ankle / Brachial Index (ABI) - do not check if billed separately X- 1 5 Vital Signs [name], [name] ([id number]) Has the patient been seen at the hospital within the last three years: Yes Total Score: 75 Level Of Care: New/Established - Level 2 Electronic Signature(s) Signed: 11/25/2018 4:27:39 PM By: Harold Barban Entered By: Harold Barban on 11/25/2018 16:08:10 [name] ([id number]) -------------------------------------------------------------------------------- Encounter Discharge Information Details [name]. Date of Service: 11/25/2018 3:30 PM Medical Record Number: [id number] Patient Account Number: [id number] Date of Birth/Sex: 10-Sep-1940 (78 y.o. M) Treating RN: Harold Barban Primary Care Makynzi Eastland: Lavone Orn Other Clinician: Referring Danish Ruffins:  Lavone Orn Treating Axtyn Woehler/Extender: Melburn Hake, HOYT Weeks in Treatment: 15 Encounter Discharge Information Items Discharge Condition: Stable Ambulatory Status: Ambulatory Discharge Destination: Home Transportation: Private Auto Accompanied By: wife Schedule Follow-up Appointment: Yes Clinical Summary of Care: Electronic Signature(s) Signed: 11/25/2018 4:27:39 PM By: Harold Barban Entered By: Harold Barban on 11/25/2018 16:13:51 Mundt, [name] ([id number]) -------------------------------------------------------------------------------- Lower Extremity Assessment Details [name]. Date of Service: 11/25/2018 3:30 PM Medical Record Number: [id number] Patient Account Number: [id number] Date of Birth/Sex: [birth year] (78 y.o. M) Treating RN: Montey Hora Primary Care Denesha Brouse: Lavone Orn Other Clinician: Referring Wilsie Kern: Lavone Orn Treating Vitor Overbaugh/Extender: STONE III, HOYT Weeks in Treatment: 15 Edema Assessment Assessed: [Left: No] [Right: No] Edema: [Left: N] [Right: o] Vascular Assessment Pulses: Dorsalis Pedis Palpable: [Left:Yes] Electronic Signature(s) Signed: 11/25/2018 4:21:28 PM By: Montey Hora Entered By: Montey Hora on 11/25/2018 15:47:12 Mangano, [name] ([id number]) -------------------------------------------------------------------------------- Multi Wound Chart Details [name]. Date of Service: 11/25/2018 3:30 PM Medical Record Number: [id number] Patient Account Number: [id number] Date of Birth/Sex: 1940-11-24 (78 y.o. M) Treating RN: Harold Barban Primary Care Gabriell Daigneault: Lavone Orn Other Clinician: Referring Tashanda Fuhrer: Lavone Orn Treating Emmalena Canny/Extender: Melburn Hake, HOYT Weeks in Treatment: 15 Vital Signs Height(in): 69 Pulse(bpm): 91 Weight(lbs): 250 Blood Pressure(mmHg): 145/75 Body Mass Index(BMI): 37 Temperature(F): 98.1 Respiratory Rate 16 (breaths/min): Photos:  [N/A:N/A] Wound Location: Left Foot - Plantar, Proximal N/A N/A Wounding Event: Thermal Burn N/A N/A Primary Etiology: 2nd degree Burn N/A N/A Secondary Etiology: Diabetic Wound/Ulcer of the N/A N/A Lower Extremity Comorbid History: Cataracts, Glaucoma, N/A N/A Hypertension, Type II Diabetes, Osteoarthritis, Neuropathy Date Acquired: 07/29/2018 N/A N/A Weeks of Treatment: 15 N/A N/A Wound Status: Open N/A N/A Measurements L x W x D 0.4x0.4x0.2 N/A N/A (cm) Area (cm) : 0.126 N/A N/A Volume (cm) : 0.025 N/A N/A % Reduction in Area: 99.10% N/A  N/A % Reduction in Volume: 98.20% N/A N/A Classification: Full Thickness Without N/A N/A Exposed Support Structures Exudate Amount: Medium N/A N/A Exudate Type: Purulent N/A N/A Exudate Color: yellow, brown, green N/A N/A Wound Margin: Distinct, outline attached N/A N/A Granulation Amount: Medium (34-66%) N/A N/A Granulation Quality: Pink N/A N/A Necrotic Amount: Medium (34-66%) N/A N/A Exposed Structures: Fat Layer (Subcutaneous N/A N/A Tissue) Exposed: Yes LEMICHAEL, KONIG ([id number]) Fascia: No Tendon: No Muscle: No Joint: No Bone: No Epithelialization: None N/A N/A Treatment Notes Electronic Signature(s) Signed: 11/25/2018 4:27:39 PM By: Harold Barban Previous Signature: 11/25/2018 3:55:52 PM Version By: Worthy Keeler PA-C Entered By: Harold Barban on 11/25/2018 16:07:00 Kincer, [name] ([id number]) -------------------------------------------------------------------------------- [place] Details [name]. Date of Service: 11/25/2018 3:30 PM Medical Record Number: [id number] Patient Account Number: [id number] Date of Birth/Sex: August 19, 1940 (78 y.o. M) Treating RN: Harold Barban Primary Care Timber Lucarelli: Lavone Orn Other Clinician: Referring Konnor Vondrasek: Lavone Orn Treating Janera Peugh/Extender: Melburn Hake, HOYT Weeks in Treatment: 15 Active Inactive Abuse / Safety / Falls / Self  Care Management Nursing Diagnoses: Potential for falls Goals: Patient will not experience any injury related to falls Date Initiated: 08/06/2018 Target Resolution Date: 10/19/2018 Goal Status: Active Interventions: Assess fall risk on admission and as needed Notes: Orientation to the Wound Care Program Nursing Diagnoses: Knowledge deficit related to the wound healing center program Goals: Patient/caregiver will verbalize understanding of the [facility] Program Date Initiated: 08/06/2018 Target Resolution Date: 10/19/2018 Goal Status: Active Interventions: Provide education on orientation to the wound center Notes: Peripheral Neuropathy Nursing Diagnoses: Knowledge deficit related to disease process and management of peripheral neurovascular dysfunction Goals: Patient/caregiver will verbalize understanding of disease process and disease management Date Initiated: 08/06/2018 Target Resolution Date: 10/19/2018 Goal Status: Active Interventions: Provide education on Management of Neuropathy and Related Ulcers CESAR, TOVES ([id number]) Notes: Wound/Skin Impairment Nursing Diagnoses: Impaired tissue integrity Goals: Ulcer/skin breakdown will heal within 14 weeks Date Initiated: 08/06/2018 Target Resolution Date: 10/19/2018 Goal Status: Active Interventions: Assess patient/caregiver ability to obtain necessary supplies Assess patient/caregiver ability to perform ulcer/skin care regimen upon admission and as needed Assess ulceration(s) every visit Notes: Electronic Signature(s) Signed: 11/25/2018 4:27:39 PM By: Harold Barban Entered By: Harold Barban on 11/25/2018 16:06:21 [name] ([id number]) -------------------------------------------------------------------------------- Pain Assessment Details [name]. Date of Service: 11/25/2018 3:30 PM Medical Record Number: [id number] Patient Account Number: [id number] Date of Birth/Sex:  Oct 02, 1940 (78 y.o. M) Treating RN: Harold Barban Primary Care Andie Mungin: Lavone Orn Other Clinician: Referring Delight Bickle: Lavone Orn Treating Nattie Lazenby/Extender: Melburn Hake, HOYT Weeks in Treatment: 15 Active Problems Location of Pain Severity and Description of Pain Patient Has Paino No Site Locations Pain Management and Medication Current Pain Management: Electronic Signature(s) Signed: 11/25/2018 4:13:56 PM By: Paulla Fore, RRT, CHT Signed: 11/25/2018 4:27:39 PM By: Harold Barban Entered By: Lorine Bears on 11/25/2018 15:34:21 Mikolajczak, [name] ([id number]) -------------------------------------------------------------------------------- Patient/Caregiver Education Details [name] Date of Service: 11/25/2018 3:30 PM Medical Record Number: [id number] Patient Account Number: [id number] Date of Birth/Gender: 1940-07-19 (78 y.o. M) Treating RN: Harold Barban Primary Care Physician: Lavone Orn Other Clinician: Referring Physician: Lavone Orn Treating Physician/Extender: Sharalyn Ink in Treatment: 15 Education Assessment Education Provided To: Patient Education Topics Provided Peripheral Neuropathy: Handouts: Neuropathy Methods: Demonstration, Explain/Verbal Responses: State content correctly Wound/Skin Impairment: Handouts: Caring for Your Ulcer Methods: Demonstration, Explain/Verbal Responses: State content correctly Electronic Signature(s) Signed: 11/25/2018 4:27:39 PM By: Harold Barban Entered  By: Harold Barban on 11/25/2018 16:07:25 [name] ([id number]) -------------------------------------------------------------------------------- Wound Assessment Details Patient Name: CLEOTHA, ARRIAZA. Date of Service: 11/25/2018 3:30 PM Medical Record Number: [id number] Patient Account Number: [id number] Date of Birth/Sex: November 21, 1940 (78 y.o. M) Treating RN: Montey Hora Primary Care  Yamil Oelke: Lavone Orn Other Clinician: Referring Samik Balkcom: Lavone Orn Treating Avion Patella/Extender: Melburn Hake, HOYT Weeks in Treatment: 15 Wound Status Wound Number: 2 Primary 2nd degree Burn Etiology: Wound Location: Left Foot - Plantar, Proximal Secondary Diabetic Wound/Ulcer of the Lower Extremity Wounding Event: Thermal Burn Etiology: Date Acquired: 07/29/2018 Wound Status: Open Weeks Of Treatment: 15 Comorbid Cataracts, Glaucoma, Hypertension, Type II Clustered Wound: No History: Diabetes, Osteoarthritis, Neuropathy Photos Wound Measurements Length: (cm) 0.4 Width: (cm) 0.4 Depth: (cm) 0.2 Area: (cm) 0.126 Volume: (cm) 0.025 % Reduction in Area: 99.1% % Reduction in Volume: 98.2% Epithelialization: None Tunneling: No Undermining: No Wound Description Full Thickness Without Exposed Support Classification: Structures Wound Margin: Distinct, outline attached Exudate Medium Amount: Exudate Type: Purulent Exudate Color: yellow, brown, green Foul Odor After Cleansing: No Slough/Fibrino Yes Wound Bed Granulation Amount: Medium (34-66%) Exposed Structure Granulation Quality: Pink Fascia Exposed: No Necrotic Amount: Medium (34-66%) Fat Layer (Subcutaneous Tissue) Exposed: Yes Necrotic Quality: Adherent Slough Tendon Exposed: No Muscle Exposed: No Joint Exposed: No Bone Exposed: No Fuerte, SAAVAN KERKSTRA ([id number]) Treatment Notes Wound #2 (Left, Proximal, Plantar Foot) Notes silvercel, Foam, ABD, conform Peg assist shoe Electronic Signature(s) Signed: 11/25/2018 4:21:28 PM By: Montey Hora Entered By: Montey Hora on 11/25/2018 15:46:51 Zynda, [name] ([id number]) -------------------------------------------------------------------------------- Vitals Details [name]. Date of Service: 11/25/2018 3:30 PM Medical Record Number: [id number] Patient Account Number: [id number] Date of Birth/Sex: 1940-09-22 (78 y.o. M) Treating RN: Harold Barban Primary Care Afton Mikelson: Lavone Orn Other Clinician: Referring Taite Schoeppner: Lavone Orn Treating Janera Peugh/Extender: Melburn Hake, HOYT Weeks in Treatment: 15 Vital Signs Time Taken: 15:35 Temperature (F): 98.1 Height (in): 69 Pulse (bpm): 91 Weight (lbs): 250 Respiratory Rate (breaths/min): 16 Body Mass Index (BMI): 36.9 Blood Pressure (mmHg): 145/75 Reference Range: 80 - 120 mg / dl Electronic Signature(s) Signed: 11/25/2018 4:13:56 PM By: Lorine Bears RCP, RRT, CHT Entered By: Lorine Bears on 11/25/2018 15:37:32

## 2018-12-03 ENCOUNTER — Ambulatory Visit: Payer: Medicare Other | Admitting: Physician Assistant

## 2018-12-09 ENCOUNTER — Ambulatory Visit: Payer: Medicare Other | Admitting: Physician Assistant

## 2018-12-13 ENCOUNTER — Other Ambulatory Visit: Payer: Self-pay

## 2018-12-13 ENCOUNTER — Encounter: Payer: Medicare Other | Attending: Physician Assistant | Admitting: Physician Assistant

## 2018-12-13 DIAGNOSIS — E11621 Type 2 diabetes mellitus with foot ulcer: Secondary | ICD-10-CM | POA: Insufficient documentation

## 2018-12-13 DIAGNOSIS — T23202A Burn of second degree of left hand, unspecified site, initial encounter: Secondary | ICD-10-CM | POA: Diagnosis not present

## 2018-12-13 DIAGNOSIS — L97812 Non-pressure chronic ulcer of other part of right lower leg with fat layer exposed: Secondary | ICD-10-CM | POA: Diagnosis not present

## 2018-12-13 DIAGNOSIS — L97909 Non-pressure chronic ulcer of unspecified part of unspecified lower leg with unspecified severity: Secondary | ICD-10-CM | POA: Diagnosis present

## 2018-12-13 DIAGNOSIS — I10 Essential (primary) hypertension: Secondary | ICD-10-CM | POA: Insufficient documentation

## 2018-12-13 DIAGNOSIS — T25222A Burn of second degree of left foot, initial encounter: Secondary | ICD-10-CM | POA: Insufficient documentation

## 2018-12-13 DIAGNOSIS — Y93G3 Activity, cooking and baking: Secondary | ICD-10-CM | POA: Insufficient documentation

## 2018-12-13 DIAGNOSIS — E1140 Type 2 diabetes mellitus with diabetic neuropathy, unspecified: Secondary | ICD-10-CM | POA: Insufficient documentation

## 2018-12-13 DIAGNOSIS — I890 Lymphedema, not elsewhere classified: Secondary | ICD-10-CM | POA: Insufficient documentation

## 2018-12-13 DIAGNOSIS — X19XXXA Contact with other heat and hot substances, initial encounter: Secondary | ICD-10-CM | POA: Diagnosis not present

## 2018-12-13 DIAGNOSIS — T25221A Burn of second degree of right foot, initial encounter: Secondary | ICD-10-CM | POA: Insufficient documentation

## 2018-12-13 DIAGNOSIS — X150XXA Contact with hot stove (kitchen), initial encounter: Secondary | ICD-10-CM | POA: Insufficient documentation
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
## 2018-12-13 NOTE — Progress Notes (Signed)
CASPEN, PANCIERA ([name]) Visit Report for 12/13/2018 Arrival Information Details [name], [name]. Date of Service: 12/13/2018 1:30 PM Medical Record Number: [name] Patient Account Number: [id number] Date of Birth/Sex: 1940-09-20 (78 y.o. M) Treating RN: [name] Primary Care [name]: [name] Other Clinician: Referring [name]: [name] Treating [name]/Extender: [name], [name] in Treatment: 18 Visit Information History Since Last Visit Added or deleted any medications: Yes Patient Arrived: Cane Any new allergies or adverse reactions: No Arrival Time: 13:33 Had a fall or experienced change in No Accompanied By: wife activities of daily living that may affect Transfer Assistance: None risk of falls: Patient Identification Verified: Yes Signs or symptoms of abuse/neglect since last visito No Secondary Verification Process Completed: Yes Hospitalized since last visit: No Implantable device outside of the clinic excluding No cellular tissue based products placed in the center since last visit: Has Dressing in Place as Prescribed: Yes Pain Present Now: No Electronic Signature(s) Signed: 12/13/2018 3:46:50 PM By: [name] RCP, RRT, CHT Entered By: [name] on 12/13/2018 13:33:48 [name] ([name]) -------------------------------------------------------------------------------- Clinic Level of Care Assessment Details [name]. Date of Service: 12/13/2018 1:30 PM Medical Record Number: [name] Patient Account Number: [id number] Date of Birth/Sex: 11/25/1940 (78 y.o. M) Treating RN: [name] Primary Care Kendarious Gudino: [name] Other Clinician: Referring [name]: [name] Treating [name]: [name], [name] in Treatment: 18 Clinic Level of Care Assessment Items TOOL 4 Quantity Score []  - Use when only an EandM is performed on FOLLOW-UP visit  0 ASSESSMENTS - Nursing Assessment / Reassessment X - Reassessment of Co-morbidities (includes updates in patient status) 1 10 X- 1 5 Reassessment of Adherence to Treatment Plan ASSESSMENTS - Wound and Skin Assessment / Reassessment X - Simple Wound Assessment / Reassessment - one wound 1 5 []  - 0 Complex Wound Assessment / Reassessment - multiple wounds []  - 0 Dermatologic / Skin Assessment (not related to wound area) ASSESSMENTS - Focused Assessment []  - Circumferential Edema Measurements - multi extremities 0 []  - 0 Nutritional Assessment / Counseling / Intervention X- 1 5 Lower Extremity Assessment (monofilament, tuning fork, pulses) []  - 0 Peripheral Arterial Disease Assessment (using hand held doppler) ASSESSMENTS - Ostomy and/or Continence Assessment and Care []  - Incontinence Assessment and Management 0 []  - 0 Ostomy Care Assessment and Management (repouching, etc.) PROCESS - Coordination of Care X - Simple Patient / Family Education for ongoing care 1 15 []  - 0 Complex (extensive) Patient / Family Education for ongoing care X- 1 10 Staff obtains Programmer, systems, Records, Test Results / Process Orders []  - 0 Staff telephones HHA, Nursing Homes / Clarify orders / etc []  - 0 Routine Transfer to another Facility (non-emergent condition) []  - 0 Routine Hospital Admission (non-emergent condition) []  - 0 New Admissions / Biomedical engineer / Ordering NPWT, Apligraf, etc. []  - 0 Emergency Hospital Admission (emergent condition) X- 1 10 Simple Discharge Coordination [name] ([name]) []  - 0 Complex (extensive) Discharge Coordination PROCESS - Special Needs []  - Pediatric / Minor Patient Management 0 []  - 0 Isolation Patient Management []  - 0 Hearing / Language / Visual special needs []  - 0 Assessment of Community assistance (transportation, D/C planning, etc.) []  - 0 Additional assistance / Altered mentation []  - 0 Support Surface(s) Assessment (bed,  cushion, seat, etc.) INTERVENTIONS - Wound Cleansing / Measurement X - Simple Wound Cleansing - one wound 1 5 []  - 0 Complex Wound Cleansing - multiple wounds  X- 1 5 Wound Imaging (photographs - any number of wounds) []  - 0 Wound Tracing (instead of photographs) X- 1 5 Simple Wound Measurement - one wound []  - 0 Complex Wound Measurement - multiple wounds INTERVENTIONS - Wound Dressings []  - Small Wound Dressing one or multiple wounds 0 []  - 0 Medium Wound Dressing one or multiple wounds []  - 0 Large Wound Dressing one or multiple wounds []  - 0 Application of Medications - topical []  - 0 Application of Medications - injection INTERVENTIONS - Miscellaneous []  - External ear exam 0 []  - 0 Specimen Collection (cultures, biopsies, blood, body fluids, etc.) []  - 0 Specimen(s) / Culture(s) sent or taken to Lab for analysis []  - 0 Patient Transfer (multiple staff / Civil Service fast streamer / Similar devices) []  - 0 Simple Staple / Suture removal (25 or less) []  - 0 Complex Staple / Suture removal (26 or more) []  - 0 Hypo / Hyperglycemic Management (close monitor of Blood Glucose) []  - 0 Ankle / Brachial Index (ABI) - do not check if billed separately X- 1 5 Vital Signs [name], [name] ([name]) Has the patient been seen at the hospital within the last three years: Yes Total Score: 80 Level Of Care: New/Established - Level 3 Electronic Signature(s) Signed: 12/13/2018 5:08:51 PM By: [name] Entered By: [name] on 12/13/2018 14:35:21 [name], [name] ([name]) -------------------------------------------------------------------------------- Encounter Discharge Information Details [name]. Date of Service: 12/13/2018 1:30 PM Medical Record Number: [name] Patient Account Number: [id number] Date of Birth/Sex: 1940/10/10 (78 y.o. M) Treating RN: [name] Primary Care Klark Vanderhoef: [name] Other Clinician: Referring Shanica Castellanos: [name] Treating [name]/Extender: [name], [name] in Treatment: 48 Encounter Discharge Information Items Discharge Condition: Stable Ambulatory Status: Cane Discharge Destination: Home Transportation: Private Auto Accompanied By: wife Schedule Follow-up Appointment: Yes Clinical Summary of Care: Electronic Signature(s) Signed: 12/13/2018 5:08:51 PM By: [name] Entered By: [name] on 12/13/2018 14:36:04 Clinch, [name] ([name]) -------------------------------------------------------------------------------- Lower Extremity Assessment Details [name]. Date of Service: 12/13/2018 1:30 PM Medical Record Number: [name] Patient Account Number: [id number] Date of Birth/Sex: January 03, 1941 (78 y.o. M) Treating RN: Harold Barban Primary Care Nykeem Citro: [name] Other Clinician: Referring Dashauna Heymann: [name] Treating Kiet Geer/Extender: [name], [name] in Treatment: 18 Vascular Assessment Pulses: Dorsalis Pedis Palpable: [Right:Yes] Posterior Tibial Palpable: [Right:Yes] Electronic Signature(s) Signed: 12/13/2018 4:16:49 PM By: Harold Barban Entered By: Harold Barban on 12/13/2018 13:43:52 Mitro, [name] ([name]) -------------------------------------------------------------------------------- Multi Wound Chart Details [name]. Date of Service: 12/13/2018 1:30 PM Medical Record Number: [name] Patient Account Number: [id number] Date of Birth/Sex: 05-21-1940 (78 y.o. M) Treating RN: [name] Primary Care Alaine Loughney: [name] Other Clinician: Referring Rutledge Selsor: [name] Treating Yeira Gulden/Extender: [name], [name] in Treatment: 18 Vital Signs Height(in): 69 Pulse(bpm): 80 Weight(lbs): 250 Blood Pressure(mmHg): 146/72 Body Mass Index(BMI): 37 Temperature(F): 98.6 Respiratory Rate 16 (breaths/min): Photos: [N/A:N/A] Wound Location: Left, Proximal, Plantar Foot N/A  N/A Wounding Event: Thermal Burn N/A N/A Primary Etiology: 2nd degree Burn N/A N/A Secondary Etiology: Diabetic Wound/Ulcer of the N/A N/A Lower Extremity Comorbid History: Cataracts, Glaucoma, N/A N/A Hypertension, Type II Diabetes, Osteoarthritis, Neuropathy Date Acquired: 07/29/2018 N/A N/A Kennedy of Treatment: 18 N/A N/A Wound Status: Healed - Epithelialized N/A N/A Measurements L x W x D 0x0x0 N/A N/A (cm) Area (cm) : 0 N/A N/A Volume (cm) : 0 N/A N/A % Reduction in Area: 100.00% N/A N/A % Reduction in Volume: 100.00% N/A N/A  Classification: Full Thickness Without N/A N/A Exposed Support Structures Exudate Amount: Small N/A N/A Exudate Type: Serous N/A N/A Exudate Color: amber N/A N/A Wound Margin: Distinct, outline attached N/A N/A Granulation Amount: Medium (34-66%) N/A N/A Granulation Quality: Pink N/A N/A Necrotic Amount: Medium (34-66%) N/A N/A Exposed Structures: Fat Layer (Subcutaneous N/A N/A Tissue) Exposed: Yes RAEKWON, YOUSE ([name]) Fascia: No Tendon: No Muscle: No Joint: No Bone: No Epithelialization: None N/A N/A Treatment Notes Electronic Signature(s) Signed: 12/13/2018 5:08:51 PM By: [name] Entered By: [name] on 12/13/2018 14:33:44 Whitcomb, [name] ([name]) -------------------------------------------------------------------------------- [place] Details [name]. Date of Service: 12/13/2018 1:30 PM Medical Record Number: [name] Patient Account Number: [id number] Date of Birth/Sex: [birth year] (78 y.o. M) Treating RN: [name] Primary Care Azell Bill: [name] Other Clinician: Referring Blayden Conwell: [name] Treating Vasco Chong/Extender: [name], [name] in Treatment: 71 Active Inactive Abuse / Safety / Falls / Self Care Management Nursing Diagnoses: Potential for falls Goals: Patient will not experience any injury related to falls Date Initiated:  08/06/2018 Target Resolution Date: 10/19/2018 Goal Status: Active Interventions: Assess fall risk on admission and as needed Notes: Peripheral Neuropathy Nursing Diagnoses: Knowledge deficit related to disease process and management of peripheral neurovascular dysfunction Goals: Patient/caregiver will verbalize understanding of disease process and disease management Date Initiated: 08/06/2018 Target Resolution Date: 10/19/2018 Goal Status: Active Interventions: Provide education on Management of Neuropathy and Related Ulcers Notes: Electronic Signature(s) Signed: 12/13/2018 5:08:51 PM By: [name] Entered By: [name] on 12/13/2018 14:33:37 Longhi, [name] ([name]) -------------------------------------------------------------------------------- Pain Assessment Details [name]. Date of Service: 12/13/2018 1:30 PM Medical Record Number: [name] Patient Account Number: [id number] Date of Birth/Sex: 10/16/1940 (78 y.o. M) Treating RN: [name] Primary Care Erwin Nishiyama: [name] Other Clinician: Referring Areg Bialas: [name] Treating Anai Lipson/Extender: [name], [name] in Treatment: 67 Active Problems Location of Pain Severity and Description of Pain Patient Has Paino No Site Locations Pain Management and Medication Current Pain Management: Electronic Signature(s) Signed: 12/13/2018 3:46:50 PM By: Paulla Fore, RRT, CHT Signed: 12/13/2018 5:08:51 PM By: [name] Entered By: [name] on 12/13/2018 13:33:55 Holsomback, [name] ([name]) -------------------------------------------------------------------------------- Patient/Caregiver Education Details [name] Date of Service: 12/13/2018 1:30 PM Medical Record Number: [name] Patient Account Number: [id number] Date of Birth/Gender: 23-Jun-1940 (78 y.o. M) Treating RN: [name] Primary Care Physician:  [name] Other Clinician: Referring Physician: Lavone Kennedy Treating Physician/Extender: Sharalyn Ink in Treatment: 61 Education Assessment Education Provided To: Patient and Caregiver Education Topics Provided Basic Hygiene: Handouts: Other: care of newly healed wound site and calus Methods: Explain/Verbal Responses: State content correctly Electronic Signature(s) Signed: 12/13/2018 5:08:51 PM By: [name] Entered By: [name] on 12/13/2018 14:35:45 Negro, [name] ([name]) -------------------------------------------------------------------------------- Wound Assessment Details [name]. Date of Service: 12/13/2018 1:30 PM Medical Record Number: [name] Patient Account Number: [id number] Date of Birth/Sex: [birth year] (78 y.o. M) Treating RN: [name] Primary Care Nysha Koplin: [name] Other Clinician: Referring Gena Laski: [name] Treating Virgen Belland/Extender: [name], [name] in Treatment: 18 Wound Status Wound Number: 2 Primary 2nd degree Burn Etiology: Wound Location: Left, Proximal, Plantar Foot Secondary Diabetic Wound/Ulcer of the Lower Extremity Wounding Event: Thermal Burn Etiology: Date Acquired: 07/29/2018 Wound Status: Healed - Epithelialized Kennedy Of Treatment: 18 Comorbid Cataracts, Glaucoma, Hypertension, Type II Clustered Wound: No History: Diabetes, Osteoarthritis, Neuropathy Photos Wound Measurements Length: (cm) 0 % Reduc Width: (cm) 0 %  Reduc Depth: (cm) 0 Epithel Area: (cm) 0 Tunnel Volume: (cm) 0 Underm tion in Area: 100% tion in Volume: 100% ialization: None ing: No ining: No Wound Description Full Thickness Without Exposed Support Classification: Structures Wound Margin: Distinct, outline attached Exudate Small Amount: Exudate Type: Serous Exudate Color: amber Foul Odor After Cleansing: No Slough/Fibrino Yes Wound Bed Granulation Amount: Medium (34-66%) Exposed  Structure Granulation Quality: Pink Fascia Exposed: No Necrotic Amount: Medium (34-66%) Fat Layer (Subcutaneous Tissue) Exposed: Yes Necrotic Quality: Adherent Slough Tendon Exposed: No Muscle Exposed: No Joint Exposed: No Bone Exposed: No JARIAH, FISCHEL ([name]) Electronic Signature(s) Signed: 12/13/2018 5:08:51 PM By: [name] Entered By: [name] on 12/13/2018 14:33:17 Ventresca, [name] ([name]) -------------------------------------------------------------------------------- Vitals Details [name]. Date of Service: 12/13/2018 1:30 PM Medical Record Number: [name] Patient Account Number: [id number] Date of Birth/Sex: [birth year] (78 y.o. M) Treating RN: [name] Primary Care Monike Bragdon: [name] Other Clinician: Referring Donette Mainwaring: [name] Treating Rigby Swamy/Extender: [name], [name] in Treatment: 18 Vital Signs Time Taken: 13:30 Temperature (F): 98.6 Height (in): 69 Pulse (bpm): 80 Weight (lbs): 250 Respiratory Rate (breaths/min): 16 Body Mass Index (BMI): 36.9 Blood Pressure (mmHg): 146/72 Reference Range: 80 - 120 mg / dl Electronic Signature(s) Signed: 12/13/2018 3:46:50 PM By: [name] RCP, RRT, CHT Entered By: [name] on 12/13/2018 13:35:16

## 2018-12-27 ENCOUNTER — Encounter: Payer: Medicare Other | Admitting: Physician Assistant

## 2018-12-27 ENCOUNTER — Other Ambulatory Visit: Payer: Self-pay

## 2018-12-27 DIAGNOSIS — E11621 Type 2 diabetes mellitus with foot ulcer: Secondary | ICD-10-CM | POA: Diagnosis not present

## 2018-12-27 NOTE — Progress Notes (Addendum)
[name], [name] ([id number]) Visit Report for 12/27/2018 Arrival Information Details [name], [name]. Date of Service: 12/27/2018 12:45 PM Medical Record Number: [id number] Patient Account Number: [id number] Date of Birth/Sex: 09-Oct-1940 (78 y.o. M) Treating RN: Montey Hora Primary Care Darrik Richman: Lavone Orn Other Clinician: Referring Caton Popowski: Lavone Orn Treating Elie Gragert/Extender: Melburn Hake, HOYT Weeks in Treatment: 20 Visit Information History Since Last Visit Added or deleted any medications: No Patient Arrived: Cane Any new allergies or adverse reactions: No Arrival Time: 12:40 Had a fall or experienced change in No Accompanied By: wife activities of daily living that may affect Transfer Assistance: None risk of falls: Patient Identification Verified: Yes Signs or symptoms of abuse/neglect since last visito No Secondary Verification Process Completed: Yes Hospitalized since last visit: No Implantable device outside of the clinic excluding No cellular tissue based products placed in the center since last visit: Has Dressing in Place as Prescribed: Yes Pain Present Now: No Electronic Signature(s) Signed: 12/27/2018 2:10:09 PM By: Lorine Bears RCP, RRT, CHT Entered By: Lorine Bears on 12/27/2018 12:43:04 [name], [name] ([id number]) -------------------------------------------------------------------------------- Clinic Level of Care Assessment Details [name]. Date of Service: 12/27/2018 12:45 PM Medical Record Number: [id number] Patient Account Number: [id number] Date of Birth/Sex: 1940/06/29 (78 y.o. M) Treating RN: Montey Hora Primary Care Karmella Bouvier: Lavone Orn Other Clinician: Referring Roschelle Calandra: Lavone Orn Treating Quavis Klutz/Extender: Melburn Hake, HOYT Weeks in Treatment: 20 Clinic Level of Care Assessment Items TOOL 4 Quantity Score []  - Use when only an EandM is performed on FOLLOW-UP  visit 0 ASSESSMENTS - Nursing Assessment / Reassessment X - Reassessment of Co-morbidities (includes updates in patient status) 1 10 X- 1 5 Reassessment of Adherence to Treatment Plan ASSESSMENTS - Wound and Skin Assessment / Reassessment []  - Simple Wound Assessment / Reassessment - one wound 0 []  - 0 Complex Wound Assessment / Reassessment - multiple wounds X- 1 10 Dermatologic / Skin Assessment (not related to wound area) ASSESSMENTS - Focused Assessment []  - Circumferential Edema Measurements - multi extremities 0 []  - 0 Nutritional Assessment / Counseling / Intervention X- 1 5 Lower Extremity Assessment (monofilament, tuning fork, pulses) []  - 0 Peripheral Arterial Disease Assessment (using hand held doppler) ASSESSMENTS - Ostomy and/or Continence Assessment and Care []  - Incontinence Assessment and Management 0 []  - 0 Ostomy Care Assessment and Management (repouching, etc.) PROCESS - Coordination of Care X - Simple Patient / Family Education for ongoing care 1 15 []  - 0 Complex (extensive) Patient / Family Education for ongoing care X- 1 10 Staff obtains Programmer, systems, Records, Test Results / Process Orders []  - 0 Staff telephones HHA, Nursing Homes / Clarify orders / etc []  - 0 Routine Transfer to another Facility (non-emergent condition) []  - 0 Routine Hospital Admission (non-emergent condition) []  - 0 New Admissions / Biomedical engineer / Ordering NPWT, Apligraf, etc. []  - 0 Emergency Hospital Admission (emergent condition) X- 1 10 Simple Discharge Coordination [name], [name] ([id number]) []  - 0 Complex (extensive) Discharge Coordination PROCESS - Special Needs []  - Pediatric / Minor Patient Management 0 []  - 0 Isolation Patient Management []  - 0 Hearing / Language / Visual special needs []  - 0 Assessment of Community assistance (transportation, D/C planning, etc.) []  - 0 Additional assistance / Altered mentation []  - 0 Support Surface(s) Assessment  (bed, cushion, seat, etc.) INTERVENTIONS - Wound Cleansing / Measurement []  - Simple Wound Cleansing - one wound 0 []  - 0 Complex Wound Cleansing - multiple wounds []  -  0 Wound Imaging (photographs - any number of wounds) []  - 0 Wound Tracing (instead of photographs) []  - 0 Simple Wound Measurement - one wound []  - 0 Complex Wound Measurement - multiple wounds INTERVENTIONS - Wound Dressings []  - Small Wound Dressing one or multiple wounds 0 []  - 0 Medium Wound Dressing one or multiple wounds []  - 0 Large Wound Dressing one or multiple wounds []  - 0 Application of Medications - topical []  - 0 Application of Medications - injection INTERVENTIONS - Miscellaneous []  - External ear exam 0 []  - 0 Specimen Collection (cultures, biopsies, blood, body fluids, etc.) []  - 0 Specimen(s) / Culture(s) sent or taken to Lab for analysis []  - 0 Patient Transfer (multiple staff / Civil Service fast streamer / Similar devices) []  - 0 Simple Staple / Suture removal (25 or less) []  - 0 Complex Staple / Suture removal (26 or more) []  - 0 Hypo / Hyperglycemic Management (close monitor of Blood Glucose) []  - 0 Ankle / Brachial Index (ABI) - do not check if billed separately X- 1 5 Vital Signs [name], [name] ([id number]) Has the patient been seen at the hospital within the last three years: Yes Total Score: 70 Level Of Care: New/Established - Level 2 Electronic Signature(s) Signed: 12/30/2018 4:13:29 PM By: Montey Hora Entered By: Montey Hora on 12/27/2018 15:45:45 [name], [name] ([id number]) -------------------------------------------------------------------------------- Encounter Discharge Information Details [name]. Date of Service: 12/27/2018 12:45 PM Medical Record Number: [id number] Patient Account Number: [id number] Date of Birth/Sex: [birth year] (78 y.o. M) Treating RN: Montey Hora Primary Care Ainhoa Rallo: Lavone Orn Other Clinician: Referring Ariyannah Pauling: Lavone Orn Treating Rodrick Payson/Extender: Melburn Hake, HOYT Weeks in Treatment: 20 Encounter Discharge Information Items Discharge Condition: Stable Ambulatory Status: Cane Discharge Destination: Home Transportation: Private Auto Accompanied By: spouse Schedule Follow-up Appointment: No Clinical Summary of Care: Electronic Signature(s) Signed: 12/27/2018 3:46:30 PM By: Montey Hora Entered By: Montey Hora on 12/27/2018 15:46:30 [name], [name] ([id number]) -------------------------------------------------------------------------------- Lower Extremity Assessment Details [name]. Date of Service: 12/27/2018 12:45 PM Medical Record Number: [id number] Patient Account Number: [id number] Date of Birth/Sex: 1940-09-01 (78 y.o. M) Treating RN: Harold Barban Primary Care Dinora Hemm: Lavone Orn Other Clinician: Referring Math Brazie: Lavone Orn Treating Sandy Blouch/Extender: Melburn Hake, HOYT Weeks in Treatment: 20 Vascular Assessment Pulses: Dorsalis Pedis Palpable: [Left:Yes] Posterior Tibial Palpable: [Left:Yes] Electronic Signature(s) Signed: 12/27/2018 2:21:28 PM By: Harold Barban Entered By: Harold Barban on 12/27/2018 12:45:50 [name], [name] ([id number]) -------------------------------------------------------------------------------- Multi Wound Chart Details [name]. Date of Service: 12/27/2018 12:45 PM Medical Record Number: [id number] Patient Account Number: [id number] Date of Birth/Sex: 1940/07/16 (78 y.o. M) Treating RN: Montey Hora Primary Care Siniya Lichty: Lavone Orn Other Clinician: Referring Adonai Helzer: Lavone Orn Treating Izsak Meir/Extender: Melburn Hake, HOYT Weeks in Treatment: 20 Vital Signs Height(in): 69 Pulse(bpm): 90 Weight(lbs): 250 Blood Pressure(mmHg): 174/76 Body Mass Index(BMI): 37 Temperature(F): 98.6 Respiratory Rate 18 (breaths/min): Wound Assessments Treatment Notes Electronic Signature(s) Signed:  12/27/2018 3:43:45 PM By: Montey Hora Entered By: Montey Hora on 12/27/2018 12:54:58 [name], [name] ([id number]) -------------------------------------------------------------------------------- [place] Details [name], [name]. Date of Service: 12/27/2018 12:45 PM Medical Record Number: [id number] Patient Account Number: [id number] Date of Birth/Sex: [birth year] (78 y.o. M) Treating RN: Montey Hora Primary Care Talin Feister: Lavone Orn Other Clinician: Referring Aslyn Cottman: Lavone Orn Treating Sherrice Creekmore/Extender: Melburn Hake, HOYT Weeks in Treatment: 20 Active Inactive Electronic Signature(s) Signed: 12/27/2018 3:43:45 PM By: Montey Hora Entered By: Montey Hora on 12/27/2018 12:54:45 [name], [name] ([id number]) --------------------------------------------------------------------------------  Pain Assessment Details [name], CARRAHER. Date of Service: 12/27/2018 12:45 PM Medical Record Number: [id number] Patient Account Number: [id number] Date of Birth/Sex: 07/19/1940 (78 y.o. M) Treating RN: Montey Hora Primary Care Lakenzie Mcclafferty: Lavone Orn Other Clinician: Referring Wael Maestas: Lavone Orn Treating Sharnette Kitamura/Extender: Melburn Hake, HOYT Weeks in Treatment: 20 Active Problems Location of Pain Severity and Description of Pain Patient Has Paino No Site Locations Pain Management and Medication Current Pain Management: Electronic Signature(s) Signed: 12/27/2018 2:10:09 PM By: Paulla Fore, RRT, CHT Signed: 12/27/2018 3:43:45 PM By: Montey Hora Entered By: Lorine Bears on 12/27/2018 12:43:14 Williamson, [name] ([id number]) -------------------------------------------------------------------------------- Patient/Caregiver Education Details [name] Date of Service: 12/27/2018 12:45 PM Medical Record Number: [id number] Patient Account Number: [id number] Date of  Birth/Gender: [birth year] (78 y.o. M) Treating RN: Montey Hora Primary Care Physician: Lavone Orn Other Clinician: Referring Physician: Lavone Orn Treating Physician/Extender: Sharalyn Ink in Treatment: 20 Education Assessment Education Provided To: Patient and Caregiver Education Topics Provided Basic Hygiene: Handouts: Other: care of newly healed wound site Methods: Explain/Verbal Responses: State content correctly Electronic Signature(s) Signed: 12/30/2018 4:13:29 PM By: Montey Hora Entered By: Montey Hora on 12/27/2018 15:46:14 Cates, [name] ([id number]) -------------------------------------------------------------------------------- Speculator Details [name]. Date of Service: 12/27/2018 12:45 PM Medical Record Number: [id number] Patient Account Number: [id number] Date of Birth/Sex: [birth year] (78 y.o. M) Treating RN: Montey Hora Primary Care Quantay Zaremba: Lavone Orn Other Clinician: Referring Giavonna Pflum: Lavone Orn Treating Kotaro Buer/Extender: Melburn Hake, HOYT Weeks in Treatment: 20 Vital Signs Time Taken: 12:42 Temperature (F): 98.6 Height (in): 69 Pulse (bpm): 90 Weight (lbs): 250 Respiratory Rate (breaths/min): 18 Body Mass Index (BMI): 36.9 Blood Pressure (mmHg): 174/76 Reference Range: 80 - 120 mg / dl Electronic Signature(s) Signed: 12/27/2018 2:10:09 PM By: Lorine Bears RCP, RRT, CHT Entered By: Lorine Bears on 12/27/2018 12:44:25

## 2018-12-27 NOTE — Progress Notes (Addendum)
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 history of Angina, Arrhythmia, Congestive Heart Failure, Coronary Artery Disease, Hypotension, Myocardial Infarction, Peripheral Arterial Disease, Peripheral Venous Disease, Phlebitis, Vasculitis Gastrointestinal Denies history of Cirrhosis , Colitis, Crohn s, Hepatitis A, Hepatitis B, Hepatitis C Endocrine Patient has history of Type II Diabetes - ora agents Genitourinary Denies history of End Stage Renal Disease Immunological Denies history of Lupus Erythematosus, Raynaud s, Scleroderma Integumentary (Skin) Denies history of History of Burn, History of pressure wounds Musculoskeletal Patient has history of Osteoarthritis - bilateral knees and ankles Denies history of Gout, Rheumatoid Arthritis, Osteomyelitis Neurologic Patient has history of Neuropathy Denies history of Dementia, Quadriplegia, Paraplegia, Seizure Disorder Oncologic Denies history of Received Chemotherapy, Received Radiation Psychiatric Denies history of Anorexia/bulimia, Confinement Anxiety Review of Systems (ROS) Constitutional Symptoms (General Health) Denies complaints or symptoms of Fatigue, Fever, Chills, Marked  Weight Change. Respiratory Denies complaints or symptoms of Chronic or frequent coughs, Shortness of Breath. Cardiovascular Denies complaints or symptoms of Chest pain, LE edema. Psychiatric Denies complaints or symptoms of Anxiety, Claustrophobia. Objective SANJIT, SCIANDRA ([id number]) Constitutional Well-nourished and well-hydrated in no acute distress. Vitals Time Taken: 12:42 PM, Height: 69 in, Weight: 250 lbs, BMI: 36.9, Temperature: 98.6 F, Pulse: 90 bpm, Respiratory  Rate: 18 breaths/min, Blood Pressure: 174/76 mmHg. Respiratory normal breathing without difficulty. Psychiatric this patient is able to make decisions and demonstrates good insight into disease process. Alert and Oriented x 3. pleasant and cooperative. General Notes: Patient's wound bed currently showed signs of good epithelization at this point and there is no open wound noted overall I am very pleased with where things stand and I noted no drainage since he was last here in the clinic. 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 I would recommend currently that we discontinue wound care services for the patient he is in agreement with the plan and he is very pleased as is been quite a road. 2. I would recommend as well that he can go back into using his normal sock and shoe I do not see any need for a protective bandage even at this point which is good news. 3. I did advise that he needs to always wear shoes and socks no matter what obviously this all  occurred as a result of him going without shoes or socks [place] feet but nonetheless with his neuropathy and he literally feels nothing he needs to always have shoes on in my opinion. We will see him back for follow-up visit as needed. ANTHONEE, BORDEN ([id number]) Electronic Signature(s) Signed: 12/27/2018 1:16:16 PM By: Worthy Keeler PA-C Entered By: Worthy Keeler on 12/27/2018 13:16:16 Mcenaney, [name] ([id number]) -------------------------------------------------------------------------------- ROS/PFSH Details [name] Date of Service: 12/27/2018 12:45 PM Medical Record Number: [id number] Patient Account Number: [id number] Date of Birth/Sex: June 15, 1940 (78 y.o. M) Treating RN: Montey Hora Primary Care Provider: Lavone Orn Other Clinician: Referring Provider: Lavone Orn Treating Provider/Extender: Melburn Hake, Alaria Oconnor Weeks in Treatment: 20 Information Obtained From Patient Constitutional Symptoms (General Health) Complaints and Symptoms: Negative for: Fatigue; Fever; Chills; Marked Weight Change Respiratory Complaints and Symptoms: Negative for: Chronic or frequent coughs; Shortness of Breath Medical History: Negative for: Aspiration; Asthma; Chronic Obstructive Pulmonary Disease (COPD); Pneumothorax; Sleep Apnea; Tuberculosis Cardiovascular Complaints and Symptoms: Negative for: Chest pain; LE edema Medical History: Positive for: Hypertension Negative for: Angina; Arrhythmia; Congestive Heart Failure; Coronary Artery Disease; Hypotension; Myocardial Infarction; Peripheral Arterial Disease; Peripheral Venous Disease; Phlebitis; Vasculitis Psychiatric Complaints and Symptoms: Negative for: Anxiety; Claustrophobia Medical History: Negative for: Anorexia/bulimia; Confinement Anxiety Eyes Medical History: Positive for: Cataracts - both; Glaucoma Negative for: Optic Neuritis Ear/Nose/Mouth/Throat Medical  History: Negative for: Chronic sinus problems/congestion; Middle ear problems Hematologic/Lymphatic Medical History: Negative for: Anemia; Hemophilia; Human Immunodeficiency Virus; Lymphedema; Sickle Cell Disease JVION, MIAZGA ([id number]) Gastrointestinal Medical History: Negative for: Cirrhosis ; Colitis; Crohnos; Hepatitis A; Hepatitis B; Hepatitis C Endocrine Medical History: Positive for: Type II Diabetes - ora agents Treated with: Oral agents Genitourinary Medical History: Negative for: End Stage Renal Disease Immunological Medical History: Negative for: Lupus Erythematosus; Raynaudos; Scleroderma Integumentary (Skin) Medical History: Negative for: History of Burn; History of pressure wounds Musculoskeletal Medical History: Positive for: Osteoarthritis - bilateral knees and ankles Negative for: Gout; Rheumatoid Arthritis; Osteomyelitis Neurologic Medical History: Positive for: Neuropathy Negative for: Dementia;  Quadriplegia; Paraplegia; Seizure Disorder Oncologic Medical History: Negative for: Received Chemotherapy; Received Radiation HBO Extended History Items Eyes: Eyes: Cataracts Glaucoma Immunizations Pneumococcal Vaccine: Received Pneumococcal Vaccination: Yes Implantable Devices None Family and Social History Cancer: No; Diabetes: No; Heart Disease: No; Hereditary Spherocytosis: No; Hypertension: No; Kidney Disease: No; Lung Disease: No; Seizures: No; Stroke: No; Thyroid Problems: No; Tuberculosis: No; Never smoker; Marital Status - Married; [place]. ([id number]) Alcohol Use: Never; Drug Use: No History; Caffeine Use: Moderate; Financial Concerns: No; Food, Clothing or Shelter Needs: No; Support System Lacking: No; Transportation Concerns: No Physician Affirmation I have reviewed and agree with the above information. Electronic Signature(s) Signed: 12/27/2018 3:43:45 PM By: Montey Hora Signed: 12/27/2018 4:46:07 PM By: Worthy Keeler  PA-C Entered By: Worthy Keeler on 12/27/2018 13:15:24 Helm, [name] ([id number]) -------------------------------------------------------------------------------- SuperBill Details [name] Date of Service: 12/27/2018 Medical Record Number: [id number] Patient Account Number: [id number] Date of Birth/Sex: 1940-02-24 (78 y.o. M) Treating RN: Montey Hora Primary Care Provider: Lavone Orn Other Clinician: Referring Provider: Lavone Orn Treating Provider/Extender: Melburn Hake, Lavergne Hiltunen Weeks in Treatment: 20 Diagnosis Coding ICD-10 Codes Code Description [id number] Burn of second degree of right foot, initial encounter T25.222A Burn of second degree of left foot, initial encounter E11.621 Type 2 diabetes mellitus with foot ulcer E11.40 Type 2 diabetes mellitus with diabetic neuropathy, unspecified T23.202A Burn of second degree of left hand, unspecified site, initial encounter I89.0 Lymphedema, not elsewhere classified L97.812 Non-pressure chronic ulcer of other part of right lower leg with fat layer exposed Facility Procedures CPT4 Code: [id number] Description: [phone] - WOUND CARE VISIT-LEV 2 EST PT Modifier: Quantity: 1 Physician Procedures CPT4 Code: [id number] Description: [id number] - WC PHYS LEVEL 3 - EST PT ICD-10 Diagnosis Description T25.222A Burn of second degree of left foot, initial encounter E11.40 Type 2 diabetes mellitus with diabetic neuropathy, unspecif E11.621 Type 2 diabetes mellitus with foot ulcer  I89.0 Lymphedema, not elsewhere classified Modifier: ied Quantity: 1 Electronic Signature(s) Signed: 12/27/2018 3:45:55 PM By: Montey Hora Signed: 12/27/2018 4:46:07 PM By: Worthy Keeler PA-C Previous Signature: 12/27/2018 1:16:45 PM Version By: Worthy Keeler PA-C Entered By: Montey Hora on 12/27/2018 15:45:55

## 2019-06-12 ENCOUNTER — Other Ambulatory Visit: Payer: Self-pay | Admitting: Internal Medicine

## 2019-06-12 ENCOUNTER — Ambulatory Visit
Admission: RE | Admit: 2019-06-12 | Discharge: 2019-06-12 | Disposition: A | Discharge status: Home / Self Care | Payer: Medicare Other | Source: Ambulatory Visit | Attending: Internal Medicine | Admitting: Internal Medicine

## 2019-06-12 DIAGNOSIS — R0609 Other forms of dyspnea: Secondary | ICD-10-CM

## 2019-06-12 IMAGING — CR DG CHEST 2V
2 series · 2 of 2 positions shown · non-contrast
Comparison: [DATE]

CLINICAL DATA: Dyspnea on exertion.  Weakness.

EXAM:
CHEST - 2 VIEW

[w chest pa *]
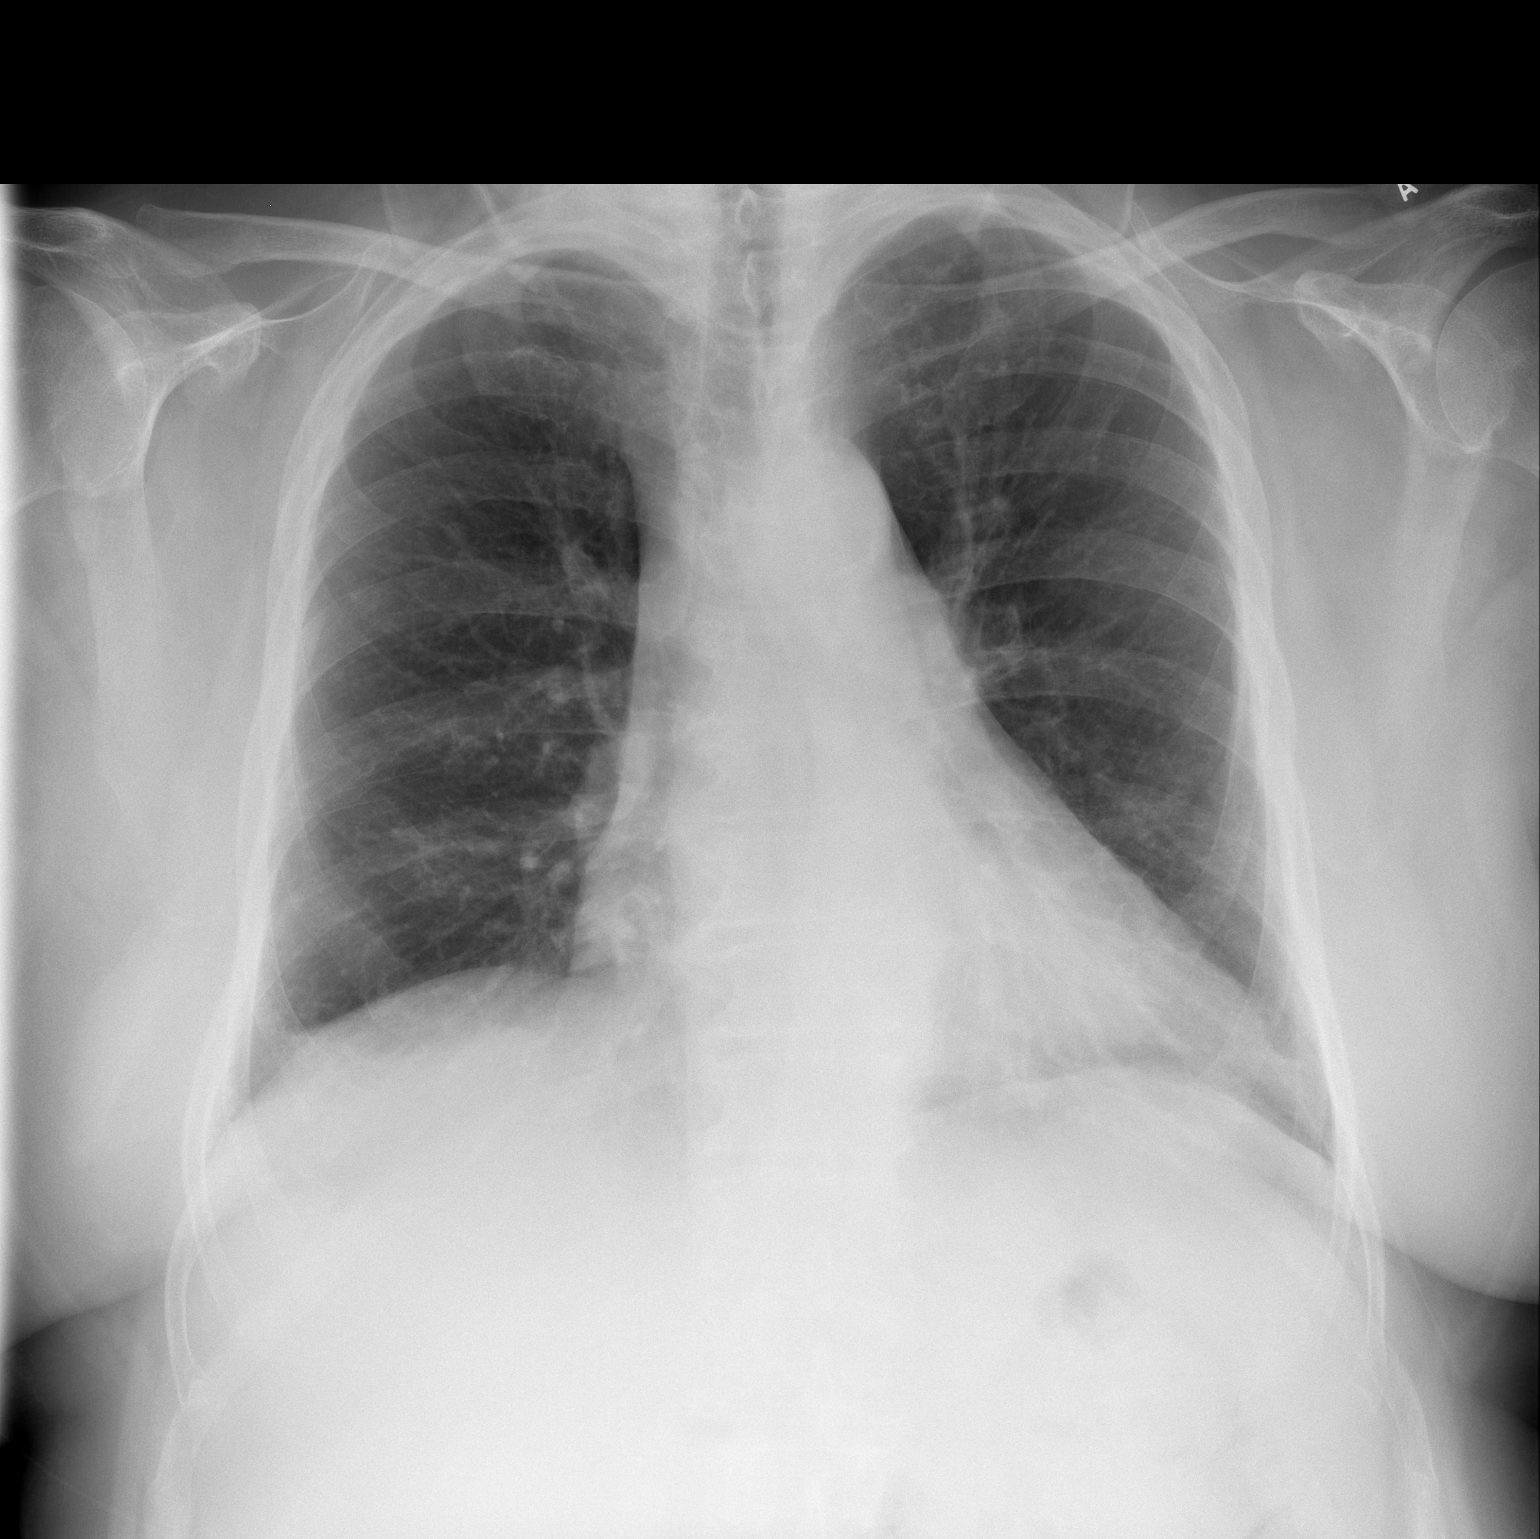

[w chest lat]
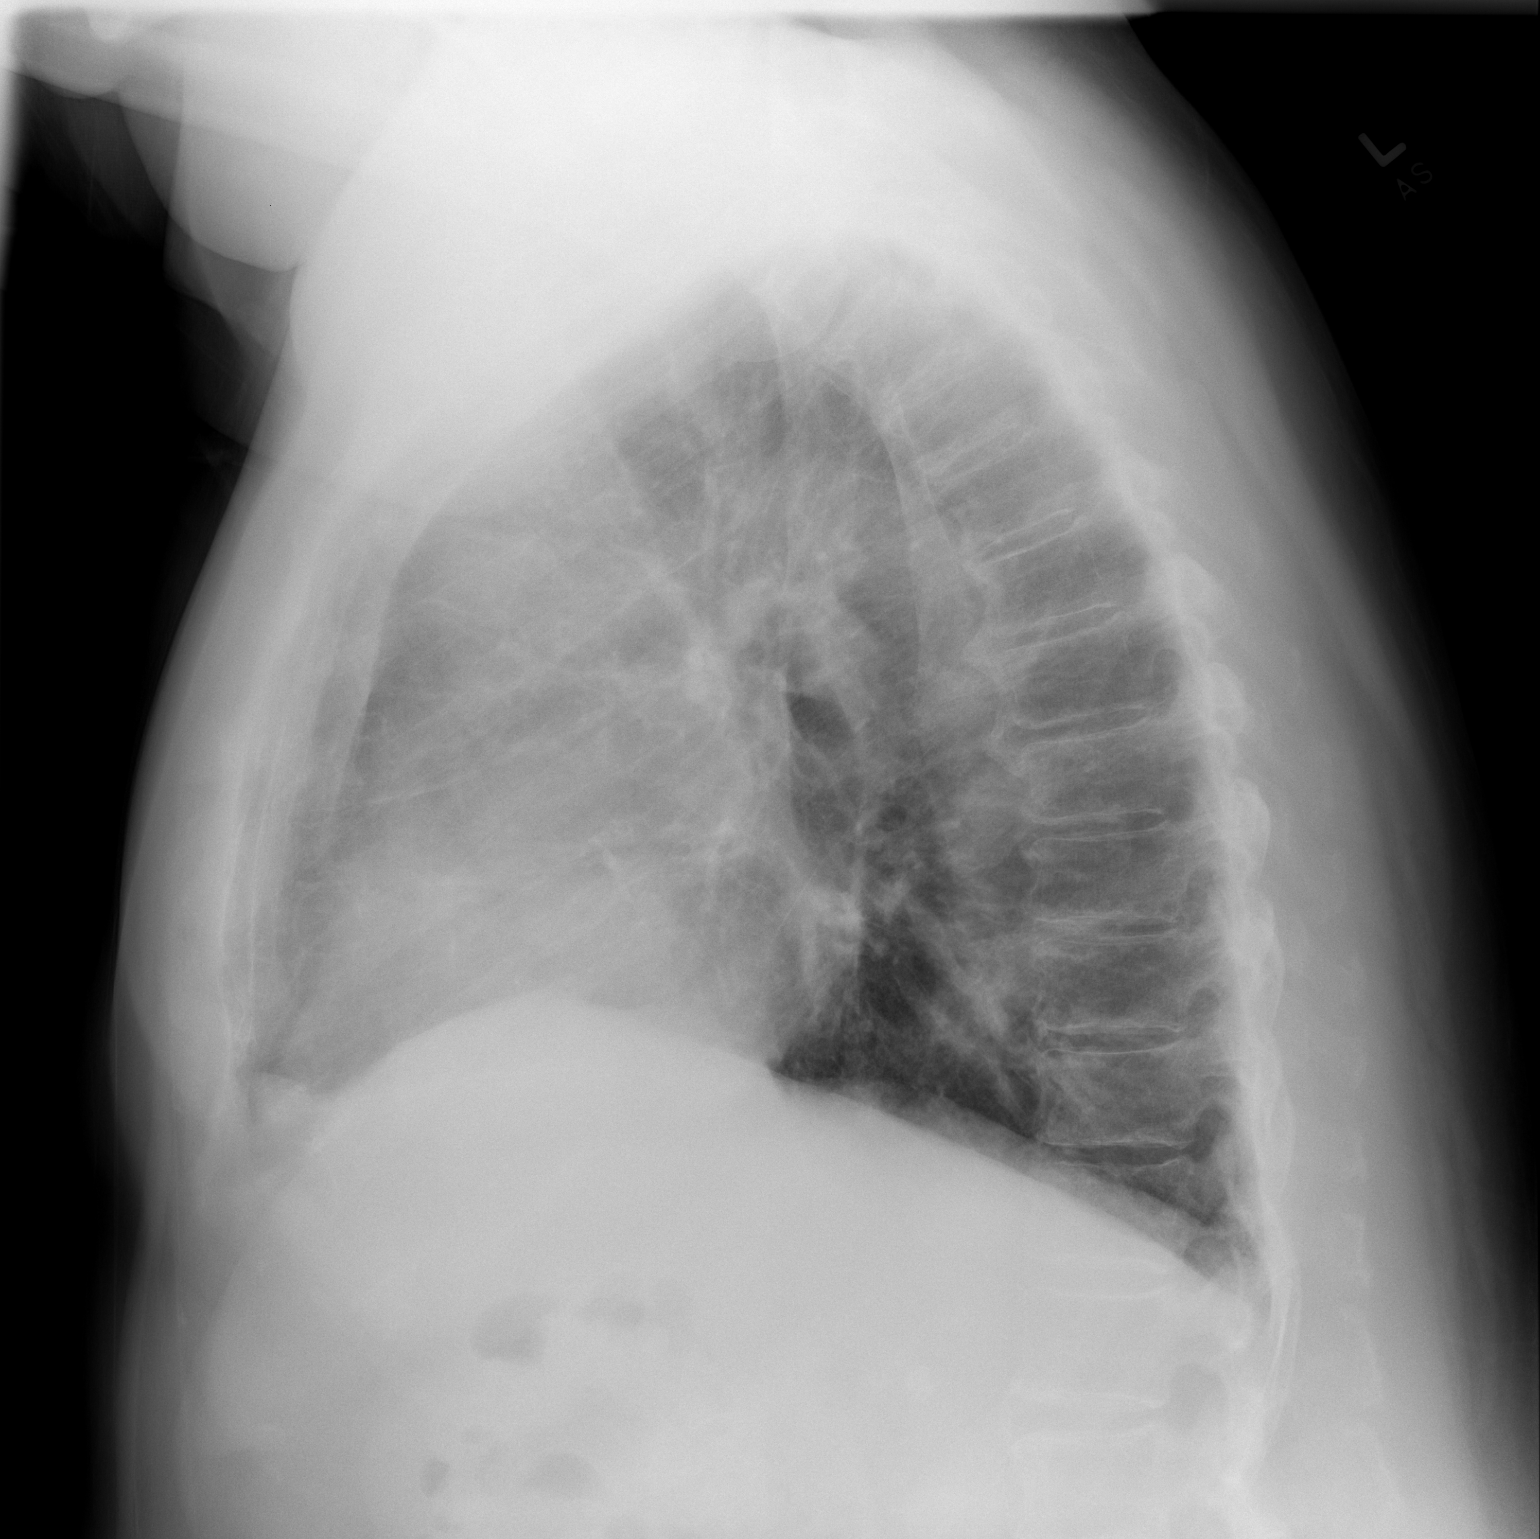

[2 of 2 positions shown; findings below may reference images not displayed]

FINDINGS: Mild hyperinflation. Moderate thoracic spondylosis. Midline trachea.
Mild cardiomegaly. Atherosclerosis in the transverse aorta. No
pleural effusion or pneumothorax. No congestive failure. Clear
lungs.
IMPRESSION: No acute cardiopulmonary disease.

Aortic Atherosclerosis ([HR]-[HR]).

Mild cardiomegaly and hyperinflation.

## 2019-07-07 ENCOUNTER — Telehealth: Payer: Self-pay

## 2019-07-07 NOTE — Telephone Encounter (Signed)
FAXED NOTES TO [facility] 

## 2019-07-10 DIAGNOSIS — R000 Tachycardia, unspecified: Secondary | ICD-10-CM | POA: Insufficient documentation

## 2019-07-10 DIAGNOSIS — R0609 Other forms of dyspnea: Secondary | ICD-10-CM | POA: Insufficient documentation

## 2019-07-10 NOTE — Progress Notes (Signed)
Cardiology Office Note   Date:  07/11/2019   ID:  [name], DOB 11/03/1940, MRN [id number]  PCP:  Lavone Orn, MD  Cardiologist:   No primary care provider on file. Referring:  Lavone Orn, MD   Chief Complaint  Patient presents with  . Shortness of Breath     History of Present Illness: [name] is a 79 y.o. male who is referred by Lavone Orn, MD for evaluation of DOE and tachycardia. He had an echo in 2015 with an EF of 65%.  He had mild AS.   Not sure why we had this study.  He does not recall being told he had a murmur.  He gets around slowly with some balance issues and back pain.  He has a cane.  He says that when he goes from his kitchen into his back room more his bathroom he will be short of breath.  He is not describing PND or orthopnea.  He is not describing chest pressure, neck or arm discomfort when he exercises although he has sporadically had some chest discomfort.  He says that this feels like heartburn and it comes on occasionally.  He cannot quantify or qualify it.  He is not describing radiation to his jaw or to his arms.  He is not describing new palpitations, presyncope or syncope although he does have a rapid heart rate is walking into the office.  He was dyspneic walking into the office although his oxygen saturation was at 94%.  He does report for some mild ankle swelling and some mild orthostatic symptoms.  He has not had any other cardiac work-up other than that mentioned above.   Past Medical History:  Diagnosis Date  . Adenomatous colon polyp   . Allergic rhinitis   . BMI 40.0-44.9, adult ([place])   . Chronic insomnia   . Chronic low back pain   . Constipation, unspecified   . Diabetes mellitus ([place]) 2015  . DJD (degenerative joint disease) of knee   . Hypercalcemia   . Hypertension   . Hypertriglyceridemia   . 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 Lumbar facet arthropathy   . Morbid obesity ([place])   . Neuropathy   . Nonallergic rhinitis     . OSA (obstructive sleep apnea)   . Other chronic pain   . Peripheral axonal neuropathy   . Upper GI bleed    due to aspirin  . Venous insufficiency   . Venous stasis dermatitis of both lower extremities     Past Surgical History:  Procedure Laterality Date  . cataract     Bilateral  . GLAUCOMA SURGERY    . HAND SURGERY Left    s/p amputation of left finger     Current Outpatient Medications  Medication Sig Dispense Refill  . acetaminophen (TYLENOL EX ST ARTHRITIS PAIN) 500 MG tablet Take 1,000 mg by mouth every 6 (six) hours as needed for moderate pain.    [name] Kitchen azelastine (ASTELIN) 137 MCG/SPRAY nasal spray Place 1 spray into both nostrils as needed.     . Dulaglutide (TRULICITY) 1.60 VP/7.1GG SOPN Inject into the skin.    . DULoxetine (CYMBALTA) 60 MG capsule Take 60 mg by mouth daily.    . furosemide (LASIX) 20 MG tablet Take 20 mg by mouth.    . gabapentin (NEURONTIN) 300 MG capsule Take 1 capsule (300 mg total) by mouth at bedtime. 90 capsule 3  . gabapentin (NEURONTIN) 600 MG tablet Take 600 mg  by mouth 3 (three) times daily.    [name] Kitchen HYDROcodone-acetaminophen (NORCO/VICODIN) 5-325 MG per tablet Take 1 tablet by mouth every 4 (four) hours as needed for moderate pain or severe pain. 10 tablet 0  . ipratropium (ATROVENT) 0.03 % nasal spray Place 2 sprays into both nostrils every 12 (twelve) hours.    [name] Kitchen lisinopril-hydrochlorothiazide (PRINZIDE,ZESTORETIC) 10-12.5 MG per tablet Take 1 tablet by mouth daily.    . metFORMIN (GLUCOPHAGE) 500 MG tablet 2 (two) times daily with a meal.     . Multiple Vitamin (MULTIVITAMIN) tablet Take 1 tablet by mouth daily.    . potassium chloride (MICRO-K) 10 MEQ CR capsule Take 10 mEq by mouth 2 (two) times daily.    . traZODone (DESYREL) 100 MG tablet Take 100 mg by mouth at bedtime.     . traZODone (DESYREL) 50 MG tablet Take 50 mg by mouth at bedtime.    . triamcinolone cream (KENALOG) 0.1 % Apply 1 application topically 2 (two) times daily.    [name] Kitchen  lidocaine (XYLOCAINE) 5 % ointment Apply to feet three times daily as needed.  Wear gloves when applying. 50 g 3   No current facility-administered medications for this visit.    Allergies:   Ambien [zolpidem tartrate] and Aspirin    Social History:  The patient  reports that he has quit smoking. He has never used smokeless tobacco. He reports that he does not drink alcohol and does not use drugs.   Family History:  The patient's family history includes Gout in his father; Healthy in his brother and son; Osteoarthritis in his mother.    ROS:  Please see the history of present illness.   Otherwise, review of systems are positive for snoring calf tightness, neuropathy, balance issues, back pain, difficulty hearing.   All other systems are reviewed and negative.    PHYSICAL EXAM: VS:  BP 118/78   Pulse 89  , BMI There is no height or weight on file to calculate BMI. GENERAL:  Well appearing HEENT:  Pupils equal round and reactive, fundi not visualized, oral mucosa unremarkable NECK:  No jugular venous distention, waveform within normal limits, carotid upstroke brisk and symmetric, no bruits, no thyromegaly LYMPHATICS:  No cervical, inguinal adenopathy LUNGS:  Clear to auscultation bilaterally BACK:  No CVA tenderness CHEST:  Unremarkable HEART:  PMI not displaced or sustained,S1 and S2 within normal limits, no S3, no S4, no clicks, no rubs, soft apical early peaking 2 out of 6 systolic murmur nonradiating, no diastolic murmurs ABD:  Flat, positive bowel sounds normal in frequency in pitch, no bruits, no rebound, no guarding, no midline pulsatile mass, no hepatomegaly, no splenomegaly EXT:  2 plus pulses throughout, no edema, no cyanosis no clubbing, missing index finger.   SKIN:  No rashes no nodules NEURO:  Cranial nerves II through XII grossly intact, motor grossly intact throughout PSYCH:  Cognitively intact, oriented to person place and time    EKG:  EKG is ordered today. The ekg  ordered today demonstrates sinus rhythm, rate 89, axis within normal limits, intervals within normal limits, no acute ST-T wave changes.   Recent Labs: No results found for requested labs within last 8760 hours.    Lipid Panel No results found for: CHOL, TRIG, HDL, CHOLHDL, VLDL, LDLCALC, LDLDIRECT    Wt Readings from Last 3 Encounters:  09/10/13 258 lb 6 oz (117.2 kg)      Other studies Reviewed: Additional studies/ records that were reviewed today include: Labs .  Review of the above records demonstrates:  Please see elsewhere in the note.     ASSESSMENT AND PLAN:  CHEST PAIN: This is atypical.  I am going to start with the echo as below and pending these results we will have a low threshold for further ischemia work-up likely a Lexiscan Myoview.  DOE: I will check an echocardiogram.  I will check a BNP level.  If these are both unremarkable with only mild AS and preserved ejection fraction I will proceed as above.  I do see some recent blood work and he was not anemic.  Thyroid was okay.  He does have some renal insufficiency.  CKD: Creatinine was 1.62.  I will defer to his primary provider.  COVID EDUCATION:  He had his vaccine.    Current medicines are reviewed at length with the patient today.  The patient does not have concerns regarding medicines.  The following changes have been made:  no change  Labs/ tests ordered today include:   Orders Placed This Encounter  Procedures  . B Nat Peptide  . EKG 12-Lead  . ECHOCARDIOGRAM COMPLETE     Disposition:   FU with me as needed based on the results of the above.     Signed, Minus Breeding, MD  07/11/2019 2:48 PM    [facility]

## 2019-07-11 ENCOUNTER — Other Ambulatory Visit: Payer: Self-pay

## 2019-07-11 ENCOUNTER — Ambulatory Visit: Payer: Medicare Other | Admitting: Cardiology

## 2019-07-11 ENCOUNTER — Encounter: Payer: Self-pay | Admitting: Cardiology

## 2019-07-11 VITALS — BP 118/78 | HR 89

## 2019-07-11 DIAGNOSIS — I350 Nonrheumatic aortic (valve) stenosis: Secondary | ICD-10-CM

## 2019-07-11 DIAGNOSIS — R0600 Dyspnea, unspecified: Secondary | ICD-10-CM

## 2019-07-11 DIAGNOSIS — R000 Tachycardia, unspecified: Secondary | ICD-10-CM

## 2019-07-11 DIAGNOSIS — R0609 Other forms of dyspnea: Secondary | ICD-10-CM

## 2019-07-11 NOTE — Patient Instructions (Signed)
Medication Instructions:  Your physician recommends that you continue on your current medications as directed. Please refer to the Current Medication list given to you today.  Lab Work: BNP TODAY   If you have labs (blood work) drawn today and your tests are completely normal, you will receive your results only by: [name] Kitchen MyChart Message (if you have MyChart) OR . A paper copy in the mail If you have any lab test that is abnormal or we need to change your treatment, we will call you to review the results.  Testing/Procedures: Your physician has requested that you have an echocardiogram. Echocardiography is a painless test that uses sound waves to create images of your heart. It provides your doctor with information about the size and shape of your heart and how well your heart's chambers and valves are working. This procedure takes approximately one hour. There are no restrictions for this procedure. [address]   Follow-Up: At [facility], you and your health needs are our priority.  As part of our continuing mission to provide you with exceptional heart care, we have created designated Provider Care Teams.  These Care Teams include your primary Cardiologist (physician) and Advanced Practice Providers (APPs -  Physician Assistants and Nurse Practitioners) who all work together to provide you with the care you need, when you need it.  We recommend signing up for the patient portal called "MyChart".  Sign up information is provided on this After Visit Summary.  MyChart is used to connect with patients for Virtual Visits (Telemedicine).  Patients are able to view lab/test results, encounter notes, upcoming appointments, etc.  Non-urgent messages can be sent to your provider as well.   To learn more about what you can do with MyChart, go to [email or web address].    Your next appointment:   AS NEEDED

## 2019-07-12 LAB — BRAIN NATRIURETIC PEPTIDE: BNP: 42 pg/mL (ref 0.0–100.0)

## 2019-07-30 ENCOUNTER — Other Ambulatory Visit: Payer: Self-pay

## 2019-07-30 ENCOUNTER — Ambulatory Visit (HOSPITAL_COMMUNITY): Payer: Medicare Other | Attending: Cardiovascular Disease

## 2019-07-30 DIAGNOSIS — R000 Tachycardia, unspecified: Secondary | ICD-10-CM | POA: Diagnosis not present

## 2019-07-30 DIAGNOSIS — I350 Nonrheumatic aortic (valve) stenosis: Secondary | ICD-10-CM | POA: Diagnosis not present

## 2019-07-30 DIAGNOSIS — R0609 Other forms of dyspnea: Secondary | ICD-10-CM

## 2019-07-30 DIAGNOSIS — R0600 Dyspnea, unspecified: Secondary | ICD-10-CM | POA: Diagnosis not present

## 2019-07-30 HISTORY — DX: Nonrheumatic aortic (valve) stenosis: I35.0

## 2019-07-30 LAB — ECHOCARDIOGRAM COMPLETE
AR max vel: 1.47 cm2
AV Area VTI: 1.37 cm2
AV Area mean vel: 1.58 cm2
AV Mean grad: 15 mmHg
AV Peak grad: 26.2 mmHg
Ao pk vel: 2.56 m/s
Area-P 1/2: 2.87 cm2
S' Lateral: 3.2 cm

## 2019-08-15 ENCOUNTER — Telehealth: Payer: Self-pay | Admitting: *Deleted

## 2019-08-15 DIAGNOSIS — I429 Cardiomyopathy, unspecified: Secondary | ICD-10-CM

## 2019-08-15 NOTE — Telephone Encounter (Signed)
-----   Message from Minus Breeding, MD sent at 08/10/2019  2:57 PM EDT ----- He has mild AS.  The EF is low normal without wall motion abnormalities.  I would suggest follow up [facility] as outlined in my note.  Call Mr. Verbrugge with the results and send results to Lavone Orn, MD

## 2019-08-15 NOTE — Telephone Encounter (Signed)
Left message to call back  

## 2019-08-19 NOTE — Telephone Encounter (Signed)
Follow Up  Patient returning call. Please give patient/patient's wife a call back.

## 2019-08-19 NOTE — Telephone Encounter (Signed)
Spoke with pt wife, aware of results and lexiscan myoview discussed and patient wife agreed with scheduling. Order placed and sent to [facility] for scheduling.

## 2019-09-05 ENCOUNTER — Telehealth (HOSPITAL_COMMUNITY): Payer: Self-pay

## 2019-09-05 NOTE — Telephone Encounter (Signed)
Encounter complete. 

## 2019-09-09 ENCOUNTER — Telehealth (HOSPITAL_COMMUNITY): Payer: Self-pay

## 2019-09-09 NOTE — Telephone Encounter (Signed)
Encounter complete. 

## 2019-09-10 ENCOUNTER — Other Ambulatory Visit: Payer: Self-pay

## 2019-09-10 ENCOUNTER — Ambulatory Visit (HOSPITAL_COMMUNITY)
Admission: RE | Admit: 2019-09-10 | Discharge: 2019-09-10 | Disposition: A | Discharge status: Home / Self Care | Payer: Medicare Other | Source: Ambulatory Visit | Attending: Cardiovascular Disease | Admitting: Cardiovascular Disease

## 2019-09-10 DIAGNOSIS — I429 Cardiomyopathy, unspecified: Secondary | ICD-10-CM

## 2019-09-10 MED ORDER — TECHNETIUM TC 99M TETROFOSMIN IV KIT
31.0000 | PACK | Freq: Once | INTRAVENOUS | Status: AC | PRN
  Administered 2019-09-10: 31 via INTRAVENOUS
  Filled 2019-09-10: qty 31

## 2019-09-10 MED ORDER — REGADENOSON 0.4 MG/5ML IV SOLN
0.4000 mg | Freq: Once | INTRAVENOUS | Status: AC
  Administered 2019-09-10: 0.4 mg via INTRAVENOUS

## 2019-09-11 ENCOUNTER — Ambulatory Visit (HOSPITAL_COMMUNITY)
Admission: RE | Admit: 2019-09-11 | Discharge: 2019-09-11 | Disposition: A | Discharge status: Home / Self Care | Payer: Medicare Other | Source: Ambulatory Visit | Attending: Cardiology | Admitting: Cardiology

## 2019-09-11 LAB — MYOCARDIAL PERFUSION IMAGING
LV dias vol: 122 mL (ref 62–150)
LV sys vol: 59 mL
Peak HR: 88 {beats}/min
Rest HR: 76 {beats}/min
SDS: 2
SRS: 2
SSS: 4
TID: 0.89

## 2019-09-11 MED ORDER — TECHNETIUM TC 99M TETROFOSMIN IV KIT
29.8000 | PACK | Freq: Once | INTRAVENOUS | Status: AC | PRN
  Administered 2019-09-11: 29.8 via INTRAVENOUS

## 2019-11-10 ENCOUNTER — Other Ambulatory Visit: Payer: Self-pay

## 2019-11-10 ENCOUNTER — Ambulatory Visit: Payer: Medicare Other | Attending: Internal Medicine

## 2019-11-10 DIAGNOSIS — M6281 Muscle weakness (generalized): Secondary | ICD-10-CM | POA: Insufficient documentation

## 2019-11-10 DIAGNOSIS — M79604 Pain in right leg: Secondary | ICD-10-CM | POA: Diagnosis present

## 2019-11-10 DIAGNOSIS — R296 Repeated falls: Secondary | ICD-10-CM | POA: Diagnosis not present

## 2019-11-10 DIAGNOSIS — R2681 Unsteadiness on feet: Secondary | ICD-10-CM | POA: Insufficient documentation

## 2019-11-10 DIAGNOSIS — M79605 Pain in left leg: Secondary | ICD-10-CM | POA: Diagnosis present

## 2019-11-10 DIAGNOSIS — R293 Abnormal posture: Secondary | ICD-10-CM | POA: Insufficient documentation

## 2019-11-10 NOTE — Therapy (Signed)
[place], [place], [address] Phone: [phone]   Fax:  [phone]  Physical Therapy Evaluation  Patient Details  Name: [name] MRN: [id number] Date of Birth: 1940-04-20 Referring Provider (PT): Lavone Orn , MD   Encounter Date: 11/10/2019   PT End of Session - 11/10/19 1400    Visit Number 1    Number of Visits 12    Date for PT Re-Evaluation 12/19/19    Authorization Type UHC MCR    PT Start Time 0200    PT Stop Time 0245    PT Time Calculation (min) 45 min    Activity Tolerance Patient tolerated treatment well    Behavior During Therapy [facility] for tasks assessed/performed           Past Medical History:  Diagnosis Date  . Adenomatous colon polyp   . Allergic rhinitis   . BMI 40.0-44.9, adult ([place])   . Chronic insomnia   . Chronic low back pain   . Constipation, unspecified   . Diabetes mellitus ([place]) 2015  . DJD (degenerative joint disease) of knee   . Hypercalcemia   . Hypertension   . Hypertriglyceridemia   . 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 Lumbar facet arthropathy   . Morbid obesity ([place])   . Neuropathy   . Nonallergic rhinitis   . OSA (obstructive sleep apnea)   . Other chronic pain   . Peripheral axonal neuropathy   . Upper GI bleed    due to aspirin  . Venous insufficiency   . Venous stasis dermatitis of both lower extremities     Past Surgical History:  Procedure Laterality Date  . cataract     Bilateral  . GLAUCOMA SURGERY    . HAND SURGERY Left    s/p amputation of left finger    There were no vitals filed for this visit.    Subjective Assessment - 11/10/19 1405    Subjective He reports history of trouble walking.    He has issues with walking worse in past year. Has neuropathy.  Last fall was  a week go in bathroom. He reports legs give away and loses balance.  He reports pain in lower legs due to neuropathy.    Pertinent History peripheral neuropathy,  lower back pain    Limitations Walking;Standing;House hold activities    How long can you walk comfortably? household distances.    Patient Stated Goals Improve walking    Currently in Pain? Yes   multiple areas in body.   Pain Score 9    depends on the day , area varies   Pain Descriptors / Indicators Burning   terrible   Pain Type Chronic pain    Pain Onset More than a month ago    Pain Frequency Constant    Aggravating Factors  activity on feet    Pain Relieving Factors medication, sit              [facility] PT Assessment - 11/10/19 0001      Assessment   Medical Diagnosis repeated falls    Referring Provider (PT) Lavone Orn , MD    Onset Date/Surgical Date --   years ago    Next MD Visit PRN    Prior Therapy No      Precautions   Precautions Fall      Restrictions   Weight Bearing Restrictions No      Balance Screen   Has the patient  fallen in the past 6 months Yes    How many times? --   multiple   Has the patient had a decrease in activity level because of a fear of falling?  Yes    Is the patient reluctant to leave their home because of a fear of falling?  Yes      [place] residence      Prior Function   Level of [place] device for independence;Needs assistance with ADLs;Needs assistance with homemaking    Vocation Retired      Associate Professor   Overall Cognitive Status Within Functional Limits for tasks assessed      Posture/Postural Control   Posture Comments flexed posture in standing and walking      ROM / Strength   AROM / PROM / Strength Strength      Strength   Strength Assessment Site Ankle;Knee;Hip    Right/Left Hip Left;Right    Right/Left Knee Right;Left    Right/Left Ankle Right;Left      Ambulation/Gait   Gait Comments Walks with wide BOS and flexed posture with SPC decr stride length and de r stability      Standardized Balance Assessment   Standardized Balance Assessment Berg  Balance Test;Five Times Sit to Stand    Five times sit to stand comments  40 sec with Bilateral UE support with assist from PT         Berg Balance Test   Sit to Stand Needs minimal aid to stand or to stabilize    Standing Unsupported Needs several tries to stand 30 seconds unsupported    Sitting with Back Unsupported but Feet Supported on Floor or Stool Able to sit safely and securely 2 minutes    Stand to Sit Sits independently, has uncontrolled descent    Transfers Able to transfer with verbal cueing and /or supervision    Standing Unsupported with Eyes Closed Needs help to keep from falling    Standing Unsupported with Feet Together Needs help to attain position and unable to hold for 15 seconds    From Standing, Reach Forward with Outstretched Arm Reaches forward but needs supervision    From Standing Position, Pick up Object from Floor Unable to try/needs assist to keep balance    From Standing Position, Turn to Look Behind Over each Shoulder Needs supervision when turning    Turn 360 Degrees Needs close supervision or verbal cueing    Standing Unsupported, Alternately Place Feet on Step/Stool Needs assistance to keep from falling or unable to try    Standing Unsupported, One Foot in Front Needs help to step but can hold 15 seconds    Standing on One Leg Unable to try or needs assist to prevent fall    Total Score 13                      Objective measurements completed on examination: See above findings.               PT Education - 11/10/19 1551    Education Details POC , Need for walker to decr fall risk.    Person(s) Educated Patient;Spouse    Methods Explanation    Comprehension Verbalized understanding            PT Short Term Goals - 11/10/19 1545      PT SHORT TERM GOAL #1   Title He will be independent with initial HEP  Time 3    Period Weeks    Status New      PT SHORT TERM GOAL #2   Title He will improve berg balance by 3 points      Time 3    Period Weeks    Status New      PT SHORT TERM GOAL #3   Title He will be walking with RW consistently in and out of home to decr risk of fall    Time 3    Period Weeks    Status New             PT Long Term Goals - 11/10/19 1553      PT LONG TERM GOAL #2   Title BERG score improved to 35/40 to decr risk of fall    Time 8    Period Weeks    Status New      PT LONG TERM GOAL #3   Title He will be able to stand for 2 min with no UE support  with  need for supervion  only    Time 8    Period Weeks    Status New      PT LONG TERM GOAL #4   Title He will be able to rise from chair with one attempt and stabilize withut LOB    Time 8    Period Weeks    Status New                  Plan - 11/10/19 1541    Clinical Impression Statement MR Masden presents with reports of falls and decreased mobility woith neuropathy pain in UE and LE and chronic LBP. He scored a 13 on the BERG balance test and I advised pt and wife he should be using a walker for safety to limit risk of fall.   Due to neuropathy and gnificnat balance issues significant progress may be linmited but we will see if we can get him on a HEP and with skilled PT  improve general balance  and mobility for safety in and out of the home.    Personal Factors and Comorbidities Age;Fitness;Past/Current Experience;Time since onset of injury/illness/exacerbation;Comorbidity 1    Comorbidities hearing, neuropathy    Examination-Activity Limitations Bathing;Locomotion Level;Stand;Carry;Dressing    Examination-Participation Restrictions Community Activity;Driving;Yard Work;Cleaning    Stability/Clinical Decision Making Evolving/Moderate complexity    Rehab Potential Fair    PT Frequency 2x / week    PT Duration 6 weeks    PT Treatment/Interventions Passive range of motion;Manual techniques;Patient/family education;Therapeutic activities;Therapeutic exercise;Balance training;Gait training    PT Next Visit Plan  Initiate HEP for strength and balance.  Assess walking with RW  6 min walk test with RW.    Consulted and Agree with Plan of Care Patient;Family member/caregiver    Family Member Consulted wife , plan discyussed with spouse           Patient will benefit from skilled therapeutic intervention in order to improve the following deficits and impairments:  Postural dysfunction, Decreased strength, Decreased activity tolerance, Decreased balance, Difficulty walking, Decreased range of motion, Pain  Visit Diagnosis: Repeated falls  Muscle weakness (generalized)  Unsteadiness on feet  Abnormal posture  Pain in both lower extremities     Problem List Patient Active Problem List   Diagnosis Date Noted  . Tachycardia 07/10/2019  . DOE (dyspnea on exertion) 07/10/2019    Darrel Hoover  PT 11/10/2019, 3:55 PM  [facility]  [place], [place], [address] Phone: [phone]   Fax:  [phone]  Name: DEAVEON SCHOEN MRN: [id number] Date of Birth: 03/31/1940

## 2019-11-25 ENCOUNTER — Ambulatory Visit: Payer: Medicare Other

## 2019-11-25 ENCOUNTER — Other Ambulatory Visit: Payer: Self-pay

## 2019-11-25 DIAGNOSIS — R2681 Unsteadiness on feet: Secondary | ICD-10-CM

## 2019-11-25 DIAGNOSIS — M6281 Muscle weakness (generalized): Secondary | ICD-10-CM

## 2019-11-25 DIAGNOSIS — R296 Repeated falls: Secondary | ICD-10-CM

## 2019-11-25 NOTE — Therapy (Signed)
[place], [place], [address] Phone: [phone]   Fax:  [phone]  Physical Therapy Treatment  Patient Details  Name: [name] MRN: [id number] Date of Birth: 19-Feb-1940 Referring Provider (PT): Lavone Orn , MD   Encounter Date: 11/25/2019   PT End of Session - 11/25/19 1406    Visit Number 2    Number of Visits 12    Date for PT Re-Evaluation 12/19/19    Authorization Type UHC MCR    PT Start Time 0200    PT Stop Time 0240    PT Time Calculation (min) 40 min    Activity Tolerance Patient tolerated treatment well    Behavior During Therapy [facility] for tasks assessed/performed           Past Medical History:  Diagnosis Date   Adenomatous colon polyp    Allergic rhinitis    BMI 40.0-44.9, adult ([place])    Chronic insomnia    Chronic low back pain    Constipation, unspecified    Diabetes mellitus ([place]) 2015   DJD (degenerative joint disease) of knee    Hypercalcemia    Hypertension    Hypertriglyceridemia    Junctional tachycardia ([facility])    Lumbar facet arthropathy    Morbid obesity ([place])    Neuropathy    Nonallergic rhinitis    OSA (obstructive sleep apnea)    Other chronic pain    Peripheral axonal neuropathy    Upper GI bleed    due to aspirin   Venous insufficiency    Venous stasis dermatitis of both lower extremities     Past Surgical History:  Procedure Laterality Date   cataract     Bilateral   GLAUCOMA SURGERY     HAND SURGERY Left    s/p amputation of left finger    There were no vitals filed for this visit.   Subjective Assessment - 11/25/19 1406    Subjective No changes . Using walker    Currently in Pain? No/denies              [facility] PT Assessment - 11/25/19 0001      6 minute walk test results    Endurance additional comments 560   feet O2 96  HR 115.  stopped at 5 min LOB caught by Pt due to fatigue      Standardized Balance  Assessment   Five times sit to stand comments  20 sec with RW                         [facility] Adult PT Treatment/Exercise - 11/25/19 0001      Neuro Re-ed    Neuro Re-ed Details  stand at walker no UE support   3 x 30 sec       Exercises   Exercises Knee/Hip      Knee/Hip Exercises: Aerobic   Nustep L3 5 min UE andLE      Knee/Hip Exercises: Standing   Hip Flexion Right;Left;10 reps      Knee/Hip Exercises: Seated   Long Arc Quad Right;Left;20 reps    Long Arc Quad Weight 5 lbs.    Sit to [employer] 2 sets;5 reps                    PT Short Term Goals - 11/10/19 1545      PT SHORT TERM GOAL #1   Title He will  be independent with initial HEP    Time 3    Period Weeks    Status New      PT SHORT TERM GOAL #2   Title He will improve berg balance by 3 points    Time 3    Period Weeks    Status New      PT SHORT TERM GOAL #3   Title He will be walking with RW consistently in and out of home to decr risk of fall    Time 3    Period Weeks    Status New             PT Long Term Goals - 11/10/19 1553      PT LONG TERM GOAL #2   Title BERG score improved to 35/40 to decr risk of fall    Time 8    Period Weeks    Status New      PT LONG TERM GOAL #3   Title He will be able to stand for 2 min with no UE support  with  need for supervion  only    Time 8    Period Weeks    Status New      PT LONG TERM GOAL #4   Title He will be able to rise from chair with one attempt and stabilize withut LOB    Time 8    Period Weeks    Status New                 Plan - 11/25/19 1407    Clinical Impression Statement Tolerated all activity without complaint . I raised RW but cautioned pt to lean as much as needed to be balance.  Cautioned him he may be sore.   With RW he is able to work hard. Cont to work on strength and balance. discouraged him from buying a back brace  and explained negative impact of brace use.    PT Treatment/Interventions  Passive range of motion;Manual techniques;Patient/family education;Therapeutic activities;Therapeutic exercise;Balance training;Gait training    PT Next Visit Plan strength and balance.  Assess walking with RW  6 min walk test with RW.    Consulted and Agree with Plan of Care Patient;Family member/caregiver    Family Member Consulted wife , plan discyussed with spouse           Patient will benefit from skilled therapeutic intervention in order to improve the following deficits and impairments:  Postural dysfunction, Decreased strength, Decreased activity tolerance, Decreased balance, Difficulty walking, Decreased range of motion, Pain  Visit Diagnosis: Repeated falls  Muscle weakness (generalized)  Unsteadiness on feet     Problem List Patient Active Problem List   Diagnosis Date Noted   Tachycardia 07/10/2019   DOE (dyspnea on exertion) 07/10/2019    [name]  PT 11/25/2019, 2:44 PM  [place] [facility] [address] [place], [place], [address] Phone: [phone]   Fax:  [phone]  Name: [name] MRN: [id number] Date of Birth: January 08, 1941

## 2019-11-27 ENCOUNTER — Other Ambulatory Visit: Payer: Self-pay

## 2019-11-27 ENCOUNTER — Ambulatory Visit: Payer: Medicare Other

## 2019-11-27 DIAGNOSIS — R296 Repeated falls: Secondary | ICD-10-CM

## 2019-11-27 DIAGNOSIS — M6281 Muscle weakness (generalized): Secondary | ICD-10-CM

## 2019-11-27 DIAGNOSIS — R2681 Unsteadiness on feet: Secondary | ICD-10-CM

## 2019-11-27 NOTE — Therapy (Addendum)
[place], [place], [address] Phone: [phone]   Fax:  [phone]  Physical Therapy Treatment/ Discharge  Patient Details  Name: [name] MRN: [id number] Date of Birth: [birth year] Referring Provider (PT): Lavone Orn , MD   Encounter Date: 11/27/2019   PT End of Session - 11/27/19 1405    Visit Number 3    Number of Visits 12    Date for PT Re-Evaluation 12/19/19    Authorization Type UHC MCR    PT Start Time 0200    PT Stop Time 0240    PT Time Calculation (min) 40 min    Activity Tolerance Patient tolerated treatment well    Behavior During Therapy [facility] for tasks assessed/performed           Past Medical History:  Diagnosis Date  . Adenomatous colon polyp   . Allergic rhinitis   . BMI 40.0-44.9, adult ([place])   . Chronic insomnia   . Chronic low back pain   . Constipation, unspecified   . Diabetes mellitus ([place]) 2015  . DJD (degenerative joint disease) of knee   . Hypercalcemia   . Hypertension   . Hypertriglyceridemia   . 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 Lumbar facet arthropathy   . Morbid obesity ([place])   . Neuropathy   . Nonallergic rhinitis   . OSA (obstructive sleep apnea)   . Other chronic pain   . Peripheral axonal neuropathy   . Upper GI bleed    due to aspirin  . Venous insufficiency   . Venous stasis dermatitis of both lower extremities     Past Surgical History:  Procedure Laterality Date  . cataract     Bilateral  . GLAUCOMA SURGERY    . HAND SURGERY Left    s/p amputation of left finger    There were no vitals filed for this visit.   Subjective Assessment - 11/27/19 1404    Subjective No problems after last session. RW  higher is good.    Currently in Pain? No/denies                             [facility] Adult PT Treatment/Exercise - 11/27/19 0001      Neuro Re-ed    Neuro Re-ed Details  standing fet parallel and in step stand 30 sec  x 3 each      Knee/Hip Exercises: Stretches   Other Knee/Hip Stretches overhead shoulder /arm lifts  x10      Knee/Hip Exercises: Aerobic   Nustep L4  6 min UE andLE      Knee/Hip Exercises: Seated   Long Arc Quad Right;Left;20 reps    Long Arc Quad Weight 5 lbs.    Marching Right;Left;20 reps    Marching Limitations 5#    Sit to [employer] 3 sets;5 reps   last set done without UE assist     Knee/Hip Exercises: Sidelying   Hip ABduction Right;Left;15 reps    Clams RT/LT x 15             See HEP as he did all 15-20 reps RT and LT       PT Education - 11/27/19 1453    Education Details HEP    Person(s) Educated Patient    Methods Explanation;Tactile cues;Verbal cues;Handout    Comprehension Verbalized understanding;Returned demonstration            PT Short  Term Goals - 11/10/19 1545      PT SHORT TERM GOAL #1   Title He will be independent with initial HEP    Time 3    Period Weeks    Status New      PT SHORT TERM GOAL #2   Title He will improve berg balance by 3 points    Time 3    Period Weeks    Status New      PT SHORT TERM GOAL #3   Title He will be walking with RW consistently in and out of home to decr risk of fall    Time 3    Period Weeks    Status New             PT Long Term Goals - 11/10/19 1553      PT LONG TERM GOAL #2   Title BERG score improved to 35/40 to decr risk of fall    Time 8    Period Weeks    Status New      PT LONG TERM GOAL #3   Title He will be able to stand for 2 min with no UE support  with  need for supervion  only    Time 8    Period Weeks    Status New      PT LONG TERM GOAL #4   Title He will be able to rise from chair with one attempt and stabilize withut LOB    Time 8    Period Weeks    Status New                 Plan - 11/27/19 1405    Clinical Impression Statement Continue to push strength and balance as tolerated. no pain post session. Will add bands to mat exercise and do some standing.  Cont balance work    PT Treatment/Interventions Passive range of motion;Manual techniques;Patient/family education;Therapeutic activities;Therapeutic exercise;Balance training;Gait training    PT Next Visit Plan strength and balance.    PT Home Exercise Plan LAQ, seated manrch , supine march, LTR, bridge, side hip abduction and clam.    Consulted and Agree with Plan of Care Patient;Family member/caregiver    Family Member Consulted wife , plan discyussed with spouse           Patient will benefit from skilled therapeutic intervention in order to improve the following deficits and impairments:  Postural dysfunction, Decreased strength, Decreased activity tolerance, Decreased balance, Difficulty walking, Decreased range of motion, Pain  Visit Diagnosis: Repeated falls  Muscle weakness (generalized)  Unsteadiness on feet     Problem List Patient Active Problem List   Diagnosis Date Noted  . Tachycardia 07/10/2019  . DOE (dyspnea on exertion) 07/10/2019    Darrel Hoover  PT 11/27/2019, 2:55 PM  [facility] [address] [place], [place], [address] Phone: [phone]   Fax:  [phone]  Name: [name] MRN: [id number] Date of Birth: 30-Jan-1940  PHYSICAL THERAPY DISCHARGE SUMMARY  Visits from Start of Care: 3  Current functional level related to goals / functional outcomes: Canceled all appointments due to other appointments and medical issues.   Remaining deficits: Unknown    Education / Equipment: HEP Plan: Patient agrees to discharge.  Patient goals were not met. Patient is being discharged due to not returning since the last visit.  ?????   [name] PT 01/07/20

## 2019-11-27 NOTE — Patient Instructions (Signed)
seated marching and LAQ 10-20 reps 5 days .week RT /LT hold 2-5 sec Supine bridge and march x 10-20   1-5 sec hold, RT and LT  5x/week Side clam and hip abduction x 10-20,  1-5 sec hold  RT/LT , 5x/week

## 2019-12-02 ENCOUNTER — Ambulatory Visit: Payer: Medicare Other

## 2019-12-09 ENCOUNTER — Ambulatory Visit: Payer: Medicare Other

## 2019-12-11 ENCOUNTER — Ambulatory Visit: Payer: Medicare Other

## 2019-12-16 ENCOUNTER — Ambulatory Visit: Payer: Medicare Other | Admitting: Physical Therapy

## 2020-01-05 ENCOUNTER — Inpatient Hospital Stay (HOSPITAL_COMMUNITY)
Admission: EM | Admit: 2020-01-05 | Discharge: 2020-01-14 | DRG: 682 | Disposition: A | Discharge status: Skilled Nursing Facility | Payer: Medicare Other | Attending: Internal Medicine | Admitting: Internal Medicine

## 2020-01-05 DIAGNOSIS — Z886 Allergy status to analgesic agent status: Secondary | ICD-10-CM

## 2020-01-05 DIAGNOSIS — G8929 Other chronic pain: Secondary | ICD-10-CM | POA: Diagnosis present

## 2020-01-05 DIAGNOSIS — G4733 Obstructive sleep apnea (adult) (pediatric): Secondary | ICD-10-CM | POA: Diagnosis present

## 2020-01-05 DIAGNOSIS — E781 Pure hyperglyceridemia: Secondary | ICD-10-CM | POA: Diagnosis present

## 2020-01-05 DIAGNOSIS — N179 Acute kidney failure, unspecified: Principal | ICD-10-CM | POA: Diagnosis present

## 2020-01-05 DIAGNOSIS — M549 Dorsalgia, unspecified: Secondary | ICD-10-CM | POA: Diagnosis present

## 2020-01-05 DIAGNOSIS — Z79899 Other long term (current) drug therapy: Secondary | ICD-10-CM

## 2020-01-05 DIAGNOSIS — Z20822 Contact with and (suspected) exposure to covid-19: Secondary | ICD-10-CM | POA: Diagnosis present

## 2020-01-05 DIAGNOSIS — E860 Dehydration: Secondary | ICD-10-CM | POA: Diagnosis present

## 2020-01-05 DIAGNOSIS — E1169 Type 2 diabetes mellitus with other specified complication: Secondary | ICD-10-CM | POA: Diagnosis present

## 2020-01-05 DIAGNOSIS — I951 Orthostatic hypotension: Principal | ICD-10-CM | POA: Diagnosis present

## 2020-01-05 DIAGNOSIS — E119 Type 2 diabetes mellitus without complications: Secondary | ICD-10-CM | POA: Diagnosis present

## 2020-01-05 DIAGNOSIS — I480 Paroxysmal atrial fibrillation: Secondary | ICD-10-CM | POA: Diagnosis not present

## 2020-01-05 DIAGNOSIS — E876 Hypokalemia: Secondary | ICD-10-CM | POA: Diagnosis not present

## 2020-01-05 DIAGNOSIS — Z87891 Personal history of nicotine dependence: Secondary | ICD-10-CM

## 2020-01-05 DIAGNOSIS — Z8261 Family history of arthritis: Secondary | ICD-10-CM

## 2020-01-05 DIAGNOSIS — E669 Obesity, unspecified: Secondary | ICD-10-CM | POA: Diagnosis present

## 2020-01-05 DIAGNOSIS — R531 Weakness: Secondary | ICD-10-CM

## 2020-01-05 DIAGNOSIS — E785 Hyperlipidemia, unspecified: Secondary | ICD-10-CM | POA: Diagnosis present

## 2020-01-05 DIAGNOSIS — I10 Essential (primary) hypertension: Secondary | ICD-10-CM | POA: Diagnosis present

## 2020-01-05 DIAGNOSIS — Z888 Allergy status to other drugs, medicaments and biological substances status: Secondary | ICD-10-CM

## 2020-01-05 DIAGNOSIS — Z6835 Body mass index (BMI) 35.0-35.9, adult: Secondary | ICD-10-CM

## 2020-01-05 DIAGNOSIS — R17 Unspecified jaundice: Secondary | ICD-10-CM

## 2020-01-05 DIAGNOSIS — G928 Other toxic encephalopathy: Secondary | ICD-10-CM | POA: Diagnosis not present

## 2020-01-05 DIAGNOSIS — E1140 Type 2 diabetes mellitus with diabetic neuropathy, unspecified: Secondary | ICD-10-CM | POA: Diagnosis present

## 2020-01-05 DIAGNOSIS — R296 Repeated falls: Secondary | ICD-10-CM | POA: Diagnosis present

## 2020-01-05 DIAGNOSIS — H409 Unspecified glaucoma: Secondary | ICD-10-CM | POA: Diagnosis present

## 2020-01-05 LAB — BASIC METABOLIC PANEL
Anion gap: 10 (ref 5–15)
BUN: 23 mg/dL (ref 8–23)
CO2: 23 mmol/L (ref 22–32)
Calcium: 9.1 mg/dL (ref 8.9–10.3)
Chloride: 106 mmol/L (ref 98–111)
Creatinine, Ser: 1.81 mg/dL — ABNORMAL HIGH (ref 0.61–1.24)
GFR, Estimated: 37 mL/min — ABNORMAL LOW (ref >60)
Glucose, Bld: 191 mg/dL — ABNORMAL HIGH (ref 70–99)
Potassium: 3.5 mmol/L (ref 3.5–5.1)
Sodium: 139 mmol/L (ref 135–145)

## 2020-01-05 LAB — CBC
HCT: 42.6 % (ref 39.0–52.0)
Hemoglobin: 13.5 g/dL (ref 13.0–17.0)
MCH: 30.4 pg (ref 26.0–34.0)
MCHC: 31.7 g/dL (ref 30.0–36.0)
MCV: 95.9 fL (ref 80.0–100.0)
Platelets: 271 10*3/uL (ref 150–400)
RBC: 4.44 MIL/uL (ref 4.22–5.81)
RDW: 14.5 % (ref 11.5–15.5)
WBC: 9.1 10*3/uL (ref 4.0–10.5)
nRBC: 0 % (ref 0.0–0.2)

## 2020-01-05 LAB — CBG MONITORING, ED: Glucose-Capillary: 203 mg/dL — ABNORMAL HIGH (ref 70–99)

## 2020-01-05 NOTE — ED Triage Notes (Signed)
Pt presents to ED BIB [facility]. Pt c/o weakness x3d. Pt AAO x4. Denies any other s/s.

## 2020-01-06 ENCOUNTER — Emergency Department (HOSPITAL_COMMUNITY): Payer: Medicare Other

## 2020-01-06 ENCOUNTER — Other Ambulatory Visit: Payer: Self-pay

## 2020-01-06 DIAGNOSIS — E860 Dehydration: Secondary | ICD-10-CM | POA: Diagnosis present

## 2020-01-06 DIAGNOSIS — E1169 Type 2 diabetes mellitus with other specified complication: Secondary | ICD-10-CM | POA: Diagnosis present

## 2020-01-06 DIAGNOSIS — R531 Weakness: Secondary | ICD-10-CM

## 2020-01-06 DIAGNOSIS — I951 Orthostatic hypotension: Secondary | ICD-10-CM | POA: Diagnosis not present

## 2020-01-06 DIAGNOSIS — N179 Acute kidney failure, unspecified: Secondary | ICD-10-CM | POA: Diagnosis not present

## 2020-01-06 DIAGNOSIS — E669 Obesity, unspecified: Secondary | ICD-10-CM | POA: Diagnosis present

## 2020-01-06 DIAGNOSIS — H409 Unspecified glaucoma: Secondary | ICD-10-CM | POA: Diagnosis present

## 2020-01-06 LAB — RESP PANEL BY RT-PCR (FLU A&B, COVID) ARPGX2
Influenza A by PCR: NEGATIVE
Influenza B by PCR: NEGATIVE
SARS Coronavirus 2 by RT PCR: NEGATIVE

## 2020-01-06 LAB — HEMOGLOBIN A1C
Hgb A1c MFr Bld: 6.9 % — ABNORMAL HIGH (ref 4.8–5.6)
Mean Plasma Glucose: 151.33 mg/dL

## 2020-01-06 LAB — URINALYSIS, ROUTINE W REFLEX MICROSCOPIC
Bilirubin Urine: NEGATIVE
Glucose, UA: NEGATIVE mg/dL
Hgb urine dipstick: NEGATIVE
Ketones, ur: NEGATIVE mg/dL
Leukocytes,Ua: NEGATIVE
Nitrite: NEGATIVE
Protein, ur: NEGATIVE mg/dL
Specific Gravity, Urine: 1.017 (ref 1.005–1.030)
pH: 5 (ref 5.0–8.0)

## 2020-01-06 LAB — TROPONIN I (HIGH SENSITIVITY): Troponin I (High Sensitivity): 11 ng/L (ref <18)

## 2020-01-06 LAB — CK: Total CK: 154 U/L (ref 49–397)

## 2020-01-06 LAB — SEDIMENTATION RATE: Sed Rate: 33 mm/hr — ABNORMAL HIGH (ref 0–16)

## 2020-01-06 LAB — GLUCOSE, CAPILLARY: Glucose-Capillary: 158 mg/dL — ABNORMAL HIGH (ref 70–99)

## 2020-01-06 IMAGING — MR MR HEAD W/O CM
12 of 13 series · 44 of 48 positions shown · non-contrast
Comparison: CT head [DATE]

CLINICAL DATA: Stroke.  Weakness.

EXAM:
MRI HEAD WITHOUT CONTRAST
TECHNIQUE: Multiplanar, multiecho pulse sequences of the brain and surrounding
structures were obtained without intravenous contrast.

[Series 5: ax dwi_tracew · axial · 3.0mm · 0.88mm/px · z∈[-49,+96]mm · 8 of 104 slices shown]
[im 1/104]
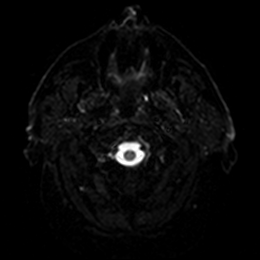
[im 15/104]
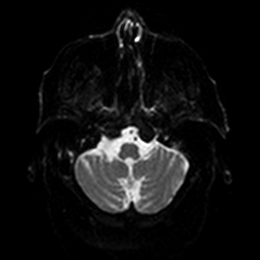
[im 30/104]
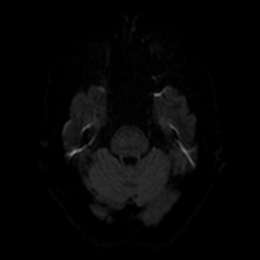
[im 45/104]
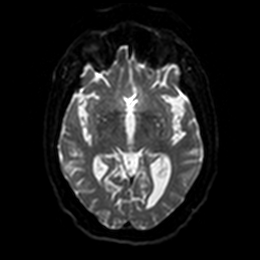
[im 59/104]
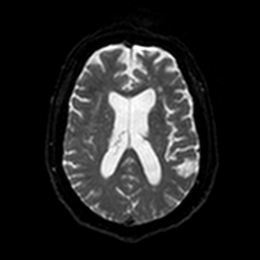
[im 74/104]
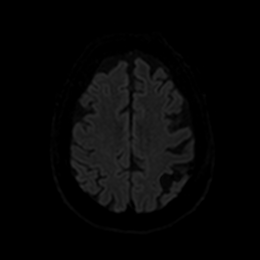
[im 89/104]
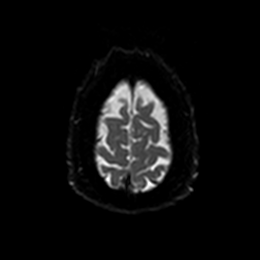
[im 104/104]
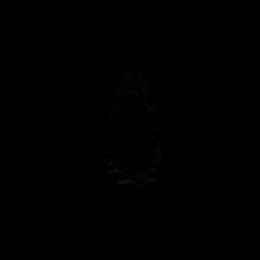

[Series 6: ax dwi_adc · axial · 3.0mm · 0.88mm/px · z∈[-49,+96]mm · 4 of 52 slices shown]
[im 1/52]
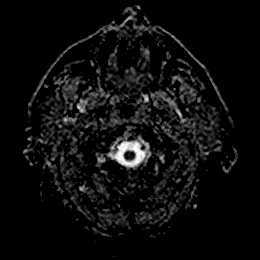
[im 18/52]
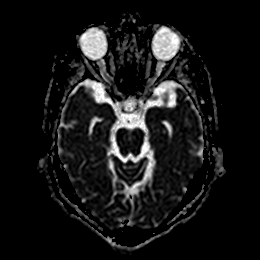
[im 35/52]
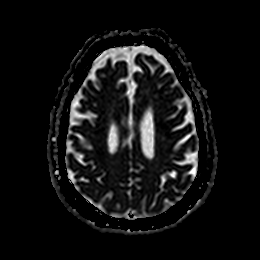
[im 52/52]
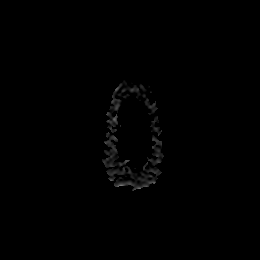

[Series 7: DWI · coronal · 4.0mm · 0.88mm/px · 5 of 76 slices shown (1 of 2)]
[im 1/76]
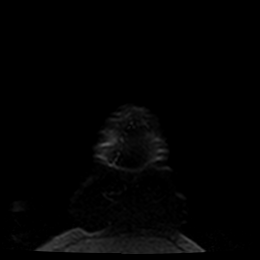
[im 19/76]
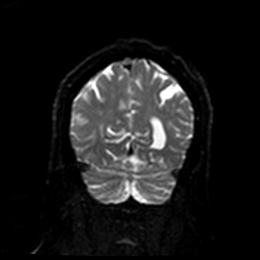
[im 38/76]
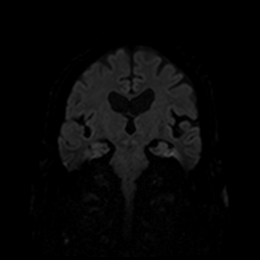
[im 57/76]
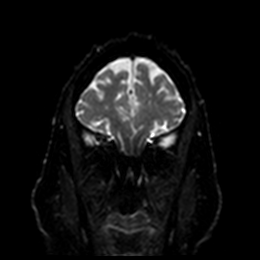
[im 76/76]
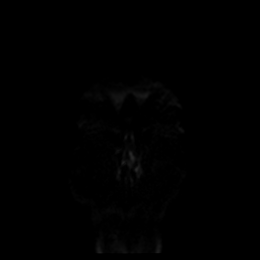

[Series 8: DWI · coronal · 4.0mm · 0.88mm/px · 3 of 38 slices shown (2 of 2)]
[im 1/38]
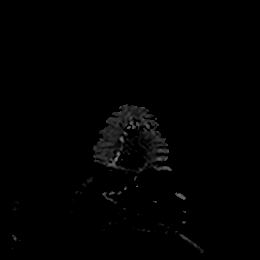
[im 19/38]
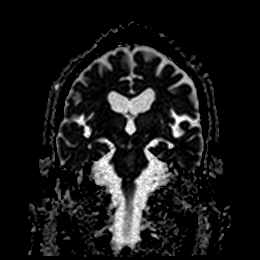
[im 38/38]
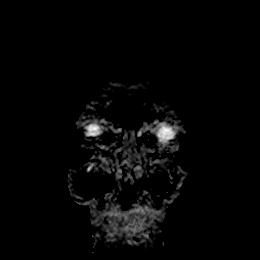

[Series 9: T1 · sagittal · 5.0mm · 0.78mm/px · 2 of 23 slices shown]
[im 1/23]
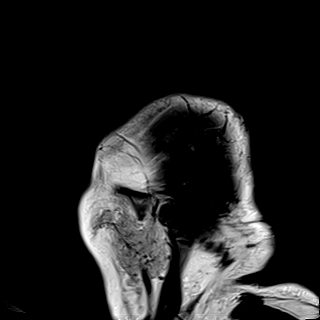
[im 23/23]
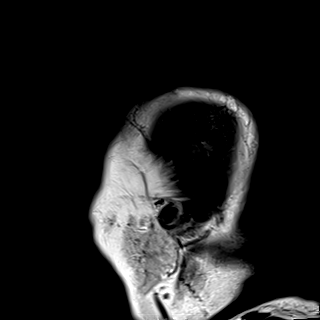

[Series 10: T2 · axial · 5.0mm · 0.72mm/px · z∈[-50,+86]mm · 2 of 25 slices shown (1 of 2)]
[im 1/25]
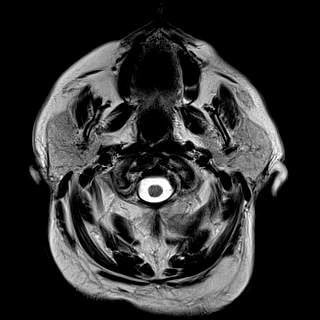
[im 25/25]
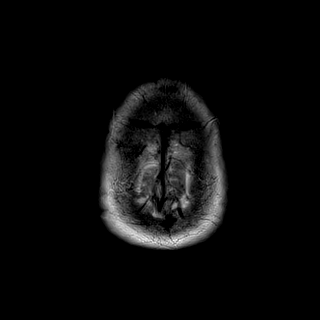

[Series 11: FLAIR · axial · 5.0mm · 0.45mm/px · z∈[-49,+87]mm · 2 of 25 slices shown]
[im 1/25]
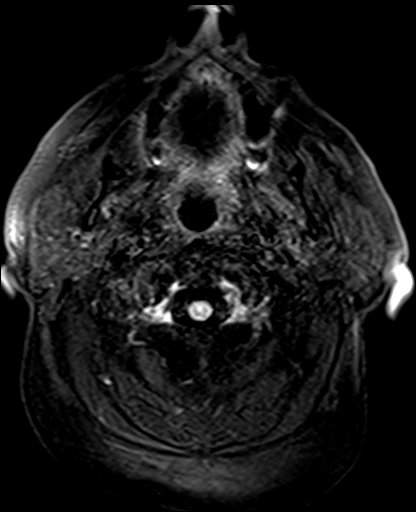
[im 25/25]
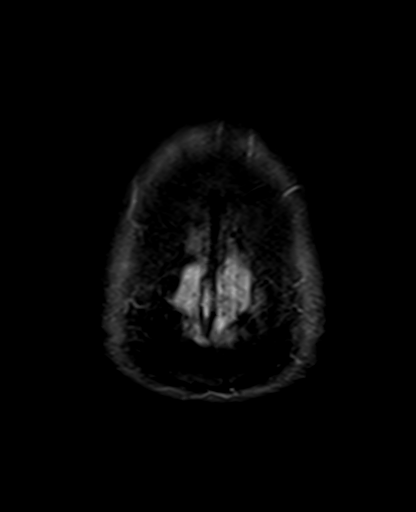

[Series 12: mag_images · axial · 3.0mm · 0.90mm/px · z∈[-55,+112]mm · 4 of 60 slices shown]
[im 1/60]
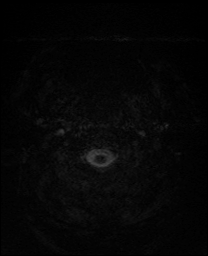
[im 20/60]
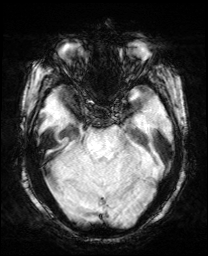
[im 40/60]
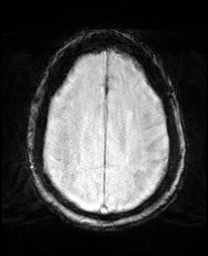
[im 60/60]
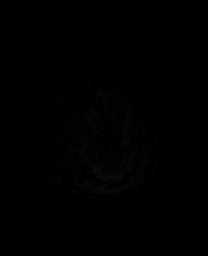

[Series 13: pha_images · axial · 3.0mm · 0.90mm/px · z∈[-55,+112]mm · 4 of 60 slices shown]
[im 1/60]
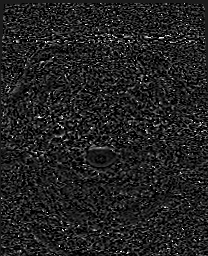
[im 20/60]
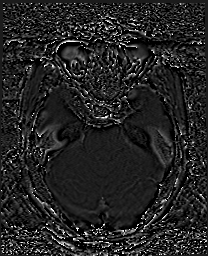
[im 40/60]
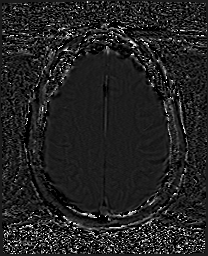
[im 60/60]
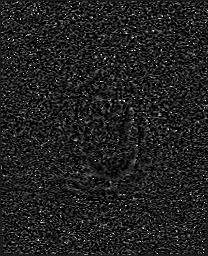

[Series 14: swi_images · axial · 3.0mm · 0.90mm/px · z∈[-55,+112]mm · 4 of 60 slices shown]
[im 1/60]
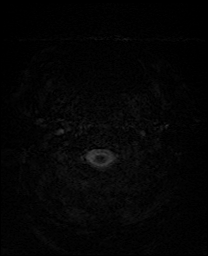
[im 20/60]
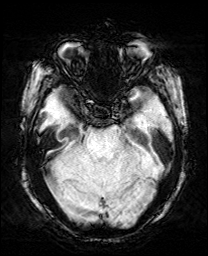
[im 40/60]
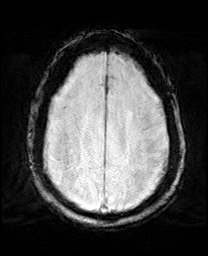
[im 60/60]
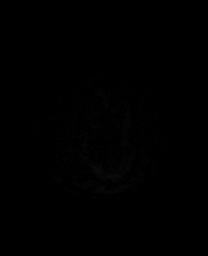

[Series 15: mip_images(sw) · axial · 24.0mm · 0.90mm/px · z∈[-45,+102]mm · 4 of 53 slices shown]
[im 1/53]
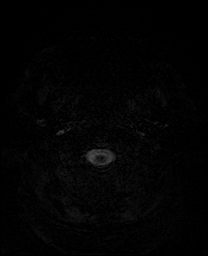
[im 18/53]
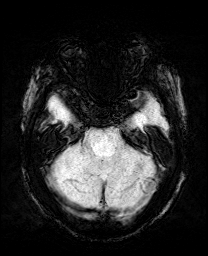
[im 35/53]
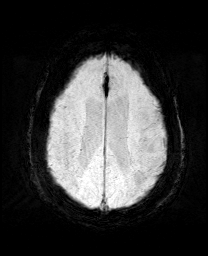
[im 53/53]
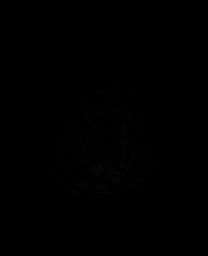

[Series 17: T2 · coronal · 5.0mm · 0.34mm/px · 2 of 29 slices shown (2 of 2)]
[im 1/29]
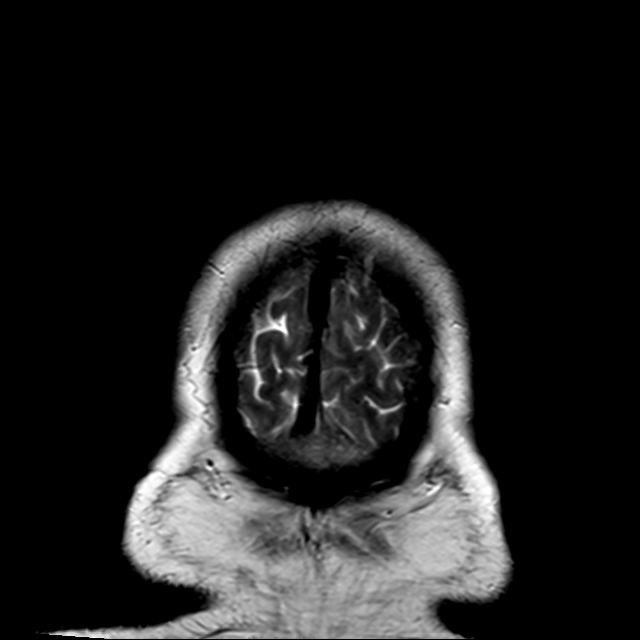
[im 29/29]
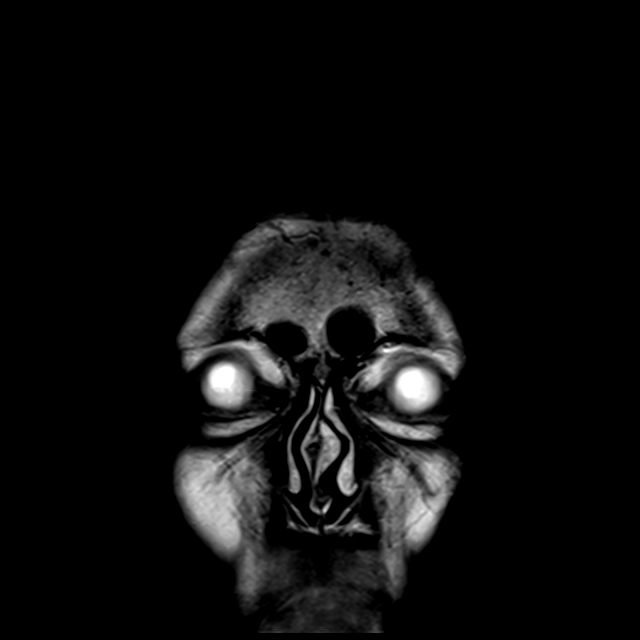

[44 of 48 positions shown; findings below may reference images not displayed]

FINDINGS: Brain: Negative for acute infarct.

Mild atrophy without hydrocephalus. Patchy hyperintensity in the
pons bilaterally right greater than left consistent with chronic
ischemia. Mild chronic white matter changes. Small chronic infarct
left frontal lobe. Negative for hemorrhage or mass.

Vascular: Normal arterial flow voids.

Skull and upper cervical spine: Negative

Sinuses/Orbits: Paranasal sinuses clear. Small right mastoid
effusion. Bilateral cataract extraction

Other: None
IMPRESSION: Negative for acute infarct.

Atrophy and chronic ischemic change in the white matter and pons.

## 2020-01-06 IMAGING — CT CT HEAD W/O CM
4 series · 16 of 47 positions shown, 18 images · non-contrast
Comparison: None.

CLINICAL DATA: 79-year-old male with weakness x3 days.

EXAM:
CT HEAD WITHOUT CONTRAST
TECHNIQUE: Contiguous axial images were obtained from the base of the skull
through the vertex without intravenous contrast.

[Series 3: head without · axial · non-contrast · 0.43mm/px · z∈[-160,-30]mm · 7 of 36 slices shown, 9 images]
[im 5/36  brain]
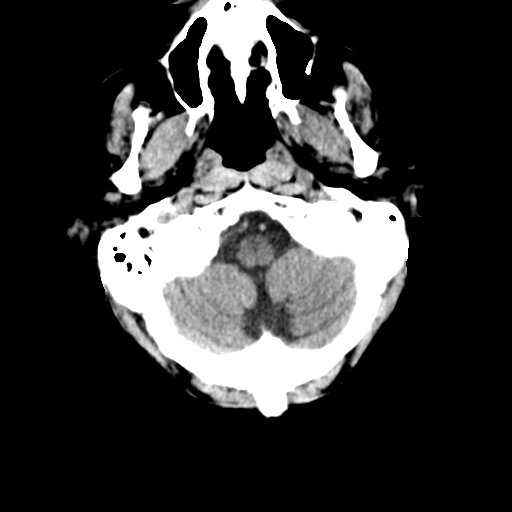
[im 5/36  bone]
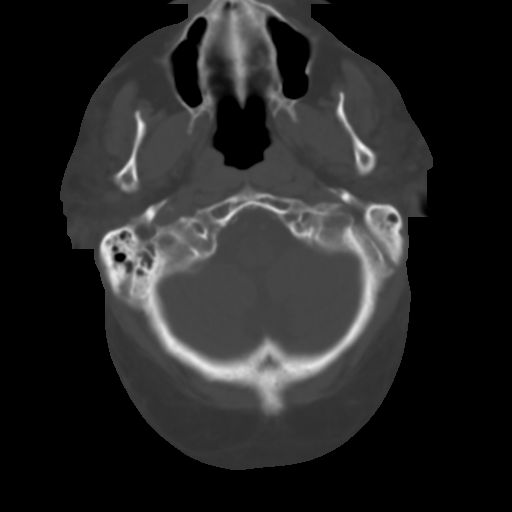
[im 9/36  brain]
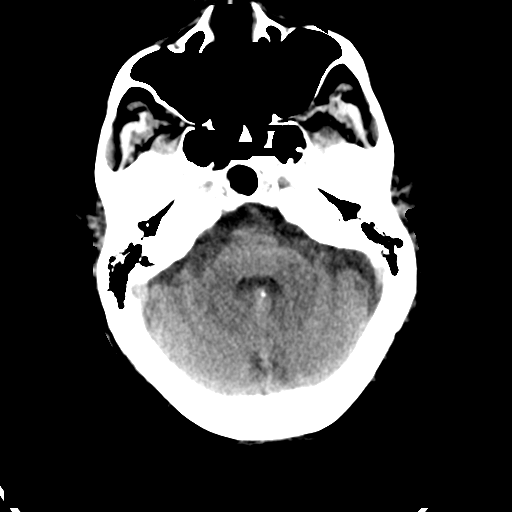
[im 14/36  brain]
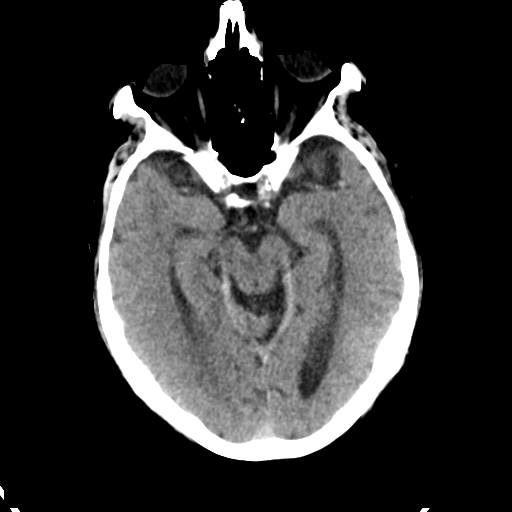
[im 18/36  brain]
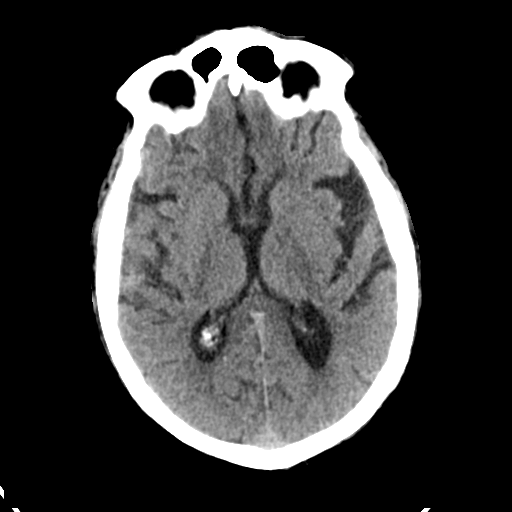
[im 22/36  brain]
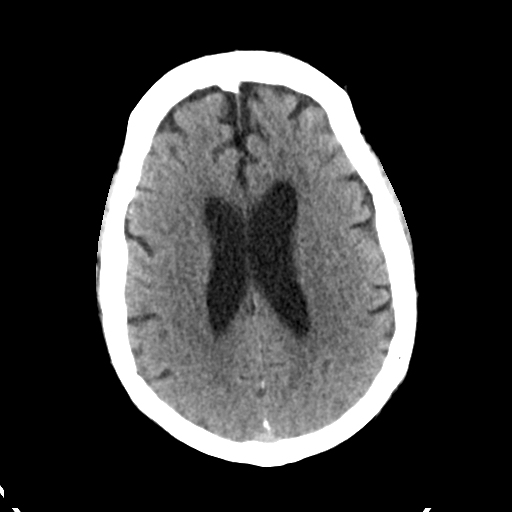
[im 22/36  bone]
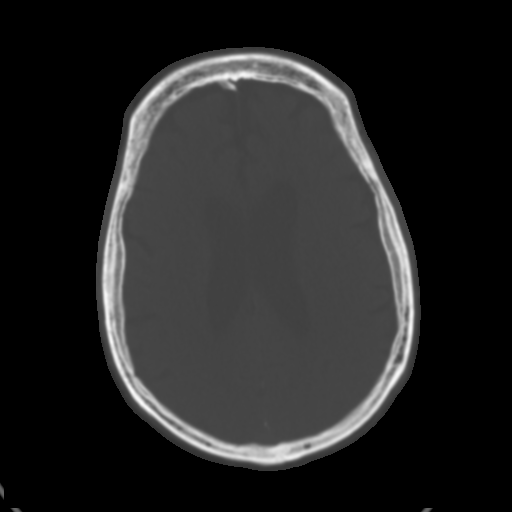
[im 27/36  brain]
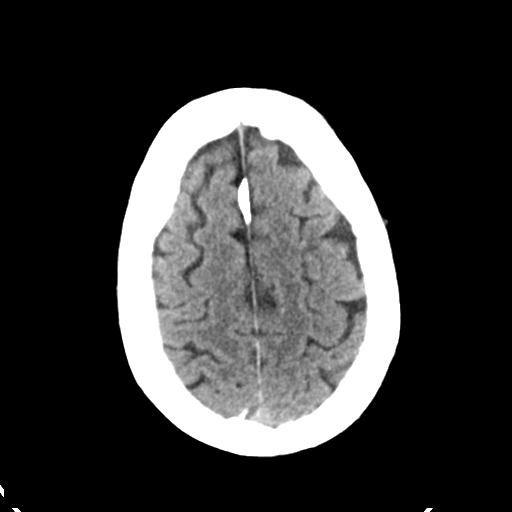
[im 31/36  brain]
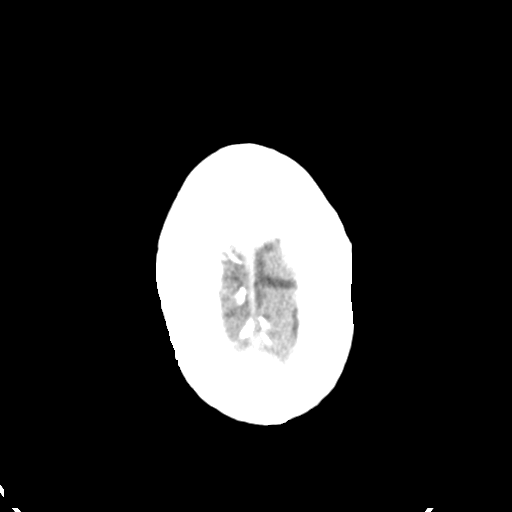

[Series 4: head bone · axial · 0.43mm/px · z∈[-164,-128]mm · 3 of 90 slices shown]
[im 9/90  bone]
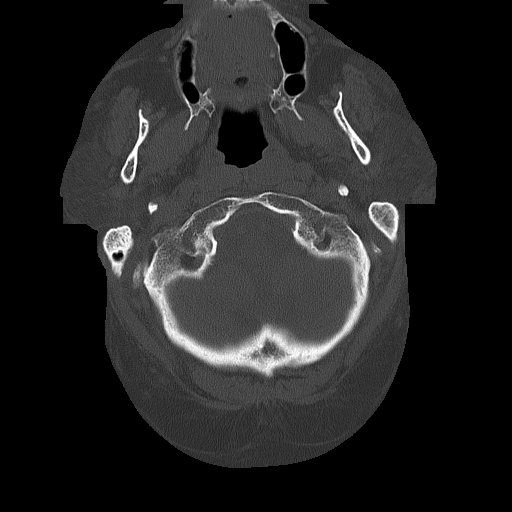
[im 18/90  bone]
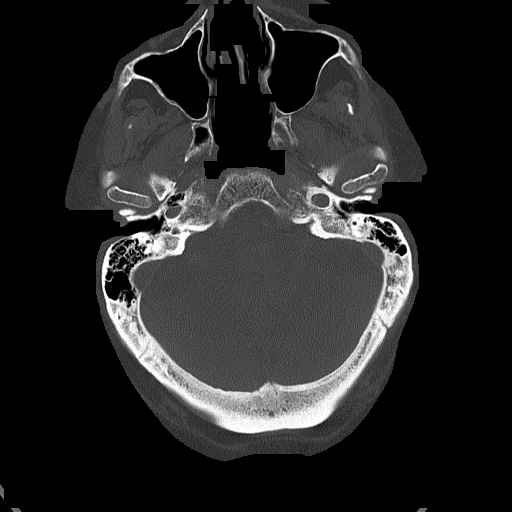
[im 27/90  bone]
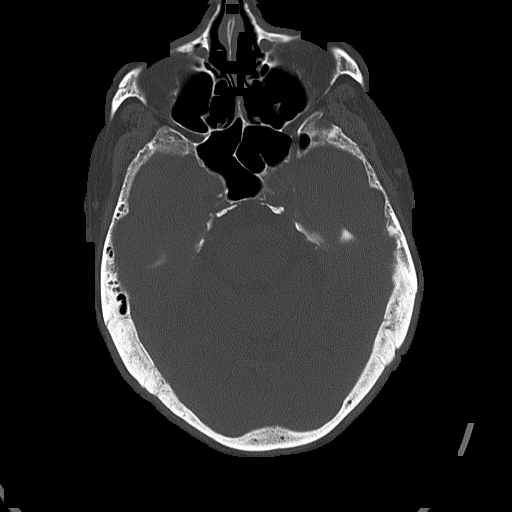

[Series 5: head without cor · coronal · non-contrast · 0.32mm/px · 3 of 71 slices shown]
[im 24/71  brain]
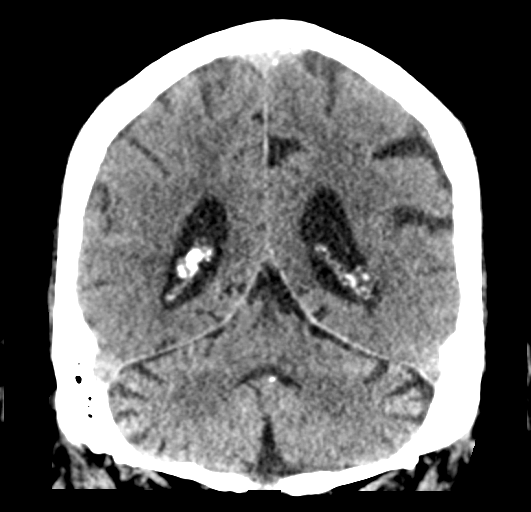
[im 32/71  brain]
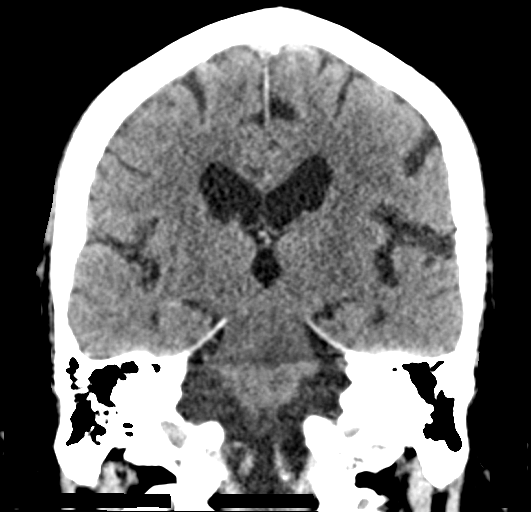
[im 39/71  brain]
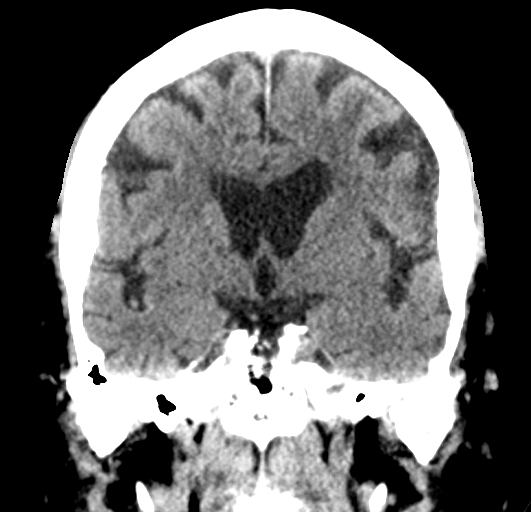

[Series 6: head without sag · sagittal · non-contrast · 0.33mm/px · 3 of 64 slices shown]
[im 22/64  brain]
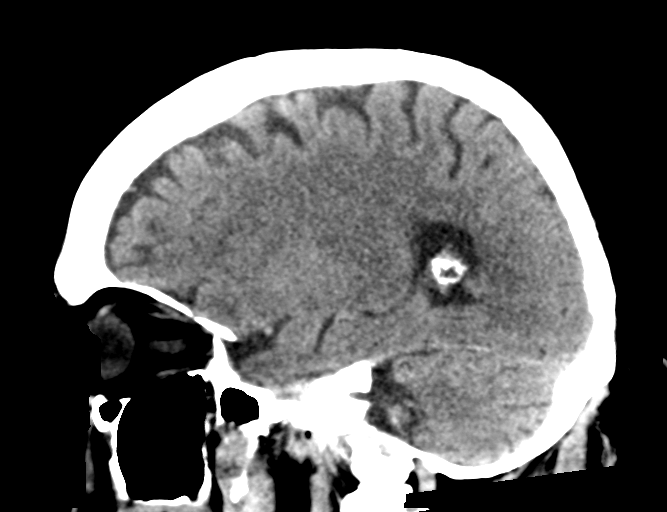
[im 32/64  brain]
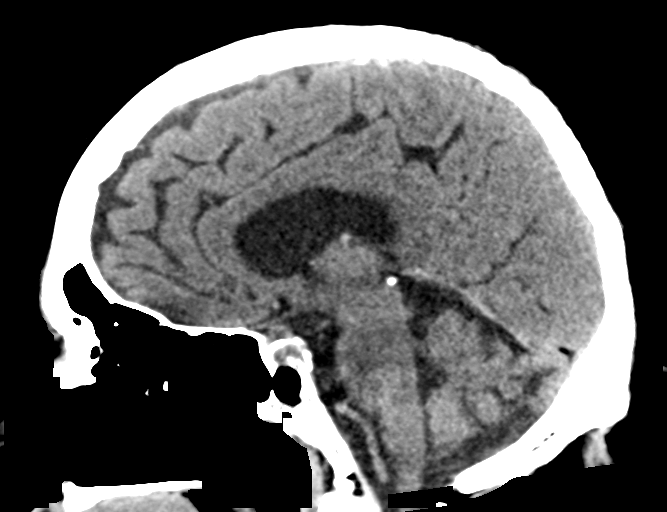
[im 43/64  brain]
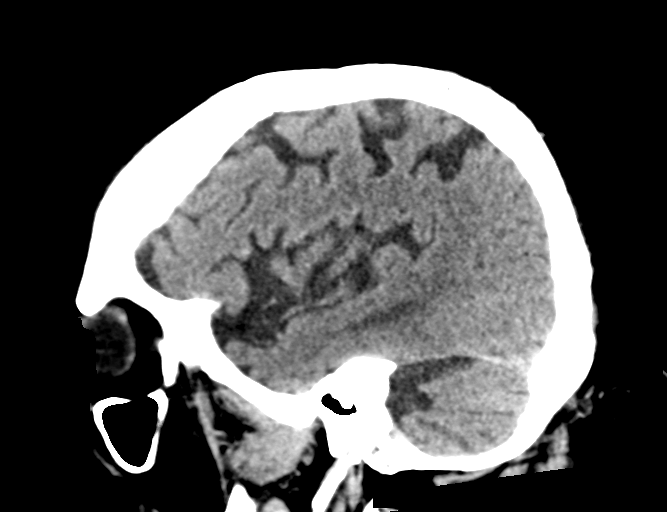

[16 of 47 positions shown; findings below may reference images not displayed]

FINDINGS: Brain: Cerebral volume is within normal limits for age. No midline
shift, ventriculomegaly, mass effect, evidence of mass lesion,
intracranial hemorrhage or evidence of cortically based acute
infarction. Subtle hypodensity in the left pons (series 3, image
11), might be artifact. Elsewhere deep gray nuclei, brainstem and
cerebellum are within normal limits.

Mild for age patchy bilateral white matter hypodensity. Chronic
appearing left parietal lobe cortical encephalomalacia on series 3,
image 24.

Vascular: Calcified atherosclerosis at the skull base. No suspicious
intracranial vascular hyperdensity.

Skull: No acute osseous abnormality identified.

Sinuses/Orbits: Visualized paranasal sinuses and mastoids are clear.

Other: No acute orbit or scalp soft tissue finding. Postoperative
changes to both globes.
IMPRESSION: 1. Suspicion of age indeterminate small vessel ischemia of the left
Pons. Query right side weakness.

2. Otherwise there is a small chronic infarct in the left parietal
lobe, mild for age bilateral white matter changes.

3. No acute intracranial hemorrhage or acute cortically based
infarct identified.

## 2020-01-06 IMAGING — CR DG CHEST 2V
2 series · 2 of 2 positions shown · non-contrast
Comparison: Chest radiograph [DATE] and earlier.

CLINICAL DATA: 79-year-old male with weakness for 3 days.

EXAM:
CHEST - 2 VIEW

[chest lat]
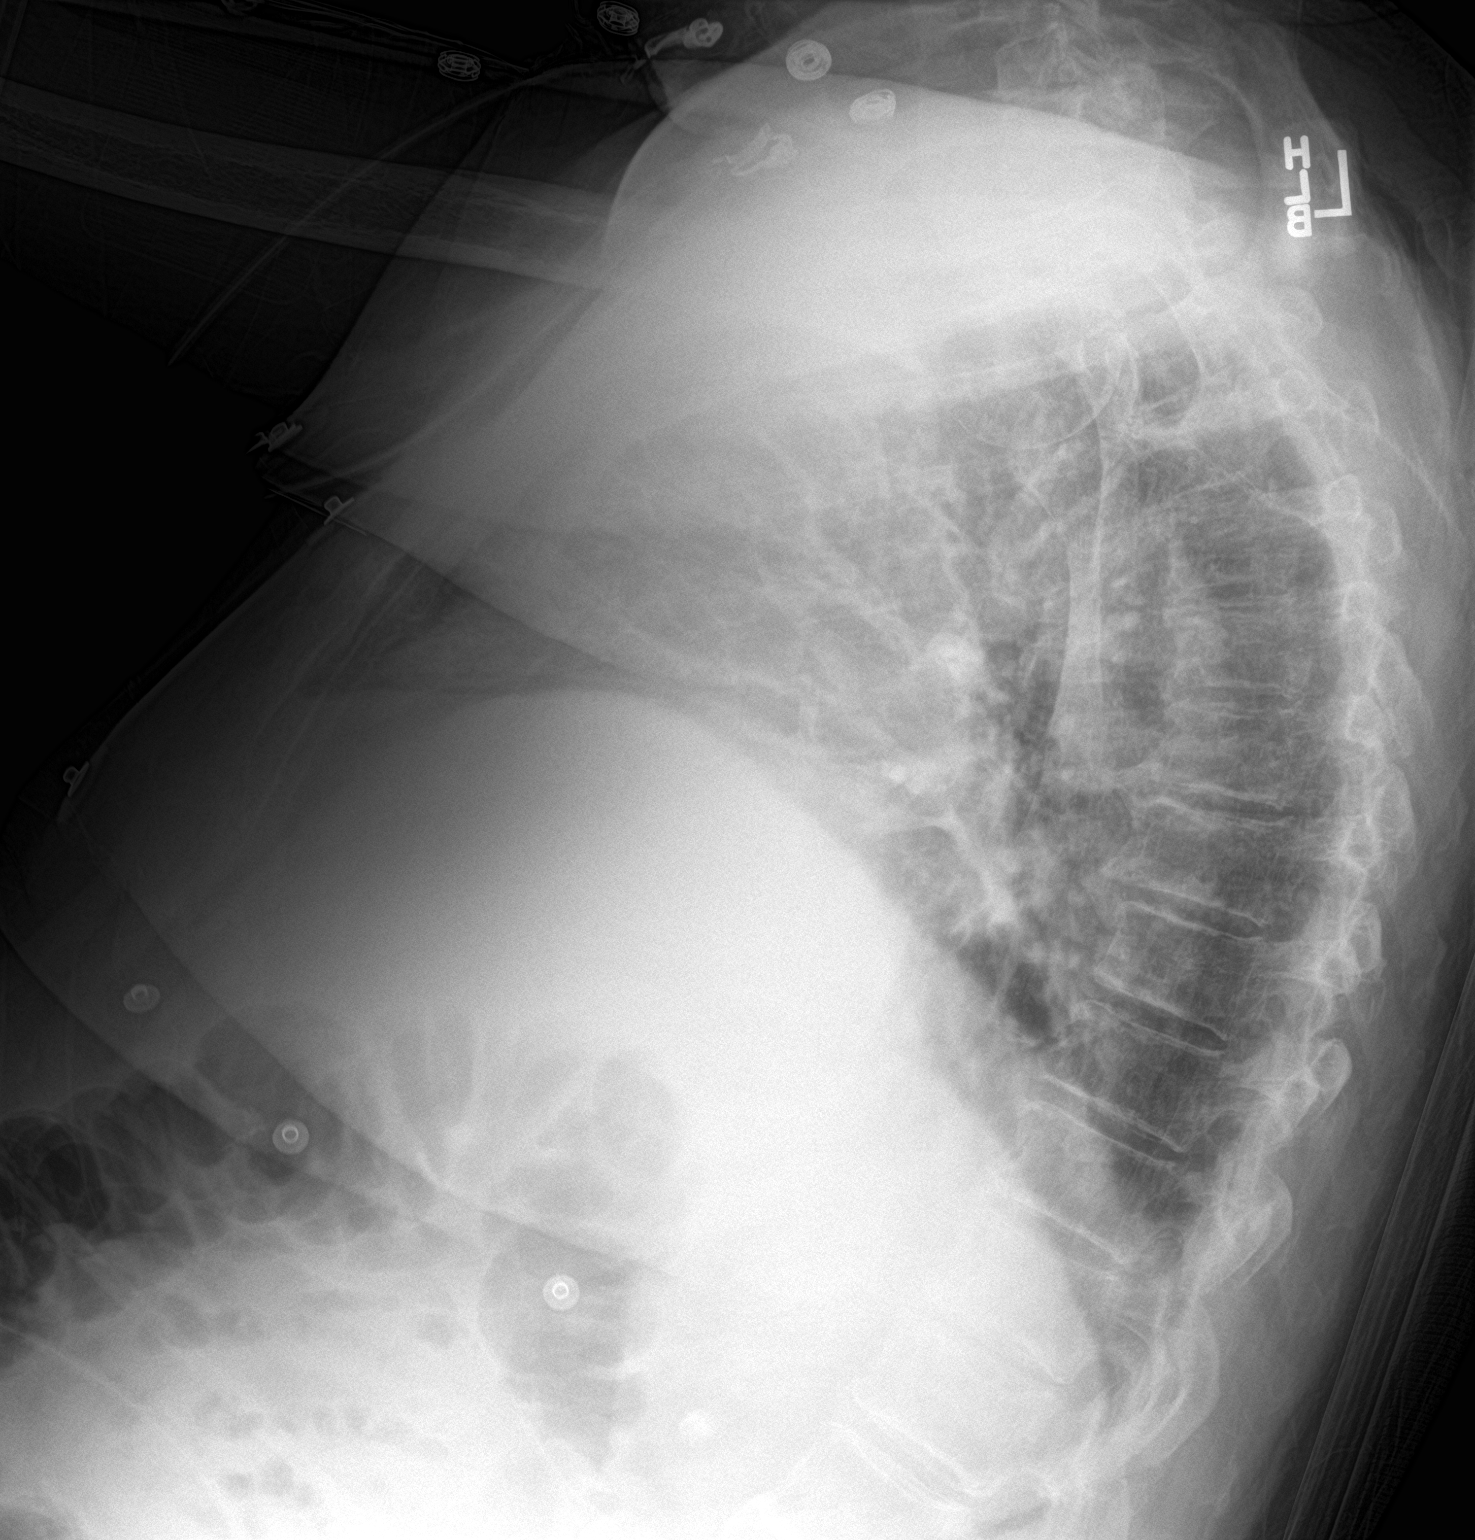

[chest ap]
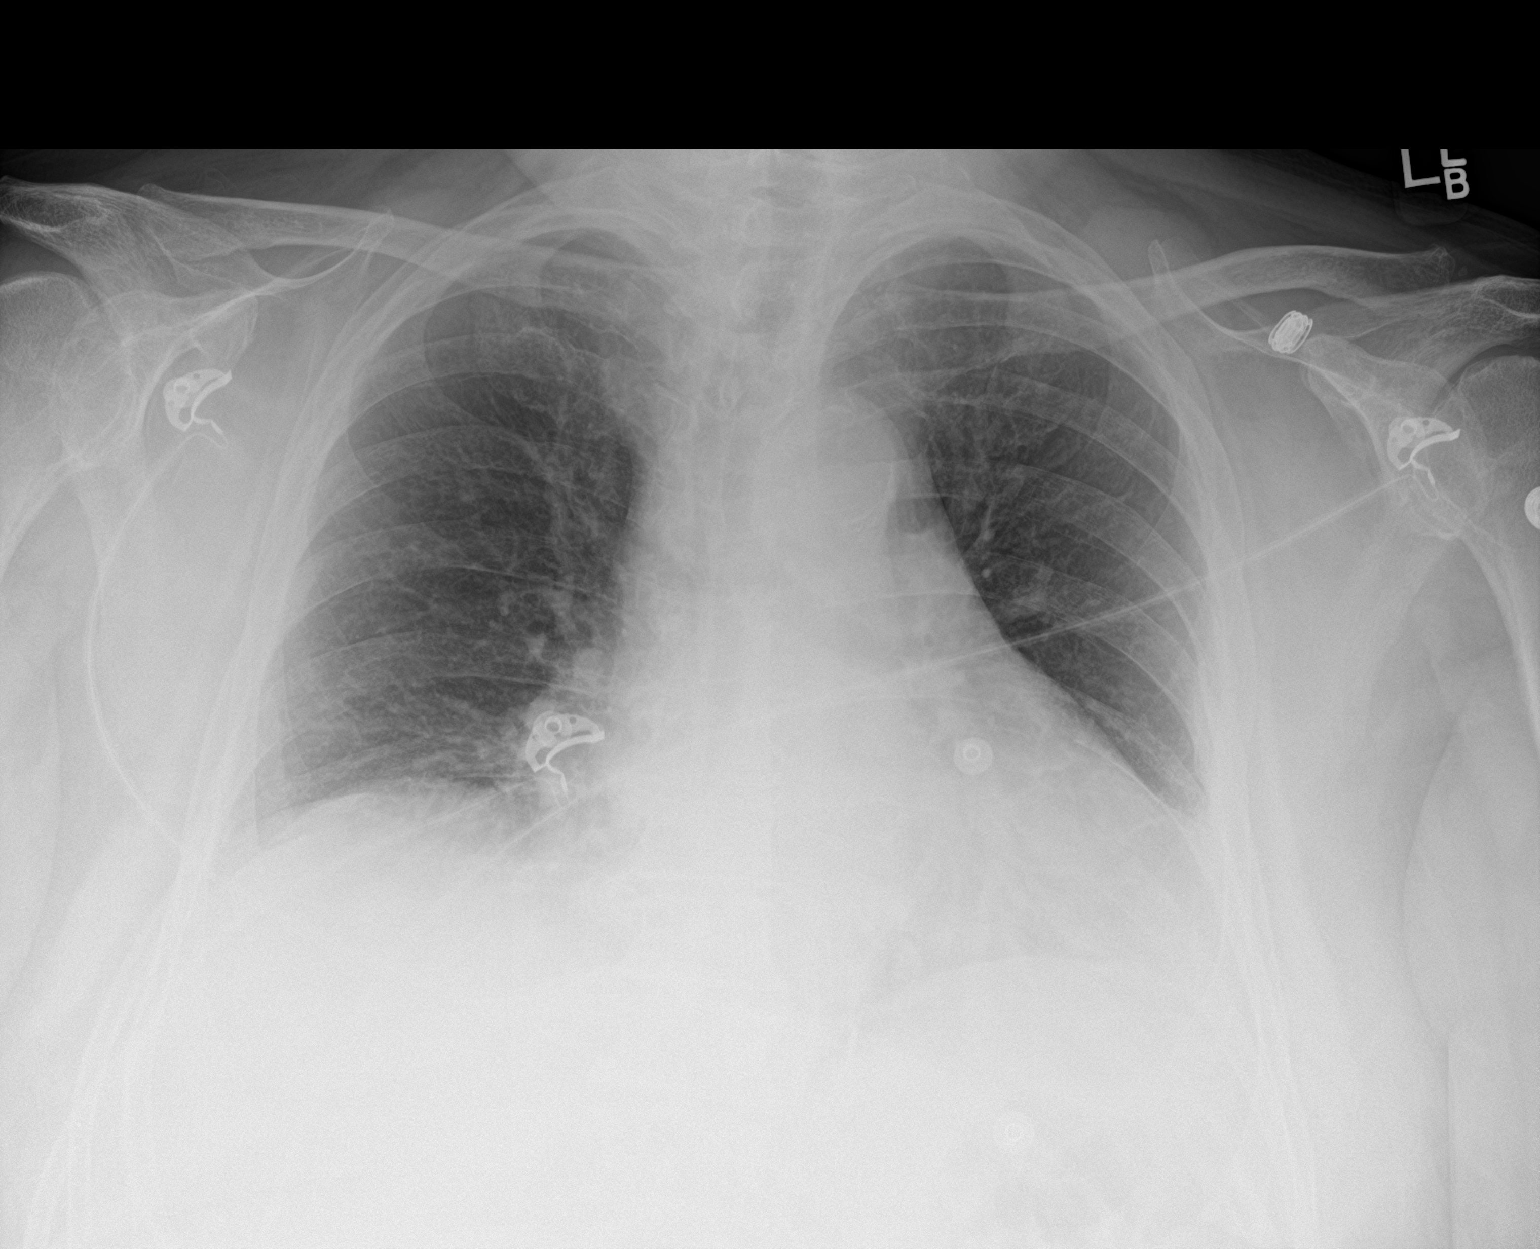

[2 of 2 positions shown; findings below may reference images not displayed]

FINDINGS: Lower lung volumes today. Mild respiratory motion on the lateral
today. Mediastinal contours remain within normal limits. No
pneumothorax, pulmonary edema, pleural effusion or confluent
pulmonary opacity. Visualized tracheal air column is within normal
limits.

No acute osseous abnormality identified. Negative visible bowel gas
pattern. Stable probable vascular calcifications in the upper
abdomen.
IMPRESSION: Low lung volumes.  No acute cardiopulmonary abnormality.

## 2020-01-06 MED ORDER — SODIUM CHLORIDE 0.9 % IV BOLUS
500.0000 mL | Freq: Once | INTRAVENOUS | Status: AC
  Administered 2020-01-06: 12:00:00 500 mL via INTRAVENOUS

## 2020-01-06 MED ORDER — DULOXETINE HCL 60 MG PO CPEP
60.0000 mg | ORAL_CAPSULE | Freq: Every day | ORAL | Status: DC
  Administered 2020-01-06 – 2020-01-14 (×9): 60 mg via ORAL
  Filled 2020-01-06 (×10): qty 1

## 2020-01-06 MED ORDER — ACETAMINOPHEN 325 MG PO TABS
650.0000 mg | ORAL_TABLET | Freq: Four times a day (QID) | ORAL | Status: DC | PRN
  Administered 2020-01-06 – 2020-01-13 (×2): 650 mg via ORAL
  Filled 2020-01-06 (×2): qty 2

## 2020-01-06 MED ORDER — INSULIN ASPART 100 UNIT/ML ~~LOC~~ SOLN: 0.0000 [IU] | Freq: Every day | SUBCUTANEOUS | Status: DC

## 2020-01-06 MED ORDER — SODIUM CHLORIDE 0.9 % IV BOLUS
500.0000 mL | Freq: Once | INTRAVENOUS | Status: AC
  Administered 2020-01-06: 08:00:00 500 mL via INTRAVENOUS

## 2020-01-06 MED ORDER — ONDANSETRON HCL 4 MG/2ML IJ SOLN: 4.0000 mg | Freq: Four times a day (QID) | INTRAMUSCULAR | Status: DC | PRN

## 2020-01-06 MED ORDER — ACETAMINOPHEN 650 MG RE SUPP: 650.0000 mg | Freq: Four times a day (QID) | RECTAL | Status: DC | PRN

## 2020-01-06 MED ORDER — TIMOLOL MALEATE 0.5 % OP SOLN
1.0000 [drp] | Freq: Two times a day (BID) | OPHTHALMIC | Status: DC
  Administered 2020-01-06 – 2020-01-14 (×16): 1 [drp] via OPHTHALMIC
  Filled 2020-01-06: qty 5

## 2020-01-06 MED ORDER — INSULIN ASPART 100 UNIT/ML ~~LOC~~ SOLN
0.0000 [IU] | Freq: Three times a day (TID) | SUBCUTANEOUS | Status: DC
  Administered 2020-01-07 – 2020-01-09 (×5): 2 [IU] via SUBCUTANEOUS
  Administered 2020-01-10: 3 [IU] via SUBCUTANEOUS
  Administered 2020-01-11: 2 [IU] via SUBCUTANEOUS
  Administered 2020-01-11: 3 [IU] via SUBCUTANEOUS
  Administered 2020-01-13: 2 [IU] via SUBCUTANEOUS

## 2020-01-06 MED ORDER — SODIUM CHLORIDE 0.9 % IV SOLN: INTRAVENOUS | Status: DC

## 2020-01-06 MED ORDER — NETARSUDIL-LATANOPROST 0.02-0.005 % OP SOLN: 1.0000 [drp] | Freq: Every day | OPHTHALMIC | Status: DC

## 2020-01-06 MED ORDER — ALBUTEROL SULFATE (2.5 MG/3ML) 0.083% IN NEBU: 2.5000 mg | INHALATION_SOLUTION | Freq: Four times a day (QID) | RESPIRATORY_TRACT | Status: DC | PRN

## 2020-01-06 MED ORDER — ENOXAPARIN SODIUM 40 MG/0.4ML ~~LOC~~ SOLN
40.0000 mg | SUBCUTANEOUS | Status: DC
  Administered 2020-01-06 – 2020-01-12 (×7): 40 mg via SUBCUTANEOUS
  Filled 2020-01-06 (×7): qty 0.4

## 2020-01-06 MED ORDER — ONDANSETRON HCL 4 MG PO TABS: 4.0000 mg | ORAL_TABLET | Freq: Four times a day (QID) | ORAL | Status: DC | PRN

## 2020-01-06 MED ORDER — LATANOPROST 0.005 % OP SOLN
1.0000 [drp] | Freq: Every day | OPHTHALMIC | Status: DC
  Administered 2020-01-06 – 2020-01-09 (×4): 1 [drp] via OPHTHALMIC
  Filled 2020-01-06: qty 2.5

## 2020-01-06 MED ORDER — SODIUM CHLORIDE 0.9% FLUSH
3.0000 mL | Freq: Two times a day (BID) | INTRAVENOUS | Status: DC
  Administered 2020-01-06 – 2020-01-10 (×7): 3 mL via INTRAVENOUS

## 2020-01-06 MED ORDER — HYDROCODONE-ACETAMINOPHEN 5-325 MG PO TABS
1.0000 | ORAL_TABLET | ORAL | Status: DC | PRN
  Administered 2020-01-07 – 2020-01-14 (×12): 1 via ORAL
  Filled 2020-01-06 (×12): qty 1

## 2020-01-06 MED ORDER — BRIMONIDINE TARTRATE 0.2 % OP SOLN
1.0000 [drp] | Freq: Two times a day (BID) | OPHTHALMIC | Status: DC
  Administered 2020-01-06 – 2020-01-10 (×8): 1 [drp] via OPHTHALMIC
  Filled 2020-01-06: qty 5

## 2020-01-06 NOTE — ED Notes (Signed)
Patient transported to CT 

## 2020-01-06 NOTE — H&P (Addendum)
History and Physical    [name] [id number] DOB: 06/16/1940 DOA: 01/05/2020  Referring MD/NP/PA: Soledad Gerlach, PAS PCP: Lavone Orn, MD  Patient coming from: Home with son and wife via EMS  Chief Complaint: Weakness   I have personally briefly reviewed patient's old medical records in [place]   HPI: [name] is a 79 y.o. male with medical history significant of hypertension, dyslipidemia, diabetes mellitus type 2, chronic low back pain, glaucoma, and obesity presents with complaints of weakness over the last 3 days.  At baseline patient utilizes a walker to get around.  He normally backs his walker into his bedroom to sleep at night.  His wife reported that 4 days ago he seemed to be dragging his feet some.  Following day patient fell while in the bathroom.  His wife uses a cane and was unable to help him get up.  One of the patient's son who lives with them and the other who does not had to help get him up.  Patient reportedly fell again later on that day, but they were able to help him get back into bed.  Denied any loss of consciousness or trauma to his head during these falls.  The following day the patient just stayed in bed all day and slept.  His wife brought him his meals and things yesterday due to patient symptoms she had a televisit with his primary care provider who recommended patient come to the hospital.  He denies having any significant fever, chills, chest pain, nausea, vomiting, abdominal pain, or diarrhea.  He chronically has issues with back pain and neuropathy.  Last medication added recently had been acetazolamide for glaucoma.  ED Course: Upon admission to the emergency department patient was seen to be afebrile, respirations 12-23, and orthostatic vital signs positive.  CT scan of the head showed a suspicious age-indeterminate small vessel ischemia in the left pons.  Labs significant for CBC within normal limits, BUN 23, creatinine 1.81(baseline  previously 1.1 in 2015),  glucose 191, CK 154.  Urinalysis showed no acute abnormalities.  Influenza and COVID-19 screening were negative.  Patient was unable to ambulate while in the emergency department.  MRI of the brain was ordered.  TRH called to admit.  Review of Systems  Constitutional: Positive for malaise/fatigue. Negative for fever.  HENT: Negative for nosebleeds and sinus pain.   Eyes: Negative for photophobia and pain.  Respiratory: Negative for cough.   Cardiovascular: Negative for chest pain.  Gastrointestinal: Negative for abdominal pain, nausea and vomiting.  Genitourinary: Negative for dysuria and hematuria.  Musculoskeletal: Positive for back pain.  Neurological: Positive for weakness. Negative for loss of consciousness.  Psychiatric/Behavioral: Negative for substance abuse.    Past Medical History:  Diagnosis Date  . Adenomatous colon polyp   . Allergic rhinitis   . BMI 40.0-44.9, adult ([place])   . Chronic insomnia   . Chronic low back pain   . Constipation, unspecified   . Diabetes mellitus ([place]) 2015  . DJD (degenerative joint disease) of knee   . Hypercalcemia   . Hypertension   . Hypertriglyceridemia   . 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 Lumbar facet arthropathy   . Morbid obesity ([place])   . Neuropathy   . Nonallergic rhinitis   . OSA (obstructive sleep apnea)   . Other chronic pain   . Peripheral axonal neuropathy   . Upper GI bleed    due to aspirin  . Venous insufficiency   .  Venous stasis dermatitis of both lower extremities     Past Surgical History:  Procedure Laterality Date  . cataract     Bilateral  . GLAUCOMA SURGERY    . HAND SURGERY Left    s/p amputation of left finger     reports that he has quit smoking. He has never used smokeless tobacco. He reports that he does not drink alcohol and does not use drugs.  Allergies  Allergen Reactions  . Ambien [Zolpidem Tartrate]     "lost his mind"  . Aspirin     Bleeding ulcer     Family History  Problem Relation Age of Onset  . Gout Father   . Osteoarthritis Mother        Deceased, 36  . Healthy Brother        x2  . Healthy Son        x2    Prior to Admission medications   Medication Sig Start Date End Date Taking? Authorizing Provider  acetaminophen (TYLENOL EX ST ARTHRITIS PAIN) 500 MG tablet Take 1,000 mg by mouth every 6 (six) hours as needed for moderate pain.    [provider]  azelastine (ASTELIN) 137 MCG/SPRAY nasal spray Place 1 spray into both nostrils as needed.  01/28/13   [provider]  Dulaglutide (TRULICITY) A999333 0000000 SOPN Inject into the skin.    [provider]  DULoxetine (CYMBALTA) 60 MG capsule Take 60 mg by mouth daily.    [provider]  furosemide (LASIX) 20 MG tablet Take 20 mg by mouth.    [provider]  gabapentin (NEURONTIN) 300 MG capsule Take 1 capsule (300 mg total) by mouth at bedtime. 09/10/13   Patel, Arvin Collard K, DO  gabapentin (NEURONTIN) 600 MG tablet Take 600 mg by mouth 3 (three) times daily.    [provider]  HYDROcodone-acetaminophen (NORCO/VICODIN) 5-325 MG per tablet Take 1 tablet by mouth every 4 (four) hours as needed for moderate pain or severe pain. 02/18/13   Clayton Bibles, PA-C  ipratropium (ATROVENT) 0.03 % nasal spray Place 2 sprays into both nostrils every 12 (twelve) hours.    [provider]  lidocaine (XYLOCAINE) 5 % ointment Apply to feet three times daily as needed.  Wear gloves when applying. 09/10/13   Patel, Arvin Collard K, DO  lisinopril-hydrochlorothiazide (PRINZIDE,ZESTORETIC) 10-12.5 MG per tablet Take 1 tablet by mouth daily.    [provider]  metFORMIN (GLUCOPHAGE) 500 MG tablet 2 (two) times daily with a meal.  08/30/13   [provider]  Multiple Vitamin (MULTIVITAMIN) tablet Take 1 tablet by mouth daily.    [provider]  potassium chloride (MICRO-K) 10 MEQ CR capsule Take 10 mEq by mouth 2 (two) times  daily.    [provider]  traZODone (DESYREL) 100 MG tablet Take 100 mg by mouth at bedtime.  02/12/13   [provider]  traZODone (DESYREL) 50 MG tablet Take 50 mg by mouth at bedtime.    [provider]  triamcinolone cream (KENALOG) 0.1 % Apply 1 application topically 2 (two) times daily.    [provider]    Physical Exam:  Constitutional: NAD, calm, comfortable Vitals:   01/06/20 0930 01/06/20 0945 01/06/20 1000 01/06/20 1015  BP: (!) 142/69 131/66 132/73 139/65  Pulse: 80 77 80 80  Resp: 14 15 12 15   Temp:      TempSrc:      SpO2: 96% 97% 97% 96%   Eyes: PERRL, lids  and conjunctivae normal ENMT: Mucous membranes are moist. Posterior pharynx clear of any exudate or lesions.Normal dentition.  Neck: normal, supple, no masses, no thyromegaly Respiratory: clear to auscultation bilaterally, no wheezing, no crackles. Normal respiratory effort. No accessory muscle use.  Cardiovascular: Regular rate and rhythm, no murmurs / rubs / gallops. No extremity edema. 2+ pedal pulses. No carotid bruits.  Abdomen: no tenderness, no masses palpated. No hepatosplenomegaly. Bowel sounds positive.  Musculoskeletal: no clubbing / cyanosis. No joint deformity upper and lower extremities. Good ROM, no contractures. Normal muscle tone.  Skin: no rashes, lesions, ulcers. No induration Neurologic: CN 2-12 grossly intact. Sensation intact, DTR normal. Strength 5/5 in all 4.  Psychiatric: Normal judgment and insight. Alert and oriented x 3. Normal mood.     Labs on Admission: I have personally reviewed following labs and imaging studies  CBC: Recent Labs  Lab 01/05/20 1751  WBC 9.1  HGB 13.5  HCT 42.6  MCV 95.9  PLT 99991111   Basic Metabolic Panel: Recent Labs  Lab 01/05/20 1751  NA 139  K 3.5  CL 106  CO2 23  GLUCOSE 191*  BUN 23  CREATININE 1.81*  CALCIUM 9.1   GFR: CrCl cannot be calculated (Unknown ideal weight.). Liver Function Tests: No results  for input(s): AST, ALT, ALKPHOS, BILITOT, PROT, ALBUMIN in the last 168 hours. No results for input(s): LIPASE, AMYLASE in the last 168 hours. No results for input(s): AMMONIA in the last 168 hours. Coagulation Profile: No results for input(s): INR, PROTIME in the last 168 hours. Cardiac Enzymes: Recent Labs  Lab 01/06/20 0743  CKTOTAL 154   BNP (last 3 results) No results for input(s): PROBNP in the last 8760 hours. HbA1C: No results for input(s): HGBA1C in the last 72 hours. CBG: Recent Labs  Lab 01/05/20 1742  GLUCAP 203*   Lipid Profile: No results for input(s): CHOL, HDL, LDLCALC, TRIG, CHOLHDL, LDLDIRECT in the last 72 hours. Thyroid Function Tests: No results for input(s): TSH, T4TOTAL, FREET4, T3FREE, THYROIDAB in the last 72 hours. Anemia Panel: No results for input(s): VITAMINB12, FOLATE, FERRITIN, TIBC, IRON, RETICCTPCT in the last 72 hours. Urine analysis:    Component Value Date/Time   COLORURINE YELLOW 01/06/2020 0805   APPEARANCEUR CLEAR 01/06/2020 0805   LABSPEC 1.017 01/06/2020 0805   PHURINE 5.0 01/06/2020 0805   GLUCOSEU NEGATIVE 01/06/2020 0805   HGBUR NEGATIVE 01/06/2020 0805   BILIRUBINUR NEGATIVE 01/06/2020 0805   KETONESUR NEGATIVE 01/06/2020 0805   PROTEINUR NEGATIVE 01/06/2020 0805   NITRITE NEGATIVE 01/06/2020 0805   LEUKOCYTESUR NEGATIVE 01/06/2020 0805   Sepsis Labs: Recent Results (from the past 240 hour(s))  Resp Panel by RT-PCR (Flu A&B, Covid) Nasopharyngeal Swab     Status: None   Collection Time: 01/06/20  7:43 AM   Specimen: Nasopharyngeal Swab; Nasopharyngeal(NP) swabs in vial transport medium  Result Value Ref Range Status   SARS Coronavirus 2 by RT PCR NEGATIVE NEGATIVE Final    Comment: (NOTE) SARS-CoV-2 target nucleic acids are NOT DETECTED.  The SARS-CoV-2 RNA is generally detectable in upper respiratory specimens during the acute phase of infection. The lowest concentration of SARS-CoV-2 viral copies this assay can  detect is 138 copies/mL. A negative result does not preclude SARS-Cov-2 infection and should not be used as the sole basis for treatment or other patient management decisions. A negative result may occur with  improper specimen collection/handling, submission of specimen other than nasopharyngeal swab, presence of viral mutation(s) within the areas targeted by this assay,  and inadequate number of viral copies(<138 copies/mL). A negative result must be combined with clinical observations, patient history, and epidemiological information. The expected result is Negative.  Fact Sheet for Patients:  BloggerCourse.com  Fact Sheet for Healthcare Providers:  SeriousBroker.it  This test is no t yet approved or cleared by the [place] FDA and  has been authorized for detection and/or diagnosis of SARS-CoV-2 by FDA under an Emergency Use Authorization (EUA). This EUA will remain  in effect (meaning this test can be used) for the duration of the COVID-19 declaration under Section 564(b)(1) of the Act, 21 U.S.C.section 360bbb-3(b)(1), unless the authorization is terminated  or revoked sooner.       Influenza A by PCR NEGATIVE NEGATIVE Final   Influenza B by PCR NEGATIVE NEGATIVE Final    Comment: (NOTE) The Xpert Xpress SARS-CoV-2/FLU/RSV plus assay is intended as an aid in the diagnosis of influenza from Nasopharyngeal swab specimens and should not be used as a sole basis for treatment. Nasal washings and aspirates are unacceptable for Xpert Xpress SARS-CoV-2/FLU/RSV testing.  Fact Sheet for Patients: BloggerCourse.com  Fact Sheet for Healthcare Providers: SeriousBroker.it  This test is not yet approved or cleared by the [place] FDA and has been authorized for detection and/or diagnosis of SARS-CoV-2 by FDA under an Emergency Use Authorization (EUA). This EUA will remain in  effect (meaning this test can be used) for the duration of the COVID-19 declaration under Section 564(b)(1) of the Act, 21 U.S.C. section 360bbb-3(b)(1), unless the authorization is terminated or revoked.  Performed at [facility], [address], [place], [place] [address]      Radiological Exams on Admission: DG Chest 2 View  Result Date: 01/06/2020 CLINICAL DATA:  79 year old male with weakness for 3 days. EXAM: CHEST - 2 VIEW COMPARISON:  Chest radiograph 06/12/2019 and earlier. FINDINGS: Lower lung volumes today. Mild respiratory motion on the lateral today. Mediastinal contours remain within normal limits. No pneumothorax, pulmonary edema, pleural effusion or confluent pulmonary opacity. Visualized tracheal air column is within normal limits. No acute osseous abnormality identified. Negative visible bowel gas pattern. Stable probable vascular calcifications in the upper abdomen. IMPRESSION: Low lung volumes.  No acute cardiopulmonary abnormality. Electronically Signed   By: Odessa Fleming M.D.   On: 01/06/2020 07:37   CT Head Wo Contrast  Result Date: 01/06/2020 CLINICAL DATA:  79 year old male with weakness x3 days. EXAM: CT HEAD WITHOUT CONTRAST TECHNIQUE: Contiguous axial images were obtained from the base of the skull through the vertex without intravenous contrast. COMPARISON:  None. FINDINGS: Brain: Cerebral volume is within normal limits for age. No midline shift, ventriculomegaly, mass effect, evidence of mass lesion, intracranial hemorrhage or evidence of cortically based acute infarction. Subtle hypodensity in the left pons (series 3, image 11), might be artifact. Elsewhere deep gray nuclei, brainstem and cerebellum are within normal limits. Mild for age patchy bilateral white matter hypodensity. Chronic appearing left parietal lobe cortical encephalomalacia on series 3, image 24. Vascular: Calcified atherosclerosis at the skull base. No suspicious intracranial vascular  hyperdensity. Skull: No acute osseous abnormality identified. Sinuses/Orbits: Visualized paranasal sinuses and mastoids are clear. Other: No acute orbit or scalp soft tissue finding. Postoperative changes to both globes. IMPRESSION: 1. Suspicion of age indeterminate small vessel ischemia of the left Pons. Query right side weakness. 2. 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 3. No acute intracranial hemorrhage or acute cortically based infarct  identified. Electronically Signed   By: Genevie Ann M.D.   On: 01/06/2020 07:16    EKG: Independently reviewed.  Normal sinus rhythm at 81 bpm  Assessment/Plan Weakness with frequent falls: Acute.  Patient presents with complaints of generalized weakness over the last 3 days.  Initial CT imaging noted age-indeterminate small vessel ischemia of the left pons. -Admit to a medical telemetry bed -Neurochecks -Follow-up MRI of the brain -PT/OT to evaluate and treat  Acute kidney injury secondary to dehydration/overdiuresis: Patient baseline creatinine previously noted to be around 1.1, but presents with creatinine up to 1.81 with BUN 23.  Urinalysis did not show any acute abnormalities patient had been ordered initially a 500 mL bolus of IV fluids and then a second 500 mL bolus of IV fluids.  Suspect the recent addition of Diamox and likely overdiuresis patient. -Continue normal saline IV fluids at 100 mL/h -Hold nephrotoxic agents -Recheck kidney function in a.m.   Orthostatic hypotension: Acute.  On admission patient's blood pressure dropped from patient with history of hypertension on furosemide 20 mg daily, telmisartan 80 mg daily, amlodipine 5 mg daily, and acetazolamide 500 mg daily.  -Hold home blood pressure medications at this time and consider restarting medically appropriate -IV fluids as seen above -Check orthostatic vital signs in a.m.  Diabetes mellitus type 2 with neuropathy:  Home medications include Trulicity and Metformin 500 mg twice daily. -Hypoglycemic protocol -Check hemoglobin A1c -Hold Metformin and Trulicity -CBGs before every meal with moderate SSI -Adjust insulin regimen as  Glaucoma: Patient's wife notes that Diamox had been added for patient's glaucoma -Continue home regimen of eyedrops -Should likely follow-up with eye doctor in regards to the Diamox  Obesity: BMI 35.42 kg/m  DVT prophylaxis: lovenox  Code Status: Full  Family Communication: Wife updated over the phone Disposition Plan: To be determined Consults called: PT/OT Admission status: Observation  Norval Morton MD [facility]   If 7PM-7AM, please contact night-coverage   01/06/2020, 11:00 AM

## 2020-01-06 NOTE — ED Notes (Signed)
UA and culture collected and sent to lab at this time

## 2020-01-06 NOTE — ED Notes (Signed)
Pt transported to Xray at this time.

## 2020-01-06 NOTE — ED Notes (Signed)
Dinner Tray Ordered @ 1724 

## 2020-01-06 NOTE — ED Provider Notes (Addendum)
[facility] EMERGENCY DEPARTMENT Provider Note   CSN: [id number] Arrival date & time: 01/05/20  1729     History Chief Complaint  Patient presents with  . Weakness    [name] is a 79 y.o. male.  Patient with h/o DM, HTN, high cholesterol, recent PT for frequent falls -- presents to the ED for evaluation of weakness.  Discussed [name] with wife [name] by telephone who gives additional history.  Patient reports acute onset of weakness starting 12/24. He had a fall and was assisted by family.  On the morning of 12/25 he fell again while using walking after he got up from using the bathroom.  It took an hr and a half, but wife and sons were able to get him back in bed. He states that his legs just became weak and he fell to the ground.  Had one additional fall while trying to get up on 12/25 and has been in bed since that time.  He states that he just can't walk. Patient denies signs of stroke including: facial droop, slurred speech, aphasia, weakness/numbness in extremities, imbalance. Denies HA, N/V/D. No CP, abdominal pain. No known sick contacts.         Past Medical History:  Diagnosis Date  . Adenomatous colon polyp   . Allergic rhinitis   . BMI 40.0-44.9, adult ([place])   . Chronic insomnia   . Chronic low back pain   . Constipation, unspecified   . Diabetes mellitus ([place]) 2015  . DJD (degenerative joint disease) of knee   . Hypercalcemia   . Hypertension   . Hypertriglyceridemia   . 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 Lumbar facet arthropathy   . Morbid obesity ([place])   . Neuropathy   . Nonallergic rhinitis   . OSA (obstructive sleep apnea)   . Other chronic pain   . Peripheral axonal neuropathy   . Upper GI bleed    due to aspirin  . Venous insufficiency   . Venous stasis dermatitis of both lower extremities     Patient Active Problem List   Diagnosis Date Noted  . Tachycardia 07/10/2019  . DOE (dyspnea on exertion) 07/10/2019    Past  Surgical History:  Procedure Laterality Date  . cataract     Bilateral  . GLAUCOMA SURGERY    . HAND SURGERY Left    s/p amputation of left finger       Family History  Problem Relation Age of Onset  . Gout Father   . Osteoarthritis Mother        Deceased, 94  . Healthy Brother        x2  . Healthy Son        x2    Social History   Tobacco Use  . Smoking status: Former Research scientist (life sciences)  . Smokeless tobacco: Never Used  . Tobacco comment: Quit 20 years ago.    Substance Use Topics  . Alcohol use: No  . Drug use: No    Home Medications Prior to Admission medications   Medication Sig Start Date End Date Taking? Authorizing Provider  acetaminophen (TYLENOL EX ST ARTHRITIS PAIN) 500 MG tablet Take 1,000 mg by mouth every 6 (six) hours as needed for moderate pain.    [provider]  azelastine (ASTELIN) 137 MCG/SPRAY nasal spray Place 1 spray into both nostrils as needed.  01/28/13   [provider]  Dulaglutide (TRULICITY) A999333 0000000 SOPN Inject into the skin.    [provider]  DULoxetine (CYMBALTA) 60 MG capsule Take 60 mg by mouth daily.    [provider]  furosemide (LASIX) 20 MG tablet Take 20 mg by mouth.    [provider]  gabapentin (NEURONTIN) 300 MG capsule Take 1 capsule (300 mg total) by mouth at bedtime. 09/10/13   Patel, Dennis Collard K, DO  gabapentin (NEURONTIN) 600 MG tablet Take 600 mg by mouth 3 (three) times daily.    [provider]  HYDROcodone-acetaminophen (NORCO/VICODIN) 5-325 MG per tablet Take 1 tablet by mouth every 4 (four) hours as needed for moderate pain or severe pain. 02/18/13   Dennis Bibles, PA-C  ipratropium (ATROVENT) 0.03 % nasal spray Place 2 sprays into both nostrils every 12 (twelve) hours.    [provider]  lidocaine (XYLOCAINE) 5 % ointment Apply to feet three times daily as needed.  Wear gloves when applying. 09/10/13   Patel, Dennis Collard K, DO  lisinopril-hydrochlorothiazide  (PRINZIDE,ZESTORETIC) 10-12.5 MG per tablet Take 1 tablet by mouth daily.    [provider]  metFORMIN (GLUCOPHAGE) 500 MG tablet 2 (two) times daily with a meal.  08/30/13   [provider]  Multiple Vitamin (MULTIVITAMIN) tablet Take 1 tablet by mouth daily.    [provider]  potassium chloride (MICRO-Kennedy) 10 MEQ CR capsule Take 10 mEq by mouth 2 (two) times daily.    [provider]  traZODone (DESYREL) 100 MG tablet Take 100 mg by mouth at bedtime.  02/12/13   [provider]  traZODone (DESYREL) 50 MG tablet Take 50 mg by mouth at bedtime.    [provider]  triamcinolone cream (KENALOG) 0.1 % Apply 1 application topically 2 (two) times daily.    [provider]    Allergies    Ambien [zolpidem tartrate] and Aspirin  Review of Systems   Review of Systems  Constitutional: Negative for appetite change, fever and unexpected weight change.  HENT: Negative for rhinorrhea and sore throat.   Eyes: Negative for redness.  Respiratory: Negative for cough.   Cardiovascular: Negative for chest pain.  Gastrointestinal: Negative for abdominal pain, diarrhea, nausea and vomiting.  Genitourinary: Negative for dysuria and hematuria.  Musculoskeletal: Negative for myalgias.  Skin: Negative for rash.  Neurological: Positive for weakness. Negative for dizziness, syncope, facial asymmetry, numbness and headaches.    Physical Exam Updated Vital Signs BP 136/76   Pulse 81   Temp 97.6 F (36.4 C) (Oral)   Resp 15   SpO2 96%   Physical Exam Vitals and nursing note reviewed.  Constitutional:      Appearance: He is well-developed and well-nourished.  HENT:     Head: Normocephalic and atraumatic.     Right Ear: Tympanic membrane, ear canal and external ear normal.     Left Ear: Tympanic membrane, ear canal and external ear normal.     Nose: Nose normal.     Mouth/Throat:     Mouth: Oropharynx is clear and moist and mucous membranes  are normal.     Pharynx: Uvula midline.  Eyes:     General: Lids are normal.     Extraocular Movements: EOM normal.     Conjunctiva/sclera: Conjunctivae normal.     Pupils: Pupils are equal, round, and reactive to light.  Cardiovascular:     Rate and Rhythm: Normal rate and regular rhythm.  Pulmonary:     Effort: Pulmonary effort is normal.     Breath sounds: Normal breath sounds.  Abdominal:  Palpations: Abdomen is soft.     Tenderness: There is no abdominal tenderness.  Musculoskeletal:        General: Normal range of motion.     Cervical back: Normal range of motion and neck supple. No tenderness or bony tenderness. Normal range of motion.  Skin:    General: Skin is warm and dry.  Neurological:     Mental Status: He is alert and oriented to person, place, and time.     GCS: GCS eye subscore is 4. GCS verbal subscore is 5. GCS motor subscore is 6.     Cranial Nerves: No cranial nerve deficit.     Sensory: No sensory deficit.     Motor: No abnormal muscle tone.     Coordination: He displays a negative Romberg sign. Coordination normal.     Deep Tendon Reflexes: Strength normal and reflexes are normal and symmetric.     Comments: Patient is able to flex and extend at hips and knees and hold his feet in the air bilaterally.  Strength seems symmetric.  Psychiatric:        Mood and Affect: Mood and affect normal.     ED Results / Procedures / Treatments   Labs (all labs ordered are listed, but only abnormal results are displayed) Labs Reviewed  BASIC METABOLIC PANEL - Abnormal; Notable for the following components:      Result Value   Glucose, Bld 191 (*)    Creatinine, Ser 1.81 (*)    GFR, Estimated 37 (*)    All other components within normal limits  CBG MONITORING, ED - Abnormal; Notable for the following components:   Glucose-Capillary 203 (*)    All other components within normal limits  RESP PANEL BY RT-PCR (FLU A&B, COVID) ARPGX2  CBC  URINALYSIS, ROUTINE W  REFLEX MICROSCOPIC  CK  TROPONIN I (HIGH SENSITIVITY)    EKG EKG Interpretation  Date/Time:  Monday January 05 2020 17:42:37 EST Ventricular Rate:  81 PR Interval:  112 QRS Duration: 100 QT Interval:  386 QTC Calculation: 448 R Axis:   -5 Text Interpretation: Normal sinus rhythm Normal ECG When compared with ECG of 11/05/2009, No significant change was found Confirmed by Dione Booze ([id number]) on 01/06/2020 12:46:46 AM   Radiology CT Head Wo Contrast  Result Date: 01/06/2020 CLINICAL DATA:  79 year old male with weakness x3 days. EXAM: CT HEAD WITHOUT CONTRAST TECHNIQUE: Contiguous axial images were obtained from the base of the skull through the vertex without intravenous contrast. COMPARISON:  None. FINDINGS: Brain: Cerebral volume is within normal limits for age. No midline shift, ventriculomegaly, mass effect, evidence of mass lesion, intracranial hemorrhage or evidence of cortically based acute infarction. Subtle hypodensity in the left pons (series 3, image 11), might be artifact. Elsewhere deep gray nuclei, brainstem and cerebellum are within normal limits. Mild for age patchy bilateral white matter hypodensity. Chronic appearing left parietal lobe cortical encephalomalacia on series 3, image 24. Vascular: Calcified atherosclerosis at the skull base. No suspicious intracranial vascular hyperdensity. Skull: No acute osseous abnormality identified. Sinuses/Orbits: Visualized paranasal sinuses and mastoids are clear. Other: No acute orbit or scalp soft tissue finding. Postoperative changes to both globes. IMPRESSION: 1. Suspicion of age indeterminate small vessel ischemia of the left Pons. Query right side weakness. 2. 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 3. No acute intracranial hemorrhage or acute cortically based infarct identified. Electronically Signed   By: Rexene Edison  [place] Crane M.D.   On: 01/06/2020 07:16     Procedures Procedures (including critical care time)  Medications Ordered in ED Medications  sodium chloride 0.9 % bolus 500 mL (0 mLs Intravenous Stopped 01/06/20 [id number])    ED Course  I have reviewed the triage vital signs and the nursing notes.  Pertinent labs & imaging results that were available during my care of the patient were reviewed by me and considered in my medical decision making (see chart for details).  Patient seen and examined. Work-up reviewed/added on other studies. Discussed history with patient's wife by telephone.   Vital signs reviewed and are as follows: BP 126/68   Pulse 80   Temp 97.6 F (36.4 C) (Oral)   Resp 15   SpO2 96%   9:48 AM Labs reviewed. Awaiting orthostatic VS and ambulation trial.   10:38 AM Patient extremely weak and orthostatic.  He is unable to stand without assistance.  He is unable to stand for the 2-minute standing recheck.  Will admit to the hospital for further evaluation.  Given abnormalities on head CT, MRI brain ordered to evaluate for possibility of stroke.  Patient updated, in agreement with plan.  Orthostatic VS for the past 24 hrs:  BP- Lying Pulse- Lying BP- Sitting Pulse- Sitting BP- Standing at 0 minutes Pulse- Standing at 0 minutes  01/06/20 1020 126/66 77 103/83 91 97/72 130   11:07 AM Spoke with Dr. Tamala Julian who will see.   11:12 AM Updated patient's wife by telephone. She is unable to come to the hospital as she has severe neuropathy. She will be available by phone as needed.     MDM Rules/Calculators/A&P                           Final Clinical Impression(s) / ED Diagnoses Final diagnoses:  Orthostatic hypotension  AKI (acute kidney injury) ([place])  Generalized weakness    Rx / DC Orders ED Discharge Orders    None       Carlisle Cater, PA-C 01/06/20 1107    Carlisle Cater, PA-C 01/06/20 1113    Dina Rich, Barbette Hair, MD 01/06/20 1540

## 2020-01-06 NOTE — ED Notes (Signed)
Pt transported to MRI at this time 

## 2020-01-06 NOTE — ED Notes (Signed)
Notified provider of pt orthostatic VS. Not safe to ambulate pt at this time.

## 2020-01-07 DIAGNOSIS — Z79899 Other long term (current) drug therapy: Secondary | ICD-10-CM | POA: Diagnosis not present

## 2020-01-07 DIAGNOSIS — M549 Dorsalgia, unspecified: Secondary | ICD-10-CM | POA: Diagnosis present

## 2020-01-07 DIAGNOSIS — Z6835 Body mass index (BMI) 35.0-35.9, adult: Secondary | ICD-10-CM | POA: Diagnosis not present

## 2020-01-07 DIAGNOSIS — E876 Hypokalemia: Secondary | ICD-10-CM | POA: Diagnosis not present

## 2020-01-07 DIAGNOSIS — E1140 Type 2 diabetes mellitus with diabetic neuropathy, unspecified: Secondary | ICD-10-CM | POA: Diagnosis present

## 2020-01-07 DIAGNOSIS — R279 Unspecified lack of coordination: Secondary | ICD-10-CM | POA: Diagnosis not present

## 2020-01-07 DIAGNOSIS — I10 Essential (primary) hypertension: Secondary | ICD-10-CM | POA: Diagnosis present

## 2020-01-07 DIAGNOSIS — R296 Repeated falls: Secondary | ICD-10-CM | POA: Diagnosis present

## 2020-01-07 DIAGNOSIS — Z886 Allergy status to analgesic agent status: Secondary | ICD-10-CM | POA: Diagnosis not present

## 2020-01-07 DIAGNOSIS — R531 Weakness: Secondary | ICD-10-CM | POA: Diagnosis not present

## 2020-01-07 DIAGNOSIS — G928 Other toxic encephalopathy: Secondary | ICD-10-CM | POA: Diagnosis not present

## 2020-01-07 DIAGNOSIS — E860 Dehydration: Secondary | ICD-10-CM

## 2020-01-07 DIAGNOSIS — E1169 Type 2 diabetes mellitus with other specified complication: Secondary | ICD-10-CM | POA: Diagnosis not present

## 2020-01-07 DIAGNOSIS — H409 Unspecified glaucoma: Secondary | ICD-10-CM | POA: Diagnosis present

## 2020-01-07 DIAGNOSIS — E785 Hyperlipidemia, unspecified: Secondary | ICD-10-CM | POA: Diagnosis present

## 2020-01-07 DIAGNOSIS — Z87891 Personal history of nicotine dependence: Secondary | ICD-10-CM | POA: Diagnosis not present

## 2020-01-07 DIAGNOSIS — E669 Obesity, unspecified: Secondary | ICD-10-CM | POA: Diagnosis present

## 2020-01-07 DIAGNOSIS — R1311 Dysphagia, oral phase: Secondary | ICD-10-CM | POA: Diagnosis not present

## 2020-01-07 DIAGNOSIS — G4733 Obstructive sleep apnea (adult) (pediatric): Secondary | ICD-10-CM | POA: Diagnosis present

## 2020-01-07 DIAGNOSIS — Z8261 Family history of arthritis: Secondary | ICD-10-CM | POA: Diagnosis not present

## 2020-01-07 DIAGNOSIS — N179 Acute kidney failure, unspecified: Secondary | ICD-10-CM | POA: Diagnosis not present

## 2020-01-07 DIAGNOSIS — Z20822 Contact with and (suspected) exposure to covid-19: Secondary | ICD-10-CM | POA: Diagnosis present

## 2020-01-07 DIAGNOSIS — I951 Orthostatic hypotension: Secondary | ICD-10-CM | POA: Diagnosis not present

## 2020-01-07 DIAGNOSIS — I480 Paroxysmal atrial fibrillation: Secondary | ICD-10-CM | POA: Diagnosis not present

## 2020-01-07 DIAGNOSIS — G8929 Other chronic pain: Secondary | ICD-10-CM | POA: Diagnosis present

## 2020-01-07 DIAGNOSIS — G9341 Metabolic encephalopathy: Secondary | ICD-10-CM | POA: Diagnosis not present

## 2020-01-07 DIAGNOSIS — R6889 Other general symptoms and signs: Secondary | ICD-10-CM | POA: Diagnosis not present

## 2020-01-07 DIAGNOSIS — M6281 Muscle weakness (generalized): Secondary | ICD-10-CM | POA: Diagnosis not present

## 2020-01-07 DIAGNOSIS — E781 Pure hyperglyceridemia: Secondary | ICD-10-CM | POA: Diagnosis present

## 2020-01-07 DIAGNOSIS — Z888 Allergy status to other drugs, medicaments and biological substances status: Secondary | ICD-10-CM | POA: Diagnosis not present

## 2020-01-07 DIAGNOSIS — Z743 Need for continuous supervision: Secondary | ICD-10-CM | POA: Diagnosis not present

## 2020-01-07 LAB — BASIC METABOLIC PANEL
Anion gap: 8 (ref 5–15)
BUN: 22 mg/dL (ref 8–23)
CO2: 20 mmol/L — ABNORMAL LOW (ref 22–32)
Calcium: 8.9 mg/dL (ref 8.9–10.3)
Chloride: 112 mmol/L — ABNORMAL HIGH (ref 98–111)
Creatinine, Ser: 1.75 mg/dL — ABNORMAL HIGH (ref 0.61–1.24)
GFR, Estimated: 39 mL/min — ABNORMAL LOW (ref >60)
Glucose, Bld: 138 mg/dL — ABNORMAL HIGH (ref 70–99)
Potassium: 3.2 mmol/L — ABNORMAL LOW (ref 3.5–5.1)
Sodium: 140 mmol/L (ref 135–145)

## 2020-01-07 LAB — GLUCOSE, CAPILLARY
Glucose-Capillary: 119 mg/dL — ABNORMAL HIGH (ref 70–99)
Glucose-Capillary: 129 mg/dL — ABNORMAL HIGH (ref 70–99)
Glucose-Capillary: 135 mg/dL — ABNORMAL HIGH (ref 70–99)
Glucose-Capillary: 122 mg/dL — ABNORMAL HIGH (ref 70–99)
Glucose-Capillary: 114 mg/dL — ABNORMAL HIGH (ref 70–99)

## 2020-01-07 LAB — CBC
HCT: 34.3 % — ABNORMAL LOW (ref 39.0–52.0)
Hemoglobin: 11.9 g/dL — ABNORMAL LOW (ref 13.0–17.0)
MCH: 31.5 pg (ref 26.0–34.0)
MCHC: 34.7 g/dL (ref 30.0–36.0)
MCV: 90.7 fL (ref 80.0–100.0)
Platelets: 218 10*3/uL (ref 150–400)
RBC: 3.78 MIL/uL — ABNORMAL LOW (ref 4.22–5.81)
RDW: 14.6 % (ref 11.5–15.5)
WBC: 7.1 10*3/uL (ref 4.0–10.5)
nRBC: 0 % (ref 0.0–0.2)

## 2020-01-07 NOTE — Evaluation (Signed)
Occupational Therapy Evaluation Patient Details Name: [name] MRN: [id number] DOB: 07/22/1940 Today's Date: 01/07/2020    History of Present Illness 79 y.o. male with medical history significant of hypertension, dyslipidemia, diabetes mellitus type 2, chronic low back pain, glaucoma, and obesity presents with complaints of weakness over the last 3 days.   Clinical Impression   Patient admitted for the above diagnosis.  Patient is presenting with the deficits listed below, which impact his ADL, toileting and mobility status in the acute setting.  He is requiring up to Min A for upper body ADL and basic transfers, and up to max A for lower body ADL.  After speaking with his spouse, the patient walks with a walker at home, and she will assist him with bathing and dressing as needed.  The spouse needs him to walk household distances under his own power.  SNF is recommended based on today's evaluation.  OT will follow in the acute setting.  The patient was found having pulled his IV, condom cath and pulse ox off.  Patient stated he thought he was going home.  Nursing notified.      Follow Up Recommendations  SNF    Equipment Recommendations  3 in 1 bedside commode;Tub/shower seat    Recommendations for Other Services       Precautions / Restrictions Precautions Precautions: Fall Restrictions Weight Bearing Restrictions: No      Mobility Bed Mobility Overal bed mobility: Needs Assistance Bed Mobility: Supine to Sit     Supine to sit: Mod assist          Transfers Overall transfer level: Needs assistance   Transfers: Sit to/from Stand;Stand Pivot Transfers Sit to Stand: Min assist Stand pivot transfers: Min assist            Balance Overall balance assessment: Needs assistance Sitting-balance support: Feet supported Sitting balance-Leahy Scale: Fair     Standing balance support: Bilateral upper extremity supported Standing balance-Leahy Scale: Poor Standing  balance comment: Needs RW                           ADL either performed or assessed with clinical judgement   ADL Overall ADL's : Needs assistance/impaired     Grooming: Wash/dry hands;Wash/dry face;Supervision/safety;Bed level   Upper Body Bathing: Minimal assistance;Sitting   Lower Body Bathing: Maximal assistance;Sitting/lateral leans   Upper Body Dressing : Moderate assistance;Sitting   Lower Body Dressing: Maximal assistance;Sitting/lateral leans               Functional mobility during ADLs: Minimal assistance;Rolling walker General ADL Comments: Increased time.     Vision Baseline Vision/History: No visual deficits Patient Visual Report: No change from baseline       Perception     Praxis      Pertinent Vitals/Pain Pain Assessment: Faces Faces Pain Scale: Hurts even more Pain Location: R calf Pain Descriptors / Indicators: Squeezing;Stabbing;Grimacing Pain Intervention(s): Monitored during session     Hand Dominance Right   Extremity/Trunk Assessment Upper Extremity Assessment Upper Extremity Assessment: Generalized weakness   Lower Extremity Assessment Lower Extremity Assessment: Defer to PT evaluation   Cervical / Trunk Assessment Cervical / Trunk Assessment: Kyphotic   Communication Communication Communication: HOH   Cognition Arousal/Alertness: Awake/alert Behavior During Therapy: Flat affect Overall Cognitive Status: Impaired/Different from baseline Area of Impairment: Attention;Following commands;Safety/judgement;Problem solving                   Current  Attention Level: Sustained   Following Commands: Follows one step commands with increased time Safety/Judgement: Decreased awareness of deficits   Problem Solving: Slow processing;Decreased initiation;Requires verbal cues     General Comments       Exercises     Shoulder Instructions      Home Living Family/patient expects to be discharged to:: Private  residence Living Arrangements: Spouse/significant other Available Help at Discharge: Family;Available 24 hours/day Type of Home: House Home Access: Ramped entrance     Home Layout: One level     Bathroom Shower/Tub: Teacher, early years/pre: Handicapped height Bathroom Accessibility: Yes How Accessible: Accessible via walker Home Equipment: [place] - 2 wheels;Walker - 4 wheels          Prior Functioning/Environment Level of Independence: Needs assistance  Gait / Transfers Assistance Needed: Patient needs supervision for mobility in the home and Min A on uneven surfaces. ADL's / Homemaking Assistance Needed: Spouse states patient was able to participate with bathing and dressing, but she did provide Min to Mod A.  Patient was able to toilet himself, but she stayed close.  He dose not drive or participate with meals or home management.  Needs assist with meds.  Sons are available, but only if called. Communication / Swallowing Assistance Needed: WFL's          OT Problem List: Decreased strength;Decreased activity tolerance;Impaired balance (sitting and/or standing);Decreased safety awareness;Pain      OT Treatment/Interventions: Self-care/ADL training;Therapeutic exercise;DME and/or AE instruction;Balance training;Therapeutic activities;Cognitive remediation/compensation    OT Goals(Current goals can be found in the care plan section) Acute Rehab OT Goals Patient Stated Goal: Patient is hoping o retun home OT Goal Formulation: With patient Time For Goal Achievement: 01/21/20 Potential to Achieve Goals: Fair ADL Goals Pt Will Perform Grooming: with set-up;sitting Pt Will Perform Upper Body Bathing: with supervision;sitting Pt Will Perform Lower Body Bathing: with min assist;sit to/from stand Pt Will Perform Upper Body Dressing: with supervision;sitting Pt Will Perform Lower Body Dressing: with min assist;sit to/from stand Pt Will Transfer to Toilet: with  supervision;stand pivot transfer;bedside commode Pt Will Perform Toileting - Clothing Manipulation and hygiene: with min guard assist;sitting/lateral leans  OT Frequency: Min 2X/week   Barriers to D/C:    none noted       Co-evaluation              AM-PAC OT "6 Clicks" Daily Activity     Outcome Measure Help from another person eating meals?: None Help from another person taking care of personal grooming?: A Little Help from another person toileting, which includes using toliet, bedpan, or urinal?: A Lot Help from another person bathing (including washing, rinsing, drying)?: A Lot Help from another person to put on and taking off regular upper body clothing?: A Little Help from another person to put on and taking off regular lower body clothing?: A Lot 6 Click Score: 16   End of Session Equipment Utilized During Treatment: Gait belt;Rolling walker Nurse Communication: Mobility status  Activity Tolerance: Patient tolerated treatment well Patient left: in chair;with call bell/phone within reach;with chair alarm set;with nursing/sitter in room  OT Visit Diagnosis: Unsteadiness on feet (R26.81);Muscle weakness (generalized) (M62.81);History of falling (Z91.81);Pain Pain - Right/Left: Right Pain - part of body: Leg                Time: [id number] OT Time Calculation (min): 15 min Charges:  OT General Charges $OT Visit: 1 Visit OT Evaluation $OT Eval Moderate Complexity: 1  Mod  01/07/2020  Rich, OTR/L  Acute Rehabilitation Services  Office:  [place] 01/07/2020, 12:07 PM

## 2020-01-07 NOTE — Progress Notes (Signed)
PROGRESS NOTE    [name]  GUR:[id number] DOB: July 20, 1940 DOA: 01/05/2020 PCP: Kirby Funk, MD    Brief Narrative:  79 y.o. male with medical history significant of hypertension, dyslipidemia, diabetes mellitus type 2, chronic low back pain, glaucoma, and obesity presents with complaints of weakness over the last 3 days.  At baseline patient utilizes a walker to get around.  He normally backs his walker into his bedroom to sleep at night.  His wife reported that 4 days ago he seemed to be dragging his feet some.  Following day patient fell while in the bathroom.  His wife uses a cane and was unable to help him get up.  One of the patient's son who lives with them and the other who does not had to help get him up.  Patient reportedly fell again later on that day, but they were able to help him get back into bed.  Denied any loss of consciousness or trauma to his head during these falls.  The following day the patient just stayed in bed all day and slept.  His wife brought him his meals and things yesterday due to patient symptoms she had a televisit with his primary care provider who recommended patient come to the hospital.  He denies having any significant fever, chills, chest pain, nausea, vomiting, abdominal pain, or diarrhea.  He chronically has issues with back pain and neuropathy.  Last medication added recently had been acetazolamide for glaucoma.  ED Course: Upon admission to the emergency department patient was seen to be afebrile, respirations 12-23, and orthostatic vital signs positive.  CT scan of the head showed a suspicious age-indeterminate small vessel ischemia in the left pons.  Labs significant for CBC within normal limits, BUN 23, creatinine 1.81(baseline previously 1.1 in 2015),  glucose 191, CK 154.  Urinalysis showed no acute abnormalities.  Influenza and COVID-19 screening were negative  Assessment & Plan:   Active Problems:   AKI (acute kidney injury) (HCC)    Weakness   Dehydration   Orthostatic hypotension   Diabetes mellitus type 2 in obese (HCC)   Glaucoma  Weakness with frequent falls: Acute.  Patient presents with complaints of generalized weakness over the last 3 days prior to visit.  Initial CT imaging noted age-indeterminate small vessel ischemia of the left pons. -Neurochecks -MRI brain personally reviewed, neg for acute infarct -PT/OT recs for SNF  Acute kidney injury secondary to dehydration/overdiuresis:  -Patient baseline creatinine previously noted to be around 1.1, but presents with creatinine up to 1.81 with BUN 23.  Urinalysis did not show any acute abnormalities patient had been ordered initially a 500 mL bolus of IV fluids and then a second 500 mL bolus of IV fluids.   -Concerns for dehydration given recent addition of Diamox -Continue normal saline IV fluids at 100 mL/h -Hold nephrotoxic agents -Cr improved somewhat to 1.75   Orthostatic hypotension: Acute.   -On admission patient's blood pressure dropped from patient with history of hypertension on furosemide 20 mg daily, telmisartan 80 mg daily, amlodipine 5 mg daily, and acetazolamide 500 mg daily.  -Hold home blood pressure medications at time of presentation -Given IV fluids as per above  Diabetes mellitus type 2 with neuropathy: Home medications include Trulicity and Metformin 500 mg twice daily. -Hypoglycemic protocol -Check hemoglobin A1c -Hold Metformin and Trulicity while in hospital -Cont with SSI coverage  Glaucoma: Patient's wife notes that Diamox had been added for patient's glaucoma -Continue home regimen of eyedrops  Toxic metabolic encephalopathy -Seems somewhat confused this AM -Suspect related to above dehydration. Cont IVF for now  DVT prophylaxis: Lovenox subq Code Status: Full Family Communication: Pt in room, family not at bedside  Status is: Observation  The patient will require care spanning > 2 midnights and should be moved to  inpatient because: IV treatments appropriate due to intensity of illness or inability to take PO  Dispo: The patient is from: Home              Anticipated d/c is to: SNF              Anticipated d/c date is: 3 days              Patient currently is not medically stable to d/c.       Consultants:     Procedures:     Antimicrobials: Anti-infectives (From admission, onward)   None       Subjective: Still feeling thirsty. Appears somewhat confused  Objective: Vitals:   01/07/20 0454 01/07/20 0500 01/07/20 0758 01/07/20 1157  BP:   (!) 123/57 (!) 152/89  Pulse:   63 73  Resp:  20 16 (!) 22  Temp: 99 F (37.2 C)  99.1 F (37.3 C) 98.6 F (37 C)  TempSrc: Oral  Oral Oral  SpO2: 95%  98% 97%  Weight:      Height:        Intake/Output Summary (Last 24 hours) at 01/07/2020 1631 Last data filed at 01/07/2020 1214 Gross per 24 hour  Intake 1630 ml  Output 1050 ml  Net 580 ml   Filed Weights   01/06/20 1851  Weight: 108.8 kg    Examination:  General exam: Appears calm and comfortable  Respiratory system: Clear to auscultation. Respiratory effort normal. Cardiovascular system: S1 & S2 heard, Regular Gastrointestinal system: Abdomen is nondistended, soft and nontender. No organomegaly or masses felt. Normal bowel sounds heard. Central nervous system: Alert and oriented. No focal neurological deficits. Extremities: Symmetric 5 x 5 power. Skin: No rashes, lesions Psychiatry: Judgement and insight appear normal. Mood & affect appropriate.   Data Reviewed: I have personally reviewed following labs and imaging studies  CBC: Recent Labs  Lab 01/05/20 1751 01/07/20 0143  WBC 9.1 7.1  HGB 13.5 11.9*  HCT 42.6 34.3*  MCV 95.9 90.7  PLT 271 [id number]   Basic Metabolic Panel: Recent Labs  Lab 01/05/20 1751 01/07/20 0143  NA 139 140  K 3.5 3.2*  CL 106 112*  CO2 23 20*  GLUCOSE 191* 138*  BUN 23 22  CREATININE 1.81* 1.75*  CALCIUM 9.1 8.9    GFR: Estimated Creatinine Clearance: 40.9 mL/min (A) (by C-G formula based on SCr of 1.75 mg/dL (H)). Liver Function Tests: No results for input(s): AST, ALT, ALKPHOS, BILITOT, PROT, ALBUMIN in the last 168 hours. No results for input(s): LIPASE, AMYLASE in the last 168 hours. No results for input(s): AMMONIA in the last 168 hours. Coagulation Profile: No results for input(s): INR, PROTIME in the last 168 hours. Cardiac Enzymes: Recent Labs  Lab 01/06/20 0743  CKTOTAL 154   BNP (last 3 results) No results for input(s): PROBNP in the last 8760 hours. HbA1C: Recent Labs    01/06/20 2009  HGBA1C 6.9*   CBG: Recent Labs  Lab 01/05/20 1742 01/06/20 2132 01/07/20 0624 01/07/20 0840 01/07/20 1158  GLUCAP 203* 158* 119* 129* 135*   Lipid Profile: No results for input(s): CHOL, HDL, LDLCALC, TRIG, CHOLHDL, LDLDIRECT in  the last 72 hours. Thyroid Function Tests: No results for input(s): TSH, T4TOTAL, FREET4, T3FREE, THYROIDAB in the last 72 hours. Anemia Panel: No results for input(s): VITAMINB12, FOLATE, FERRITIN, TIBC, IRON, RETICCTPCT in the last 72 hours. Sepsis Labs: No results for input(s): PROCALCITON, LATICACIDVEN in the last 168 hours.  Recent Results (from the past 240 hour(s))  Resp Panel by RT-PCR (Flu A&B, Covid) Nasopharyngeal Swab     Status: None   Collection Time: 01/06/20  7:43 AM   Specimen: Nasopharyngeal Swab; Nasopharyngeal(NP) swabs in vial transport medium  Result Value Ref Range Status   SARS Coronavirus 2 by RT PCR NEGATIVE NEGATIVE Final    Comment: (NOTE) SARS-CoV-2 target nucleic acids are NOT DETECTED.  The SARS-CoV-2 RNA is generally detectable in upper respiratory specimens during the acute phase of infection. The lowest concentration of SARS-CoV-2 viral copies this assay can detect is 138 copies/mL. A negative result does not preclude SARS-Cov-2 infection and should not be used as the sole basis for treatment or other patient  management decisions. A negative result may occur with  improper specimen collection/handling, submission of specimen other than nasopharyngeal swab, presence of viral mutation(s) within the areas targeted by this assay, and inadequate number of viral copies(<138 copies/mL). A negative result must be combined with clinical observations, patient history, and epidemiological information. The expected result is Negative.  Fact Sheet for Patients:  EntrepreneurPulse.com.au  Fact Sheet for Healthcare Providers:  IncredibleEmployment.be  This test is no t yet approved or cleared by the [place] FDA and  has been authorized for detection and/or diagnosis of SARS-CoV-2 by FDA under an Emergency Use Authorization (EUA). This EUA will remain  in effect (meaning this test can be used) for the duration of the COVID-19 declaration under Section 564(b)(1) of the Act, 21 U.S.C.section 360bbb-3(b)(1), unless the authorization is terminated  or revoked sooner.       Influenza A by PCR NEGATIVE NEGATIVE Final   Influenza B by PCR NEGATIVE NEGATIVE Final    Comment: (NOTE) The Xpert Xpress SARS-CoV-2/FLU/RSV plus assay is intended as an aid in the diagnosis of influenza from Nasopharyngeal swab specimens and should not be used as a sole basis for treatment. Nasal washings and aspirates are unacceptable for Xpert Xpress SARS-CoV-2/FLU/RSV testing.  Fact Sheet for Patients: EntrepreneurPulse.com.au  Fact Sheet for Healthcare Providers: IncredibleEmployment.be  This test is not yet approved or cleared by the [place] FDA and has been authorized for detection and/or diagnosis of SARS-CoV-2 by FDA under an Emergency Use Authorization (EUA). This EUA will remain in effect (meaning this test can be used) for the duration of the COVID-19 declaration under Section 564(b)(1) of the Act, 21 U.S.C. section 360bbb-3(b)(1),  unless the authorization is terminated or revoked.  Performed at [facility], [address] [place], [place] [address]      Radiology Studies: DG Chest 2 View  Result Date: 01/06/2020 CLINICAL DATA:  79 year old male with weakness for 3 days. EXAM: CHEST - 2 VIEW COMPARISON:  Chest radiograph 06/12/2019 and earlier. FINDINGS: Lower lung volumes today. Mild respiratory motion on the lateral today. Mediastinal contours remain within normal limits. No pneumothorax, pulmonary edema, pleural effusion or confluent pulmonary opacity. Visualized tracheal air column is within normal limits. No acute osseous abnormality identified. Negative visible bowel gas pattern. Stable probable vascular calcifications in the upper abdomen. IMPRESSION: Low lung volumes.  No acute cardiopulmonary abnormality. Electronically Signed   By: Genevie Ann M.D.   On: 01/06/2020 07:37  CT Head Wo Contrast  Result Date: 01/06/2020 CLINICAL DATA:  79 year old male with weakness x3 days. EXAM: CT HEAD WITHOUT CONTRAST TECHNIQUE: Contiguous axial images were obtained from the base of the skull through the vertex without intravenous contrast. COMPARISON:  None. FINDINGS: Brain: Cerebral volume is within normal limits for age. No midline shift, ventriculomegaly, mass effect, evidence of mass lesion, intracranial hemorrhage or evidence of cortically based acute infarction. Subtle hypodensity in the left pons (series 3, image 11), might be artifact. Elsewhere deep gray nuclei, brainstem and cerebellum are within normal limits. Mild for age patchy bilateral white matter hypodensity. Chronic appearing left parietal lobe cortical encephalomalacia on series 3, image 24. Vascular: Calcified atherosclerosis at the skull base. No suspicious intracranial vascular hyperdensity. Skull: No acute osseous abnormality identified. Sinuses/Orbits: Visualized paranasal sinuses and mastoids are clear. Other: No acute orbit or scalp soft tissue  finding. Postoperative changes to both globes. IMPRESSION: 1. Suspicion of age indeterminate small vessel ischemia of the left Pons. Query right side weakness. 2. 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 3. No acute intracranial hemorrhage or acute cortically based infarct identified. Electronically Signed   By: Genevie Ann M.D.   On: 01/06/2020 07:16   MR BRAIN WO CONTRAST  Result Date: 01/06/2020 CLINICAL DATA:  Stroke.  Weakness. EXAM: MRI HEAD WITHOUT CONTRAST TECHNIQUE: Multiplanar, multiecho pulse sequences of the brain and surrounding structures were obtained without intravenous contrast. COMPARISON:  CT head 01/06/2020 FINDINGS: Brain: Negative for acute infarct. Mild atrophy without hydrocephalus. Patchy hyperintensity in the pons bilaterally right greater than left consistent with chronic ischemia. Mild chronic white matter changes. Small chronic infarct left frontal lobe. Negative for hemorrhage or mass. Vascular: Normal arterial flow voids. Skull and upper cervical spine: Negative Sinuses/Orbits: Paranasal sinuses clear. Small right mastoid effusion. Bilateral cataract extraction Other: None IMPRESSION: Negative for acute infarct. Atrophy and chronic ischemic change in the white matter and pons. Electronically Signed   By: Franchot Gallo M.D.   On: 01/06/2020 11:42    Scheduled Meds: . brimonidine  1 drop Both Eyes BID  . DULoxetine  60 mg Oral Daily  . enoxaparin (LOVENOX) injection  40 mg Subcutaneous Q24H  . insulin aspart  0-15 Units Subcutaneous TID WC  . insulin aspart  0-5 Units Subcutaneous QHS  . latanoprost  1 drop Right Eye QHS  . sodium chloride flush  3 mL Intravenous Q12H  . timolol  1 drop Right Eye BID   Continuous Infusions: . sodium chloride 100 mL/hr at 01/07/20 1613     LOS: 0 days   Marylu Lund, MD [facility] Pager On Amion  If 7PM-7AM, please contact night-coverage 01/07/2020,  4:31 PM

## 2020-01-07 NOTE — Evaluation (Signed)
Physical Therapy Evaluation Patient Details Name: [name] MRN: [id number] DOB: 20-Jul-1940 Today's Date: 01/07/2020   History of Present Illness  79 y.o. male with medical history significant of hypertension, dyslipidemia, diabetes mellitus type 2, chronic low back pain, glaucoma, and obesity presents with complaints of weakness over the last 3 days.CT scan of head showed age indeterminate small vessel ischemia of the L pons, MRI with similar results, but no acute infarct, AKI due to dehydration, orthostatic hypotension.  Clinical Impression  Pt required min assist overall for transitions and transfers using RW, however, he could only walk a few feet (3') before fatigue set in and therapist had to pull recliner up behind him to sit.  He is unstable over R LE during the few steps forward he did take and at a very high risk of falling.  He would most benefit from SNF placement for rehab prior to returning home with his wife who could only provide supervision level of assist as she uses a cane herself.   PT to follow acutely for deficits listed below.      Follow Up Recommendations SNF    Equipment Recommendations  Wheelchair (measurements PT);Wheelchair cushion (measurements PT);3in1 (PT)    Recommendations for Other Services       Precautions / Restrictions Precautions Precautions: Fall Precaution Comments: chart reports h/o recent falls Restrictions Weight Bearing Restrictions: No      Mobility  Bed Mobility Overal bed mobility: Needs Assistance Bed Mobility: Supine to Sit     Supine to sit: Mod assist     General bed mobility comments: Pt just finished with OT and was OOB in the recliner chair.    Transfers Overall transfer level: Needs assistance Equipment used: Rolling walker (2 wheeled) Transfers: Sit to/from [employer] Sit to Stand: Min assist Stand pivot transfers: Min assist       General transfer comment: Min assist to stand from low  recliner and higher BSC.  Frequent repetition of cues for safe hand placement for transitions sit/stand as he kept reaching to pull on the RW.  Ambulation/Gait Ambulation/Gait assistance: Min assist Gait Distance (Feet): 3 Feet Assistive device: Rolling walker (2 wheeled) Gait Pattern/deviations: Step-through pattern;Shuffle;Antalgic;Trunk flexed     General Gait Details: Attempted to have pt walk forward away from the recliner chair and he made it a few feet, antalgic over his R leg, unable to maintain cues to keep body inside of RW and flexing forward.  he was unable to go further, so I unlocked the recliner and brought it up behind him.  Stairs            Wheelchair Mobility    Modified Rankin (Stroke Patients Only)       Balance Overall balance assessment: Needs assistance Sitting-balance support: Feet supported Sitting balance-Leahy Scale: Fair     Standing balance support: Bilateral upper extremity supported Standing balance-Leahy Scale: Poor Standing balance comment: Needs RW                             Pertinent Vitals/Pain Pain Assessment: Faces Faces Pain Scale: Hurts even more Pain Location: bil LEs R>L Pain Descriptors / Indicators: Grimacing;Guarding Pain Intervention(s): Limited activity within patient's tolerance;Monitored during session;Repositioned    Home Living Family/patient expects to be discharged to:: Private residence Living Arrangements: Spouse/significant other Available Help at Discharge: Family;Available 24 hours/day Type of Home: House Home Access: Ramped entrance     Home Layout: One  level Home Equipment: Walker - 2 wheels;Walker - 4 wheels      Prior Function Level of Independence: Needs assistance   Gait / Transfers Assistance Needed: Patient needs supervision for mobility in the home and Min A on uneven surfaces.  ADL's / Homemaking Assistance Needed: Spouse states patient was able to participate with bathing and  dressing, but she did provide Min to Mod A.  Patient was able to toilet himself, but she stayed close.  He dose not drive or participate with meals or home management.  Needs assist with meds.  Sons are available, but only if called. (per OT evaluation)        Hand Dominance   Dominant Hand: Right    Extremity/Trunk Assessment   Upper Extremity Assessment Upper Extremity Assessment: Defer to OT evaluation    Lower Extremity Assessment Lower Extremity Assessment: RLE deficits/detail;LLE deficits/detail RLE Deficits / Details: functionally, R knee seems to be painful/unstable in WB as he had a very short stance time on R LE during attempts at gait/transfer, however, when asked he reported both knees and legs hurt and cramped.  In sitting ankle at least 3/5, knee 3-/5 (very tight hamstrings bil), and hip flexion 3-/5 per gross seated assessment. Bil LEs with what appears to be poor circulation (bright red lower legs with dry scabs). LLE Deficits / Details: functionally, R knee seems to be painful/unstable in WB as he had a very short stance time on R LE during attempts at gait/transfer, however, when asked he reported both knees and legs hurt and cramped.  In sitting ankle at least 3/5, knee 3-/5 (very tight hamstrings bil), and hip flexion 3-/5 per gross seated assessment. Bil LEs with what appears to be poor circulation (bright red lower legs with dry scabs).    Cervical / Trunk Assessment Cervical / Trunk Assessment: Kyphotic  Communication   Communication: HOH  Cognition Arousal/Alertness: Awake/alert Behavior During Therapy: WFL for tasks assessed/performed Overall Cognitive Status: No family/caregiver present to determine baseline cognitive functioning Area of Impairment: Attention;Following commands;Safety/judgement;Problem solving                   Current Attention Level: Sustained   Following Commands: Follows one step commands with increased time Safety/Judgement:  Decreased awareness of safety;Decreased awareness of deficits   Problem Solving: Slow processing;Decreased initiation;Requires verbal cues General Comments: Pt a bit slow to process, at times needs repetition (this could be complicated by being [facility] as well), per OT had no awareness of lines and he pulled most of them off during OT session.      General Comments      Exercises     Assessment/Plan    PT Assessment Patient needs continued PT services  PT Problem List Decreased strength;Decreased range of motion;Decreased activity tolerance;Decreased balance;Decreased mobility       PT Treatment Interventions DME instruction;Gait training;Functional mobility training;Therapeutic activities;Therapeutic exercise;Balance training;Cognitive remediation;Patient/family education;Wheelchair mobility training    PT Goals (Current goals can be found in the Care Plan section)  Acute Rehab PT Goals Patient Stated Goal: pt wants to go home PT Goal Formulation: With patient Time For Goal Achievement: 01/21/20 Potential to Achieve Goals: Good    Frequency Min 2X/week   Barriers to discharge Decreased caregiver support wife herself uses a cane, sons are available "on call" basis    Co-evaluation               AM-PAC PT "6 Clicks" Mobility  Outcome Measure Help needed turning from  your back to your side while in a flat bed without using bedrails?: A Little Help needed moving from lying on your back to sitting on the side of a flat bed without using bedrails?: A Little Help needed moving to and from a bed to a chair (including a wheelchair)?: A Little Help needed standing up from a chair using your arms (e.g., wheelchair or bedside chair)?: A Little Help needed to walk in hospital room?: A Lot Help needed climbing 3-5 steps with a railing? : Total 6 Click Score: 15    End of Session Equipment Utilized During Treatment: Gait belt Activity Tolerance: Patient limited by pain;Patient  limited by fatigue Patient left: in chair;with call bell/phone within reach;with chair alarm set Nurse Communication: Mobility status (to RN tech) PT Visit Diagnosis: Muscle weakness (generalized) (M62.81);Difficulty in walking, not elsewhere classified (R26.2);Pain Pain - Right/Left: Right Pain - part of body: Leg    Time: [id number] PT Time Calculation (min) (ACUTE ONLY): 18 min   Charges:   PT Evaluation $PT Eval Moderate Complexity: Wells, PT, DPT  Acute Rehabilitation [phone] pager [phone]) [phone] office

## 2020-01-08 DIAGNOSIS — R531 Weakness: Secondary | ICD-10-CM

## 2020-01-08 DIAGNOSIS — E860 Dehydration: Secondary | ICD-10-CM | POA: Diagnosis not present

## 2020-01-08 LAB — COMPREHENSIVE METABOLIC PANEL
ALT: 15 U/L (ref 0–44)
AST: 16 U/L (ref 15–41)
Albumin: 2.9 g/dL — ABNORMAL LOW (ref 3.5–5.0)
Alkaline Phosphatase: 53 U/L (ref 38–126)
Anion gap: 11 (ref 5–15)
BUN: 14 mg/dL (ref 8–23)
CO2: 16 mmol/L — ABNORMAL LOW (ref 22–32)
Calcium: 8.7 mg/dL — ABNORMAL LOW (ref 8.9–10.3)
Chloride: 113 mmol/L — ABNORMAL HIGH (ref 98–111)
Creatinine, Ser: 1.43 mg/dL — ABNORMAL HIGH (ref 0.61–1.24)
GFR, Estimated: 50 mL/min — ABNORMAL LOW (ref >60)
Glucose, Bld: 120 mg/dL — ABNORMAL HIGH (ref 70–99)
Potassium: 3.2 mmol/L — ABNORMAL LOW (ref 3.5–5.1)
Sodium: 140 mmol/L (ref 135–145)
Total Bilirubin: 0.6 mg/dL (ref 0.3–1.2)
Total Protein: 6 g/dL — ABNORMAL LOW (ref 6.5–8.1)

## 2020-01-08 LAB — GLUCOSE, CAPILLARY
Glucose-Capillary: 111 mg/dL — ABNORMAL HIGH (ref 70–99)
Glucose-Capillary: 132 mg/dL — ABNORMAL HIGH (ref 70–99)
Glucose-Capillary: 102 mg/dL — ABNORMAL HIGH (ref 70–99)
Glucose-Capillary: 101 mg/dL — ABNORMAL HIGH (ref 70–99)

## 2020-01-08 LAB — MAGNESIUM: Magnesium: 1.9 mg/dL (ref 1.7–2.4)

## 2020-01-08 MED ORDER — POTASSIUM CHLORIDE CRYS ER 20 MEQ PO TBCR
40.0000 meq | EXTENDED_RELEASE_TABLET | Freq: Two times a day (BID) | ORAL | Status: AC
  Administered 2020-01-08 (×2): 40 meq via ORAL
  Filled 2020-01-08 (×2): qty 2

## 2020-01-08 MED ORDER — GABAPENTIN 600 MG PO TABS
600.0000 mg | ORAL_TABLET | Freq: Three times a day (TID) | ORAL | Status: DC
  Administered 2020-01-08 – 2020-01-13 (×13): 600 mg via ORAL
  Filled 2020-01-08 (×13): qty 1

## 2020-01-08 MED ORDER — GABAPENTIN 300 MG PO CAPS
300.0000 mg | ORAL_CAPSULE | Freq: Every day | ORAL | Status: DC
  Administered 2020-01-08 – 2020-01-13 (×6): 300 mg via ORAL
  Filled 2020-01-08 (×6): qty 1

## 2020-01-08 NOTE — Progress Notes (Signed)
Unable to complete orthostatic vitals. Laying: BP 146/71 (92) HR 65 Sitting: BP 139/81 (97) HR 70 Standing: unable to obtain d/t pt getting weak and buckling down to the bed.

## 2020-01-08 NOTE — Progress Notes (Addendum)
PROGRESS NOTE    [name]  [id number] DOB: 09-13-1940 DOA: 01/05/2020 PCP: Lavone Orn, MD    Brief Narrative:  79 y.o. male with medical history significant of hypertension, dyslipidemia, diabetes mellitus type 2, chronic low back pain, glaucoma, and obesity presents with complaints of weakness over the last 3 days.  At baseline patient utilizes a walker to get around.  He normally backs his walker into his bedroom to sleep at night.  His wife reported that 4 days ago he seemed to be dragging his feet some.  Following day patient fell while in the bathroom.  His wife uses a cane and was unable to help him get up.  One of the patient's son who lives with them and the other who does not had to help get him up.  Patient reportedly fell again later on that day, but they were able to help him get back into bed.  Denied any loss of consciousness or trauma to his head during these falls.  The following day the patient just stayed in bed all day and slept.  His wife brought him his meals and things yesterday due to patient symptoms she had a televisit with his primary care provider who recommended patient come to the hospital.  He denies having any significant fever, chills, chest pain, nausea, vomiting, abdominal pain, or diarrhea.  He chronically has issues with back pain and neuropathy.  Last medication added recently had been acetazolamide for glaucoma.  ED Course: Upon admission to the emergency department patient was seen to be afebrile, respirations 12-23, and orthostatic vital signs positive.  CT scan of the head showed a suspicious age-indeterminate small vessel ischemia in the left pons.  Labs significant for CBC within normal limits, BUN 23, creatinine 1.81(baseline previously 1.1 in 2015),  glucose 191, CK 154.  Urinalysis showed no acute abnormalities.  Influenza and COVID-19 screening were negative  Assessment & Plan:   Active Problems:   AKI (acute kidney injury) ([place])    Weakness   Dehydration   Orthostatic hypotension   Diabetes mellitus type 2 in obese (HCC)   Glaucoma  Weakness with frequent falls: Acute.  Patient presents with complaints of generalized weakness over the last 3 days prior to visit.  Initial CT imaging noted age-indeterminate small vessel ischemia of the left pons. -MRI brain personally reviewed, neg for acute infarct -PT/OT recs for SNF, TOC consulted  Acute kidney injury secondary to dehydration/overdiuresis:  -Patient baseline creatinine previously noted to be around 1.1, but presents with creatinine up to 1.81 with BUN 23.  Urinalysis did not show any acute abnormalities patient had been ordered initially a 500 mL bolus of IV fluids and then a second 500 mL bolus of IV fluids.   -Concerns for dehydration given recent addition of Diamox -Continue normal saline IV fluids at 100 mL/h -Hold nephrotoxic agents -Cr cont to improve to 1.43 today. Repeat bmet in AM  Orthostatic hypotension: Acute.   -On admission patient's blood pressure dropped from patient with history of hypertension on furosemide 20 mg daily, telmisartan 80 mg daily, amlodipine 5 mg daily, and acetazolamide 500 mg daily.  -Hold home blood pressure medications at time of presentation -On IVF hydration  Diabetes mellitus type 2 with neuropathy: Home medications include Trulicity and Metformin 500 mg twice daily. -Hypoglycemic protocol -Check hemoglobin A1c -Hold Metformin and Trulicity while in hospital -Cont with SSI coverage as needed  Glaucoma: Patient's wife notes that Diamox was added for patient's glaucoma -Continue home  regimen of eyedrops  Toxic metabolic encephalopathy -Seems somewhat confused this AM -Suspect related to above dehydration, improving. Cont IVF for now  Hypokalemia -Will replace -Repeat lytes in AM  DVT prophylaxis: Lovenox subq Code Status: Full Family Communication: Pt in room, Pt's wife is at bedside  Status is:  Inpatient  The patient will require care spanning > 2 midnights and should be moved to inpatient because: IV treatments appropriate due to intensity of illness or inability to take PO  Dispo: The patient is from: Home              Anticipated d/c is to: SNF              Anticipated d/c date is: 3 days              Patient currently is not medically stable to d/c.   Consultants:     Procedures:     Antimicrobials: Anti-infectives (From admission, onward)   None      Subjective: States feeling eager to go home soon  Objective: Vitals:   01/07/20 2328 01/08/20 0321 01/08/20 0844 01/08/20 1155  BP: 135/67 125/64 (!) 123/59 (!) 148/68  Pulse:   80 79  Resp: 16 18 13 19   Temp: 98 F (36.7 C) 97.9 F (36.6 C) 98.9 F (37.2 C) 98.9 F (37.2 C)  TempSrc: Oral Oral Oral Oral  SpO2: 96% 95% 91% 95%  Weight:      Height:        Intake/Output Summary (Last 24 hours) at 01/08/2020 1602 Last data filed at 01/08/2020 0847 Gross per 24 hour  Intake 2495 ml  Output 600 ml  Net 1895 ml   Filed Weights   01/06/20 1851  Weight: 108.8 kg    Examination: General exam: Awake, laying in bed, in nad, mucus membranes dry Respiratory system: Normal respiratory effort, no wheezing Cardiovascular system: regular rate, s1, s2 Gastrointestinal system: Soft, nondistended, positive BS Central nervous system: CN2-12 grossly intact, strength intact Extremities: Perfused, no clubbing Skin: Normal skin turgor, no notable skin lesions seen Psychiatry: Mood normal // no visual hallucinations   Data Reviewed: I have personally reviewed following labs and imaging studies  CBC: Recent Labs  Lab 01/05/20 1751 01/07/20 0143  WBC 9.1 7.1  HGB 13.5 11.9*  HCT 42.6 34.3*  MCV 95.9 90.7  PLT 271 218   Basic Metabolic Panel: Recent Labs  Lab 01/05/20 1751 01/07/20 0143 01/08/20 0102 01/08/20 0803  NA 139 140 140  --   K 3.5 3.2* 3.2*  --   CL 106 112* 113*  --   CO2 23 20* 16*   --   GLUCOSE 191* 138* 120*  --   BUN 23 22 14   --   CREATININE 1.81* 1.75* 1.43*  --   CALCIUM 9.1 8.9 8.7*  --   MG  --   --   --  1.9   GFR: Estimated Creatinine Clearance: 50.1 mL/min (A) (by C-G formula based on SCr of 1.43 mg/dL (H)). Liver Function Tests: Recent Labs  Lab 01/08/20 0102  AST 16  ALT 15  ALKPHOS 53  BILITOT 0.6  PROT 6.0*  ALBUMIN 2.9*   No results for input(s): LIPASE, AMYLASE in the last 168 hours. No results for input(s): AMMONIA in the last 168 hours. Coagulation Profile: No results for input(s): INR, PROTIME in the last 168 hours. Cardiac Enzymes: Recent Labs  Lab 01/06/20 0743  CKTOTAL 154   BNP (last 3 results) No  results for input(s): PROBNP in the last 8760 hours. HbA1C: Recent Labs    01/06/20 2009  HGBA1C 6.9*   CBG: Recent Labs  Lab 01/07/20 1158 01/07/20 1759 01/07/20 2127 01/08/20 0607 01/08/20 1153  GLUCAP 135* 122* 114* 111* 132*   Lipid Profile: No results for input(s): CHOL, HDL, LDLCALC, TRIG, CHOLHDL, LDLDIRECT in the last 72 hours. Thyroid Function Tests: No results for input(s): TSH, T4TOTAL, FREET4, T3FREE, THYROIDAB in the last 72 hours. Anemia Panel: No results for input(s): VITAMINB12, FOLATE, FERRITIN, TIBC, IRON, RETICCTPCT in the last 72 hours. Sepsis Labs: No results for input(s): PROCALCITON, LATICACIDVEN in the last 168 hours.  Recent Results (from the past 240 hour(s))  Resp Panel by RT-PCR (Flu A&B, Covid) Nasopharyngeal Swab     Status: None   Collection Time: 01/06/20  7:43 AM   Specimen: Nasopharyngeal Swab; Nasopharyngeal(NP) swabs in vial transport medium  Result Value Ref Range Status   SARS Coronavirus 2 by RT PCR NEGATIVE NEGATIVE Final    Comment: (NOTE) SARS-CoV-2 target nucleic acids are NOT DETECTED.  The SARS-CoV-2 RNA is generally detectable in upper respiratory specimens during the acute phase of infection. The lowest concentration of SARS-CoV-2 viral copies this assay can detect  is 138 copies/mL. A negative result does not preclude SARS-Cov-2 infection and should not be used as the sole basis for treatment or other patient management decisions. A negative result may occur with  improper specimen collection/handling, submission of specimen other than nasopharyngeal swab, presence of viral mutation(s) within the areas targeted by this assay, and inadequate number of viral copies(<138 copies/mL). A negative result must be combined with clinical observations, patient history, and epidemiological information. The expected result is Negative.  Fact Sheet for Patients:  EntrepreneurPulse.com.au  Fact Sheet for Healthcare Providers:  IncredibleEmployment.be  This test is no t yet approved or cleared by the [place] FDA and  has been authorized for detection and/or diagnosis of SARS-CoV-2 by FDA under an Emergency Use Authorization (EUA). This EUA will remain  in effect (meaning this test can be used) for the duration of the COVID-19 declaration under Section 564(b)(1) of the Act, 21 U.S.C.section 360bbb-3(b)(1), unless the authorization is terminated  or revoked sooner.       Influenza A by PCR NEGATIVE NEGATIVE Final   Influenza B by PCR NEGATIVE NEGATIVE Final    Comment: (NOTE) The Xpert Xpress SARS-CoV-2/FLU/RSV plus assay is intended as an aid in the diagnosis of influenza from Nasopharyngeal swab specimens and should not be used as a sole basis for treatment. Nasal washings and aspirates are unacceptable for Xpert Xpress SARS-CoV-2/FLU/RSV testing.  Fact Sheet for Patients: EntrepreneurPulse.com.au  Fact Sheet for Healthcare Providers: IncredibleEmployment.be  This test is not yet approved or cleared by the [place] FDA and has been authorized for detection and/or diagnosis of SARS-CoV-2 by FDA under an Emergency Use Authorization (EUA). This EUA will remain in effect  (meaning this test can be used) for the duration of the COVID-19 declaration under Section 564(b)(1) of the Act, 21 U.S.C. section 360bbb-3(b)(1), unless the authorization is terminated or revoked.  Performed at [facility], [place] [address] [place], [place] [address]      Radiology Studies: No results found.  Scheduled Meds:  brimonidine  1 drop Both Eyes BID   DULoxetine  60 mg Oral Daily   enoxaparin (LOVENOX) injection  40 mg Subcutaneous Q24H   insulin aspart  0-15 Units Subcutaneous TID WC   insulin aspart  0-5 Units  Subcutaneous QHS   latanoprost  1 drop Right Eye QHS   potassium chloride  40 mEq Oral BID   sodium chloride flush  3 mL Intravenous Q12H   timolol  1 drop Right Eye BID   Continuous Infusions:  sodium chloride 100 mL/hr at 01/08/20 1242     LOS: 1 day   Marylu Lund, MD [facility] Pager On Amion  If 7PM-7AM, please contact night-coverage 01/08/2020, 4:02 PM

## 2020-01-09 DIAGNOSIS — R531 Weakness: Secondary | ICD-10-CM | POA: Diagnosis not present

## 2020-01-09 DIAGNOSIS — E669 Obesity, unspecified: Secondary | ICD-10-CM

## 2020-01-09 DIAGNOSIS — E1169 Type 2 diabetes mellitus with other specified complication: Secondary | ICD-10-CM | POA: Diagnosis not present

## 2020-01-09 DIAGNOSIS — E860 Dehydration: Secondary | ICD-10-CM | POA: Diagnosis not present

## 2020-01-09 LAB — GLUCOSE, CAPILLARY
Glucose-Capillary: 110 mg/dL — ABNORMAL HIGH (ref 70–99)
Glucose-Capillary: 117 mg/dL — ABNORMAL HIGH (ref 70–99)
Glucose-Capillary: 112 mg/dL — ABNORMAL HIGH (ref 70–99)
Glucose-Capillary: 126 mg/dL — ABNORMAL HIGH (ref 70–99)
Glucose-Capillary: 104 mg/dL — ABNORMAL HIGH (ref 70–99)

## 2020-01-09 LAB — COMPREHENSIVE METABOLIC PANEL
ALT: 14 U/L (ref 0–44)
AST: 43 U/L — ABNORMAL HIGH (ref 15–41)
Albumin: 2.8 g/dL — ABNORMAL LOW (ref 3.5–5.0)
Alkaline Phosphatase: 50 U/L (ref 38–126)
Anion gap: 9 (ref 5–15)
BUN: 13 mg/dL (ref 8–23)
CO2: 16 mmol/L — ABNORMAL LOW (ref 22–32)
Calcium: 8.7 mg/dL — ABNORMAL LOW (ref 8.9–10.3)
Chloride: 114 mmol/L — ABNORMAL HIGH (ref 98–111)
Creatinine, Ser: 1.42 mg/dL — ABNORMAL HIGH (ref 0.61–1.24)
GFR, Estimated: 50 mL/min — ABNORMAL LOW (ref >60)
Glucose, Bld: 119 mg/dL — ABNORMAL HIGH (ref 70–99)
Potassium: 4.7 mmol/L (ref 3.5–5.1)
Sodium: 139 mmol/L (ref 135–145)
Total Bilirubin: 1.7 mg/dL — ABNORMAL HIGH (ref 0.3–1.2)
Total Protein: 5.6 g/dL — ABNORMAL LOW (ref 6.5–8.1)

## 2020-01-09 LAB — CBC
HCT: 32.5 % — ABNORMAL LOW (ref 39.0–52.0)
Hemoglobin: 11.3 g/dL — ABNORMAL LOW (ref 13.0–17.0)
MCH: 31.8 pg (ref 26.0–34.0)
MCHC: 34.8 g/dL (ref 30.0–36.0)
MCV: 91.5 fL (ref 80.0–100.0)
Platelets: 198 10*3/uL (ref 150–400)
RBC: 3.55 MIL/uL — ABNORMAL LOW (ref 4.22–5.81)
RDW: 14.6 % (ref 11.5–15.5)
WBC: 7.5 10*3/uL (ref 4.0–10.5)
nRBC: 0 % (ref 0.0–0.2)

## 2020-01-09 NOTE — TOC Initial Note (Signed)
Transition of Care [facility]) - Initial/Assessment Note    Patient Details  Name: [name] MRN: [id number] Date of Birth: 1940-10-14  [facility] CM/SW Contact:    Benard Halsted, [place] Phone Number: 01/09/2020, 3:20 PM  Clinical Narrative:                 CSW received consult for possible SNF placement at time of discharge. CSW spoke with patient. Patient reported that patient's spouse is currently unable to care for patient at their home given patient's current physical needs and fall risk. Patient expressed understanding of PT recommendation and is agreeable to SNF placement at time of discharge, however, he also requests to return home to see the fireworks tonight and wants his wife to pick him up. He provided CSW with permission to contact his wife. CSW spoke with his wife. She stated she walks with a cane herself and is unable to assist him at this time. She has told him that he needs to go to rehab. Patient's spouse reports preference for [facility]. CSW discussed insurance authorization process and provided Medicare SNF ratings list. Patient has received the COVID vaccines. CSW awaiting response from [facility] on bed availability over the holiday weekend. Patient will require insurance approval and COVID test.   Expected Discharge Plan: Skilled Nursing Facility Barriers to Discharge: Insurance Authorization,SNF Pending bed offer   Patient Goals and CMS Choice Patient states their goals for this hospitalization and ongoing recovery are:: Rehab CMS Medicare.gov Compare Post Acute Care list provided to:: Patient Choice offered to / list presented to : [facility]  Expected Discharge Plan and Services Expected Discharge Plan: [place] In-house Referral: Clinical Social Work   Post Acute Care Choice: [place] Living arrangements for the past 2 months: [place]                                      Prior Living  Arrangements/Services Living arrangements for the past 2 months: Single Family Home Lives with:: Spouse Patient language and need for interpreter reviewed:: Yes Do you feel safe going back to the place where you live?: Yes      Need for Family Participation in Patient Care: Yes (Comment) Care giver support system in place?: Yes (comment)   Criminal Activity/Legal Involvement Pertinent to Current Situation/Hospitalization: No - Comment as needed  Activities of Daily Living      Permission Sought/Granted Permission sought to share information with : Facility Contact Representative,Family Supports Permission granted to share information with : Yes, Verbal Permission Granted  Share Information with NAME: [name]  Permission granted to share info w AGENCY: SNFs  Permission granted to share info w Relationship: Spouse  Permission granted to share info w Contact Information: [phone]  Emotional Assessment Appearance:: Appears stated age Attitude/Demeanor/Rapport: Engaged Affect (typically observed): Accepting,Appropriate,Hopeful Orientation: : Oriented to Self,Oriented to Place,Oriented to  Time Alcohol / Substance Use: Not Applicable Psych Involvement: No (comment)  Admission diagnosis:  Orthostatic hypotension [I95.1] Generalized weakness [R53.1] AKI (acute kidney injury) ([place]) [N17.9] Dehydration [E86.0] Patient Active Problem List   Diagnosis Date Noted  . AKI (acute kidney injury) ([place]) 01/06/2020  . Weakness 01/06/2020  . Dehydration 01/06/2020  . Orthostatic hypotension 01/06/2020  . Diabetes mellitus type 2 in obese ([place]) 01/06/2020  . Glaucoma 01/06/2020  . Tachycardia 07/10/2019  . DOE (dyspnea on exertion) 07/10/2019   PCP:  Laurann [place],  Jonny Ruiz, MD Pharmacy:   [facility] [address], [place] - [address] [place] [address] Phone: [phone] Fax: [phone]     Social Determinants of Health (SDOH) Interventions     Readmission Risk Interventions No flowsheet data found.

## 2020-01-09 NOTE — NC FL2 (Signed)
[place] MEDICAID FL2 LEVEL OF CARE SCREENING TOOL     IDENTIFICATION  [name] Birthdate: [birth year] Sex: male Admission Date (Current Location): 01/05/2020  [facility] and [place] Number:  Producer, television/film/video and Address:  [facility], [address], [place], [place] [address]      Provider Number: [id number]  Attending Physician Name and Address:  Jerald Kief, MD  Relative Name and Phone Number:  [name], spouse, [phone]    Current Level of Care: Hospital Recommended Level of Care: Skilled Nursing Facility Prior Approval Number:    Date Approved/Denied:   PASRR Number: [id number]  Discharge Plan: SNF    Current Diagnoses: Patient Active Problem List   Diagnosis Date Noted  . AKI (acute kidney injury) (HCC) 01/06/2020  . Weakness 01/06/2020  . Dehydration 01/06/2020  . Orthostatic hypotension 01/06/2020  . Diabetes mellitus type 2 in obese (HCC) 01/06/2020  . Glaucoma 01/06/2020  . Tachycardia 07/10/2019  . DOE (dyspnea on exertion) 07/10/2019    Orientation RESPIRATION BLADDER Height & Weight     Self,Time,Place  Normal Incontinent Weight: 239 lb 13.8 oz (108.8 kg) Height:  5\' 9"  (175.3 cm)  BEHAVIORAL SYMPTOMS/MOOD NEUROLOGICAL BOWEL NUTRITION STATUS      Continent Diet (Please see DC Summary)  AMBULATORY STATUS COMMUNICATION OF NEEDS Skin   Limited Assist Verbally Normal                       Personal Care Assistance Level of Assistance  Bathing,Feeding,Dressing Bathing Assistance: Limited assistance Feeding assistance: Limited assistance Dressing Assistance: Limited assistance     Functional Limitations Info  Hearing   Hearing Info: Impaired      SPECIAL CARE FACTORS FREQUENCY  PT (By licensed PT),OT (By licensed OT)     PT Frequency: 5x/week OT Frequency: 5x/week            Contractures Contractures Info: Not present    Additional Factors Info  Code  Status,Allergies,Psychotropic,Insulin Sliding Scale Code Status Info: Full Allergies Info: Ambien (Zolpidem Tartrate), Aspirin, Dorzolamide Hcl-timolol Mal Psychotropic Info: Cymbalta Insulin Sliding Scale Info: See DC Summary       Current Medications (01/09/2020):  This is the current hospital active medication list Current Facility-Administered Medications  Medication Dose Route Frequency Provider Last Rate Last Admin  . 0.9 %  sodium chloride infusion   Intravenous Continuous 01/11/2020 A, MD 100 mL/hr at 01/09/20 1004 New Bag at 01/09/20 1004  . acetaminophen (TYLENOL) tablet 650 mg  650 mg Oral Q6H PRN 01/11/20 A, MD   650 mg at 01/06/20 1723   Or  . acetaminophen (TYLENOL) suppository 650 mg  650 mg Rectal Q6H PRN Smith, Rondell A, MD      . albuterol (PROVENTIL) (2.5 MG/3ML) 0.083% nebulizer solution 2.5 mg  2.5 mg Nebulization Q6H PRN Smith, Rondell A, MD      . brimonidine (ALPHAGAN) 0.2 % ophthalmic solution 1 drop  1 drop Both Eyes BID 01/08/20 A, MD   1 drop at 01/09/20 1040  . DULoxetine (CYMBALTA) DR capsule 60 mg  60 mg Oral Daily 01/11/20, Rondell A, MD   60 mg at 01/09/20 1027  . enoxaparin (LOVENOX) injection 40 mg  40 mg Subcutaneous Q24H Smith, Rondell A, MD   40 mg at 01/09/20 1027  . gabapentin (NEURONTIN) capsule 300 mg  300 mg Oral QHS 01/11/20, MD   300 mg at 01/08/20 2127  .  gabapentin (NEURONTIN) tablet 600 mg  600 mg Oral TID WC Donne Hazel, MD   600 mg at 01/09/20 1027  . HYDROcodone-acetaminophen (NORCO/VICODIN) 5-325 MG per tablet 1 tablet  1 tablet Oral Q4H PRN Norval Morton, MD   1 tablet at 01/08/20 2127  . insulin aspart (novoLOG) injection 0-15 Units  0-15 Units Subcutaneous TID WC Fuller Plan A, MD   2 Units at 01/08/20 1240  . insulin aspart (novoLOG) injection 0-5 Units  0-5 Units Subcutaneous QHS Smith, Rondell A, MD      . latanoprost (XALATAN) 0.005 % ophthalmic solution 1 drop  1 drop Right Eye QHS Fuller Plan A, MD    1 drop at 01/08/20 2125  . ondansetron (ZOFRAN) tablet 4 mg  4 mg Oral Q6H PRN Fuller Plan A, MD       Or  . ondansetron (ZOFRAN) injection 4 mg  4 mg Intravenous Q6H PRN Smith, Rondell A, MD      . sodium chloride flush (NS) 0.9 % injection 3 mL  3 mL Intravenous Q12H Smith, Rondell A, MD   3 mL at 01/08/20 2127  . timolol (TIMOPTIC) 0.5 % ophthalmic solution 1 drop  1 drop Right Eye BID Fuller Plan A, MD   1 drop at 01/08/20 2125     Discharge Medications: Please see discharge summary for a list of discharge medications.  Relevant Imaging Results:  Relevant Lab Results:   Additional Information SSN: [id number]. Received Pfizer 03/21/19, 04/11/19, 10/31/19  Benard Halsted, LCSW

## 2020-01-09 NOTE — Progress Notes (Signed)
PROGRESS NOTE    [name]  [id number] DOB: February 10, 1940 DOA: 01/05/2020 PCP: Lavone Orn, MD    Brief Narrative:  79 y.o. male with medical history significant of hypertension, dyslipidemia, diabetes mellitus type 2, chronic low back pain, glaucoma, and obesity presents with complaints of weakness over the last 3 days.  At baseline patient utilizes a walker to get around.  He normally backs his walker into his bedroom to sleep at night.  His wife reported that 4 days ago he seemed to be dragging his feet some.  Following day patient fell while in the bathroom.  His wife uses a cane and was unable to help him get up.  One of the patient's son who lives with them and the other who does not had to help get him up.  Patient reportedly fell again later on that day, but they were able to help him get back into bed.  Denied any loss of consciousness or trauma to his head during these falls.  The following day the patient just stayed in bed all day and slept.  His wife brought him his meals and things yesterday due to patient symptoms she had a televisit with his primary care provider who recommended patient come to the hospital.  He denies having any significant fever, chills, chest pain, nausea, vomiting, abdominal pain, or diarrhea.  He chronically has issues with back pain and neuropathy.  Last medication added recently had been acetazolamide for glaucoma.  ED Course: Upon admission to the emergency department patient was seen to be afebrile, respirations 12-23, and orthostatic vital signs positive.  CT scan of the head showed a suspicious age-indeterminate small vessel ischemia in the left pons.  Labs significant for CBC within normal limits, BUN 23, creatinine 1.81(baseline previously 1.1 in 2015),  glucose 191, CK 154.  Urinalysis showed no acute abnormalities.  Influenza and COVID-19 screening were negative  Assessment & Plan:   Active Problems:   AKI (acute kidney injury) ([place])    Weakness   Dehydration   Orthostatic hypotension   Diabetes mellitus type 2 in obese (HCC)   Glaucoma  Weakness with frequent falls: Acute.  Patient presents with complaints of generalized weakness over the last 3 days prior to visit.  Initial CT imaging noted age-indeterminate small vessel ischemia of the left pons. -MRI brain personally reviewed, neg for acute infarct -Staff reports pt is markedly weak, difficult to stand and buckling down to bed. Family reports this is an acute decompensation for pt over his baseline -PT/OT recs for SNF, TOC following  Acute kidney injury secondary to dehydration/overdiuresis:  -Patient baseline creatinine previously noted to be around 1.1, but presents with creatinine up to 1.81 with BUN 23.  Urinalysis did not show any acute abnormalities patient had been ordered initially a 500 mL bolus of IV fluids and then a second 500 mL bolus of IV fluids.   -Concerns for dehydration given recent addition of Diamox -Continue normal saline IV fluids at 100 mL/h -Hold nephrotoxic agents -Cr cont to improve to 1.42 today. Repeat bmet in AM  Orthostatic hypotension: Acute.   -On admission patient's blood pressure dropped from patient with history of hypertension on furosemide 20 mg daily, telmisartan 80 mg daily, amlodipine 5 mg daily, and acetazolamide 500 mg daily.  -Hold home blood pressure medications at time of presentation -Continued on IVF hydration  Diabetes mellitus type 2 with neuropathy: Home medications include Trulicity and Metformin 500 mg twice daily. -Hypoglycemic protocol -Check hemoglobin A1c -  Hold Metformin and Trulicity while in hospital -Cont with SSI coverage as needed  Glaucoma: Patient's wife notes that Diamox was added for patient's glaucoma -Continue home regimen of eyedrops  Toxic metabolic encephalopathy -Seems somewhat confused this AM -Suspect related to above dehydration, improving. Cont IVF for now  Hypokalemia -Will  replace -Repeat lytes in AM  DVT prophylaxis: Lovenox subq Code Status: Full Family Communication: Pt in room, Pt's wife is at bedside on 12/30  Status is: Inpatient  The patient will require care spanning > 2 midnights and should be moved to inpatient because: IV treatments appropriate due to intensity of illness or inability to take PO  Dispo: The patient is from: Home              Anticipated d/c is to: SNF              Anticipated d/c date is: 3 days              Patient currently is not medically stable to d/c.   Consultants:     Procedures:     Antimicrobials: Anti-infectives (From admission, onward)   None      Subjective: Eager to go home but also keen on therapy to regain strength  Objective: Vitals:   01/08/20 2004 01/08/20 2320 01/09/20 0431 01/09/20 0712  BP: (!) 120/53  133/62 128/67  Pulse: 61  65 60  Resp: 20 20 20 13   Temp: 98.8 F (37.1 C) 98.6 F (37 C) 98.2 F (36.8 C) 98.1 F (36.7 C)  TempSrc: Oral Oral Oral Oral  SpO2: 99% 96% 94%   Weight:      Height:        Intake/Output Summary (Last 24 hours) at 01/09/2020 1451 Last data filed at 01/08/2020 1616 Gross per 24 hour  Intake 894.21 ml  Output --  Net 894.21 ml   Filed Weights   01/06/20 1851  Weight: 108.8 kg    Examination: General exam: Awake, laying in bed, in nad Respiratory system: Normal respiratory effort, no wheezing Cardiovascular system: regular rate, s1, s2 Gastrointestinal system: Soft, nondistended, positive BS Central nervous system: CN2-12 grossly intact, strength intact Extremities: Perfused, no clubbing Skin: Normal skin turgor, no notable skin lesions seen Psychiatry: Mood normal // no visual hallucinations   Data Reviewed: I have personally reviewed following labs and imaging studies  CBC: Recent Labs  Lab 01/05/20 1751 01/07/20 0143 01/09/20 0033  WBC 9.1 7.1 7.5  HGB 13.5 11.9* 11.3*  HCT 42.6 34.3* 32.5*  MCV 95.9 90.7 91.5  PLT 271 218  [id number]   Basic Metabolic Panel: Recent Labs  Lab 01/05/20 1751 01/07/20 0143 01/08/20 0102 01/08/20 0803 01/09/20 0033  NA 139 140 140  --  139  K 3.5 3.2* 3.2*  --  4.7  CL 106 112* 113*  --  114*  CO2 23 20* 16*  --  16*  GLUCOSE 191* 138* 120*  --  119*  BUN 23 22 14   --  13  CREATININE 1.81* 1.75* 1.43*  --  1.42*  CALCIUM 9.1 8.9 8.7*  --  8.7*  MG  --   --   --  1.9  --    GFR: Estimated Creatinine Clearance: 50.4 mL/min (A) (by C-G formula based on SCr of 1.42 mg/dL (H)). Liver Function Tests: Recent Labs  Lab 01/08/20 0102 01/09/20 0033  AST 16 43*  ALT 15 14  ALKPHOS 53 50  BILITOT 0.6 1.7*  PROT 6.0* 5.6*  ALBUMIN 2.9* 2.8*   No results for input(s): LIPASE, AMYLASE in the last 168 hours. No results for input(s): AMMONIA in the last 168 hours. Coagulation Profile: No results for input(s): INR, PROTIME in the last 168 hours. Cardiac Enzymes: Recent Labs  Lab 01/06/20 0743  CKTOTAL 154   BNP (last 3 results) No results for input(s): PROBNP in the last 8760 hours. HbA1C: Recent Labs    01/06/20 2009  HGBA1C 6.9*   CBG: Recent Labs  Lab 01/08/20 1657 01/08/20 2136 01/09/20 0604 01/09/20 0810 01/09/20 1207  GLUCAP 102* 101* 110* 117* 112*   Lipid Profile: No results for input(s): CHOL, HDL, LDLCALC, TRIG, CHOLHDL, LDLDIRECT in the last 72 hours. Thyroid Function Tests: No results for input(s): TSH, T4TOTAL, FREET4, T3FREE, THYROIDAB in the last 72 hours. Anemia Panel: No results for input(s): VITAMINB12, FOLATE, FERRITIN, TIBC, IRON, RETICCTPCT in the last 72 hours. Sepsis Labs: No results for input(s): PROCALCITON, LATICACIDVEN in the last 168 hours.  Recent Results (from the past 240 hour(s))  Resp Panel by RT-PCR (Flu A&B, Covid) Nasopharyngeal Swab     Status: None   Collection Time: 01/06/20  7:43 AM   Specimen: Nasopharyngeal Swab; Nasopharyngeal(NP) swabs in vial transport medium  Result Value Ref Range Status   SARS Coronavirus 2  by RT PCR NEGATIVE NEGATIVE Final    Comment: (NOTE) SARS-CoV-2 target nucleic acids are NOT DETECTED.  The SARS-CoV-2 RNA is generally detectable in upper respiratory specimens during the acute phase of infection. The lowest concentration of SARS-CoV-2 viral copies this assay can detect is 138 copies/mL. A negative result does not preclude SARS-Cov-2 infection and should not be used as the sole basis for treatment or other patient management decisions. A negative result may occur with  improper specimen collection/handling, submission of specimen other than nasopharyngeal swab, presence of viral mutation(s) within the areas targeted by this assay, and inadequate number of viral copies(<138 copies/mL). A negative result must be combined with clinical observations, patient history, and epidemiological information. The expected result is Negative.  Fact Sheet for Patients:  EntrepreneurPulse.com.au  Fact Sheet for Healthcare Providers:  IncredibleEmployment.be  This test is no t yet approved or cleared by the [place] FDA and  has been authorized for detection and/or diagnosis of SARS-CoV-2 by FDA under an Emergency Use Authorization (EUA). This EUA will remain  in effect (meaning this test can be used) for the duration of the COVID-19 declaration under Section 564(b)(1) of the Act, 21 U.S.C.section 360bbb-3(b)(1), unless the authorization is terminated  or revoked sooner.       Influenza A by PCR NEGATIVE NEGATIVE Final   Influenza B by PCR NEGATIVE NEGATIVE Final    Comment: (NOTE) The Xpert Xpress SARS-CoV-2/FLU/RSV plus assay is intended as an aid in the diagnosis of influenza from Nasopharyngeal swab specimens and should not be used as a sole basis for treatment. Nasal washings and aspirates are unacceptable for Xpert Xpress SARS-CoV-2/FLU/RSV testing.  Fact Sheet for Patients: EntrepreneurPulse.com.au  Fact  Sheet for Healthcare Providers: IncredibleEmployment.be  This test is not yet approved or cleared by the [place] FDA and has been authorized for detection and/or diagnosis of SARS-CoV-2 by FDA under an Emergency Use Authorization (EUA). This EUA will remain in effect (meaning this test can be used) for the duration of the COVID-19 declaration under Section 564(b)(1) of the Act, 21 U.S.C. section 360bbb-3(b)(1), unless the authorization is terminated or revoked.  Performed at [facility], [place] [address], [place], [place]  [address]      Radiology Studies: No results found.  Scheduled Meds: . brimonidine  1 drop Both Eyes BID  . DULoxetine  60 mg Oral Daily  . enoxaparin (LOVENOX) injection  40 mg Subcutaneous Q24H  . gabapentin  300 mg Oral QHS  . gabapentin  600 mg Oral TID WC  . insulin aspart  0-15 Units Subcutaneous TID WC  . insulin aspart  0-5 Units Subcutaneous QHS  . latanoprost  1 drop Right Eye QHS  . sodium chloride flush  3 mL Intravenous Q12H  . timolol  1 drop Right Eye BID   Continuous Infusions: . sodium chloride 100 mL/hr at 01/09/20 1004     LOS: 2 days   Rickey Barbara, MD [facility] Pager On Amion  If 7PM-7AM, please contact night-coverage 01/09/2020, 2:51 PM

## 2020-01-10 DIAGNOSIS — E669 Obesity, unspecified: Secondary | ICD-10-CM | POA: Diagnosis not present

## 2020-01-10 DIAGNOSIS — E1169 Type 2 diabetes mellitus with other specified complication: Secondary | ICD-10-CM | POA: Diagnosis not present

## 2020-01-10 DIAGNOSIS — I480 Paroxysmal atrial fibrillation: Secondary | ICD-10-CM

## 2020-01-10 DIAGNOSIS — R531 Weakness: Secondary | ICD-10-CM | POA: Diagnosis not present

## 2020-01-10 DIAGNOSIS — N179 Acute kidney failure, unspecified: Secondary | ICD-10-CM | POA: Diagnosis not present

## 2020-01-10 HISTORY — DX: Paroxysmal atrial fibrillation: I48.0

## 2020-01-10 LAB — GLUCOSE, CAPILLARY
Glucose-Capillary: 95 mg/dL (ref 70–99)
Glucose-Capillary: 110 mg/dL — ABNORMAL HIGH (ref 70–99)
Glucose-Capillary: 159 mg/dL — ABNORMAL HIGH (ref 70–99)
Glucose-Capillary: 162 mg/dL — ABNORMAL HIGH (ref 70–99)

## 2020-01-10 LAB — COMPREHENSIVE METABOLIC PANEL
ALT: 15 U/L (ref 0–44)
AST: 19 U/L (ref 15–41)
Albumin: 2.7 g/dL — ABNORMAL LOW (ref 3.5–5.0)
Alkaline Phosphatase: 51 U/L (ref 38–126)
Anion gap: 8 (ref 5–15)
BUN: 9 mg/dL (ref 8–23)
CO2: 17 mmol/L — ABNORMAL LOW (ref 22–32)
Calcium: 8.7 mg/dL — ABNORMAL LOW (ref 8.9–10.3)
Chloride: 113 mmol/L — ABNORMAL HIGH (ref 98–111)
Creatinine, Ser: 1.26 mg/dL — ABNORMAL HIGH (ref 0.61–1.24)
GFR, Estimated: 58 mL/min — ABNORMAL LOW (ref >60)
Glucose, Bld: 114 mg/dL — ABNORMAL HIGH (ref 70–99)
Potassium: 3.5 mmol/L (ref 3.5–5.1)
Sodium: 138 mmol/L (ref 135–145)
Total Bilirubin: 0.7 mg/dL (ref 0.3–1.2)
Total Protein: 5.6 g/dL — ABNORMAL LOW (ref 6.5–8.1)

## 2020-01-10 MED ORDER — BRIMONIDINE TARTRATE 0.2 % OP SOLN
1.0000 [drp] | Freq: Two times a day (BID) | OPHTHALMIC | Status: DC
  Administered 2020-01-10 – 2020-01-14 (×8): 1 [drp] via OPHTHALMIC

## 2020-01-10 MED ORDER — NETARSUDIL-LATANOPROST 0.02-0.005 % OP SOLN
1.0000 [drp] | Freq: Every day | OPHTHALMIC | Status: DC
  Administered 2020-01-10 – 2020-01-13 (×4): 1 [drp] via OPHTHALMIC
  Filled 2020-01-10 (×2): qty 1

## 2020-01-10 NOTE — TOC Progression Note (Signed)
Transition of Care [facility]) - Progression Note    Patient Details  Name: [name] MRN: [id number] Date of Birth: 07-Aug-1940  Transition of [facility]) CM/SW Contact  Patrice [place], [place] Phone Number: [phone] 01/10/2020, 3:16 PM  Clinical Narrative:     CSW attempted to call [facility] to confirm a bed offer. CSW was unable to reach anyone or leave a VM.   TOC team will continue to assist with discharge planning needs.  Expected Discharge Plan: Skilled Nursing Facility Barriers to Discharge: Insurance Authorization,SNF Pending bed offer  Expected Discharge Plan and Services Expected Discharge Plan: Skilled Nursing Facility In-house Referral: Clinical Social Work   Post Acute Care Choice: Skilled Nursing Facility Living arrangements for the past 2 months: Single Family Home                                       Social Determinants of Health (SDOH) Interventions    Readmission Risk Interventions No flowsheet data found.

## 2020-01-10 NOTE — Progress Notes (Signed)
PROGRESS NOTE    [name]  HFW:[id number] DOB: 20-Jun-1940 DOA: 01/05/2020 PCP: Kirby Funk, MD    Brief Narrative:  80 y.o. male with medical history significant of hypertension, dyslipidemia, diabetes mellitus type 2, chronic low back pain, glaucoma, and obesity presents with complaints of weakness over the last 3 days.  At baseline patient utilizes a walker to get around.  He normally backs his walker into his bedroom to sleep at night.  His wife reported that 4 days ago he seemed to be dragging his feet some.  Following day patient fell while in the bathroom.  His wife uses a cane and was unable to help him get up.  One of the patient's son who lives with them and the other who does not had to help get him up.  Patient reportedly fell again later on that day, but they were able to help him get back into bed.  Denied any loss of consciousness or trauma to his head during these falls.  The following day the patient just stayed in bed all day and slept.  His wife brought him his meals and things yesterday due to patient symptoms she had a televisit with his primary care provider who recommended patient come to the hospital.  He denies having any significant fever, chills, chest pain, nausea, vomiting, abdominal pain, or diarrhea.  He chronically has issues with back pain and neuropathy.  Last medication added recently had been acetazolamide for glaucoma.  ED Course: Upon admission to the emergency department patient was seen to be afebrile, respirations 12-23, and orthostatic vital signs positive.  CT scan of the head showed a suspicious age-indeterminate small vessel ischemia in the left pons.  Labs significant for CBC within normal limits, BUN 23, creatinine 1.81(baseline previously 1.1 in 2015),  glucose 191, CK 154.  Urinalysis showed no acute abnormalities.  Influenza and COVID-19 screening were negative  Assessment & Plan:   Active Problems:   AKI (acute kidney injury) (HCC)    Weakness   Dehydration   Orthostatic hypotension   Diabetes mellitus type 2 in obese (HCC)   Glaucoma  Weakness with frequent falls: Acute.  Patient presents with complaints of generalized weakness over the last 3 days prior to visit.  Initial CT imaging noted age-indeterminate small vessel ischemia of the left pons. -MRI brain personally reviewed, neg for acute infarct -Staff reports pt is markedly weak, difficult to stand and buckling down to bed. Family reports this is an acute decompensation for pt over his baseline -PT/OT recs for SNF, TOC continues to follow for placeement  Acute kidney injury secondary to dehydration/overdiuresis:  -Patient baseline creatinine previously noted to be around 1.1, but presents with creatinine up to 1.81 with BUN 23.  Urinalysis did not show any acute abnormalities patient had been ordered initially a 500 mL bolus of IV fluids and then a second 500 mL bolus of IV fluids.   -Concerns for dehydration given recent addition of Diamox -Continue normal saline IV fluids at 100 mL/h -Hold nephrotoxic agents -Cr further improved to 1.26  Orthostatic hypotension: Acute.   -On admission patient's blood pressure dropped from patient with history of hypertension on furosemide 20 mg daily, telmisartan 80 mg daily, amlodipine 5 mg daily, and acetazolamide 500 mg daily.  -Hold home blood pressure medications at time of presentation -Had been continued on IVF hydration  Diabetes mellitus type 2 with neuropathy: Home medications include Trulicity and Metformin 500 mg twice daily. -Hypoglycemic protocol -Check hemoglobin A1c -  Continuing to hold Metformin and Trulicity while in hospital -Cont with SSI coverage as needed  Glaucoma: Patient's wife notes that Diamox was added for patient's glaucoma -Continue home regimen of eyedrops  Toxic metabolic encephalopathy -Mentation seems appropriate this AM, conversing appropriately -Staff reports increased confusion  towards dusk. Question sun-downing.  -Recommend outpatient eval for possible undiagnosed underlying dementia  Hypokalemia -Cont to replace -Repeat lytes in AM  DVT prophylaxis: Lovenox subq Code Status: Full Family Communication: Pt in room, Pt's wife over phone on 12/31  Status is: Inpatient  The patient will require care spanning > 2 midnights and should be moved to inpatient because: IV treatments appropriate due to intensity of illness or inability to take PO  Dispo: The patient is from: Home              Anticipated d/c is to: SNF              Anticipated d/c date is: 3 days              Patient currently is not medically stable to d/c.   Consultants:     Procedures:     Antimicrobials: Anti-infectives (From admission, onward)   None      Subjective: Looking forward to going home  Objective: Vitals:   01/09/20 2356 01/10/20 0348 01/10/20 0757 01/10/20 1240  BP: (!) 160/70 (!) 142/70 (!) 145/69 (!) 149/89  Pulse: 68 72 66 60  Resp: 19 18 18 20   Temp: 98.3 F (36.8 C) 98.6 F (37 C) 98.4 F (36.9 C) 98.3 F (36.8 C)  TempSrc: Oral Oral Oral Oral  SpO2: 94% 92% 94% 93%  Weight:      Height:        Intake/Output Summary (Last 24 hours) at 01/10/2020 1507 Last data filed at 01/10/2020 [id number] Gross per 24 hour  Intake 3754.93 ml  Output 1150 ml  Net 2604.93 ml   Filed Weights   01/06/20 1851  Weight: 108.8 kg    Examination: General exam: Conversant, in no acute distress Respiratory system: normal chest rise, clear, no audible wheezing Cardiovascular system: regular rhythm, s1-s2 Gastrointestinal system: Nondistended, nontender, pos BS Central nervous system: No seizures, no tremors Extremities: No cyanosis, no joint deformities Skin: No rashes, no pallor Psychiatry: Affect normal // no auditory hallucinations   Data Reviewed: I have personally reviewed following labs and imaging studies  CBC: Recent Labs  Lab 01/05/20 1751 01/07/20 0143  01/09/20 0033  WBC 9.1 7.1 7.5  HGB 13.5 11.9* 11.3*  HCT 42.6 34.3* 32.5*  MCV 95.9 90.7 91.5  PLT 271 218 [id number]   Basic Metabolic Panel: Recent Labs  Lab 01/05/20 1751 01/07/20 0143 01/08/20 0102 01/08/20 0803 01/09/20 0033 01/10/20 0355  NA 139 140 140  --  139 138  K 3.5 3.2* 3.2*  --  4.7 3.5  CL 106 112* 113*  --  114* 113*  CO2 23 20* 16*  --  16* 17*  GLUCOSE 191* 138* 120*  --  119* 114*  BUN 23 22 14   --  13 9  CREATININE 1.81* 1.75* 1.43*  --  1.42* 1.26*  CALCIUM 9.1 8.9 8.7*  --  8.7* 8.7*  MG  --   --   --  1.9  --   --    GFR: Estimated Creatinine Clearance: 56.8 mL/min (A) (by C-G formula based on SCr of 1.26 mg/dL (H)). Liver Function Tests: Recent Labs  Lab 01/08/20 0102 01/09/20 0033 01/10/20 0355  AST 16 43* 19  ALT 15 14 15   ALKPHOS 53 50 51  BILITOT 0.6 1.7* 0.7  PROT 6.0* 5.6* 5.6*  ALBUMIN 2.9* 2.8* 2.7*   No results for input(s): LIPASE, AMYLASE in the last 168 hours. No results for input(s): AMMONIA in the last 168 hours. Coagulation Profile: No results for input(s): INR, PROTIME in the last 168 hours. Cardiac Enzymes: Recent Labs  Lab 01/06/20 0743  CKTOTAL 154   BNP (last 3 results) No results for input(s): PROBNP in the last 8760 hours. HbA1C: No results for input(s): HGBA1C in the last 72 hours. CBG: Recent Labs  Lab 01/09/20 1207 01/09/20 1630 01/09/20 2131 01/10/20 0618 01/10/20 1255  GLUCAP 112* 126* 104* 95 110*   Lipid Profile: No results for input(s): CHOL, HDL, LDLCALC, TRIG, CHOLHDL, LDLDIRECT in the last 72 hours. Thyroid Function Tests: No results for input(s): TSH, T4TOTAL, FREET4, T3FREE, THYROIDAB in the last 72 hours. Anemia Panel: No results for input(s): VITAMINB12, FOLATE, FERRITIN, TIBC, IRON, RETICCTPCT in the last 72 hours. Sepsis Labs: No results for input(s): PROCALCITON, LATICACIDVEN in the last 168 hours.  Recent Results (from the past 240 hour(s))  Resp Panel by RT-PCR (Flu A&B, Covid)  Nasopharyngeal Swab     Status: None   Collection Time: 01/06/20  7:43 AM   Specimen: Nasopharyngeal Swab; Nasopharyngeal(NP) swabs in vial transport medium  Result Value Ref Range Status   SARS Coronavirus 2 by RT PCR NEGATIVE NEGATIVE Final    Comment: (NOTE) SARS-CoV-2 target nucleic acids are NOT DETECTED.  The SARS-CoV-2 RNA is generally detectable in upper respiratory specimens during the acute phase of infection. The lowest concentration of SARS-CoV-2 viral copies this assay can detect is 138 copies/mL. A negative result does not preclude SARS-Cov-2 infection and should not be used as the sole basis for treatment or other patient management decisions. A negative result may occur with  improper specimen collection/handling, submission of specimen other than nasopharyngeal swab, presence of viral mutation(s) within the areas targeted by this assay, and inadequate number of viral copies(<138 copies/mL). A negative result must be combined with clinical observations, patient history, and epidemiological information. The expected result is Negative.  Fact Sheet for Patients:  EntrepreneurPulse.com.au  Fact Sheet for Healthcare Providers:  IncredibleEmployment.be  This test is no t yet approved or cleared by the [place] FDA and  has been authorized for detection and/or diagnosis of SARS-CoV-2 by FDA under an Emergency Use Authorization (EUA). This EUA will remain  in effect (meaning this test can be used) for the duration of the COVID-19 declaration under Section 564(b)(1) of the Act, 21 U.S.C.section 360bbb-3(b)(1), unless the authorization is terminated  or revoked sooner.       Influenza A by PCR NEGATIVE NEGATIVE Final   Influenza B by PCR NEGATIVE NEGATIVE Final    Comment: (NOTE) The Xpert Xpress SARS-CoV-2/FLU/RSV plus assay is intended as an aid in the diagnosis of influenza from Nasopharyngeal swab specimens and should not be  used as a sole basis for treatment. Nasal washings and aspirates are unacceptable for Xpert Xpress SARS-CoV-2/FLU/RSV testing.  Fact Sheet for Patients: EntrepreneurPulse.com.au  Fact Sheet for Healthcare Providers: IncredibleEmployment.be  This test is not yet approved or cleared by the [place] FDA and has been authorized for detection and/or diagnosis of SARS-CoV-2 by FDA under an Emergency Use Authorization (EUA). This EUA will remain in effect (meaning this test can be used) for the duration of the COVID-19 declaration under Section 564(b)(1) of the Act,  21 U.S.C. section 360bbb-3(b)(1), unless the authorization is terminated or revoked.  Performed at [facility], [place] [address], [place], [place] [address]      Radiology Studies: No results found.  Scheduled Meds: . brimonidine  1 drop Right Eye BID  . DULoxetine  60 mg Oral Daily  . enoxaparin (LOVENOX) injection  40 mg Subcutaneous Q24H  . gabapentin  300 mg Oral QHS  . gabapentin  600 mg Oral TID WC  . insulin aspart  0-15 Units Subcutaneous TID WC  . insulin aspart  0-5 Units Subcutaneous QHS  . Netarsudil-Latanoprost  1 drop Right Eye QHS  . sodium chloride flush  3 mL Intravenous Q12H  . timolol  1 drop Right Eye BID   Continuous Infusions: . sodium chloride 100 mL/hr at 01/10/20 0604     LOS: 3 days   Marylu Lund, MD [facility] Pager On Amion  If 7PM-7AM, please contact night-coverage 01/10/2020, 3:07 PM

## 2020-01-11 DIAGNOSIS — R531 Weakness: Secondary | ICD-10-CM | POA: Diagnosis not present

## 2020-01-11 DIAGNOSIS — E860 Dehydration: Secondary | ICD-10-CM | POA: Diagnosis not present

## 2020-01-11 LAB — GLUCOSE, CAPILLARY
Glucose-Capillary: 113 mg/dL — ABNORMAL HIGH (ref 70–99)
Glucose-Capillary: 127 mg/dL — ABNORMAL HIGH (ref 70–99)
Glucose-Capillary: 153 mg/dL — ABNORMAL HIGH (ref 70–99)
Glucose-Capillary: 112 mg/dL — ABNORMAL HIGH (ref 70–99)

## 2020-01-11 LAB — COMPREHENSIVE METABOLIC PANEL
ALT: 18 U/L (ref 0–44)
AST: 20 U/L (ref 15–41)
Albumin: 2.6 g/dL — ABNORMAL LOW (ref 3.5–5.0)
Alkaline Phosphatase: 50 U/L (ref 38–126)
Anion gap: 8 (ref 5–15)
BUN: 8 mg/dL (ref 8–23)
CO2: 18 mmol/L — ABNORMAL LOW (ref 22–32)
Calcium: 8.5 mg/dL — ABNORMAL LOW (ref 8.9–10.3)
Chloride: 111 mmol/L (ref 98–111)
Creatinine, Ser: 1.24 mg/dL (ref 0.61–1.24)
GFR, Estimated: 59 mL/min — ABNORMAL LOW (ref >60)
Glucose, Bld: 138 mg/dL — ABNORMAL HIGH (ref 70–99)
Potassium: 3.3 mmol/L — ABNORMAL LOW (ref 3.5–5.1)
Sodium: 137 mmol/L (ref 135–145)
Total Bilirubin: 1.2 mg/dL (ref 0.3–1.2)
Total Protein: 5.5 g/dL — ABNORMAL LOW (ref 6.5–8.1)

## 2020-01-11 LAB — MAGNESIUM: Magnesium: 1.9 mg/dL (ref 1.7–2.4)

## 2020-01-11 MED ORDER — POTASSIUM CHLORIDE CRYS ER 20 MEQ PO TBCR
40.0000 meq | EXTENDED_RELEASE_TABLET | Freq: Two times a day (BID) | ORAL | Status: AC
  Administered 2020-01-11 (×2): 40 meq via ORAL
  Filled 2020-01-11 (×2): qty 2

## 2020-01-11 NOTE — Progress Notes (Signed)
PROGRESS NOTE    [name]  [id number] DOB: 1940-11-16 DOA: 01/05/2020 PCP: Lavone Orn, MD    Brief Narrative:  80 y.o. male with medical history significant of hypertension, dyslipidemia, diabetes mellitus type 2, chronic low back pain, glaucoma, and obesity presents with complaints of weakness over the last 3 days.  At baseline patient utilizes a walker to get around.  He normally backs his walker into his bedroom to sleep at night.  His wife reported that 4 days ago he seemed to be dragging his feet some.  Following day patient fell while in the bathroom.  His wife uses a cane and was unable to help him get up.  One of the patient's son who lives with them and the other who does not had to help get him up.  Patient reportedly fell again later on that day, but they were able to help him get back into bed.  Denied any loss of consciousness or trauma to his head during these falls.  The following day the patient just stayed in bed all day and slept.  His wife brought him his meals and things yesterday due to patient symptoms she had a televisit with his primary care provider who recommended patient come to the hospital.  He denies having any significant fever, chills, chest pain, nausea, vomiting, abdominal pain, or diarrhea.  He chronically has issues with back pain and neuropathy.  Last medication added recently had been acetazolamide for glaucoma.  ED Course: Upon admission to the emergency department patient was seen to be afebrile, respirations 12-23, and orthostatic vital signs positive.  CT scan of the head showed a suspicious age-indeterminate small vessel ischemia in the left pons.  Labs significant for CBC within normal limits, BUN 23, creatinine 1.81(baseline previously 1.1 in 2015),  glucose 191, CK 154.  Urinalysis showed no acute abnormalities.  Influenza and COVID-19 screening were negative  Assessment & Plan:   Active Problems:   AKI (acute kidney injury) ([place])    Weakness   Dehydration   Orthostatic hypotension   Diabetes mellitus type 2 in obese (HCC)   Glaucoma  Weakness with frequent falls: Acute.  Patient presents with complaints of generalized weakness over the last 3 days prior to visit.  Initial CT imaging noted age-indeterminate small vessel ischemia of the left pons. -MRI brain personally reviewed, neg for acute infarct -Staff reports pt is markedly weak, difficult to stand and buckling down to bed. Family reports this is an acute decompensation for pt over his baseline -PT/OT recs for SNF, TOC is following for placement  Acute kidney injury secondary to dehydration/overdiuresis:  -Patient baseline creatinine previously noted to be around 1.1, but presents with creatinine up to 1.81 with BUN 23.  Urinalysis did not show any acute abnormalities patient had been ordered initially a 500 mL bolus of IV fluids and then a second 500 mL bolus of IV fluids.   -Concerns for dehydration given recent addition of Diamox -Continue normal saline IV fluids at 75 mL/h -Hold nephrotoxic agents -Cr further improved to 1.24  Orthostatic hypotension: Acute.   -On admission patient's blood pressure dropped from patient with history of hypertension on furosemide 20 mg daily, telmisartan 80 mg daily, amlodipine 5 mg daily, and acetazolamide 500 mg daily.  -Hold home blood pressure medications at time of presentation -Had been continued on IVF hydration  Diabetes mellitus type 2 with neuropathy: Home medications include Trulicity and Metformin 500 mg twice daily. -Hypoglycemic protocol -hgb a1c of 6.9 -  Continuing to hold Metformin and Trulicity while in hospital -Cont with SSI coverage as needed  Glaucoma: Patient's wife notes that Diamox was added for patient's glaucoma -Continue home regimen of eyedrops  Toxic metabolic encephalopathy -Mentation seems appropriate this AM, conversing appropriately -Staff reports increased confusion towards dusk.  Question sun-downing.  -Recommend outpatient eval for possible undiagnosed underlying dementia -Currently conversing appropriately  Hypokalemia -Cont to replace as needed -Repeat lytes in AM  DVT prophylaxis: Lovenox subq Code Status: Full Family Communication: Pt in room, Pt's wife over phone on 12/31  Status is: Inpatient  The patient will require care spanning > 2 midnights and should be moved to inpatient because: IV treatments appropriate due to intensity of illness or inability to take PO  Dispo: The patient is from: Home              Anticipated d/c is to: SNF              Anticipated d/c date is: 3 days              Patient currently is not medically stable to d/c.   Consultants:     Procedures:     Antimicrobials: Anti-infectives (From admission, onward)   None      Subjective: Eager to go home soon  Objective: Vitals:   01/11/20 0032 01/11/20 0418 01/11/20 0802 01/11/20 1117  BP: 133/76 (!) 144/64 (!) 150/78 (!) 146/71  Pulse: 66 64 60 63  Resp: 20 20 18 19   Temp: 99 F (37.2 C) 98.4 F (36.9 C) 99.1 F (37.3 C) 98.9 F (37.2 C)  TempSrc: Oral Oral Oral Oral  SpO2: 98% 98% 97% 94%  Weight:      Height:        Intake/Output Summary (Last 24 hours) at 01/11/2020 1600 Last data filed at 01/11/2020 [id number] Gross per 24 hour  Intake 1440 ml  Output 1000 ml  Net 440 ml   Filed Weights   01/06/20 1851  Weight: 108.8 kg    Examination: General exam: Awake, laying in bed, in nad Respiratory system: Normal respiratory effort, no wheezing Cardiovascular system: regular rate, s1, s2 Gastrointestinal system: Soft, nondistended, positive BS Central nervous system: CN2-12 grossly intact, strength intact Extremities: Perfused, no clubbing Skin: Normal skin turgor, no notable skin lesions seen Psychiatry: Mood normal // no visual hallucinations   Data Reviewed: I have personally reviewed following labs and imaging studies  CBC: Recent Labs  Lab  01/05/20 1751 01/07/20 0143 01/09/20 0033  WBC 9.1 7.1 7.5  HGB 13.5 11.9* 11.3*  HCT 42.6 34.3* 32.5*  MCV 95.9 90.7 91.5  PLT 271 218 [id number]   Basic Metabolic Panel: Recent Labs  Lab 01/07/20 0143 01/08/20 0102 01/08/20 0803 01/09/20 0033 01/10/20 0355 01/11/20 0311  NA 140 140  --  139 138 137  K 3.2* 3.2*  --  4.7 3.5 3.3*  CL 112* 113*  --  114* 113* 111  CO2 20* 16*  --  16* 17* 18*  GLUCOSE 138* 120*  --  119* 114* 138*  BUN 22 14  --  13 9 8   CREATININE 1.75* 1.43*  --  1.42* 1.26* 1.24  CALCIUM 8.9 8.7*  --  8.7* 8.7* 8.5*  MG  --   --  1.9  --   --  1.9   GFR: Estimated Creatinine Clearance: 57.7 mL/min (by C-G formula based on SCr of 1.24 mg/dL). Liver Function Tests: Recent Labs  Lab 01/08/20 0102 01/09/20 0033  01/10/20 0355 01/11/20 0311  AST 16 43* 19 20  ALT 15 14 15 18   ALKPHOS 53 50 51 50  BILITOT 0.6 1.7* 0.7 1.2  PROT 6.0* 5.6* 5.6* 5.5*  ALBUMIN 2.9* 2.8* 2.7* 2.6*   No results for input(s): LIPASE, AMYLASE in the last 168 hours. No results for input(s): AMMONIA in the last 168 hours. Coagulation Profile: No results for input(s): INR, PROTIME in the last 168 hours. Cardiac Enzymes: Recent Labs  Lab 01/06/20 0743  CKTOTAL 154   BNP (last 3 results) No results for input(s): PROBNP in the last 8760 hours. HbA1C: No results for input(s): HGBA1C in the last 72 hours. CBG: Recent Labs  Lab 01/10/20 1255 01/10/20 1618 01/10/20 2116 01/11/20 0616 01/11/20 1145  GLUCAP 110* 159* 162* 113* 127*   Lipid Profile: No results for input(s): CHOL, HDL, LDLCALC, TRIG, CHOLHDL, LDLDIRECT in the last 72 hours. Thyroid Function Tests: No results for input(s): TSH, T4TOTAL, FREET4, T3FREE, THYROIDAB in the last 72 hours. Anemia Panel: No results for input(s): VITAMINB12, FOLATE, FERRITIN, TIBC, IRON, RETICCTPCT in the last 72 hours. Sepsis Labs: No results for input(s): PROCALCITON, LATICACIDVEN in the last 168 hours.  Recent Results (from the  past 240 hour(s))  Resp Panel by RT-PCR (Flu A&B, Covid) Nasopharyngeal Swab     Status: None   Collection Time: 01/06/20  7:43 AM   Specimen: Nasopharyngeal Swab; Nasopharyngeal(NP) swabs in vial transport medium  Result Value Ref Range Status   SARS Coronavirus 2 by RT PCR NEGATIVE NEGATIVE Final    Comment: (NOTE) SARS-CoV-2 target nucleic acids are NOT DETECTED.  The SARS-CoV-2 RNA is generally detectable in upper respiratory specimens during the acute phase of infection. The lowest concentration of SARS-CoV-2 viral copies this assay can detect is 138 copies/mL. A negative result does not preclude SARS-Cov-2 infection and should not be used as the sole basis for treatment or other patient management decisions. A negative result may occur with  improper specimen collection/handling, submission of specimen other than nasopharyngeal swab, presence of viral mutation(s) within the areas targeted by this assay, and inadequate number of viral copies(<138 copies/mL). A negative result must be combined with clinical observations, patient history, and epidemiological information. The expected result is Negative.  Fact Sheet for Patients:  01/08/20  Fact Sheet for Healthcare Providers:  BloggerCourse.com  This test is no t yet approved or cleared by the [employer] FDA and  has been authorized for detection and/or diagnosis of SARS-CoV-2 by FDA under an Emergency Use Authorization (EUA). This EUA will remain  in effect (meaning this test can be used) for the duration of the COVID-19 declaration under Section 564(b)(1) of the Act, 21 U.S.C.section 360bbb-3(b)(1), unless the authorization is terminated  or revoked sooner.       Influenza A by PCR NEGATIVE NEGATIVE Final   Influenza B by PCR NEGATIVE NEGATIVE Final    Comment: (NOTE) The Xpert Xpress SARS-CoV-2/FLU/RSV plus assay is intended as an aid in the diagnosis of  influenza from Nasopharyngeal swab specimens and should not be used as a sole basis for treatment. Nasal washings and aspirates are unacceptable for Xpert Xpress SARS-CoV-2/FLU/RSV testing.  Fact Sheet for Patients: [place]  Fact Sheet for Healthcare Providers: BloggerCourse.com  This test is not yet approved or cleared by the [employer] FDA and has been authorized for detection and/or diagnosis of SARS-CoV-2 by FDA under an Emergency Use Authorization (EUA). This EUA will remain in effect (meaning this test can be used) for the  duration of the COVID-19 declaration under Section 564(b)(1) of the Act, 21 U.S.C. section 360bbb-3(b)(1), unless the authorization is terminated or revoked.  Performed at [facility], [place] [address], [place], [place] [address]      Radiology Studies: No results found.  Scheduled Meds: . brimonidine  1 drop Right Eye BID  . DULoxetine  60 mg Oral Daily  . enoxaparin (LOVENOX) injection  40 mg Subcutaneous Q24H  . gabapentin  300 mg Oral QHS  . gabapentin  600 mg Oral TID WC  . insulin aspart  0-15 Units Subcutaneous TID WC  . insulin aspart  0-5 Units Subcutaneous QHS  . Netarsudil-Latanoprost  1 drop Right Eye QHS  . potassium chloride  40 mEq Oral BID  . sodium chloride flush  3 mL Intravenous Q12H  . timolol  1 drop Right Eye BID   Continuous Infusions: . sodium chloride 75 mL/hr at 01/11/20 0802     LOS: 4 days   Marylu Lund, MD [facility] Pager On Amion  If 7PM-7AM, please contact night-coverage 01/11/2020, 4:00 PM

## 2020-01-11 NOTE — TOC Progression Note (Addendum)
Transition of Care [facility]) - Progression Note    Patient Details  Name: [name] MRN: [id number] Date of Birth: September 26, 1940  Transition of Care [facility]) CM/SW Contact  Patrice [place], [place] Phone Number: [phone] 01/11/2020, 8:47 AM  Clinical Narrative:     CSW returned call from [facility] to follow up with bed offer. CSW had to leave a message.  10:25a.m. [facility] confirmed bed at [facility] therefore insurance has been started for 01/11/20.  Patient's wife updated about preferred choice confirming bed.  [facility] authorization started- reference # [id number]. Clinical faxed.  TOC team will continue to assist with discharge planning needs.   Expected Discharge Plan: Skilled Nursing Facility Barriers to Discharge: Insurance Authorization,SNF Pending bed offer  Expected Discharge Plan and Services Expected Discharge Plan: Skilled Nursing Facility In-house Referral: Clinical Social Work   Post Acute Care Choice: Skilled Nursing Facility Living arrangements for the past 2 months: Single Family Home                                       Social Determinants of Health (SDOH) Interventions    Readmission Risk Interventions No flowsheet data found.

## 2020-01-12 DIAGNOSIS — R531 Weakness: Secondary | ICD-10-CM | POA: Diagnosis not present

## 2020-01-12 DIAGNOSIS — E1169 Type 2 diabetes mellitus with other specified complication: Secondary | ICD-10-CM | POA: Diagnosis not present

## 2020-01-12 DIAGNOSIS — E669 Obesity, unspecified: Secondary | ICD-10-CM | POA: Diagnosis not present

## 2020-01-12 LAB — COMPREHENSIVE METABOLIC PANEL
ALT: 20 U/L (ref 0–44)
AST: 23 U/L (ref 15–41)
Albumin: 2.5 g/dL — ABNORMAL LOW (ref 3.5–5.0)
Alkaline Phosphatase: 52 U/L (ref 38–126)
Anion gap: 8 (ref 5–15)
BUN: 9 mg/dL (ref 8–23)
CO2: 18 mmol/L — ABNORMAL LOW (ref 22–32)
Calcium: 8.6 mg/dL — ABNORMAL LOW (ref 8.9–10.3)
Chloride: 113 mmol/L — ABNORMAL HIGH (ref 98–111)
Creatinine, Ser: 1.23 mg/dL (ref 0.61–1.24)
GFR, Estimated: 59 mL/min — ABNORMAL LOW (ref >60)
Glucose, Bld: 136 mg/dL — ABNORMAL HIGH (ref 70–99)
Potassium: 3.8 mmol/L (ref 3.5–5.1)
Sodium: 139 mmol/L (ref 135–145)
Total Bilirubin: 0.8 mg/dL (ref 0.3–1.2)
Total Protein: 5.6 g/dL — ABNORMAL LOW (ref 6.5–8.1)

## 2020-01-12 LAB — GLUCOSE, CAPILLARY
Glucose-Capillary: 120 mg/dL — ABNORMAL HIGH (ref 70–99)
Glucose-Capillary: 109 mg/dL — ABNORMAL HIGH (ref 70–99)
Glucose-Capillary: 111 mg/dL — ABNORMAL HIGH (ref 70–99)
Glucose-Capillary: 137 mg/dL — ABNORMAL HIGH (ref 70–99)

## 2020-01-12 MED ORDER — ENOXAPARIN SODIUM 80 MG/0.8ML ~~LOC~~ SOLN
80.0000 mg | Freq: Once | SUBCUTANEOUS | Status: AC
  Administered 2020-01-12: 80 mg via SUBCUTANEOUS
  Filled 2020-01-12: qty 0.8

## 2020-01-12 MED ORDER — ENOXAPARIN SODIUM 120 MG/0.8ML ~~LOC~~ SOLN
110.0000 mg | Freq: Two times a day (BID) | SUBCUTANEOUS | Status: DC
  Administered 2020-01-13: 110 mg via SUBCUTANEOUS
  Filled 2020-01-12 (×2): qty 0.74

## 2020-01-12 MED ORDER — LABETALOL HCL 5 MG/ML IV SOLN: 5.0000 mg | INTRAVENOUS | Status: DC | PRN

## 2020-01-12 NOTE — Care Management Important Message (Signed)
Important Message  Patient Details  Name: [name] MRN: [id number] Date of Birth: [birth year]   Medicare Important Message Given:  Yes     Renie Ora 01/12/2020, 10:14 AM

## 2020-01-12 NOTE — Progress Notes (Signed)
   01/12/20 0915  Vitals  BP (!) 141/69  MAP (mmHg) 89  BP Location Right Arm  BP Method Automatic  Patient Position (if appropriate) Lying  Pulse Rate 83  Pulse Rate Source Monitor  ECG Heart Rate 81  Resp 20    Pt had run of Afib with RVR sustaining for 3 minutes, was unable to catch on EKG pt had already self converted back into SR in the 70s.  MD notified and orders received for PRN medication.

## 2020-01-12 NOTE — Progress Notes (Signed)
Physical Therapy Treatment Patient Details Name: [name] MRN: [id number] DOB: 1940/12/14 Today's Date: 01/12/2020    History of Present Illness 80 y.o. male with medical history significant of hypertension, dyslipidemia, diabetes mellitus type 2, chronic low back pain, glaucoma, and obesity presents with complaints of weakness over the last 3 days.CT scan of head showed age indeterminate small vessel ischemia of the L pons, MRI with similar results, but no acute infarct, AKI due to dehydration, orthostatic hypotension.    PT Comments    Pt on phone with wife on entry, discussing how he wants to go home, pt fixated on [employer] notification which states he can leave at any time. PT spoke with pt's wife on phone and she is in agreement that pt needs to go to SNF prior to coming home and wife reports she will work on convincing husband when she arrives later in the afternoon. Pt is very determined to show PT how well he can walk. Pt requires min A and maximal cuing to stand from recliner. Pt able to ambulate with min A initially but with advancement requires modA for steadying. Pt requires close chair follow, due to increased weakness at end of bout of ambulation requiring sitting. PT continues to recommend SNF level rehab prior to return to home. PT will continue to follow pt acutely.    Follow Up Recommendations  SNF     Equipment Recommendations  Wheelchair (measurements PT);Wheelchair cushion (measurements PT);3in1 (PT)       Precautions / Restrictions Precautions Precautions: Fall Precaution Comments: chart reports h/o recent falls Restrictions Weight Bearing Restrictions: No    Mobility  Bed Mobility               General bed mobility comments: OOB in recliner  Transfers Overall transfer level: Needs assistance Equipment used: Rolling walker (2 wheeled) Transfers: Sit to/from [employer] Sit to Stand: Min assist;+2 safety/equipment Stand pivot  transfers: Min assist       General transfer comment: min A for power up and steadying from recliner, maximal cuing for hand placement on recliner  Ambulation/Gait Ambulation/Gait assistance: Min assist;Mod assist Gait Distance (Feet): 30 Feet (1x10 1x30) Assistive device: Rolling walker (2 wheeled) Gait Pattern/deviations: Step-through pattern;Shuffle;Antalgic;Trunk flexed Gait velocity: slowed Gait velocity interpretation: <1.31 ft/sec, indicative of household ambulator General Gait Details: minA progressing to modA for steadying due to increasing instability with 2 bouts of ambulation, pt with use of momentum to advance gait and requires constant cuing for proximity to RW, pt with 4/4 DoE by end of ambulation         Balance Overall balance assessment: Needs assistance Sitting-balance support: Feet supported Sitting balance-Leahy Scale: Fair     Standing balance support: Bilateral upper extremity supported Standing balance-Leahy Scale: Poor Standing balance comment: needs UE support                            Cognition Arousal/Alertness: Awake/alert Behavior During Therapy: WFL for tasks assessed/performed Overall Cognitive Status: No family/caregiver present to determine baseline cognitive functioning Area of Impairment: Attention;Following commands;Safety/judgement;Problem solving                   Current Attention Level: Sustained;Alternating   Following Commands: Follows multi-step commands with increased time;Follows one step commands with increased time Safety/Judgement: Decreased awareness of safety;Decreased awareness of deficits   Problem Solving: Slow processing;Decreased initiation;Requires verbal cues;Requires tactile cues General Comments: continues to have slow processing, and  requires increased cuing for safety         General Comments General comments (skin integrity, edema, etc.): at rest HR low 60s with ambulation HR max noted 96  bpm      Pertinent Vitals/Pain Pain Assessment: Faces Faces Pain Scale: Hurts little more Pain Location: generalized Pain Descriptors / Indicators: Grimacing;Guarding Pain Intervention(s): Limited activity within patient's tolerance;Monitored during session;Repositioned           PT Goals (current goals can now be found in the care plan section) Acute Rehab PT Goals Patient Stated Goal: pt wants to go home PT Goal Formulation: With patient Time For Goal Achievement: 01/21/20 Potential to Achieve Goals: Good Progress towards PT goals: Progressing toward goals    Frequency    Min 2X/week      PT Plan Current plan remains appropriate       AM-PAC PT "6 Clicks" Mobility   Outcome Measure  Help needed turning from your back to your side while in a flat bed without using bedrails?: A Little Help needed moving from lying on your back to sitting on the side of a flat bed without using bedrails?: A Little Help needed moving to and from a bed to a chair (including a wheelchair)?: A Little Help needed standing up from a chair using your arms (e.g., wheelchair or bedside chair)?: A Little Help needed to walk in hospital room?: A Lot Help needed climbing 3-5 steps with a railing? : Total 6 Click Score: 15    End of Session Equipment Utilized During Treatment: Gait belt Activity Tolerance: Patient limited by pain;Patient limited by fatigue Patient left: in chair;with call bell/phone within reach;with chair alarm set Nurse Communication: Mobility status PT Visit Diagnosis: Muscle weakness (generalized) (M62.81);Difficulty in walking, not elsewhere classified (R26.2);Pain Pain - Right/Left: Right Pain - part of body: Leg     Time: 1601-0932 PT Time Calculation (min) (ACUTE ONLY): 22 min  Charges:  $Gait Training: 8-22 mins                     Jacqulynn Shappell B. Beverely Risen PT, DPT Acute Rehabilitation Services Pager [phone] Office [phone]    Elon Alas  [facility] 01/12/2020, 4:35 PM

## 2020-01-12 NOTE — Progress Notes (Signed)
PROGRESS NOTE    [name]  [id number] DOB: [birth year] DOA: 01/05/2020 PCP: Lavone Orn, MD    Brief Narrative:  80 y.o. male with medical history significant of hypertension, dyslipidemia, diabetes mellitus type 2, chronic low back pain, glaucoma, and obesity presents with complaints of weakness over the last 3 days.  At baseline patient utilizes a walker to get around.  He normally backs his walker into his bedroom to sleep at night.  His wife reported that 4 days ago he seemed to be dragging his feet some.  Following day patient fell while in the bathroom.  His wife uses a cane and was unable to help him get up.  One of the patient's son who lives with them and the other who does not had to help get him up.  Patient reportedly fell again later on that day, but they were able to help him get back into bed.  Denied any loss of consciousness or trauma to his head during these falls.  The following day the patient just stayed in bed all day and slept.  His wife brought him his meals and things yesterday due to patient symptoms she had a televisit with his primary care provider who recommended patient come to the hospital.  He denies having any significant fever, chills, chest pain, nausea, vomiting, abdominal pain, or diarrhea.  He chronically has issues with back pain and neuropathy.  Last medication added recently had been acetazolamide for glaucoma.  ED Course: Upon admission to the emergency department patient was seen to be afebrile, respirations 12-23, and orthostatic vital signs positive.  CT scan of the head showed a suspicious age-indeterminate small vessel ischemia in the left pons.  Labs significant for CBC within normal limits, BUN 23, creatinine 1.81(baseline previously 1.1 in 2015),  glucose 191, CK 154.  Urinalysis showed no acute abnormalities.  Influenza and COVID-19 screening were negative  Assessment & Plan:   Active Problems:   AKI (acute kidney injury) ([place])    Weakness   Dehydration   Orthostatic hypotension   Diabetes mellitus type 2 in obese (HCC)   Glaucoma  Weakness with frequent falls: Acute.  Patient presents with complaints of generalized weakness over the last 3 days prior to visit.  Initial CT imaging noted age-indeterminate small vessel ischemia of the left pons. -MRI brain personally reviewed, neg for acute infarct -Staff reports pt is markedly weak, difficult to stand and buckling down to bed. Family reports this is an acute decompensation for pt over his baseline -PT/OT recs for SNF, TOC continues to follow for possible placement  Acute kidney injury secondary to dehydration/overdiuresis:  -Patient baseline creatinine previously noted to be around 1.1, but presents with creatinine up to 1.81 with BUN 23.  Urinalysis did not show any acute abnormalities patient had been ordered initially a 500 mL bolus of IV fluids and then a second 500 mL bolus of IV fluids.   -Concerns for dehydration given recent addition of Diamox -Continue normal saline IV fluids at 75 mL/h -Hold nephrotoxic agents -Cr further improved to 1.23 today  Orthostatic hypotension: Acute.   -On admission patient's blood pressure dropped from patient with history of hypertension on furosemide 20 mg daily, telmisartan 80 mg daily, amlodipine 5 mg daily, and acetazolamide 500 mg daily.  -Hold home blood pressure medications at time of presentation -Clinically improved with NS hydration  Diabetes mellitus type 2 with neuropathy: Home medications include Trulicity and Metformin 500 mg twice daily. -Hypoglycemic protocol -hgb a1c  of 6.9 -Continuing to hold Metformin and Trulicity while in hospital -Cont with SSI as pt needs  Glaucoma: Patient's wife notes that Diamox was added for patient's glaucoma -Continue home regimen of eyedrops  Toxic metabolic encephalopathy -Mentation seems appropriate this AM, conversing appropriately -Staff reports increased confusion  towards dusk. Question sun-downing.  -Recommend outpatient eval for possible undiagnosed underlying dementia -Currently conversing appropriately  Hypokalemia -Cont to replace as needed -Recheck lytes in AM  Paroxysmal Afib -New diagnosis this visit -Tele strip reviewed with runs of afib noted -Chart reviewed. Over the summer, pt was referred to Cardiology for tachycardia but was found not to be tachycardic in office -Suspect paroxysmal afib, at present rate controlled.  -CHADS-VASc of at least 6, thus would benefit from anticoagulation -Will start on therapeutic lovenox for now, likely transition to eliquis if pt tolerates anticoagulation -Recent 2d echo was performed in 7/21, reviewed. Normal LVEF  DVT prophylaxis: Lovenox subq Code Status: Full Family Communication: Pt in room, Pt's wife over phone on  1/2  Status is: Inpatient  The patient will require care spanning > 2 midnights and should be moved to inpatient because: Unsafe d/c plan  Dispo: The patient is from: Home              Anticipated d/c is to: SNF              Anticipated d/c date is: 3 days              Patient currently is not medically stable to d/c.   Consultants:     Procedures:     Antimicrobials: Anti-infectives (From admission, onward)   None      Subjective: Asking to go home soon  Objective: Vitals:   01/12/20 0406 01/12/20 0844 01/12/20 0915 01/12/20 1218  BP: (!) 158/70 (!) 153/63 (!) 141/69 (!) 147/71  Pulse: 60 73 83 67  Resp: 20 20 20 20   Temp: 98.9 F (37.2 C) 98.5 F (36.9 C)  98.1 F (36.7 C)  TempSrc: Oral Oral  Oral  SpO2: 94% 91%  98%  Weight:      Height:        Intake/Output Summary (Last 24 hours) at 01/12/2020 1539 Last data filed at 01/12/2020 1500 Gross per 24 hour  Intake 2329.29 ml  Output 902 ml  Net 1427.29 ml   Filed Weights   01/06/20 1851  Weight: 108.8 kg    Examination: General exam: Conversant, in no acute distress Respiratory system: normal  chest rise, clear, no audible wheezing Cardiovascular system: regular rhythm, s1-s2 Gastrointestinal system: Nondistended, nontender, pos BS Central nervous system: No seizures, no tremors Extremities: No cyanosis, no joint deformities Skin: No rashes, no pallor Psychiatry: Affect normal // no auditory hallucinations   Data Reviewed: I have personally reviewed following labs and imaging studies  CBC: Recent Labs  Lab 01/05/20 1751 01/07/20 0143 01/09/20 0033  WBC 9.1 7.1 7.5  HGB 13.5 11.9* 11.3*  HCT 42.6 34.3* 32.5*  MCV 95.9 90.7 91.5  PLT 271 218 198   Basic Metabolic Panel: Recent Labs  Lab 01/08/20 0102 01/08/20 0803 01/09/20 0033 01/10/20 0355 01/11/20 0311 01/12/20 0132  NA 140  --  139 138 137 139  K 3.2*  --  4.7 3.5 3.3* 3.8  CL 113*  --  114* 113* 111 113*  CO2 16*  --  16* 17* 18* 18*  GLUCOSE 120*  --  119* 114* 138* 136*  BUN 14  --  13  9 8 9   CREATININE 1.43*  --  1.42* 1.26* 1.24 1.23  CALCIUM 8.7*  --  8.7* 8.7* 8.5* 8.6*  MG  --  1.9  --   --  1.9  --    GFR: Estimated Creatinine Clearance: 58.2 mL/min (by C-G formula based on SCr of 1.23 mg/dL). Liver Function Tests: Recent Labs  Lab 01/08/20 0102 01/09/20 0033 01/10/20 0355 01/11/20 0311 01/12/20 0132  AST 16 43* 19 20 23   ALT 15 14 15 18 20   ALKPHOS 53 50 51 50 52  BILITOT 0.6 1.7* 0.7 1.2 0.8  PROT 6.0* 5.6* 5.6* 5.5* 5.6*  ALBUMIN 2.9* 2.8* 2.7* 2.6* 2.5*   No results for input(s): LIPASE, AMYLASE in the last 168 hours. No results for input(s): AMMONIA in the last 168 hours. Coagulation Profile: No results for input(s): INR, PROTIME in the last 168 hours. Cardiac Enzymes: Recent Labs  Lab 01/06/20 0743  CKTOTAL 154   BNP (last 3 results) No results for input(s): PROBNP in the last 8760 hours. HbA1C: No results for input(s): HGBA1C in the last 72 hours. CBG: Recent Labs  Lab 01/11/20 1145 01/11/20 1658 01/11/20 2106 01/12/20 0623 01/12/20 1215  GLUCAP 127* 153*  112* 120* 109*   Lipid Profile: No results for input(s): CHOL, HDL, LDLCALC, TRIG, CHOLHDL, LDLDIRECT in the last 72 hours. Thyroid Function Tests: No results for input(s): TSH, T4TOTAL, FREET4, T3FREE, THYROIDAB in the last 72 hours. Anemia Panel: No results for input(s): VITAMINB12, FOLATE, FERRITIN, TIBC, IRON, RETICCTPCT in the last 72 hours. Sepsis Labs: No results for input(s): PROCALCITON, LATICACIDVEN in the last 168 hours.  Recent Results (from the past 240 hour(s))  Resp Panel by RT-PCR (Flu A&B, Covid) Nasopharyngeal Swab     Status: None   Collection Time: 01/06/20  7:43 AM   Specimen: Nasopharyngeal Swab; Nasopharyngeal(NP) swabs in vial transport medium  Result Value Ref Range Status   SARS Coronavirus 2 by RT PCR NEGATIVE NEGATIVE Final    Comment: (NOTE) SARS-CoV-2 target nucleic acids are NOT DETECTED.  The SARS-CoV-2 RNA is generally detectable in upper respiratory specimens during the acute phase of infection. The lowest concentration of SARS-CoV-2 viral copies this assay can detect is 138 copies/mL. A negative result does not preclude SARS-Cov-2 infection and should not be used as the sole basis for treatment or other patient management decisions. A negative result may occur with  improper specimen collection/handling, submission of specimen other than nasopharyngeal swab, presence of viral mutation(s) within the areas targeted by this assay, and inadequate number of viral copies(<138 copies/mL). A negative result must be combined with clinical observations, patient history, and epidemiological information. The expected result is Negative.  Fact Sheet for Patients:  EntrepreneurPulse.com.au  Fact Sheet for Healthcare Providers:  IncredibleEmployment.be  This test is no t yet approved or cleared by the [place] FDA and  has been authorized for detection and/or diagnosis of SARS-CoV-2 by FDA under an Emergency Use  Authorization (EUA). This EUA will remain  in effect (meaning this test can be used) for the duration of the COVID-19 declaration under Section 564(b)(1) of the Act, 21 U.S.C.section 360bbb-3(b)(1), unless the authorization is terminated  or revoked sooner.       Influenza A by PCR NEGATIVE NEGATIVE Final   Influenza B by PCR NEGATIVE NEGATIVE Final    Comment: (NOTE) The Xpert Xpress SARS-CoV-2/FLU/RSV plus assay is intended as an aid in the diagnosis of influenza from Nasopharyngeal swab specimens and should not be used as  a sole basis for treatment. Nasal washings and aspirates are unacceptable for Xpert Xpress SARS-CoV-2/FLU/RSV testing.  Fact Sheet for Patients: EntrepreneurPulse.com.au  Fact Sheet for Healthcare Providers: IncredibleEmployment.be  This test is not yet approved or cleared by the [place] FDA and has been authorized for detection and/or diagnosis of SARS-CoV-2 by FDA under an Emergency Use Authorization (EUA). This EUA will remain in effect (meaning this test can be used) for the duration of the COVID-19 declaration under Section 564(b)(1) of the Act, 21 U.S.C. section 360bbb-3(b)(1), unless the authorization is terminated or revoked.  Performed at [facility], [place] [address] [place], [place] [address]      Radiology Studies: No results found.  Scheduled Meds: . brimonidine  1 drop Right Eye BID  . DULoxetine  60 mg Oral Daily  . enoxaparin (LOVENOX) injection  40 mg Subcutaneous Q24H  . gabapentin  300 mg Oral QHS  . gabapentin  600 mg Oral TID WC  . insulin aspart  0-15 Units Subcutaneous TID WC  . insulin aspart  0-5 Units Subcutaneous QHS  . Netarsudil-Latanoprost  1 drop Right Eye QHS  . sodium chloride flush  3 mL Intravenous Q12H  . timolol  1 drop Right Eye BID   Continuous Infusions: . sodium chloride 75 mL/hr at 01/12/20 0344     LOS: 5 days   Marylu Lund, MD [facility] Pager On Amion  If 7PM-7AM, please contact night-coverage 01/12/2020, 3:39 PM

## 2020-01-12 NOTE — Progress Notes (Signed)
ANTICOAGULATION CONSULT NOTE - Initial Consult  Pharmacy Consult for Enoxaparin Indication: atrial fibrillation  Allergies  Allergen Reactions  . Ambien [Zolpidem Tartrate]     "lost his mind"  . Aspirin     Bleeding ulcer  . Dorzolamide Hcl-Timolol Mal     Other reaction(s): Other (See Comments), red and burning    Patient Measurements: Height: 5\' 9"  (175.3 cm) Weight: 108.8 kg (239 lb 13.8 oz) IBW/kg (Calculated) : 70.7  Vital Signs: Temp: 98.1 F (36.7 C) (01/03 1218) Temp Source: Oral (01/03 1218) BP: 147/71 (01/03 1218) Pulse Rate: 67 (01/03 1218)  Labs: Recent Labs    01/10/20 0355 01/11/20 0311 01/12/20 0132  CREATININE 1.26* 1.24 1.23    Estimated Creatinine Clearance: 58.2 mL/min (by C-G formula based on SCr of 1.23 mg/dL).   Medical History: Past Medical History:  Diagnosis Date  . Adenomatous colon polyp   . Allergic rhinitis   . BMI 40.0-44.9, adult (HCC)   . Chronic insomnia   . Chronic low back pain   . Constipation, unspecified   . Diabetes mellitus (HCC) 2015  . DJD (degenerative joint disease) of knee   . Hypercalcemia   . Hypertension   . Hypertriglyceridemia   . Junctional tachycardia (HCC)   . Lumbar facet arthropathy   . Morbid obesity (HCC)   . Neuropathy   . Nonallergic rhinitis   . OSA (obstructive sleep apnea)   . Other chronic pain   . Peripheral axonal neuropathy   . Upper GI bleed    due to aspirin  . Venous insufficiency   . Venous stasis dermatitis of both lower extremities     Medications:  Scheduled:  . brimonidine  1 drop Right Eye BID  . DULoxetine  60 mg Oral Daily  . gabapentin  300 mg Oral QHS  . gabapentin  600 mg Oral TID WC  . insulin aspart  0-15 Units Subcutaneous TID WC  . insulin aspart  0-5 Units Subcutaneous QHS  . Netarsudil-Latanoprost  1 drop Right Eye QHS  . sodium chloride flush  3 mL Intravenous Q12H  . timolol  1 drop Right Eye BID    Assessment: 80 yo M admitted with 3d history of  weakness and frequent falls, dehydrated with AKI, which is improving following IVFs.  Noted to has paroxysmal afib this admission.  Pharmacy has been asked to begin Enoxaparin.  Of note, pt received enoxaparin 40mg  SQ for VTE ppx at 1300 today.  Will reduce evening enoxaparin treatment dose and begin full dose in AM 1/4.  Goal of Therapy:  Anti-Xa level 0.6-1 units/ml 4hrs after LMWH dose given Monitor platelets by anticoagulation protocol: Yes   Plan:  Enoxaparin 80mg  SQ x 1 dose now. Start enoxaparin 110 mg SQ q12h 1/4 at 0600. Follow-up CBC and plans for oral anticoagulation.  96, Pharm.D., BCPS Clinical Pharmacist  **Pharmacist phone directory can be found on [email or web address] listed under [facility].  01/12/2020 3:55 PM

## 2020-01-12 NOTE — TOC Progression Note (Signed)
Transition of Care [facility]) - Progression Note    Patient Details  Name: [name] MRN: [id number] Date of Birth: 14-May-1940  Transition of Care [facility]) CM/SW Contact  Eduard Roux, [place] Phone Number: 01/12/2020, 2:14 PM  Clinical Narrative:     Insurance remains pending- reference # [id number]  Antony Blackbird, MSW, LCSW Clinical Social Worker   Expected Discharge Plan: Skilled Nursing Facility Barriers to Discharge: Insurance Authorization,SNF Pending bed offer  Expected Discharge Plan and Services Expected Discharge Plan: Skilled Nursing Facility In-house Referral: Clinical Social Work   Post Acute Care Choice: Skilled Nursing Facility Living arrangements for the past 2 months: Single Family Home                                       Social Determinants of Health (SDOH) Interventions    Readmission Risk Interventions No flowsheet data found.

## 2020-01-13 DIAGNOSIS — I951 Orthostatic hypotension: Secondary | ICD-10-CM | POA: Diagnosis not present

## 2020-01-13 DIAGNOSIS — R531 Weakness: Secondary | ICD-10-CM | POA: Diagnosis not present

## 2020-01-13 DIAGNOSIS — E860 Dehydration: Secondary | ICD-10-CM | POA: Diagnosis not present

## 2020-01-13 LAB — MAGNESIUM: Magnesium: 1.9 mg/dL (ref 1.7–2.4)

## 2020-01-13 LAB — COMPREHENSIVE METABOLIC PANEL
ALT: 19 U/L (ref 0–44)
AST: 24 U/L (ref 15–41)
Albumin: 2.6 g/dL — ABNORMAL LOW (ref 3.5–5.0)
Alkaline Phosphatase: 50 U/L (ref 38–126)
Anion gap: 8 (ref 5–15)
BUN: 6 mg/dL — ABNORMAL LOW (ref 8–23)
CO2: 20 mmol/L — ABNORMAL LOW (ref 22–32)
Calcium: 8.9 mg/dL (ref 8.9–10.3)
Chloride: 112 mmol/L — ABNORMAL HIGH (ref 98–111)
Creatinine, Ser: 1.13 mg/dL (ref 0.61–1.24)
GFR, Estimated: >60 mL/min (ref >60)
Glucose, Bld: 113 mg/dL — ABNORMAL HIGH (ref 70–99)
Potassium: 3.9 mmol/L (ref 3.5–5.1)
Sodium: 140 mmol/L (ref 135–145)
Total Bilirubin: 1.1 mg/dL (ref 0.3–1.2)
Total Protein: 5.8 g/dL — ABNORMAL LOW (ref 6.5–8.1)

## 2020-01-13 LAB — CBC
HCT: 32.9 % — ABNORMAL LOW (ref 39.0–52.0)
Hemoglobin: 11.4 g/dL — ABNORMAL LOW (ref 13.0–17.0)
MCH: 31.8 pg (ref 26.0–34.0)
MCHC: 34.7 g/dL (ref 30.0–36.0)
MCV: 91.6 fL (ref 80.0–100.0)
Platelets: 181 10*3/uL (ref 150–400)
RBC: 3.59 MIL/uL — ABNORMAL LOW (ref 4.22–5.81)
RDW: 14.6 % (ref 11.5–15.5)
WBC: 8.5 10*3/uL (ref 4.0–10.5)
nRBC: 0 % (ref 0.0–0.2)

## 2020-01-13 LAB — GLUCOSE, CAPILLARY
Glucose-Capillary: 94 mg/dL (ref 70–99)
Glucose-Capillary: 131 mg/dL — ABNORMAL HIGH (ref 70–99)
Glucose-Capillary: 100 mg/dL — ABNORMAL HIGH (ref 70–99)
Glucose-Capillary: 91 mg/dL (ref 70–99)

## 2020-01-13 MED ORDER — GABAPENTIN 800 MG PO TABS
400.0000 mg | ORAL_TABLET | Freq: Three times a day (TID) | ORAL | Status: DC
  Filled 2020-01-13: qty 0.5

## 2020-01-13 MED ORDER — LOPERAMIDE HCL 2 MG PO CAPS
2.0000 mg | ORAL_CAPSULE | ORAL | Status: DC | PRN
  Administered 2020-01-13: 2 mg via ORAL
  Filled 2020-01-13: qty 1

## 2020-01-13 MED ORDER — APIXABAN 5 MG PO TABS
5.0000 mg | ORAL_TABLET | Freq: Two times a day (BID) | ORAL | Status: DC
  Administered 2020-01-13 – 2020-01-14 (×2): 5 mg via ORAL
  Filled 2020-01-13 (×3): qty 1

## 2020-01-13 MED ORDER — GABAPENTIN 400 MG PO CAPS
400.0000 mg | ORAL_CAPSULE | Freq: Three times a day (TID) | ORAL | Status: DC
  Administered 2020-01-13 – 2020-01-14 (×5): 400 mg via ORAL
  Filled 2020-01-13 (×5): qty 1

## 2020-01-13 NOTE — Progress Notes (Signed)
PROGRESS NOTE    [name]  [id number] DOB: January 17, 1940 DOA: 01/05/2020 PCP: Lavone Orn, MD    Brief Narrative:  80 y.o. male with medical history significant of hypertension, dyslipidemia, diabetes mellitus type 2, chronic low back pain, glaucoma, and obesity presents with complaints of weakness over the last 3 days.  At baseline patient utilizes a walker to get around.  He normally backs his walker into his bedroom to sleep at night.  His wife reported that 4 days ago he seemed to be dragging his feet some.  Following day patient fell while in the bathroom.  His wife uses a cane and was unable to help him get up.  One of the patient's son who lives with them and the other who does not had to help get him up.  Patient reportedly fell again later on that day, but they were able to help him get back into bed.  Denied any loss of consciousness or trauma to his head during these falls.  The following day the patient just stayed in bed all day and slept.  His wife brought him his meals and things yesterday due to patient symptoms she had a televisit with his primary care provider who recommended patient come to the hospital.  He denies having any significant fever, chills, chest pain, nausea, vomiting, abdominal pain, or diarrhea.  He chronically has issues with back pain and neuropathy.  Last medication added recently had been acetazolamide for glaucoma.  ED Course: Upon admission to the emergency department patient was seen to be afebrile, respirations 12-23, and orthostatic vital signs positive.  CT scan of the head showed a suspicious age-indeterminate small vessel ischemia in the left pons.  Labs significant for CBC within normal limits, BUN 23, creatinine 1.81(baseline previously 1.1 in 2015),  glucose 191, CK 154.  Urinalysis showed no acute abnormalities.  Influenza and COVID-19 screening were negative  Assessment & Plan:   Active Problems:   AKI (acute kidney injury) ([place])    Weakness   Dehydration   Orthostatic hypotension   Diabetes mellitus type 2 in obese (HCC)   Glaucoma  Weakness with frequent falls: Acute.  Patient presents with complaints of generalized weakness over the last 3 days prior to visit.  Initial CT imaging noted age-indeterminate small vessel ischemia of the left pons. -MRI brain personally reviewed, neg for acute infarct -Staff reports pt is markedly weak, difficult to stand and buckling down to bed. Family reports this is an acute decompensation for pt over his baseline -PT/OT recs for SNF, TOC is following for placement  Acute kidney injury secondary to dehydration/overdiuresis:  -Patient baseline creatinine previously noted to be around 1.1, but presents with creatinine up to 1.81 with BUN 23.  Urinalysis did not show any acute abnormalities patient had been ordered initially a 500 mL bolus of IV fluids and then a second 500 mL bolus of IV fluids.   -Concerns for dehydration given recent addition of Diamox -Continue normal saline IV fluids at 75 mL/h -Hold nephrotoxic agents -Cr now much improved to 1.13  Orthostatic hypotension: Acute.   -On admission patient's blood pressure dropped from patient with history of hypertension on furosemide 20 mg daily, telmisartan 80 mg daily, amlodipine 5 mg daily, and acetazolamide 500 mg daily.  -Held home blood pressure medications at time of presentation -BP remains stable thus far  Diabetes mellitus type 2 with neuropathy: Home medications include Trulicity and Metformin 500 mg twice daily. -Hypoglycemic protocol -hgb a1c of 6.9 -  Continuing to hold Metformin and Trulicity while in hospital -Cont with SSI as pt needs  Glaucoma: Patient's wife notes that Diamox was added for patient's glaucoma -Continue home regimen of eyedrops  Toxic metabolic encephalopathy -Mentation seems appropriate this AM with pt conversing appropriately -Staff reports at times confusion towards dusk. Question  sun-downing.  -Wife has observed some issues with short term memory prior to admit and had originally planned to have pt tested for dementia by PCP -Agree with outpatient eval for possible undiagnosed underlying dementia -Currently conversing appropriately  Hypokalemia -Cont to replace as needed -Repeat lytes in AM  Paroxysmal Afib -New diagnosis this visit -Tele strip reviewed with runs of afib noted -Chart reviewed. Over the summer, pt was referred to Cardiology by PCP for "tachycardia" but was found not to be tachycardic in office -Suspect paroxysmal afib, at present rate controlled.  -CHADS-VASc of at least 6, thus would benefit from anticoagulation -tolerated lovenox. Will transition to eliquis -Recent 2d echo was performed in 7/21, reviewed. Normal LVEF -Recommend f/u with pt's Cardiologist on d/c, Dr. Antoine Poche  DVT prophylaxis: Lovenox subq -> Eliquis Code Status: Full Family Communication: Pt in room, Pt's wife over phone on  1/2  Status is: Inpatient  The patient will require care spanning > 2 midnights and should be moved to inpatient because: Unsafe d/c plan  Dispo: The patient is from: Home              Anticipated d/c is to: SNF              Anticipated d/c date is: 3 days              Patient currently is medically stable to d/c. Just pending bed availability   Consultants:     Procedures:     Antimicrobials: Anti-infectives (From admission, onward)   None      Subjective: Eager to go home soon  Objective: Vitals:   01/12/20 2341 01/13/20 0419 01/13/20 0745 01/13/20 1119  BP: 136/66 (!) 150/71 (!) 164/74 (!) 133/55  Pulse: 62 60 60 66  Resp: 20 18 19 20   Temp: 98.5 F (36.9 C) 98.4 F (36.9 C) 99.1 F (37.3 C) 97.9 F (36.6 C)  TempSrc: Oral Oral Oral Oral  SpO2: 100% 93% 96% 93%  Weight:  114.5 kg    Height:        Intake/Output Summary (Last 24 hours) at 01/13/2020 1457 Last data filed at 01/13/2020 03/12/2020 Gross per 24 hour  Intake  1691.88 ml  Output 400 ml  Net 1291.88 ml   Filed Weights   01/06/20 1851 01/13/20 0419  Weight: 108.8 kg 114.5 kg    Examination: General exam: Awake, laying in bed, in nad Respiratory system: Normal respiratory effort, no wheezing Cardiovascular system: regular rate, s1, s2 Gastrointestinal system: Soft, nondistended, positive BS Central nervous system: CN2-12 grossly intact, strength intact Extremities: Perfused, no clubbing Skin: Normal skin turgor, no notable skin lesions seen Psychiatry: Mood normal // no visual hallucinations   Data Reviewed: I have personally reviewed following labs and imaging studies  CBC: Recent Labs  Lab 01/07/20 0143 01/09/20 0033 01/13/20 0251  WBC 7.1 7.5 8.5  HGB 11.9* 11.3* 11.4*  HCT 34.3* 32.5* 32.9*  MCV 90.7 91.5 91.6  PLT 218 198 181   Basic Metabolic Panel: Recent Labs  Lab 01/08/20 0803 01/09/20 0033 01/10/20 0355 01/11/20 0311 01/12/20 0132 01/13/20 0251  NA  --  139 138 137 139 140  K  --  4.7 3.5 3.3* 3.8 3.9  CL  --  114* 113* 111 113* 112*  CO2  --  16* 17* 18* 18* 20*  GLUCOSE  --  119* 114* 138* 136* 113*  BUN  --  13 9 8 9  6*  CREATININE  --  1.42* 1.26* 1.24 1.23 1.13  CALCIUM  --  8.7* 8.7* 8.5* 8.6* 8.9  MG 1.9  --   --  1.9  --  1.9   GFR: Estimated Creatinine Clearance: 65 mL/min (by C-G formula based on SCr of 1.13 mg/dL). Liver Function Tests: Recent Labs  Lab 01/09/20 0033 01/10/20 0355 01/11/20 0311 01/12/20 0132 01/13/20 0251  AST 43* 19 20 23 24   ALT 14 15 18 20 19   ALKPHOS 50 51 50 52 50  BILITOT 1.7* 0.7 1.2 0.8 1.1  PROT 5.6* 5.6* 5.5* 5.6* 5.8*  ALBUMIN 2.8* 2.7* 2.6* 2.5* 2.6*   No results for input(s): LIPASE, AMYLASE in the last 168 hours. No results for input(s): AMMONIA in the last 168 hours. Coagulation Profile: No results for input(s): INR, PROTIME in the last 168 hours. Cardiac Enzymes: No results for input(s): CKTOTAL, CKMB, CKMBINDEX, TROPONINI in the last 168  hours. BNP (last 3 results) No results for input(s): PROBNP in the last 8760 hours. HbA1C: No results for input(s): HGBA1C in the last 72 hours. CBG: Recent Labs  Lab 01/12/20 1215 01/12/20 1639 01/12/20 2055 01/13/20 0603 01/13/20 1119  GLUCAP 109* 111* 137* 94 131*   Lipid Profile: No results for input(s): CHOL, HDL, LDLCALC, TRIG, CHOLHDL, LDLDIRECT in the last 72 hours. Thyroid Function Tests: No results for input(s): TSH, T4TOTAL, FREET4, T3FREE, THYROIDAB in the last 72 hours. Anemia Panel: No results for input(s): VITAMINB12, FOLATE, FERRITIN, TIBC, IRON, RETICCTPCT in the last 72 hours. Sepsis Labs: No results for input(s): PROCALCITON, LATICACIDVEN in the last 168 hours.  Recent Results (from the past 240 hour(s))  Resp Panel by RT-PCR (Flu A&B, Covid) Nasopharyngeal Swab     Status: None   Collection Time: 01/06/20  7:43 AM   Specimen: Nasopharyngeal Swab; Nasopharyngeal(NP) swabs in vial transport medium  Result Value Ref Range Status   SARS Coronavirus 2 by RT PCR NEGATIVE NEGATIVE Final    Comment: (NOTE) SARS-CoV-2 target nucleic acids are NOT DETECTED.  The SARS-CoV-2 RNA is generally detectable in upper respiratory specimens during the acute phase of infection. The lowest concentration of SARS-CoV-2 viral copies this assay can detect is 138 copies/mL. A negative result does not preclude SARS-Cov-2 infection and should not be used as the sole basis for treatment or other patient management decisions. A negative result may occur with  improper specimen collection/handling, submission of specimen other than nasopharyngeal swab, presence of viral mutation(s) within the areas targeted by this assay, and inadequate number of viral copies(<138 copies/mL). A negative result must be combined with clinical observations, patient history, and epidemiological information. The expected result is Negative.  Fact Sheet for Patients:   EntrepreneurPulse.com.au  Fact Sheet for Healthcare Providers:  IncredibleEmployment.be  This test is no t yet approved or cleared by the [place] FDA and  has been authorized for detection and/or diagnosis of SARS-CoV-2 by FDA under an Emergency Use Authorization (EUA). This EUA will remain  in effect (meaning this test can be used) for the duration of the COVID-19 declaration under Section 564(b)(1) of the Act, 21 U.S.C.section 360bbb-3(b)(1), unless the authorization is terminated  or revoked sooner.       Influenza A by PCR NEGATIVE  NEGATIVE Final   Influenza B by PCR NEGATIVE NEGATIVE Final    Comment: (NOTE) The Xpert Xpress SARS-CoV-2/FLU/RSV plus assay is intended as an aid in the diagnosis of influenza from Nasopharyngeal swab specimens and should not be used as a sole basis for treatment. Nasal washings and aspirates are unacceptable for Xpert Xpress SARS-CoV-2/FLU/RSV testing.  Fact Sheet for Patients: EntrepreneurPulse.com.au  Fact Sheet for Healthcare Providers: IncredibleEmployment.be  This test is not yet approved or cleared by the [place] FDA and has been authorized for detection and/or diagnosis of SARS-CoV-2 by FDA under an Emergency Use Authorization (EUA). This EUA will remain in effect (meaning this test can be used) for the duration of the COVID-19 declaration under Section 564(b)(1) of the Act, 21 U.S.C. section 360bbb-3(b)(1), unless the authorization is terminated or revoked.  Performed at [facility], [place] [address], [place], [place] [address]      Radiology Studies: No results found.  Scheduled Meds: . apixaban  5 mg Oral BID  . brimonidine  1 drop Right Eye BID  . DULoxetine  60 mg Oral Daily  . gabapentin  300 mg Oral QHS  . gabapentin  400 mg Oral TID WC  . insulin aspart  0-15 Units Subcutaneous TID WC  . insulin aspart  0-5 Units Subcutaneous  QHS  . Netarsudil-Latanoprost  1 drop Right Eye QHS  . sodium chloride flush  3 mL Intravenous Q12H  . timolol  1 drop Right Eye BID   Continuous Infusions: . sodium chloride 75 mL/hr at 01/12/20 2148     LOS: 6 days   Marylu Lund, MD [facility] Pager On Amion  If 7PM-7AM, please contact night-coverage 01/13/2020, 2:57 PM

## 2020-01-13 NOTE — Progress Notes (Signed)
ANTICOAGULATION CONSULT NOTE   Pharmacy Consult for Enoxaparin > Switch to Eliquis Indication: atrial fibrillation  Allergies  Allergen Reactions  . Ambien [Zolpidem Tartrate]     "lost his mind"  . Aspirin     Bleeding ulcer  . Dorzolamide Hcl-Timolol Mal     Other reaction(s): Other (See Comments), red and burning    Patient Measurements: Height: 5\' 9"  (175.3 cm) Weight: 114.5 kg (252 lb 6.8 oz) IBW/kg (Calculated) : 70.7  Vital Signs: Temp: 97.9 F (36.6 C) (01/04 1119) Temp Source: Oral (01/04 1119) BP: 133/55 (01/04 1119) Pulse Rate: 66 (01/04 1119)  Labs: Recent Labs    01/11/20 0311 01/12/20 0132 01/13/20 0251  HGB  --   --  11.4*  HCT  --   --  32.9*  PLT  --   --  181  CREATININE 1.24 1.23 1.13    Estimated Creatinine Clearance: 65 mL/min (by C-G formula based on SCr of 1.13 mg/dL).   Medical History: Past Medical History:  Diagnosis Date  . Adenomatous colon polyp   . Allergic rhinitis   . BMI 40.0-44.9, adult (HCC)   . Chronic insomnia   . Chronic low back pain   . Constipation, unspecified   . Diabetes mellitus (HCC) 2015  . DJD (degenerative joint disease) of knee   . Hypercalcemia   . Hypertension   . Hypertriglyceridemia   . Junctional tachycardia (HCC)   . Lumbar facet arthropathy   . Morbid obesity (HCC)   . Neuropathy   . Nonallergic rhinitis   . OSA (obstructive sleep apnea)   . Other chronic pain   . Peripheral axonal neuropathy   . Upper GI bleed    due to aspirin  . Venous insufficiency   . Venous stasis dermatitis of both lower extremities     Medications:  Scheduled:  . apixaban  5 mg Oral BID  . brimonidine  1 drop Right Eye BID  . DULoxetine  60 mg Oral Daily  . gabapentin  300 mg Oral QHS  . gabapentin  400 mg Oral TID WC  . insulin aspart  0-15 Units Subcutaneous TID WC  . insulin aspart  0-5 Units Subcutaneous QHS  . Netarsudil-Latanoprost  1 drop Right Eye QHS  . sodium chloride flush  3 mL Intravenous Q12H   . timolol  1 drop Right Eye BID    Assessment: 80 yo M admitted with 3d history of weakness and frequent falls, dehydrated with AKI, which is improving following IVFs.  Noted to has paroxysmal afib this admission.  Pharmacy has been asked to begin Enoxaparin.  Of note, pt received enoxaparin 40mg  SQ for VTE ppx at 1300 today.  Will reduce evening enoxaparin treatment dose and begin full dose in AM 1/4.  Pharmacy asked to switch Lovenox to Eliquis today.  He had lovenox dose ~ 630 AM.  No overt bleeding or complications noted.  CBC stable.  Goal of Therapy:  Monitor platelets by anticoagulation protocol: Yes   Plan:  Stop Lovenox First Eliquis dose tonight at 1830 PM - will give 5 mg BID. Will ask case management to investigate affordability for him. Educated patient and wife about Eliquis.  96, , BCPS, BCCP Clinical Pharmacist  01/13/2020 1:25 PM   [facility] pharmacy phone numbers are listed on [email or web address]

## 2020-01-13 NOTE — Progress Notes (Signed)
Plan of care reviewed. Pt's been progressing. Alert and oriented x 4. One assist to bedside commode with unsteady gait.  He's hemodynamically stable. NSR/SB on monitor, HR 58-65, BP stable 106/48 -150/ 71 mmHg, remained afebrile. No acute distress noted tonight. We will continue to monitor.  Filiberto Pinks, RN

## 2020-01-13 NOTE — Discharge Instructions (Signed)

## 2020-01-13 NOTE — Progress Notes (Signed)
Occupational Therapy Treatment Patient Details Name: [name] MRN: [id number] DOB: 1940/12/15 Today's Date: 01/13/2020    History of present illness 80 y.o. male with medical history significant of hypertension, dyslipidemia, diabetes mellitus type 2, chronic low back pain, glaucoma, and obesity presents with complaints of weakness over the last 3 days.CT scan of head showed age indeterminate small vessel ischemia of the L pons, MRI with similar results, but no acute infarct, AKI due to dehydration, orthostatic hypotension.   OT comments  Patient continues to make steady progress towards goals in skilled OT session. Patient's session encompassed functional mobility, ADLs, and toilet transfers. Pt continues to be minimally impulsive with regard to safety, requiring increased cues to transfer safety from into recliner, and off of toilet. Pt noted to have large loose stool bowel movement requiring increased assist for clean up as pt was unable to complete peri-care in sitting or standing. Pt also requiring external cues for pacing, as pt audibly having increased respirations after transferring off of toilet and back to recliner and requesting to walk further. Recommendation remains appropriate, therapy will continue to follow.     Follow Up Recommendations  SNF    Equipment Recommendations  3 in 1 bedside commode;Tub/shower seat    Recommendations for Other Services      Precautions / Restrictions Precautions Precautions: Fall Precaution Comments: chart reports h/o recent falls Restrictions Weight Bearing Restrictions: No       Mobility Bed Mobility               General bed mobility comments: OOB in recliner  Transfers Overall transfer level: Needs assistance Equipment used: Rolling walker (2 wheeled) Transfers: Sit to/from [employer] Sit to Stand: Min assist Stand pivot transfers: Min assist       General transfer comment: min A for power up and  steadying from recliner, maximal cuing for hand placement when transitioning off of toilet and back to recliner    Balance Overall balance assessment: Needs assistance Sitting-balance support: Feet supported Sitting balance-Leahy Scale: Fair     Standing balance support: Bilateral upper extremity supported Standing balance-Leahy Scale: Poor Standing balance comment: needs UE support                           ADL either performed or assessed with clinical judgement   ADL Overall ADL's : Needs assistance/impaired     Grooming: Wash/dry hands;Standing                   Toilet Transfer: Cueing for sequencing;Cueing for safety;Minimal Holiday representative;Ambulation;Grab bars;RW Statistician Details (indicate cue type and reason): rushed due to loose BM Toileting- Clothing Manipulation and Hygiene: Maximal assistance Toileting - Clothing Manipulation Details (indicate cue type and reason): unable to complete in sitting, utilized grab bars to stand, but unable to wipe self requiring assist     Functional mobility during ADLs: Rolling walker;Moderate assistance General ADL Comments: Mod A due to loose BM causing increased time and assist for peri-care due to poor activity tolerance     Vision       Perception     Praxis      Cognition Arousal/Alertness: Awake/alert Behavior During Therapy: WFL for tasks assessed/performed Overall Cognitive Status: No family/caregiver present to determine baseline cognitive functioning Area of Impairment: Attention;Following commands;Safety/judgement;Problem solving                   Current Attention Level: Sustained;Alternating  Following Commands: Follows multi-step commands with increased time;Follows one step commands with increased time Safety/Judgement: Decreased awareness of safety;Decreased awareness of deficits   Problem Solving: Slow processing;Decreased initiation;Requires verbal cues;Requires  tactile cues General Comments: continues to have slow processing, and requires increased cuing for safety        Exercises     Shoulder Instructions       General Comments      Pertinent Vitals/ Pain       Pain Assessment: Faces Faces Pain Scale: Hurts little more Pain Location: generalized Pain Descriptors / Indicators: Grimacing;Guarding Pain Intervention(s): Monitored during session;Limited activity within patient's tolerance;Repositioned  Home Living                                          Prior Functioning/Environment              Frequency  Min 2X/week        Progress Toward Goals  OT Goals(current goals can now be found in the care plan section)  Progress towards OT goals: Progressing toward goals  Acute Rehab OT Goals Patient Stated Goal: pt wants to go home OT Goal Formulation: With patient Time For Goal Achievement: 01/21/20 Potential to Achieve Goals: Fair  Plan Discharge plan remains appropriate    Co-evaluation                 AM-PAC OT "6 Clicks" Daily Activity     Outcome Measure   Help from another person eating meals?: None Help from another person taking care of personal grooming?: A Little Help from another person toileting, which includes using toliet, bedpan, or urinal?: A Lot Help from another person bathing (including washing, rinsing, drying)?: A Lot Help from another person to put on and taking off regular upper body clothing?: A Little Help from another person to put on and taking off regular lower body clothing?: A Lot 6 Click Score: 16    End of Session Equipment Utilized During Treatment: Rolling walker  OT Visit Diagnosis: Unsteadiness on feet (R26.81);Muscle weakness (generalized) (M62.81);History of falling (Z91.81);Pain Pain - part of body: Leg   Activity Tolerance Patient tolerated treatment well   Patient Left in chair;with call bell/phone within reach;with chair alarm set   Nurse  Communication Mobility status;Other (comment) (Pt requesting immodium)        Time: 7494-4967 OT Time Calculation (min): 31 min  Charges: OT General Charges $OT Visit: 1 Visit OT Treatments $Self Care/Home Management : 23-37 mins  Pollyann Glen E. Tarron Krolak, COTA/L Acute Rehabilitation Services [phone]   Cherlyn Cushing 01/13/2020, 11:50 AM

## 2020-01-14 DIAGNOSIS — I872 Venous insufficiency (chronic) (peripheral): Secondary | ICD-10-CM | POA: Diagnosis not present

## 2020-01-14 DIAGNOSIS — G4733 Obstructive sleep apnea (adult) (pediatric): Secondary | ICD-10-CM | POA: Diagnosis not present

## 2020-01-14 DIAGNOSIS — Z599 Problem related to housing and economic circumstances, unspecified: Secondary | ICD-10-CM | POA: Diagnosis not present

## 2020-01-14 DIAGNOSIS — R5381 Other malaise: Secondary | ICD-10-CM | POA: Diagnosis not present

## 2020-01-14 DIAGNOSIS — E860 Dehydration: Secondary | ICD-10-CM | POA: Diagnosis not present

## 2020-01-14 DIAGNOSIS — G4709 Other insomnia: Secondary | ICD-10-CM | POA: Diagnosis not present

## 2020-01-14 DIAGNOSIS — R600 Localized edema: Secondary | ICD-10-CM | POA: Diagnosis not present

## 2020-01-14 DIAGNOSIS — I6789 Other cerebrovascular disease: Secondary | ICD-10-CM | POA: Diagnosis not present

## 2020-01-14 DIAGNOSIS — I480 Paroxysmal atrial fibrillation: Secondary | ICD-10-CM | POA: Diagnosis not present

## 2020-01-14 DIAGNOSIS — E1169 Type 2 diabetes mellitus with other specified complication: Secondary | ICD-10-CM | POA: Diagnosis not present

## 2020-01-14 DIAGNOSIS — N179 Acute kidney failure, unspecified: Secondary | ICD-10-CM | POA: Diagnosis not present

## 2020-01-14 DIAGNOSIS — Z743 Need for continuous supervision: Secondary | ICD-10-CM | POA: Diagnosis not present

## 2020-01-14 DIAGNOSIS — I10 Essential (primary) hypertension: Secondary | ICD-10-CM | POA: Diagnosis not present

## 2020-01-14 DIAGNOSIS — H409 Unspecified glaucoma: Secondary | ICD-10-CM | POA: Diagnosis not present

## 2020-01-14 DIAGNOSIS — G9341 Metabolic encephalopathy: Secondary | ICD-10-CM | POA: Diagnosis not present

## 2020-01-14 DIAGNOSIS — E0841 Diabetes mellitus due to underlying condition with diabetic mononeuropathy: Secondary | ICD-10-CM | POA: Diagnosis not present

## 2020-01-14 DIAGNOSIS — R279 Unspecified lack of coordination: Secondary | ICD-10-CM | POA: Diagnosis not present

## 2020-01-14 DIAGNOSIS — R799 Abnormal finding of blood chemistry, unspecified: Secondary | ICD-10-CM | POA: Diagnosis not present

## 2020-01-14 DIAGNOSIS — R296 Repeated falls: Secondary | ICD-10-CM | POA: Diagnosis not present

## 2020-01-14 DIAGNOSIS — R6889 Other general symptoms and signs: Secondary | ICD-10-CM | POA: Diagnosis not present

## 2020-01-14 DIAGNOSIS — N178 Other acute kidney failure: Secondary | ICD-10-CM | POA: Diagnosis not present

## 2020-01-14 DIAGNOSIS — M6281 Muscle weakness (generalized): Secondary | ICD-10-CM | POA: Diagnosis not present

## 2020-01-14 DIAGNOSIS — Z7189 Other specified counseling: Secondary | ICD-10-CM | POA: Diagnosis not present

## 2020-01-14 DIAGNOSIS — N289 Disorder of kidney and ureter, unspecified: Secondary | ICD-10-CM | POA: Diagnosis not present

## 2020-01-14 DIAGNOSIS — M1990 Unspecified osteoarthritis, unspecified site: Secondary | ICD-10-CM | POA: Diagnosis not present

## 2020-01-14 DIAGNOSIS — R531 Weakness: Secondary | ICD-10-CM | POA: Diagnosis not present

## 2020-01-14 DIAGNOSIS — R1311 Dysphagia, oral phase: Secondary | ICD-10-CM | POA: Diagnosis not present

## 2020-01-14 DIAGNOSIS — R2681 Unsteadiness on feet: Secondary | ICD-10-CM | POA: Diagnosis not present

## 2020-01-14 DIAGNOSIS — G8929 Other chronic pain: Secondary | ICD-10-CM | POA: Diagnosis not present

## 2020-01-14 DIAGNOSIS — R059 Cough, unspecified: Secondary | ICD-10-CM | POA: Diagnosis not present

## 2020-01-14 DIAGNOSIS — M1388 Other specified arthritis, other site: Secondary | ICD-10-CM | POA: Diagnosis not present

## 2020-01-14 DIAGNOSIS — Z13228 Encounter for screening for other metabolic disorders: Secondary | ICD-10-CM | POA: Diagnosis not present

## 2020-01-14 DIAGNOSIS — I4891 Unspecified atrial fibrillation: Secondary | ICD-10-CM | POA: Diagnosis not present

## 2020-01-14 LAB — GLUCOSE, CAPILLARY
Glucose-Capillary: 112 mg/dL — ABNORMAL HIGH (ref 70–99)
Glucose-Capillary: 116 mg/dL — ABNORMAL HIGH (ref 70–99)
Glucose-Capillary: 98 mg/dL (ref 70–99)

## 2020-01-14 LAB — COMPREHENSIVE METABOLIC PANEL
ALT: 15 U/L (ref 0–44)
AST: 17 U/L (ref 15–41)
Albumin: 2.6 g/dL — ABNORMAL LOW (ref 3.5–5.0)
Alkaline Phosphatase: 56 U/L (ref 38–126)
Anion gap: 7 (ref 5–15)
BUN: 5 mg/dL — ABNORMAL LOW (ref 8–23)
CO2: 21 mmol/L — ABNORMAL LOW (ref 22–32)
Calcium: 8.6 mg/dL — ABNORMAL LOW (ref 8.9–10.3)
Chloride: 113 mmol/L — ABNORMAL HIGH (ref 98–111)
Creatinine, Ser: 1.21 mg/dL (ref 0.61–1.24)
GFR, Estimated: >60 mL/min (ref >60)
Glucose, Bld: 104 mg/dL — ABNORMAL HIGH (ref 70–99)
Potassium: 3.6 mmol/L (ref 3.5–5.1)
Sodium: 141 mmol/L (ref 135–145)
Total Bilirubin: 1.3 mg/dL — ABNORMAL HIGH (ref 0.3–1.2)
Total Protein: 5.7 g/dL — ABNORMAL LOW (ref 6.5–8.1)

## 2020-01-14 LAB — CBC
HCT: 33.6 % — ABNORMAL LOW (ref 39.0–52.0)
Hemoglobin: 11 g/dL — ABNORMAL LOW (ref 13.0–17.0)
MCH: 30.4 pg (ref 26.0–34.0)
MCHC: 32.7 g/dL (ref 30.0–36.0)
MCV: 92.8 fL (ref 80.0–100.0)
Platelets: 185 10*3/uL (ref 150–400)
RBC: 3.62 MIL/uL — ABNORMAL LOW (ref 4.22–5.81)
RDW: 14.6 % (ref 11.5–15.5)
WBC: 8.3 10*3/uL (ref 4.0–10.5)
nRBC: 0 % (ref 0.0–0.2)

## 2020-01-14 LAB — SARS CORONAVIRUS 2 (TAT 6-24 HRS): SARS Coronavirus 2: NEGATIVE

## 2020-01-14 MED ORDER — GABAPENTIN 400 MG PO CAPS: 400.0000 mg | ORAL_CAPSULE | Freq: Three times a day (TID) | ORAL | 0 refills | Status: DC

## 2020-01-14 MED ORDER — AMLODIPINE BESYLATE 5 MG PO TABS
5.0000 mg | ORAL_TABLET | Freq: Every day | ORAL | Status: DC
  Administered 2020-01-14: 5 mg via ORAL
  Filled 2020-01-14: qty 1

## 2020-01-14 MED ORDER — LIDOCAINE HCL 1 % IJ SOLN
INTRAMUSCULAR | Status: AC
  Filled 2020-01-14: qty 60

## 2020-01-14 MED ORDER — APIXABAN 5 MG PO TABS: 5.0000 mg | ORAL_TABLET | Freq: Two times a day (BID) | ORAL | 0 refills | Status: DC

## 2020-01-14 NOTE — Progress Notes (Signed)
Physical Therapy Treatment Patient Details Name: [name] MRN: [id number] DOB: 03/27/1940 Today's Date: 01/14/2020    History of Present Illness 80 y.o. male with medical history significant of hypertension, dyslipidemia, diabetes mellitus type 2, chronic low back pain, glaucoma, and obesity presents with complaints of weakness over the last 3 days.CT scan of head showed age indeterminate small vessel ischemia of the L pons, MRI with similar results, but no acute infarct, AKI due to dehydration, orthostatic hypotension.    PT Comments    Pt seated in recliner sleeuping soundly.  Upon waking reports chair is soiled.  PTA assisted with pericare.  Pt is impulsive during transitions and presents with safety concerns when using RW.  Pt has a history of falls and while improving he continues to require min assistance.  Continue to recommend SNF placement at this time to improve strength, balance and function before returning home.     Follow Up Recommendations  SNF     Equipment Recommendations  Wheelchair (measurements PT);Wheelchair cushion (measurements PT);3in1 (PT)    Recommendations for Other Services       Precautions / Restrictions Precautions Precautions: Fall Precaution Comments: chart reports h/o recent falls Restrictions Weight Bearing Restrictions: No    Mobility  Bed Mobility               General bed mobility comments: OOB in recliner  Transfers   Equipment used: Rolling walker (2 wheeled) Transfers: Sit to/from [employer] Sit to Stand: Min guard Stand pivot transfers: Min assist       General transfer comment: Cues for hand placement to and from seated surface.  Poor safety awareness during turns from bed to recliner.  Ambulation/Gait Ambulation/Gait assistance: Min assist Gait Distance (Feet): 120 Feet Assistive device: Rolling walker (2 wheeled) Gait Pattern/deviations: Step-through pattern;Shuffle;Antalgic;Trunk flexed      General Gait Details: Continued cues for RW safety to step closer to device for improved support.  Pt advanced gt distance well and continues to fatigue with activity. SPO2 96% post gt training.  Mild instability with min assistance to correct RW position.   Stairs             Wheelchair Mobility    Modified Rankin (Stroke Patients Only)       Balance Overall balance assessment: Needs assistance Sitting-balance support: Feet supported Sitting balance-Leahy Scale: Fair       Standing balance-Leahy Scale: Poor                              Cognition Arousal/Alertness: Awake/alert Behavior During Therapy: WFL for tasks assessed/performed Overall Cognitive Status: Impaired/Different from baseline Area of Impairment: Following commands;Safety/judgement;Problem solving                       Following Commands: Follows one step commands with increased time;Follows one step commands inconsistently Safety/Judgement: Decreased awareness of safety;Decreased awareness of deficits   Problem Solving: Slow processing;Decreased initiation;Requires verbal cues;Requires tactile cues General Comments: continues to have slow processing, and requires increased cuing for safety      Exercises      General Comments        Pertinent Vitals/Pain Pain Assessment: No/denies pain    Home Living                      Prior Function            PT  Goals (current goals can now be found in the care plan section) Acute Rehab PT Goals Patient Stated Goal: pt wants to go home Potential to Achieve Goals: Good Progress towards PT goals: Progressing toward goals    Frequency    Min 2X/week      PT Plan Current plan remains appropriate    Co-evaluation              AM-PAC PT "6 Clicks" Mobility   Outcome Measure  Help needed turning from your back to your side while in a flat bed without using bedrails?: A Little Help needed moving from  lying on your back to sitting on the side of a flat bed without using bedrails?: A Little Help needed moving to and from a bed to a chair (including a wheelchair)?: A Little Help needed standing up from a chair using your arms (e.g., wheelchair or bedside chair)?: A Little Help needed to walk in hospital room?: A Little Help needed climbing 3-5 steps with a railing? : A Little 6 Click Score: 18    End of Session Equipment Utilized During Treatment: Gait belt Activity Tolerance: Patient limited by pain;Patient limited by fatigue Patient left: in chair;with call bell/phone within reach;with chair alarm set Nurse Communication: Mobility status PT Visit Diagnosis: Muscle weakness (generalized) (M62.81);Difficulty in walking, not elsewhere classified (R26.2);Pain Pain - Right/Left: Right Pain - part of body: Leg     Time: [id number] PT Time Calculation (min) (ACUTE ONLY): 23 min  Charges:  $Gait Training: 8-22 mins $Therapeutic Activity: 8-22 mins                     Erasmo Leventhal , PTA Acute Rehabilitation Services Pager [phone] Office [phone]     Bailynn Dyk Eli Hose 01/14/2020, 11:39 AM

## 2020-01-14 NOTE — Progress Notes (Signed)
Patient had several episodes of oxygen desat in mid to upper 80's.  Each time when RN checked on patient, patient was taking tele monitor leads, gown and trying to get sit up in the bed stating that he can not breath, patient appeared to be anxious, RN was able to reassure & calm patient down.  During the episodes nasal cannula was applied to help patient calm down, once calm, patient placed back on room air.  RN will continue to monitor patient

## 2020-01-14 NOTE — Discharge Summary (Addendum)
Discharge Summary  [name] [id number] DOB: Aug 07, 1940  PCP: Lavone Orn, MD  Admit date: 01/05/2020 Discharge date: 01/14/2020  Time spent: 35 minutes  Recommendations for Outpatient Follow-up:  1. Follow-up with your PCP in 1 to 2 weeks 2. Follow-up with your cardiologist in 1 to 2 weeks. 3. Take your medications as prescribed 4. Continue PT OT with assistance and fall precautions 5. Obtain CMP on Monday, 01/19/2020 at your PCPs office.  Discharge Diagnoses:  Active Hospital Problems   Diagnosis Date Noted  . AKI (acute kidney injury) ([place]) 01/06/2020  . Weakness 01/06/2020  . Dehydration 01/06/2020  . Orthostatic hypotension 01/06/2020  . Diabetes mellitus type 2 in obese ([place]) 01/06/2020  . Glaucoma 01/06/2020    Resolved Hospital Problems  No resolved problems to display.    Discharge Condition: Stable  Diet recommendation: Resume previous diet. Vitals:   01/14/20 0720 01/14/20 1026  BP: (!) 180/81 (!) 158/77  Pulse: 64 60  Resp: 20 20  Temp: 98.6 F (37 C) 97.8 F (36.6 C)  SpO2: 93% 96%    History of present illness:  80 y.o.malewith medical history significant ofhypertension, dyslipidemia, diabetes mellitus type 2, chronic low back pain,glaucoma, andobesitypresents with complaints ofweakness of 3 days duration.  Associated with recurrent falls.  Denies loss of consciousness.  MRI brain done on 01/06/2020 - for acute CVA, atrophy and chronic ischemic changes in the white matter and pons.  Small chronic infarct left frontal lobe.  Negative for hemorrhage or mass.  ED Course:Upon admission to the emergency department patient was seen to be afebrile, respirations 12-23, and orthostatic vital signs positive. CT scan of the head showed a suspicious age-indeterminate small vessel ischemia in the left pons. Labs significant for CBC within normal limits, BUN 23, creatinine 1.81(baseline previously 1.1 in 2015), glucose 191, CK 154. Urinalysis  showed no acute abnormalities. Influenza and COVID-19 screening were negative.  01/14/20: Seen and examined at his bedside.  No acute events overnight.  He has no new complaints.  Hospital Course:  Active Problems:   AKI (acute kidney injury) ([place])   Weakness   Dehydration   Orthostatic hypotension   Diabetes mellitus type 2 in obese ([facility])   Glaucoma  Weakness with frequent falls: Acute.  Presented with generalized weakness over the last 3 days prior to visit. Initial CT imaging noted age-indeterminate small vessel ischemia of the left pons. -MRI brain personally reviewed, neg for acute infarct -PT/OT recs for SNF, TOC assisted with SNF placement. Continue fall precautions  Acute kidney injury secondary todehydration/overdiuresis:  -Patient baseline creatinine previously noted to be around 1.1, but presents with creatinine up to 1.81with BUN 23. Received gentle IV fluid hydration with improvement of renal function.  Avoid nephrotoxins and dehydration.  Follow-up with PCP in 1 to 2 weeks.  Isolated elevated T bilirubin, unclear etiology T bilirubin 1.3 Alkaline phosphatase, AST and ALT normal Repeat CMP at your PCPs office on Monday, 01/19/2020.  Essential hypertension Home telmisartan on hold due to recent AKI Resume home Norvasc 5 mg daily Follow-up with PCP in 1 to 2 weeks  Diabetes mellitus type 2with neuropathy:  Home medications include Trulicity and Metformin 500 mg twice daily. -Hypoglycemic protocol -hgb a1c of 6.9 -Resume Trulicity and follow-up with your PCP.  Glaucoma: -Continue home regimen of eyedrops Follow-up with ophthalmologist  Toxic metabolic encephalopathy Alert and pleasant His mentation seems appropriate this morning -Wife has observed some issues with short term memory prior to admit and had originally planned to  have pt tested for dementia by PCP -Agree with outpatient eval for possible undiagnosed underlying dementia  Resolved post  repletion: Hypokalemia Serum potassium 3.6 on 01/14/2020.  Newly diagnosed paroxysmal Afib -New diagnosis this visit -Suspect paroxysmal afib, at present rate controlled.  -CHADS-VASc of at least 6, thus would benefit from anticoagulation Started on Eliquis, continue -Recent 2d echo was performed in 7/21, reviewed. Normal LVEF -Recommend f/u with pt's Cardiologist on d/c, Dr. Percival Spanish   Code Status: Full   Discharge Exam: BP (!) 158/77 (BP Location: Right Arm)   Pulse 60   Temp 97.8 F (36.6 C) (Oral)   Resp 20   Ht 5\' 9"  (1.753 m)   Wt 114.5 kg   SpO2 96%   BMI 37.28 kg/m  . General: 80 y.o. year-old male well developed well nourished in no acute distress.  Alert and pleasant. . Cardiovascular: Regular rate and rhythm with no rubs or gallops.  No thyromegaly or JVD noted.   [name] Kitchen Respiratory: Clear to auscultation with no wheezes or rales. Good inspiratory effort. . Abdomen: Soft nontender nondistended with normal bowel sounds x4 quadrants. . Musculoskeletal: No lower extremity edema bilaterally.   [name] Kitchen Psychiatry: Mood is appropriate for condition and setting  Discharge Instructions You were cared for by a hospitalist during your hospital stay. If you have any questions about your discharge medications or the care you received while you were in the hospital after you are discharged, you can call the unit and asked to speak with the hospitalist on call if the hospitalist that took care of you is not available. Once you are discharged, your primary care physician will handle any further medical issues. Please note that NO REFILLS for any discharge medications will be authorized once you are discharged, as it is imperative that you return to your primary care physician (or establish a relationship with a primary care physician if you do not have one) for your aftercare needs so that they can reassess your need for medications and monitor your lab values.   Allergies as of 01/14/2020       Reactions   Ambien [zolpidem Tartrate]    "lost his mind"   Aspirin    Bleeding ulcer   Dorzolamide Hcl-timolol Mal    Other reaction(s): Other (See Comments), red and burning      Medication List    STOP taking these medications   acetaZOLAMIDE 500 MG capsule Commonly known as: DIAMOX   azelastine 0.1 % nasal spray Commonly known as: ASTELIN   furosemide 20 MG tablet Commonly known as: LASIX   HYDROcodone-acetaminophen 5-325 MG tablet Commonly known as: NORCO/VICODIN   lidocaine 5 % ointment Commonly known as: XYLOCAINE   metFORMIN 500 MG tablet Commonly known as: GLUCOPHAGE   potassium chloride 10 MEQ CR capsule Commonly known as: MICRO-K   telmisartan 80 MG tablet Commonly known as: MICARDIS   traZODone 100 MG tablet Commonly known as: DESYREL   traZODone 50 MG tablet Commonly known as: DESYREL     TAKE these medications   acetaminophen 500 MG tablet Commonly known as: TYLENOL Take 1,000 mg by mouth every 6 (six) hours as needed for moderate pain.   amLODipine 5 MG tablet Commonly known as: NORVASC Take 5 mg by mouth daily.   apixaban 5 MG Tabs tablet Commonly known as: ELIQUIS Take 1 tablet (5 mg total) by mouth 2 (two) times daily.   Combigan 0.2-0.5 % ophthalmic solution Generic drug: brimonidine-timolol Place 1 drop into the right eye  2 (two) times daily.   DULoxetine 60 MG capsule Commonly known as: CYMBALTA Take 60 mg by mouth daily.   gabapentin 400 MG capsule Commonly known as: NEURONTIN Take 1 capsule (400 mg total) by mouth 3 (three) times daily with meals. What changed:   medication strength  how much to take  when to take this  Another medication with the same name was removed. Continue taking this medication, and follow the directions you see here.   ipratropium 0.03 % nasal spray Commonly known as: ATROVENT Place 2 sprays into both nostrils 2 (two) times daily as needed.   multivitamin tablet Take 1 tablet by mouth  daily.   Rocklatan 0.02-0.005 % Soln Generic drug: Netarsudil-Latanoprost Place 1 drop into the right eye at bedtime.   Trulicity [id number] Sopn Generic drug: Dulaglutide Inject into the skin.      Allergies  Allergen Reactions  . Ambien [Zolpidem Tartrate]     "lost his mind"  . Aspirin     Bleeding ulcer  . Dorzolamide Hcl-Timolol Mal     Other reaction(s): Other (See Comments), red and burning    Follow-up Information    Lavone Orn, MD. Call in 1 day(s).   Specialty: Internal Medicine Why: Please call for a post hospital follow-up appointment. Contact information: [address] [place] [address] [phone]        Minus Breeding, MD. Call in 1 day(s).   Specialty: Cardiology Why: Please call for a post hospital follow-up appointment. Contact information: [address] [place] [address] [phone]                The results of significant diagnostics from this hospitalization (including imaging, microbiology, ancillary and laboratory) are listed below for reference.    Significant Diagnostic Studies: DG Chest 2 View  Result Date: 01/06/2020 CLINICAL DATA:  80 year old male with weakness for 3 days. EXAM: CHEST - 2 VIEW COMPARISON:  Chest radiograph 06/12/2019 and earlier. FINDINGS: Lower lung volumes today. Mild respiratory motion on the lateral today. Mediastinal contours remain within normal limits. No pneumothorax, pulmonary edema, pleural effusion or confluent pulmonary opacity. Visualized tracheal air column is within normal limits. No acute osseous abnormality identified. Negative visible bowel gas pattern. Stable probable vascular calcifications in the upper abdomen. IMPRESSION: Low lung volumes.  No acute cardiopulmonary abnormality. Electronically Signed   By: Genevie Ann M.D.   On: 01/06/2020 07:37   CT Head Wo Contrast  Result Date: 01/06/2020 CLINICAL DATA:  80 year old male with weakness x3 days.  EXAM: CT HEAD WITHOUT CONTRAST TECHNIQUE: Contiguous axial images were obtained from the base of the skull through the vertex without intravenous contrast. COMPARISON:  None. FINDINGS: Brain: Cerebral volume is within normal limits for age. No midline shift, ventriculomegaly, mass effect, evidence of mass lesion, intracranial hemorrhage or evidence of cortically based acute infarction. Subtle hypodensity in the left pons (series 3, image 11), might be artifact. Elsewhere deep gray nuclei, brainstem and cerebellum are within normal limits. Mild for age patchy bilateral white matter hypodensity. Chronic appearing left parietal lobe cortical encephalomalacia on series 3, image 24. Vascular: Calcified atherosclerosis at the skull base. No suspicious intracranial vascular hyperdensity. Skull: No acute osseous abnormality identified. Sinuses/Orbits: Visualized paranasal sinuses and mastoids are clear. Other: No acute orbit or scalp soft tissue finding. Postoperative changes to both globes. IMPRESSION: 1. Suspicion of age indeterminate small vessel ischemia of the left Pons. Query right side weakness. 2. Otherwise there is a small  chronic infarct in the left parietal lobe, mild for age bilateral white matter changes. 3. No acute intracranial hemorrhage or acute cortically based infarct identified. Electronically Signed   By: Odessa Fleming M.D.   On: 01/06/2020 07:16   MR BRAIN WO CONTRAST  Result Date: 01/06/2020 CLINICAL DATA:  Stroke.  Weakness. EXAM: MRI HEAD WITHOUT CONTRAST TECHNIQUE: Multiplanar, multiecho pulse sequences of the brain and surrounding structures were obtained without intravenous contrast. COMPARISON:  CT head 01/06/2020 FINDINGS: Brain: Negative for acute infarct. Mild atrophy without hydrocephalus. Patchy hyperintensity in the pons bilaterally right greater than left consistent with chronic ischemia. Mild chronic white matter changes. Small chronic infarct left frontal lobe. Negative for hemorrhage or  mass. Vascular: Normal arterial flow voids. Skull and upper cervical spine: Negative Sinuses/Orbits: Paranasal sinuses clear. Small right mastoid effusion. Bilateral cataract extraction Other: None IMPRESSION: Negative for acute infarct. Atrophy and chronic ischemic change in the white matter and pons. Electronically Signed   By: Marlan Palau M.D.   On: 01/06/2020 11:42    Microbiology: Recent Results (from the past 240 hour(s))  Resp Panel by RT-PCR (Flu A&B, Covid) Nasopharyngeal Swab     Status: None   Collection Time: 01/06/20  7:43 AM   Specimen: Nasopharyngeal Swab; Nasopharyngeal(NP) swabs in vial transport medium  Result Value Ref Range Status   SARS Coronavirus 2 by RT PCR NEGATIVE NEGATIVE Final    Comment: (NOTE) SARS-CoV-2 target nucleic acids are NOT DETECTED.  The SARS-CoV-2 RNA is generally detectable in upper respiratory specimens during the acute phase of infection. The lowest concentration of SARS-CoV-2 viral copies this assay can detect is 138 copies/mL. A negative result does not preclude SARS-Cov-2 infection and should not be used as the sole basis for treatment or other patient management decisions. A negative result may occur with  improper specimen collection/handling, submission of specimen other than nasopharyngeal swab, presence of viral mutation(s) within the areas targeted by this assay, and inadequate number of viral copies(<138 copies/mL). A negative result must be combined with clinical observations, patient history, and epidemiological information. The expected result is Negative.  Fact Sheet for Patients:  BloggerCourse.com  Fact Sheet for Healthcare Providers:  SeriousBroker.it  This test is no t yet approved or cleared by the [place] FDA and  has been authorized for detection and/or diagnosis of SARS-CoV-2 by FDA under an Emergency Use Authorization (EUA). This EUA will remain  in effect  (meaning this test can be used) for the duration of the COVID-19 declaration under Section 564(b)(1) of the Act, 21 U.S.C.section 360bbb-3(b)(1), unless the authorization is terminated  or revoked sooner.       Influenza A by PCR NEGATIVE NEGATIVE Final   Influenza B by PCR NEGATIVE NEGATIVE Final    Comment: (NOTE) The Xpert Xpress SARS-CoV-2/FLU/RSV plus assay is intended as an aid in the diagnosis of influenza from Nasopharyngeal swab specimens and should not be used as a sole basis for treatment. Nasal washings and aspirates are unacceptable for Xpert Xpress SARS-CoV-2/FLU/RSV testing.  Fact Sheet for Patients: BloggerCourse.com  Fact Sheet for Healthcare Providers: SeriousBroker.it  This test is not yet approved or cleared by the [place] FDA and has been authorized for detection and/or diagnosis of SARS-CoV-2 by FDA under an Emergency Use Authorization (EUA). This EUA will remain in effect (meaning this test can be used) for the duration of the COVID-19 declaration under Section 564(b)(1) of the Act, 21 U.S.C. section 360bbb-3(b)(1), unless the authorization is terminated or revoked.  Performed  at [facility], [place] [address] [place], [place] [address]   SARS CORONAVIRUS 2 (TAT 6-24 HRS) Nasopharyngeal Nasopharyngeal Swab     Status: None   Collection Time: 01/13/20  4:11 PM   Specimen: Nasopharyngeal Swab  Result Value Ref Range Status   SARS Coronavirus 2 NEGATIVE NEGATIVE Final    Comment: (NOTE) SARS-CoV-2 target nucleic acids are NOT DETECTED.  The SARS-CoV-2 RNA is generally detectable in upper and lower respiratory specimens during the acute phase of infection. Negative results do not preclude SARS-CoV-2 infection, do not rule out co-infections with other pathogens, and should not be used as the sole basis for treatment or other patient management decisions. Negative results must be combined with  clinical observations, patient history, and epidemiological information. The expected result is Negative.  Fact Sheet for Patients: SugarRoll.be  Fact Sheet for Healthcare Providers: https://www.woods-mathews.com/  This test is not yet approved or cleared by the [place] FDA and  has been authorized for detection and/or diagnosis of SARS-CoV-2 by FDA under an Emergency Use Authorization (EUA). This EUA will remain  in effect (meaning this test can be used) for the duration of the COVID-19 declaration under Se ction 564(b)(1) of the Act, 21 U.S.C. section 360bbb-3(b)(1), unless the authorization is terminated or revoked sooner.  Performed at [facility], [place] [address], [place], [place] [address]      Labs: Basic Metabolic Panel: Recent Labs  Lab 01/08/20 0803 01/09/20 0033 01/10/20 0355 01/11/20 0311 01/12/20 0132 01/13/20 0251 01/14/20 0111  NA  --    < > 138 137 139 140 141  K  --    < > 3.5 3.3* 3.8 3.9 3.6  CL  --    < > 113* 111 113* 112* 113*  CO2  --    < > 17* 18* 18* 20* 21*  GLUCOSE  --    < > 114* 138* 136* 113* 104*  BUN  --    < > 9 8 9  6* 5*  CREATININE  --    < > 1.26* 1.24 1.23 1.13 1.21  CALCIUM  --    < > 8.7* 8.5* 8.6* 8.9 8.6*  MG 1.9  --   --  1.9  --  1.9  --    < > = values in this interval not displayed.   Liver Function Tests: Recent Labs  Lab 01/10/20 0355 01/11/20 0311 01/12/20 0132 01/13/20 0251 01/14/20 0111  AST 19 20 23 24 17   ALT 15 18 20 19 15   ALKPHOS 51 50 52 50 56  BILITOT 0.7 1.2 0.8 1.1 1.3*  PROT 5.6* 5.5* 5.6* 5.8* 5.7*  ALBUMIN 2.7* 2.6* 2.5* 2.6* 2.6*   No results for input(s): LIPASE, AMYLASE in the last 168 hours. No results for input(s): AMMONIA in the last 168 hours. CBC: Recent Labs  Lab 01/09/20 0033 01/13/20 0251 01/14/20 0111  WBC 7.5 8.5 8.3  HGB 11.3* 11.4* 11.0*  HCT 32.5* 32.9* 33.6*  MCV 91.5 91.6 92.8  PLT 198 181 185   Cardiac  Enzymes: No results for input(s): CKTOTAL, CKMB, CKMBINDEX, TROPONINI in the last 168 hours. BNP: BNP (last 3 results) Recent Labs    07/11/19 1419  BNP 42.0    ProBNP (last 3 results) No results for input(s): PROBNP in the last 8760 hours.  CBG: Recent Labs  Lab 01/13/20 1119 01/13/20 1617 01/13/20 2139 01/14/20 0632 01/14/20 1114  GLUCAP 131* 100* 91 112* 116*  Signed:  Kayleen Memos, MD [facility] 01/14/2020, 3:22 PM

## 2020-01-14 NOTE — Progress Notes (Signed)
Discharge orders to SNF received.  IV and telemetry removed, CCMD notified.  AVS printed off for PTAR.  Pt is dressed and awaiting transport.

## 2020-01-14 NOTE — TOC Transition Note (Signed)
Transition of Care [facility]) - CM/SW Discharge Note   Patient Details  Name: [name] MRN: [id number] Date of Birth: 1940/07/16  Transition of Care [facility]) CM/SW Contact:  Eduard Roux, LCSWA Phone Number: 01/14/2020, 3:28 PM   Clinical Narrative:     Patient will DC to: [facility]  DC Date:  01/14/2019 Family Notified: Judy,spouse Transport By: Sharin Mons   Per MD patient is ready for discharge. RN, patient, and facility notified of DC. Discharge Summary sent to facility. RN given number for [phone]. Ambulance transport requested for patient.   Clinical Social Worker signing off.  Antony Blackbird, MSW, LCSW Clinical Social Worker    Final next level of care: Skilled Nursing Facility Barriers to Discharge: Barriers Resolved   Patient Goals and CMS Choice Patient states their goals for this hospitalization and ongoing recovery are:: Rehab CMS Medicare.gov Compare Post Acute Care list provided to:: Patient Choice offered to / list presented to : [facility]  Discharge Placement              Patient chooses bed at: [facility] Patient to be transferred to facility by: [facility] Name of family member notified: Judy,spouse Patient and family notified of of transfer: 01/14/20  Discharge Plan and Services In-house Referral: Clinical Social Work   Post Acute Care Choice: Skilled Nursing Facility                               Social Determinants of Health (SDOH) Interventions     Readmission Risk Interventions No flowsheet data found.

## 2020-01-15 DIAGNOSIS — R296 Repeated falls: Secondary | ICD-10-CM | POA: Diagnosis not present

## 2020-01-15 DIAGNOSIS — M1990 Unspecified osteoarthritis, unspecified site: Secondary | ICD-10-CM | POA: Diagnosis not present

## 2020-01-15 DIAGNOSIS — I480 Paroxysmal atrial fibrillation: Secondary | ICD-10-CM | POA: Diagnosis not present

## 2020-01-15 DIAGNOSIS — R059 Cough, unspecified: Secondary | ICD-10-CM | POA: Diagnosis not present

## 2020-01-15 DIAGNOSIS — N289 Disorder of kidney and ureter, unspecified: Secondary | ICD-10-CM | POA: Diagnosis not present

## 2020-01-15 DIAGNOSIS — G9341 Metabolic encephalopathy: Secondary | ICD-10-CM | POA: Diagnosis not present

## 2020-01-15 DIAGNOSIS — R5381 Other malaise: Secondary | ICD-10-CM | POA: Diagnosis not present

## 2020-01-15 DIAGNOSIS — H409 Unspecified glaucoma: Secondary | ICD-10-CM | POA: Diagnosis not present

## 2020-01-15 DIAGNOSIS — I6789 Other cerebrovascular disease: Secondary | ICD-10-CM | POA: Diagnosis not present

## 2020-01-15 DIAGNOSIS — R2681 Unsteadiness on feet: Secondary | ICD-10-CM | POA: Diagnosis not present

## 2020-01-15 DIAGNOSIS — E1169 Type 2 diabetes mellitus with other specified complication: Secondary | ICD-10-CM | POA: Diagnosis not present

## 2020-01-15 DIAGNOSIS — I4891 Unspecified atrial fibrillation: Secondary | ICD-10-CM | POA: Diagnosis not present

## 2020-01-15 DIAGNOSIS — M6281 Muscle weakness (generalized): Secondary | ICD-10-CM | POA: Diagnosis not present

## 2020-01-15 DIAGNOSIS — I10 Essential (primary) hypertension: Secondary | ICD-10-CM | POA: Diagnosis not present

## 2020-01-15 DIAGNOSIS — G8929 Other chronic pain: Secondary | ICD-10-CM | POA: Diagnosis not present

## 2020-01-15 DIAGNOSIS — E0841 Diabetes mellitus due to underlying condition with diabetic mononeuropathy: Secondary | ICD-10-CM | POA: Diagnosis not present

## 2020-01-15 DIAGNOSIS — N178 Other acute kidney failure: Secondary | ICD-10-CM | POA: Diagnosis not present

## 2020-01-15 DIAGNOSIS — G4733 Obstructive sleep apnea (adult) (pediatric): Secondary | ICD-10-CM | POA: Diagnosis not present

## 2020-01-15 DIAGNOSIS — N179 Acute kidney failure, unspecified: Secondary | ICD-10-CM | POA: Diagnosis not present

## 2020-01-16 DIAGNOSIS — I10 Essential (primary) hypertension: Secondary | ICD-10-CM | POA: Diagnosis not present

## 2020-01-16 DIAGNOSIS — Z7189 Other specified counseling: Secondary | ICD-10-CM | POA: Diagnosis not present

## 2020-01-16 DIAGNOSIS — E1169 Type 2 diabetes mellitus with other specified complication: Secondary | ICD-10-CM | POA: Diagnosis not present

## 2020-01-16 DIAGNOSIS — G9341 Metabolic encephalopathy: Secondary | ICD-10-CM | POA: Diagnosis not present

## 2020-01-19 DIAGNOSIS — G4709 Other insomnia: Secondary | ICD-10-CM | POA: Diagnosis not present

## 2020-01-19 DIAGNOSIS — I10 Essential (primary) hypertension: Secondary | ICD-10-CM | POA: Diagnosis not present

## 2020-01-19 DIAGNOSIS — G8929 Other chronic pain: Secondary | ICD-10-CM | POA: Diagnosis not present

## 2020-01-19 DIAGNOSIS — Z7189 Other specified counseling: Secondary | ICD-10-CM | POA: Diagnosis not present

## 2020-01-20 DIAGNOSIS — I10 Essential (primary) hypertension: Secondary | ICD-10-CM | POA: Diagnosis not present

## 2020-01-20 DIAGNOSIS — R600 Localized edema: Secondary | ICD-10-CM | POA: Diagnosis not present

## 2020-01-21 DIAGNOSIS — M6281 Muscle weakness (generalized): Secondary | ICD-10-CM | POA: Diagnosis not present

## 2020-01-21 DIAGNOSIS — E1169 Type 2 diabetes mellitus with other specified complication: Secondary | ICD-10-CM | POA: Diagnosis not present

## 2020-01-21 DIAGNOSIS — I4891 Unspecified atrial fibrillation: Secondary | ICD-10-CM | POA: Diagnosis not present

## 2020-01-21 DIAGNOSIS — G4733 Obstructive sleep apnea (adult) (pediatric): Secondary | ICD-10-CM | POA: Diagnosis not present

## 2020-01-21 DIAGNOSIS — R296 Repeated falls: Secondary | ICD-10-CM | POA: Diagnosis not present

## 2020-01-21 DIAGNOSIS — M1990 Unspecified osteoarthritis, unspecified site: Secondary | ICD-10-CM | POA: Diagnosis not present

## 2020-01-21 DIAGNOSIS — N179 Acute kidney failure, unspecified: Secondary | ICD-10-CM | POA: Diagnosis not present

## 2020-01-21 DIAGNOSIS — Z599 Problem related to housing and economic circumstances, unspecified: Secondary | ICD-10-CM | POA: Diagnosis not present

## 2020-01-21 DIAGNOSIS — H409 Unspecified glaucoma: Secondary | ICD-10-CM | POA: Diagnosis not present

## 2020-01-21 DIAGNOSIS — R2681 Unsteadiness on feet: Secondary | ICD-10-CM | POA: Diagnosis not present

## 2020-01-21 DIAGNOSIS — I10 Essential (primary) hypertension: Secondary | ICD-10-CM | POA: Diagnosis not present

## 2020-01-21 DIAGNOSIS — G8929 Other chronic pain: Secondary | ICD-10-CM | POA: Diagnosis not present

## 2020-01-21 DIAGNOSIS — G9341 Metabolic encephalopathy: Secondary | ICD-10-CM | POA: Diagnosis not present

## 2020-01-22 DIAGNOSIS — Z13228 Encounter for screening for other metabolic disorders: Secondary | ICD-10-CM | POA: Diagnosis not present

## 2020-01-22 DIAGNOSIS — R799 Abnormal finding of blood chemistry, unspecified: Secondary | ICD-10-CM | POA: Diagnosis not present

## 2020-01-23 DIAGNOSIS — G4709 Other insomnia: Secondary | ICD-10-CM | POA: Diagnosis not present

## 2020-01-23 DIAGNOSIS — E0841 Diabetes mellitus due to underlying condition with diabetic mononeuropathy: Secondary | ICD-10-CM | POA: Diagnosis not present

## 2020-01-23 DIAGNOSIS — I6789 Other cerebrovascular disease: Secondary | ICD-10-CM | POA: Diagnosis not present

## 2020-01-23 DIAGNOSIS — I872 Venous insufficiency (chronic) (peripheral): Secondary | ICD-10-CM | POA: Diagnosis not present

## 2020-01-23 DIAGNOSIS — M1388 Other specified arthritis, other site: Secondary | ICD-10-CM | POA: Diagnosis not present

## 2020-01-23 DIAGNOSIS — R5381 Other malaise: Secondary | ICD-10-CM | POA: Diagnosis not present

## 2020-01-23 DIAGNOSIS — R600 Localized edema: Secondary | ICD-10-CM | POA: Diagnosis not present

## 2020-01-23 DIAGNOSIS — I10 Essential (primary) hypertension: Secondary | ICD-10-CM | POA: Diagnosis not present

## 2020-01-28 ENCOUNTER — Telehealth: Payer: Self-pay | Admitting: Cardiology

## 2020-01-28 DIAGNOSIS — I951 Orthostatic hypotension: Secondary | ICD-10-CM | POA: Diagnosis not present

## 2020-01-28 DIAGNOSIS — I480 Paroxysmal atrial fibrillation: Secondary | ICD-10-CM | POA: Diagnosis not present

## 2020-01-28 DIAGNOSIS — E860 Dehydration: Secondary | ICD-10-CM | POA: Diagnosis not present

## 2020-01-28 DIAGNOSIS — N179 Acute kidney failure, unspecified: Secondary | ICD-10-CM | POA: Diagnosis not present

## 2020-01-28 DIAGNOSIS — G928 Other toxic encephalopathy: Secondary | ICD-10-CM | POA: Diagnosis not present

## 2020-01-28 MED ORDER — APIXABAN 5 MG PO TABS
5.0000 mg | ORAL_TABLET | Freq: Two times a day (BID) | ORAL | 3 refills | Status: DC
Start: 2020-01-28 — End: 2021-02-27

## 2020-01-28 NOTE — Telephone Encounter (Signed)
Per hospital discharge summary patient was started on Eliquis 5mg  BID Prescription sent to preferred pharmacy and appointment moved up to 01/29/20 per DC summary to be seen by cards in 1-2 weeks. Wife verbalized understanding.

## 2020-01-28 NOTE — Telephone Encounter (Signed)
Patients wife is calling stating that he received a prescription of apixaban (ELIQUIS) 5 MG TABS tablet when he was discharged from the hospital on 01/14/2020, she states that he is out of this medication and would like to know if she should have it refilled. Please advise.

## 2020-01-29 ENCOUNTER — Ambulatory Visit: Payer: Medicare Other | Admitting: Cardiology

## 2020-01-29 DIAGNOSIS — I951 Orthostatic hypotension: Secondary | ICD-10-CM | POA: Diagnosis not present

## 2020-01-29 DIAGNOSIS — E860 Dehydration: Secondary | ICD-10-CM | POA: Diagnosis not present

## 2020-01-29 DIAGNOSIS — I480 Paroxysmal atrial fibrillation: Secondary | ICD-10-CM | POA: Diagnosis not present

## 2020-01-29 DIAGNOSIS — N179 Acute kidney failure, unspecified: Secondary | ICD-10-CM | POA: Diagnosis not present

## 2020-01-29 DIAGNOSIS — G928 Other toxic encephalopathy: Secondary | ICD-10-CM | POA: Diagnosis not present

## 2020-02-02 DIAGNOSIS — E860 Dehydration: Secondary | ICD-10-CM | POA: Diagnosis not present

## 2020-02-02 DIAGNOSIS — I951 Orthostatic hypotension: Secondary | ICD-10-CM | POA: Diagnosis not present

## 2020-02-02 DIAGNOSIS — G928 Other toxic encephalopathy: Secondary | ICD-10-CM | POA: Diagnosis not present

## 2020-02-02 DIAGNOSIS — I480 Paroxysmal atrial fibrillation: Secondary | ICD-10-CM | POA: Diagnosis not present

## 2020-02-02 DIAGNOSIS — N179 Acute kidney failure, unspecified: Secondary | ICD-10-CM | POA: Diagnosis not present

## 2020-02-03 DIAGNOSIS — N179 Acute kidney failure, unspecified: Secondary | ICD-10-CM | POA: Diagnosis not present

## 2020-02-03 DIAGNOSIS — E860 Dehydration: Secondary | ICD-10-CM | POA: Diagnosis not present

## 2020-02-03 DIAGNOSIS — I480 Paroxysmal atrial fibrillation: Secondary | ICD-10-CM | POA: Diagnosis not present

## 2020-02-03 DIAGNOSIS — I951 Orthostatic hypotension: Secondary | ICD-10-CM | POA: Diagnosis not present

## 2020-02-03 DIAGNOSIS — G928 Other toxic encephalopathy: Secondary | ICD-10-CM | POA: Diagnosis not present

## 2020-02-04 DIAGNOSIS — E860 Dehydration: Secondary | ICD-10-CM | POA: Diagnosis not present

## 2020-02-04 DIAGNOSIS — Z7984 Long term (current) use of oral hypoglycemic drugs: Secondary | ICD-10-CM | POA: Diagnosis not present

## 2020-02-04 DIAGNOSIS — I951 Orthostatic hypotension: Secondary | ICD-10-CM | POA: Diagnosis not present

## 2020-02-04 DIAGNOSIS — R6889 Other general symptoms and signs: Secondary | ICD-10-CM | POA: Diagnosis not present

## 2020-02-04 DIAGNOSIS — G928 Other toxic encephalopathy: Secondary | ICD-10-CM | POA: Diagnosis not present

## 2020-02-04 DIAGNOSIS — I480 Paroxysmal atrial fibrillation: Secondary | ICD-10-CM | POA: Diagnosis not present

## 2020-02-04 DIAGNOSIS — R531 Weakness: Secondary | ICD-10-CM | POA: Diagnosis not present

## 2020-02-04 DIAGNOSIS — E1122 Type 2 diabetes mellitus with diabetic chronic kidney disease: Secondary | ICD-10-CM | POA: Diagnosis not present

## 2020-02-04 DIAGNOSIS — N179 Acute kidney failure, unspecified: Secondary | ICD-10-CM | POA: Diagnosis not present

## 2020-02-05 DIAGNOSIS — I480 Paroxysmal atrial fibrillation: Secondary | ICD-10-CM | POA: Diagnosis not present

## 2020-02-05 DIAGNOSIS — E860 Dehydration: Secondary | ICD-10-CM | POA: Diagnosis not present

## 2020-02-05 DIAGNOSIS — G928 Other toxic encephalopathy: Secondary | ICD-10-CM | POA: Diagnosis not present

## 2020-02-05 DIAGNOSIS — I951 Orthostatic hypotension: Secondary | ICD-10-CM | POA: Diagnosis not present

## 2020-02-05 DIAGNOSIS — N179 Acute kidney failure, unspecified: Secondary | ICD-10-CM | POA: Diagnosis not present

## 2020-02-06 ENCOUNTER — Ambulatory Visit: Payer: Medicare Other | Admitting: Cardiology

## 2020-02-09 DIAGNOSIS — I480 Paroxysmal atrial fibrillation: Secondary | ICD-10-CM | POA: Diagnosis not present

## 2020-02-09 DIAGNOSIS — I951 Orthostatic hypotension: Secondary | ICD-10-CM | POA: Diagnosis not present

## 2020-02-09 DIAGNOSIS — E860 Dehydration: Secondary | ICD-10-CM | POA: Diagnosis not present

## 2020-02-09 DIAGNOSIS — N179 Acute kidney failure, unspecified: Secondary | ICD-10-CM | POA: Diagnosis not present

## 2020-02-09 DIAGNOSIS — G928 Other toxic encephalopathy: Secondary | ICD-10-CM | POA: Diagnosis not present

## 2020-02-10 DIAGNOSIS — I480 Paroxysmal atrial fibrillation: Secondary | ICD-10-CM | POA: Diagnosis not present

## 2020-02-10 DIAGNOSIS — N179 Acute kidney failure, unspecified: Secondary | ICD-10-CM | POA: Diagnosis not present

## 2020-02-10 DIAGNOSIS — I951 Orthostatic hypotension: Secondary | ICD-10-CM | POA: Diagnosis not present

## 2020-02-10 DIAGNOSIS — G928 Other toxic encephalopathy: Secondary | ICD-10-CM | POA: Diagnosis not present

## 2020-02-10 DIAGNOSIS — E860 Dehydration: Secondary | ICD-10-CM | POA: Diagnosis not present

## 2020-02-11 DIAGNOSIS — N179 Acute kidney failure, unspecified: Secondary | ICD-10-CM | POA: Diagnosis not present

## 2020-02-11 DIAGNOSIS — I480 Paroxysmal atrial fibrillation: Secondary | ICD-10-CM | POA: Diagnosis not present

## 2020-02-11 DIAGNOSIS — G928 Other toxic encephalopathy: Secondary | ICD-10-CM | POA: Diagnosis not present

## 2020-02-11 DIAGNOSIS — E860 Dehydration: Secondary | ICD-10-CM | POA: Diagnosis not present

## 2020-02-11 DIAGNOSIS — I951 Orthostatic hypotension: Secondary | ICD-10-CM | POA: Diagnosis not present

## 2020-02-12 DIAGNOSIS — I480 Paroxysmal atrial fibrillation: Secondary | ICD-10-CM | POA: Diagnosis not present

## 2020-02-12 DIAGNOSIS — N179 Acute kidney failure, unspecified: Secondary | ICD-10-CM | POA: Diagnosis not present

## 2020-02-12 DIAGNOSIS — G928 Other toxic encephalopathy: Secondary | ICD-10-CM | POA: Diagnosis not present

## 2020-02-12 DIAGNOSIS — I951 Orthostatic hypotension: Secondary | ICD-10-CM | POA: Diagnosis not present

## 2020-02-12 DIAGNOSIS — E860 Dehydration: Secondary | ICD-10-CM | POA: Diagnosis not present

## 2020-02-13 DIAGNOSIS — M6281 Muscle weakness (generalized): Secondary | ICD-10-CM | POA: Diagnosis not present

## 2020-02-16 DIAGNOSIS — E860 Dehydration: Secondary | ICD-10-CM | POA: Diagnosis not present

## 2020-02-16 DIAGNOSIS — G928 Other toxic encephalopathy: Secondary | ICD-10-CM | POA: Diagnosis not present

## 2020-02-16 DIAGNOSIS — I951 Orthostatic hypotension: Secondary | ICD-10-CM | POA: Diagnosis not present

## 2020-02-16 DIAGNOSIS — I480 Paroxysmal atrial fibrillation: Secondary | ICD-10-CM | POA: Diagnosis not present

## 2020-02-16 DIAGNOSIS — N179 Acute kidney failure, unspecified: Secondary | ICD-10-CM | POA: Diagnosis not present

## 2020-02-19 DIAGNOSIS — N179 Acute kidney failure, unspecified: Secondary | ICD-10-CM | POA: Diagnosis not present

## 2020-02-19 DIAGNOSIS — E860 Dehydration: Secondary | ICD-10-CM | POA: Diagnosis not present

## 2020-02-19 DIAGNOSIS — I951 Orthostatic hypotension: Secondary | ICD-10-CM | POA: Diagnosis not present

## 2020-02-19 DIAGNOSIS — G928 Other toxic encephalopathy: Secondary | ICD-10-CM | POA: Diagnosis not present

## 2020-02-19 DIAGNOSIS — I480 Paroxysmal atrial fibrillation: Secondary | ICD-10-CM | POA: Diagnosis not present

## 2020-02-20 DIAGNOSIS — I480 Paroxysmal atrial fibrillation: Secondary | ICD-10-CM | POA: Diagnosis not present

## 2020-02-20 DIAGNOSIS — N179 Acute kidney failure, unspecified: Secondary | ICD-10-CM | POA: Diagnosis not present

## 2020-02-20 DIAGNOSIS — I951 Orthostatic hypotension: Secondary | ICD-10-CM | POA: Diagnosis not present

## 2020-02-20 DIAGNOSIS — E860 Dehydration: Secondary | ICD-10-CM | POA: Diagnosis not present

## 2020-02-20 DIAGNOSIS — G928 Other toxic encephalopathy: Secondary | ICD-10-CM | POA: Diagnosis not present

## 2020-02-23 DIAGNOSIS — G928 Other toxic encephalopathy: Secondary | ICD-10-CM | POA: Diagnosis not present

## 2020-02-23 DIAGNOSIS — N179 Acute kidney failure, unspecified: Secondary | ICD-10-CM | POA: Diagnosis not present

## 2020-02-23 DIAGNOSIS — I951 Orthostatic hypotension: Secondary | ICD-10-CM | POA: Diagnosis not present

## 2020-02-23 DIAGNOSIS — E860 Dehydration: Secondary | ICD-10-CM | POA: Diagnosis not present

## 2020-02-23 DIAGNOSIS — I480 Paroxysmal atrial fibrillation: Secondary | ICD-10-CM | POA: Diagnosis not present

## 2020-02-24 DIAGNOSIS — E860 Dehydration: Secondary | ICD-10-CM | POA: Diagnosis not present

## 2020-02-24 DIAGNOSIS — N179 Acute kidney failure, unspecified: Secondary | ICD-10-CM | POA: Diagnosis not present

## 2020-02-24 DIAGNOSIS — I951 Orthostatic hypotension: Secondary | ICD-10-CM | POA: Diagnosis not present

## 2020-02-24 DIAGNOSIS — G928 Other toxic encephalopathy: Secondary | ICD-10-CM | POA: Diagnosis not present

## 2020-02-24 DIAGNOSIS — I480 Paroxysmal atrial fibrillation: Secondary | ICD-10-CM | POA: Diagnosis not present

## 2020-02-25 DIAGNOSIS — E860 Dehydration: Secondary | ICD-10-CM | POA: Diagnosis not present

## 2020-02-25 DIAGNOSIS — I951 Orthostatic hypotension: Secondary | ICD-10-CM | POA: Diagnosis not present

## 2020-02-25 DIAGNOSIS — G928 Other toxic encephalopathy: Secondary | ICD-10-CM | POA: Diagnosis not present

## 2020-02-25 DIAGNOSIS — I480 Paroxysmal atrial fibrillation: Secondary | ICD-10-CM | POA: Diagnosis not present

## 2020-02-25 DIAGNOSIS — N179 Acute kidney failure, unspecified: Secondary | ICD-10-CM | POA: Diagnosis not present

## 2020-03-01 DIAGNOSIS — I951 Orthostatic hypotension: Secondary | ICD-10-CM | POA: Diagnosis not present

## 2020-03-01 DIAGNOSIS — G928 Other toxic encephalopathy: Secondary | ICD-10-CM | POA: Diagnosis not present

## 2020-03-01 DIAGNOSIS — N179 Acute kidney failure, unspecified: Secondary | ICD-10-CM | POA: Diagnosis not present

## 2020-03-01 DIAGNOSIS — E860 Dehydration: Secondary | ICD-10-CM | POA: Diagnosis not present

## 2020-03-01 DIAGNOSIS — I480 Paroxysmal atrial fibrillation: Secondary | ICD-10-CM | POA: Diagnosis not present

## 2020-03-03 DIAGNOSIS — N179 Acute kidney failure, unspecified: Secondary | ICD-10-CM | POA: Diagnosis not present

## 2020-03-03 DIAGNOSIS — E860 Dehydration: Secondary | ICD-10-CM | POA: Diagnosis not present

## 2020-03-03 DIAGNOSIS — I951 Orthostatic hypotension: Secondary | ICD-10-CM | POA: Diagnosis not present

## 2020-03-03 DIAGNOSIS — I480 Paroxysmal atrial fibrillation: Secondary | ICD-10-CM | POA: Diagnosis not present

## 2020-03-03 DIAGNOSIS — G928 Other toxic encephalopathy: Secondary | ICD-10-CM | POA: Diagnosis not present

## 2020-03-04 ENCOUNTER — Ambulatory Visit: Payer: Medicare Other | Admitting: Cardiology

## 2020-03-05 DIAGNOSIS — G928 Other toxic encephalopathy: Secondary | ICD-10-CM | POA: Diagnosis not present

## 2020-03-05 DIAGNOSIS — N179 Acute kidney failure, unspecified: Secondary | ICD-10-CM | POA: Diagnosis not present

## 2020-03-05 DIAGNOSIS — E860 Dehydration: Secondary | ICD-10-CM | POA: Diagnosis not present

## 2020-03-05 DIAGNOSIS — I480 Paroxysmal atrial fibrillation: Secondary | ICD-10-CM | POA: Diagnosis not present

## 2020-03-05 DIAGNOSIS — I951 Orthostatic hypotension: Secondary | ICD-10-CM | POA: Diagnosis not present

## 2020-03-08 DIAGNOSIS — E860 Dehydration: Secondary | ICD-10-CM | POA: Diagnosis not present

## 2020-03-08 DIAGNOSIS — I480 Paroxysmal atrial fibrillation: Secondary | ICD-10-CM | POA: Diagnosis not present

## 2020-03-08 DIAGNOSIS — N179 Acute kidney failure, unspecified: Secondary | ICD-10-CM | POA: Diagnosis not present

## 2020-03-08 DIAGNOSIS — G928 Other toxic encephalopathy: Secondary | ICD-10-CM | POA: Diagnosis not present

## 2020-03-08 DIAGNOSIS — I951 Orthostatic hypotension: Secondary | ICD-10-CM | POA: Diagnosis not present

## 2020-03-09 DIAGNOSIS — I129 Hypertensive chronic kidney disease with stage 1 through stage 4 chronic kidney disease, or unspecified chronic kidney disease: Secondary | ICD-10-CM | POA: Diagnosis not present

## 2020-03-09 DIAGNOSIS — N1831 Chronic kidney disease, stage 3a: Secondary | ICD-10-CM | POA: Diagnosis not present

## 2020-03-09 DIAGNOSIS — E1122 Type 2 diabetes mellitus with diabetic chronic kidney disease: Secondary | ICD-10-CM | POA: Diagnosis not present

## 2020-03-10 DIAGNOSIS — M1712 Unilateral primary osteoarthritis, left knee: Secondary | ICD-10-CM | POA: Diagnosis not present

## 2020-03-10 DIAGNOSIS — M1711 Unilateral primary osteoarthritis, right knee: Secondary | ICD-10-CM | POA: Diagnosis not present

## 2020-03-12 DIAGNOSIS — M6281 Muscle weakness (generalized): Secondary | ICD-10-CM | POA: Diagnosis not present

## 2020-03-15 DIAGNOSIS — G928 Other toxic encephalopathy: Secondary | ICD-10-CM | POA: Diagnosis not present

## 2020-03-15 DIAGNOSIS — N179 Acute kidney failure, unspecified: Secondary | ICD-10-CM | POA: Diagnosis not present

## 2020-03-15 DIAGNOSIS — I480 Paroxysmal atrial fibrillation: Secondary | ICD-10-CM | POA: Diagnosis not present

## 2020-03-15 DIAGNOSIS — E860 Dehydration: Secondary | ICD-10-CM | POA: Diagnosis not present

## 2020-03-15 DIAGNOSIS — I951 Orthostatic hypotension: Secondary | ICD-10-CM | POA: Diagnosis not present

## 2020-03-16 DIAGNOSIS — N179 Acute kidney failure, unspecified: Secondary | ICD-10-CM | POA: Diagnosis not present

## 2020-03-16 DIAGNOSIS — I480 Paroxysmal atrial fibrillation: Secondary | ICD-10-CM | POA: Diagnosis not present

## 2020-03-16 DIAGNOSIS — G928 Other toxic encephalopathy: Secondary | ICD-10-CM | POA: Diagnosis not present

## 2020-03-16 DIAGNOSIS — I951 Orthostatic hypotension: Secondary | ICD-10-CM | POA: Diagnosis not present

## 2020-03-16 DIAGNOSIS — E860 Dehydration: Secondary | ICD-10-CM | POA: Diagnosis not present

## 2020-03-17 DIAGNOSIS — I951 Orthostatic hypotension: Secondary | ICD-10-CM | POA: Diagnosis not present

## 2020-03-17 DIAGNOSIS — E860 Dehydration: Secondary | ICD-10-CM | POA: Diagnosis not present

## 2020-03-17 DIAGNOSIS — N179 Acute kidney failure, unspecified: Secondary | ICD-10-CM | POA: Diagnosis not present

## 2020-03-17 DIAGNOSIS — I480 Paroxysmal atrial fibrillation: Secondary | ICD-10-CM | POA: Diagnosis not present

## 2020-03-17 DIAGNOSIS — G928 Other toxic encephalopathy: Secondary | ICD-10-CM | POA: Diagnosis not present

## 2020-03-19 DIAGNOSIS — I951 Orthostatic hypotension: Secondary | ICD-10-CM | POA: Diagnosis not present

## 2020-03-19 DIAGNOSIS — G928 Other toxic encephalopathy: Secondary | ICD-10-CM | POA: Diagnosis not present

## 2020-03-19 DIAGNOSIS — E860 Dehydration: Secondary | ICD-10-CM | POA: Diagnosis not present

## 2020-03-19 DIAGNOSIS — I480 Paroxysmal atrial fibrillation: Secondary | ICD-10-CM | POA: Diagnosis not present

## 2020-03-19 DIAGNOSIS — N179 Acute kidney failure, unspecified: Secondary | ICD-10-CM | POA: Diagnosis not present

## 2020-03-22 DIAGNOSIS — E860 Dehydration: Secondary | ICD-10-CM | POA: Diagnosis not present

## 2020-03-22 DIAGNOSIS — I951 Orthostatic hypotension: Secondary | ICD-10-CM | POA: Diagnosis not present

## 2020-03-22 DIAGNOSIS — N179 Acute kidney failure, unspecified: Secondary | ICD-10-CM | POA: Diagnosis not present

## 2020-03-22 DIAGNOSIS — G928 Other toxic encephalopathy: Secondary | ICD-10-CM | POA: Diagnosis not present

## 2020-03-22 DIAGNOSIS — I480 Paroxysmal atrial fibrillation: Secondary | ICD-10-CM | POA: Diagnosis not present

## 2020-04-02 DIAGNOSIS — T161XXA Foreign body in right ear, initial encounter: Secondary | ICD-10-CM | POA: Diagnosis not present

## 2020-04-05 DIAGNOSIS — M79644 Pain in right finger(s): Secondary | ICD-10-CM | POA: Diagnosis not present

## 2020-04-09 ENCOUNTER — Inpatient Hospital Stay (HOSPITAL_COMMUNITY)
Admission: EM | Admit: 2020-04-09 | Discharge: 2020-04-14 | DRG: 982 | Disposition: A | Discharge status: Home Health | Payer: Medicare Other | Attending: Internal Medicine | Admitting: Internal Medicine

## 2020-04-09 DIAGNOSIS — E781 Pure hyperglyceridemia: Secondary | ICD-10-CM | POA: Diagnosis present

## 2020-04-09 DIAGNOSIS — M1710 Unilateral primary osteoarthritis, unspecified knee: Secondary | ICD-10-CM | POA: Diagnosis present

## 2020-04-09 DIAGNOSIS — I872 Venous insufficiency (chronic) (peripheral): Secondary | ICD-10-CM | POA: Diagnosis present

## 2020-04-09 DIAGNOSIS — R5381 Other malaise: Secondary | ICD-10-CM | POA: Diagnosis present

## 2020-04-09 DIAGNOSIS — R229 Localized swelling, mass and lump, unspecified: Secondary | ICD-10-CM | POA: Diagnosis present

## 2020-04-09 DIAGNOSIS — M869 Osteomyelitis, unspecified: Secondary | ICD-10-CM | POA: Diagnosis not present

## 2020-04-09 DIAGNOSIS — E1152 Type 2 diabetes mellitus with diabetic peripheral angiopathy with gangrene: Secondary | ICD-10-CM | POA: Diagnosis present

## 2020-04-09 DIAGNOSIS — N182 Chronic kidney disease, stage 2 (mild): Secondary | ICD-10-CM | POA: Diagnosis not present

## 2020-04-09 DIAGNOSIS — E669 Obesity, unspecified: Secondary | ICD-10-CM | POA: Diagnosis present

## 2020-04-09 DIAGNOSIS — Z20822 Contact with and (suspected) exposure to covid-19: Secondary | ICD-10-CM | POA: Diagnosis present

## 2020-04-09 DIAGNOSIS — E1169 Type 2 diabetes mellitus with other specified complication: Principal | ICD-10-CM | POA: Diagnosis present

## 2020-04-09 DIAGNOSIS — F5104 Psychophysiologic insomnia: Secondary | ICD-10-CM | POA: Diagnosis present

## 2020-04-09 DIAGNOSIS — M659 Synovitis and tenosynovitis, unspecified: Secondary | ICD-10-CM | POA: Diagnosis not present

## 2020-04-09 DIAGNOSIS — Z7901 Long term (current) use of anticoagulants: Secondary | ICD-10-CM

## 2020-04-09 DIAGNOSIS — G4733 Obstructive sleep apnea (adult) (pediatric): Secondary | ICD-10-CM | POA: Diagnosis present

## 2020-04-09 DIAGNOSIS — Z79899 Other long term (current) drug therapy: Secondary | ICD-10-CM

## 2020-04-09 DIAGNOSIS — Z87891 Personal history of nicotine dependence: Secondary | ICD-10-CM | POA: Diagnosis not present

## 2020-04-09 DIAGNOSIS — N179 Acute kidney failure, unspecified: Secondary | ICD-10-CM | POA: Diagnosis not present

## 2020-04-09 DIAGNOSIS — I129 Hypertensive chronic kidney disease with stage 1 through stage 4 chronic kidney disease, or unspecified chronic kidney disease: Secondary | ICD-10-CM | POA: Diagnosis not present

## 2020-04-09 DIAGNOSIS — I480 Paroxysmal atrial fibrillation: Secondary | ICD-10-CM | POA: Diagnosis present

## 2020-04-09 DIAGNOSIS — Z86010 Personal history of colonic polyps: Secondary | ICD-10-CM

## 2020-04-09 DIAGNOSIS — L03113 Cellulitis of right upper limb: Secondary | ICD-10-CM | POA: Diagnosis not present

## 2020-04-09 DIAGNOSIS — Z886 Allergy status to analgesic agent status: Secondary | ICD-10-CM | POA: Diagnosis not present

## 2020-04-09 DIAGNOSIS — E872 Acidosis: Secondary | ICD-10-CM | POA: Diagnosis not present

## 2020-04-09 DIAGNOSIS — L089 Local infection of the skin and subcutaneous tissue, unspecified: Secondary | ICD-10-CM | POA: Diagnosis not present

## 2020-04-09 DIAGNOSIS — I96 Gangrene, not elsewhere classified: Secondary | ICD-10-CM | POA: Diagnosis not present

## 2020-04-09 DIAGNOSIS — E1122 Type 2 diabetes mellitus with diabetic chronic kidney disease: Secondary | ICD-10-CM | POA: Diagnosis present

## 2020-04-09 DIAGNOSIS — S61204A Unspecified open wound of right ring finger without damage to nail, initial encounter: Secondary | ICD-10-CM

## 2020-04-09 DIAGNOSIS — G8929 Other chronic pain: Secondary | ICD-10-CM | POA: Diagnosis present

## 2020-04-09 DIAGNOSIS — M7989 Other specified soft tissue disorders: Secondary | ICD-10-CM | POA: Diagnosis not present

## 2020-04-09 DIAGNOSIS — M86141 Other acute osteomyelitis, right hand: Secondary | ICD-10-CM | POA: Diagnosis not present

## 2020-04-09 DIAGNOSIS — L98499 Non-pressure chronic ulcer of skin of other sites with unspecified severity: Secondary | ICD-10-CM | POA: Diagnosis not present

## 2020-04-09 DIAGNOSIS — E1142 Type 2 diabetes mellitus with diabetic polyneuropathy: Secondary | ICD-10-CM | POA: Diagnosis present

## 2020-04-09 DIAGNOSIS — S61401A Unspecified open wound of right hand, initial encounter: Secondary | ICD-10-CM | POA: Diagnosis not present

## 2020-04-09 DIAGNOSIS — L02511 Cutaneous abscess of right hand: Secondary | ICD-10-CM | POA: Diagnosis not present

## 2020-04-09 DIAGNOSIS — Z6836 Body mass index (BMI) 36.0-36.9, adult: Secondary | ICD-10-CM | POA: Diagnosis not present

## 2020-04-09 DIAGNOSIS — Z888 Allergy status to other drugs, medicaments and biological substances status: Secondary | ICD-10-CM

## 2020-04-09 DIAGNOSIS — S51801A Unspecified open wound of right forearm, initial encounter: Secondary | ICD-10-CM

## 2020-04-09 LAB — COMPREHENSIVE METABOLIC PANEL
ALT: 39 U/L (ref 0–44)
AST: 31 U/L (ref 15–41)
Albumin: 2.9 g/dL — ABNORMAL LOW (ref 3.5–5.0)
Alkaline Phosphatase: 87 U/L (ref 38–126)
Anion gap: 9 (ref 5–15)
BUN: 13 mg/dL (ref 8–23)
CO2: 23 mmol/L (ref 22–32)
Calcium: 9.8 mg/dL (ref 8.9–10.3)
Chloride: 101 mmol/L (ref 98–111)
Creatinine, Ser: 1.21 mg/dL (ref 0.61–1.24)
GFR, Estimated: >60 mL/min (ref >60)
Glucose, Bld: 110 mg/dL — ABNORMAL HIGH (ref 70–99)
Potassium: 4.5 mmol/L (ref 3.5–5.1)
Sodium: 133 mmol/L — ABNORMAL LOW (ref 135–145)
Total Bilirubin: 0.7 mg/dL (ref 0.3–1.2)
Total Protein: 7.3 g/dL (ref 6.5–8.1)

## 2020-04-09 LAB — CBC WITH DIFFERENTIAL/PLATELET
Abs Immature Granulocytes: 0.08 10*3/uL — ABNORMAL HIGH (ref 0.00–0.07)
Basophils Absolute: 0 10*3/uL (ref 0.0–0.1)
Basophils Relative: 0 %
Eosinophils Absolute: 0.3 10*3/uL (ref 0.0–0.5)
Eosinophils Relative: 2 %
HCT: 42.7 % (ref 39.0–52.0)
Hemoglobin: 13.7 g/dL (ref 13.0–17.0)
Immature Granulocytes: 1 %
Lymphocytes Relative: 13 %
Lymphs Abs: 1.8 10*3/uL (ref 0.7–4.0)
MCH: 30.2 pg (ref 26.0–34.0)
MCHC: 32.1 g/dL (ref 30.0–36.0)
MCV: 94.1 fL (ref 80.0–100.0)
Monocytes Absolute: 1.1 10*3/uL — ABNORMAL HIGH (ref 0.1–1.0)
Monocytes Relative: 9 %
Neutro Abs: 10.1 10*3/uL — ABNORMAL HIGH (ref 1.7–7.7)
Neutrophils Relative %: 75 %
Platelets: 385 10*3/uL (ref 150–400)
RBC: 4.54 MIL/uL (ref 4.22–5.81)
RDW: 14.4 % (ref 11.5–15.5)
WBC: 13.4 10*3/uL — ABNORMAL HIGH (ref 4.0–10.5)
nRBC: 0 % (ref 0.0–0.2)

## 2020-04-09 LAB — LACTIC ACID, PLASMA: Lactic Acid, Venous: 2.5 mmol/L (ref 0.5–1.9)

## 2020-04-09 NOTE — ED Notes (Signed)
Critical lactic 2.5 notified charge
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
## 2020-04-10 ENCOUNTER — Other Ambulatory Visit: Payer: Self-pay

## 2020-04-10 ENCOUNTER — Encounter (HOSPITAL_COMMUNITY)
Admission: EM | Disposition: A | Discharge status: Home Health | Payer: Self-pay | Source: Home / Self Care | Attending: Internal Medicine

## 2020-04-10 ENCOUNTER — Inpatient Hospital Stay (HOSPITAL_COMMUNITY): Payer: Medicare Other | Admitting: Anesthesiology

## 2020-04-10 ENCOUNTER — Emergency Department (HOSPITAL_COMMUNITY): Payer: Medicare Other

## 2020-04-10 ENCOUNTER — Inpatient Hospital Stay (HOSPITAL_COMMUNITY): Payer: Medicare Other

## 2020-04-10 ENCOUNTER — Encounter (HOSPITAL_COMMUNITY): Payer: Self-pay | Admitting: Internal Medicine

## 2020-04-10 DIAGNOSIS — Z6836 Body mass index (BMI) 36.0-36.9, adult: Secondary | ICD-10-CM | POA: Diagnosis not present

## 2020-04-10 DIAGNOSIS — S61204A Unspecified open wound of right ring finger without damage to nail, initial encounter: Secondary | ICD-10-CM | POA: Diagnosis not present

## 2020-04-10 DIAGNOSIS — E1142 Type 2 diabetes mellitus with diabetic polyneuropathy: Secondary | ICD-10-CM | POA: Diagnosis present

## 2020-04-10 DIAGNOSIS — E1152 Type 2 diabetes mellitus with diabetic peripheral angiopathy with gangrene: Secondary | ICD-10-CM | POA: Diagnosis present

## 2020-04-10 DIAGNOSIS — M869 Osteomyelitis, unspecified: Secondary | ICD-10-CM

## 2020-04-10 DIAGNOSIS — G4733 Obstructive sleep apnea (adult) (pediatric): Secondary | ICD-10-CM | POA: Diagnosis present

## 2020-04-10 DIAGNOSIS — R229 Localized swelling, mass and lump, unspecified: Secondary | ICD-10-CM | POA: Diagnosis present

## 2020-04-10 DIAGNOSIS — Z87891 Personal history of nicotine dependence: Secondary | ICD-10-CM | POA: Diagnosis not present

## 2020-04-10 DIAGNOSIS — E669 Obesity, unspecified: Secondary | ICD-10-CM

## 2020-04-10 DIAGNOSIS — I480 Paroxysmal atrial fibrillation: Secondary | ICD-10-CM | POA: Diagnosis present

## 2020-04-10 DIAGNOSIS — L089 Local infection of the skin and subcutaneous tissue, unspecified: Secondary | ICD-10-CM | POA: Diagnosis not present

## 2020-04-10 DIAGNOSIS — N182 Chronic kidney disease, stage 2 (mild): Secondary | ICD-10-CM | POA: Diagnosis present

## 2020-04-10 DIAGNOSIS — Z886 Allergy status to analgesic agent status: Secondary | ICD-10-CM | POA: Diagnosis not present

## 2020-04-10 DIAGNOSIS — E1169 Type 2 diabetes mellitus with other specified complication: Secondary | ICD-10-CM | POA: Diagnosis present

## 2020-04-10 DIAGNOSIS — N179 Acute kidney failure, unspecified: Secondary | ICD-10-CM | POA: Diagnosis present

## 2020-04-10 DIAGNOSIS — M659 Synovitis and tenosynovitis, unspecified: Secondary | ICD-10-CM | POA: Diagnosis present

## 2020-04-10 DIAGNOSIS — I129 Hypertensive chronic kidney disease with stage 1 through stage 4 chronic kidney disease, or unspecified chronic kidney disease: Secondary | ICD-10-CM | POA: Diagnosis present

## 2020-04-10 DIAGNOSIS — E872 Acidosis: Secondary | ICD-10-CM | POA: Diagnosis present

## 2020-04-10 DIAGNOSIS — I872 Venous insufficiency (chronic) (peripheral): Secondary | ICD-10-CM | POA: Diagnosis present

## 2020-04-10 DIAGNOSIS — R5381 Other malaise: Secondary | ICD-10-CM | POA: Diagnosis present

## 2020-04-10 DIAGNOSIS — Z888 Allergy status to other drugs, medicaments and biological substances status: Secondary | ICD-10-CM | POA: Diagnosis not present

## 2020-04-10 DIAGNOSIS — M86141 Other acute osteomyelitis, right hand: Secondary | ICD-10-CM

## 2020-04-10 DIAGNOSIS — E1122 Type 2 diabetes mellitus with diabetic chronic kidney disease: Secondary | ICD-10-CM | POA: Diagnosis present

## 2020-04-10 DIAGNOSIS — M1710 Unilateral primary osteoarthritis, unspecified knee: Secondary | ICD-10-CM | POA: Diagnosis present

## 2020-04-10 DIAGNOSIS — G8929 Other chronic pain: Secondary | ICD-10-CM | POA: Diagnosis present

## 2020-04-10 DIAGNOSIS — Z20822 Contact with and (suspected) exposure to covid-19: Secondary | ICD-10-CM | POA: Diagnosis present

## 2020-04-10 DIAGNOSIS — E781 Pure hyperglyceridemia: Secondary | ICD-10-CM | POA: Diagnosis present

## 2020-04-10 DIAGNOSIS — F5104 Psychophysiologic insomnia: Secondary | ICD-10-CM | POA: Diagnosis present

## 2020-04-10 HISTORY — PX: I & D EXTREMITY: SHX5045

## 2020-04-10 LAB — CBC
HCT: 35.3 % — ABNORMAL LOW (ref 39.0–52.0)
Hemoglobin: 11.4 g/dL — ABNORMAL LOW (ref 13.0–17.0)
MCH: 29.6 pg (ref 26.0–34.0)
MCHC: 32.3 g/dL (ref 30.0–36.0)
MCV: 91.7 fL (ref 80.0–100.0)
Platelets: 292 10*3/uL (ref 150–400)
RBC: 3.85 MIL/uL — ABNORMAL LOW (ref 4.22–5.81)
RDW: 14.4 % (ref 11.5–15.5)
WBC: 11.7 10*3/uL — ABNORMAL HIGH (ref 4.0–10.5)
nRBC: 0 % (ref 0.0–0.2)

## 2020-04-10 LAB — CBC WITH DIFFERENTIAL/PLATELET
Abs Immature Granulocytes: 0.09 10*3/uL — ABNORMAL HIGH (ref 0.00–0.07)
Basophils Absolute: 0 10*3/uL (ref 0.0–0.1)
Basophils Relative: 0 %
Eosinophils Absolute: 0.2 10*3/uL (ref 0.0–0.5)
Eosinophils Relative: 2 %
HCT: 34.3 % — ABNORMAL LOW (ref 39.0–52.0)
Hemoglobin: 11 g/dL — ABNORMAL LOW (ref 13.0–17.0)
Immature Granulocytes: 1 %
Lymphocytes Relative: 17 %
Lymphs Abs: 1.7 10*3/uL (ref 0.7–4.0)
MCH: 29.6 pg (ref 26.0–34.0)
MCHC: 32.1 g/dL (ref 30.0–36.0)
MCV: 92.2 fL (ref 80.0–100.0)
Monocytes Absolute: 1.1 10*3/uL — ABNORMAL HIGH (ref 0.1–1.0)
Monocytes Relative: 10 %
Neutro Abs: 7.4 10*3/uL (ref 1.7–7.7)
Neutrophils Relative %: 70 %
Platelets: 279 10*3/uL (ref 150–400)
RBC: 3.72 MIL/uL — ABNORMAL LOW (ref 4.22–5.81)
RDW: 14.3 % (ref 11.5–15.5)
WBC: 10.4 10*3/uL (ref 4.0–10.5)
nRBC: 0 % (ref 0.0–0.2)

## 2020-04-10 LAB — BASIC METABOLIC PANEL WITH GFR
Anion gap: 12 (ref 5–15)
BUN: 17 mg/dL (ref 8–23)
CO2: 21 mmol/L — ABNORMAL LOW (ref 22–32)
Calcium: 9.1 mg/dL (ref 8.9–10.3)
Chloride: 101 mmol/L (ref 98–111)
Creatinine, Ser: 1.51 mg/dL — ABNORMAL HIGH (ref 0.61–1.24)
GFR, Estimated: 46 mL/min — ABNORMAL LOW
Glucose, Bld: 116 mg/dL — ABNORMAL HIGH (ref 70–99)
Potassium: 4.7 mmol/L (ref 3.5–5.1)
Sodium: 134 mmol/L — ABNORMAL LOW (ref 135–145)

## 2020-04-10 LAB — GLUCOSE, CAPILLARY
Glucose-Capillary: 101 mg/dL — ABNORMAL HIGH (ref 70–99)
Glucose-Capillary: 115 mg/dL — ABNORMAL HIGH (ref 70–99)
Glucose-Capillary: 129 mg/dL — ABNORMAL HIGH (ref 70–99)
Glucose-Capillary: 162 mg/dL — ABNORMAL HIGH (ref 70–99)
Glucose-Capillary: 162 mg/dL — ABNORMAL HIGH (ref 70–99)
Glucose-Capillary: 97 mg/dL (ref 70–99)
Glucose-Capillary: 97 mg/dL (ref 70–99)

## 2020-04-10 LAB — MRSA PCR SCREENING: MRSA by PCR: NEGATIVE

## 2020-04-10 LAB — PROTIME-INR
INR: 1.3 — ABNORMAL HIGH (ref 0.8–1.2)
Prothrombin Time: 15.4 seconds — ABNORMAL HIGH (ref 11.4–15.2)

## 2020-04-10 LAB — RESP PANEL BY RT-PCR (FLU A&B, COVID) ARPGX2
Influenza A by PCR: NEGATIVE
Influenza B by PCR: NEGATIVE
SARS Coronavirus 2 by RT PCR: NEGATIVE

## 2020-04-10 LAB — LACTIC ACID, PLASMA: Lactic Acid, Venous: 2.4 mmol/L (ref 0.5–1.9)

## 2020-04-10 LAB — HEMOGLOBIN A1C
Hgb A1c MFr Bld: 6.9 % — ABNORMAL HIGH (ref 4.8–5.6)
Mean Plasma Glucose: 151.33 mg/dL

## 2020-04-10 IMAGING — DX DG HAND COMPLETE 3+V*R*
3 series · 3 of 3 positions shown · non-contrast
Comparison: None.

CLINICAL DATA: Right hand wound

EXAM:
RIGHT HAND - COMPLETE 3+ VIEW

[hand pa]
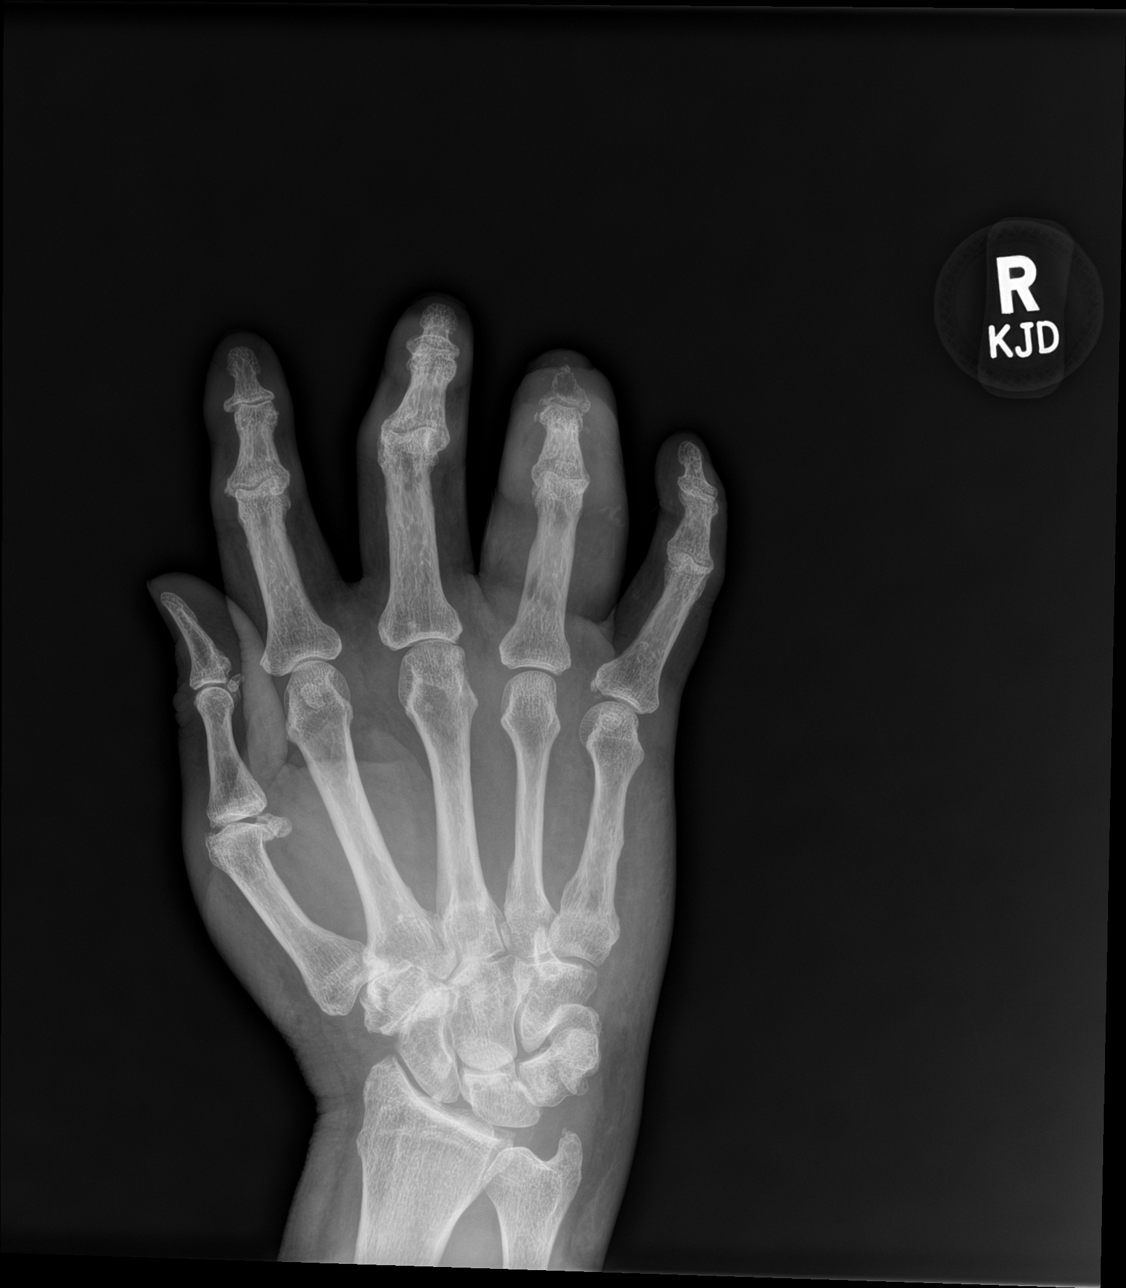

[hand obl]
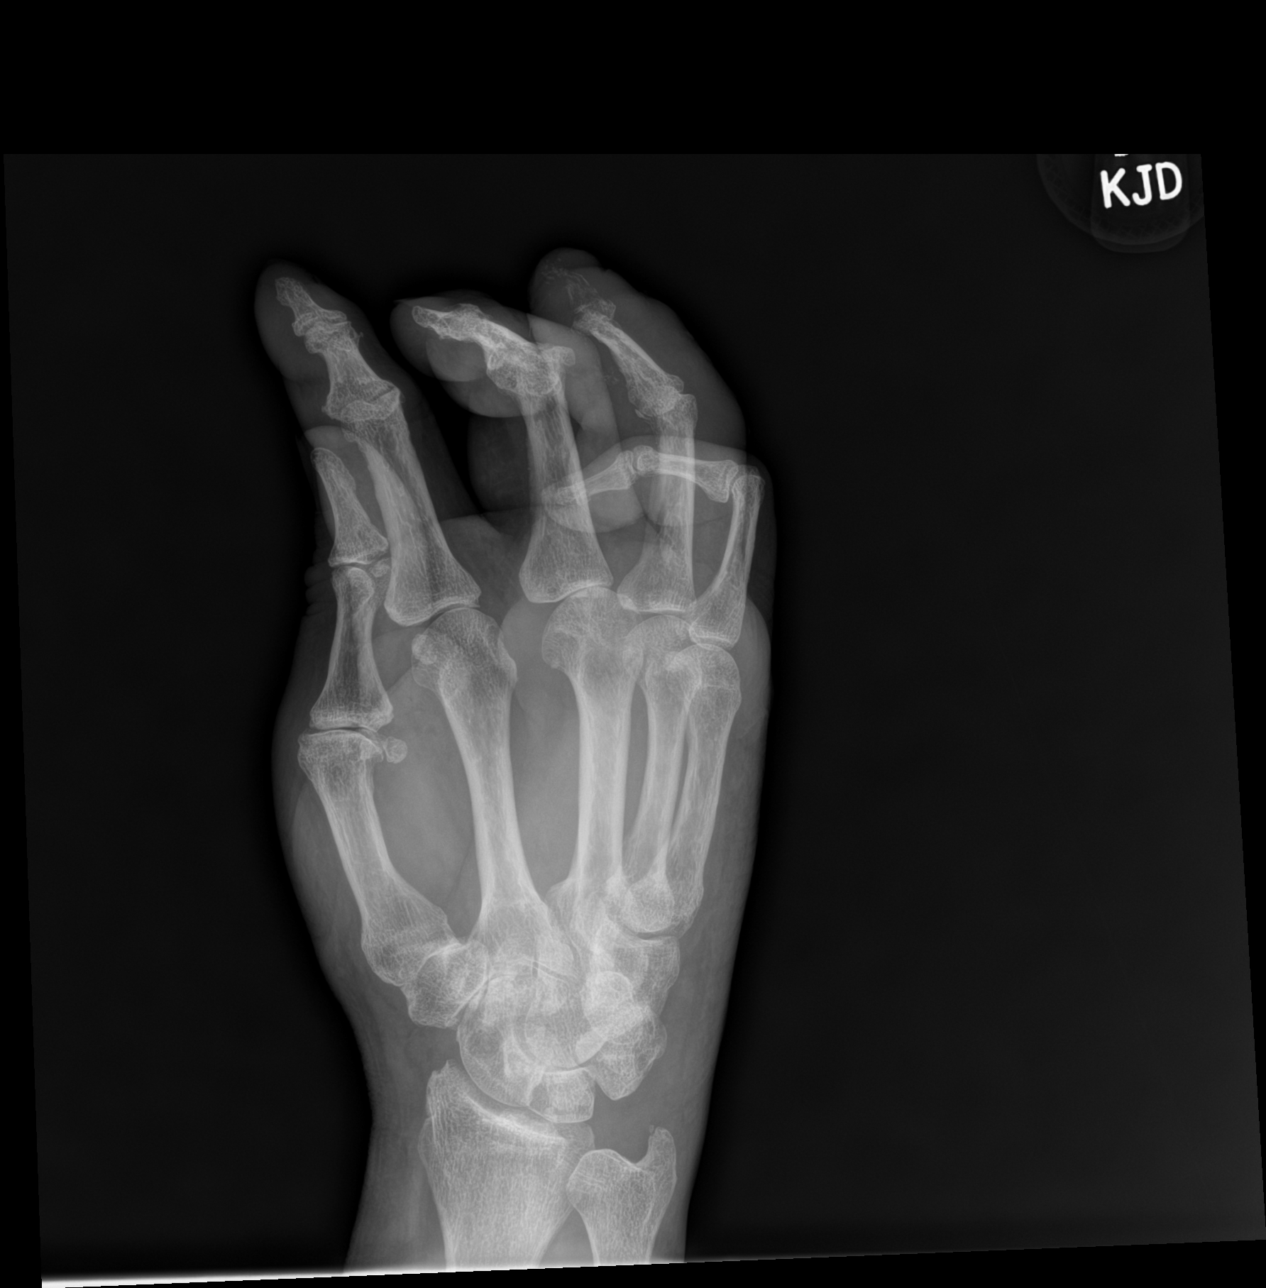

[hand lat]
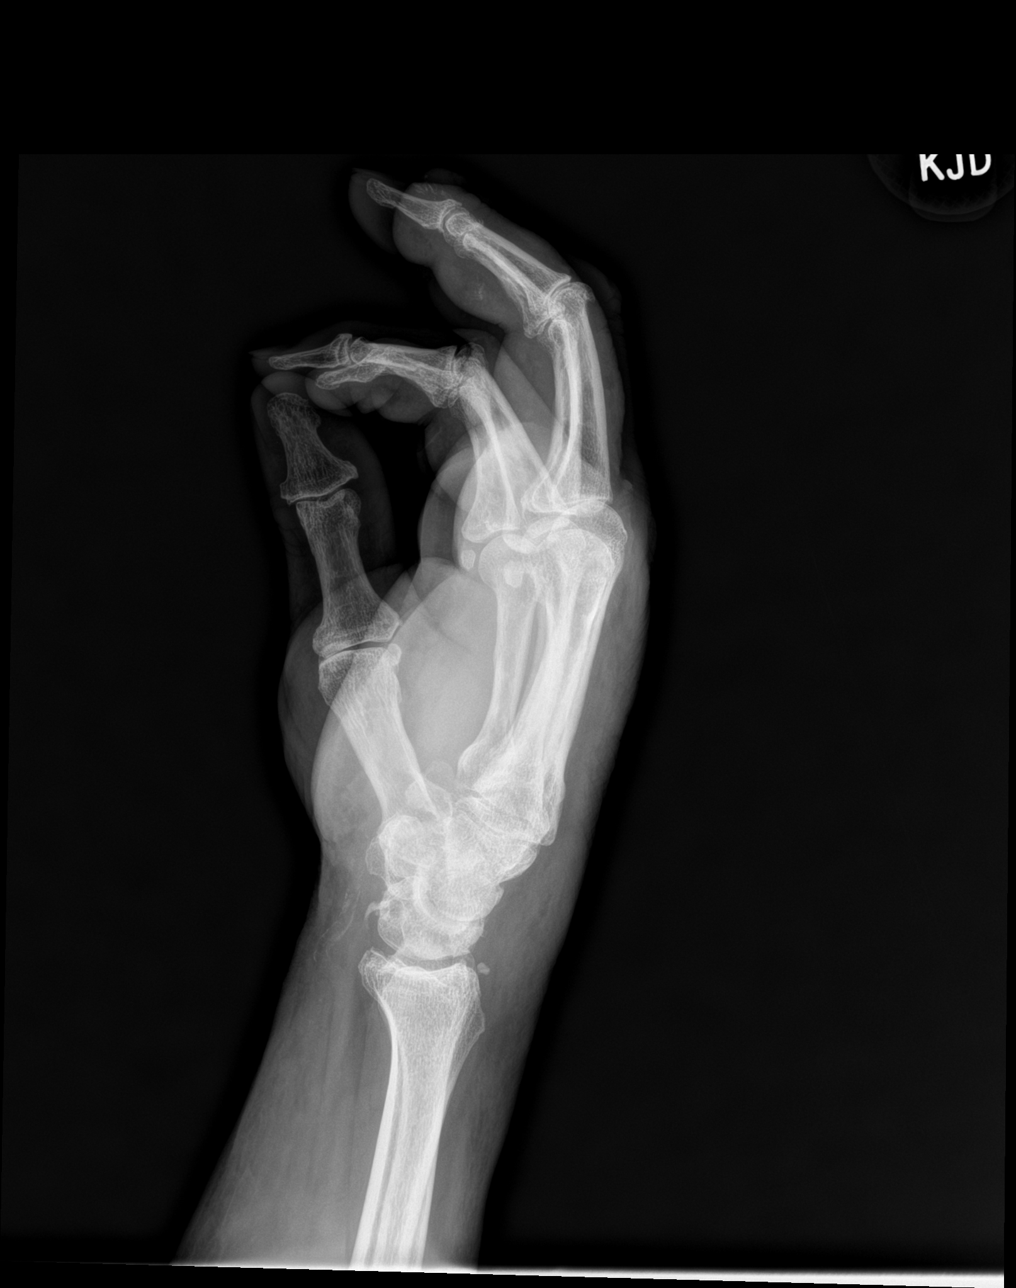

[3 of 3 positions shown; findings below may reference images not displayed]

FINDINGS: Soft tissue swelling in the right ring finger. Bone destruction in
the distal phalanx concerning for osteomyelitis. Moderate to
advanced degenerative changes in the IP joints and 1st
carpometacarpal joint.
IMPRESSION: Lucency in the distal phalanx of the right ring finger with bone
destruction concerning for osteomyelitis. Diffuse soft tissue
swelling throughout the right ring finger.

## 2020-04-10 SURGERY — IRRIGATION AND DEBRIDEMENT EXTREMITY
Anesthesia: General | Site: Ring Finger | Laterality: Right

## 2020-04-10 MED ORDER — VANCOMYCIN HCL 1250 MG/250ML IV SOLN
1250.0000 mg | Freq: Once | INTRAVENOUS | Status: AC
  Administered 2020-04-11: 1250 mg via INTRAVENOUS
  Filled 2020-04-10: qty 250

## 2020-04-10 MED ORDER — VANCOMYCIN HCL 1250 MG/250ML IV SOLN: 1250.0000 mg | INTRAVENOUS | Status: DC

## 2020-04-10 MED ORDER — LACTATED RINGERS IV SOLN: INTRAVENOUS | Status: DC

## 2020-04-10 MED ORDER — FENTANYL CITRATE (PF) 100 MCG/2ML IJ SOLN: 25.0000 ug | INTRAMUSCULAR | Status: DC | PRN

## 2020-04-10 MED ORDER — HYDROMORPHONE HCL 1 MG/ML IJ SOLN
0.5000 mg | INTRAMUSCULAR | Status: DC | PRN
  Administered 2020-04-10: 0.5 mg via INTRAVENOUS
  Administered 2020-04-10 – 2020-04-13 (×5): 1 mg via INTRAVENOUS
  Filled 2020-04-10 (×8): qty 1

## 2020-04-10 MED ORDER — LIDOCAINE 2% (20 MG/ML) 5 ML SYRINGE
INTRAMUSCULAR | Status: DC | PRN
  Administered 2020-04-10: 40 mg via INTRAVENOUS

## 2020-04-10 MED ORDER — FENTANYL CITRATE (PF) 100 MCG/2ML IJ SOLN
INTRAMUSCULAR | Status: DC | PRN
  Administered 2020-04-10: 50 ug via INTRAVENOUS

## 2020-04-10 MED ORDER — ROCURONIUM BROMIDE 10 MG/ML (PF) SYRINGE
PREFILLED_SYRINGE | INTRAVENOUS | Status: DC | PRN
  Administered 2020-04-10: 60 mg via INTRAVENOUS

## 2020-04-10 MED ORDER — CHLORHEXIDINE GLUCONATE 0.12 % MT SOLN: 15.0000 mL | Freq: Once | OROMUCOSAL | Status: AC

## 2020-04-10 MED ORDER — PROPOFOL 10 MG/ML IV BOLUS
INTRAVENOUS | Status: AC
  Filled 2020-04-10: qty 20

## 2020-04-10 MED ORDER — PHENYLEPHRINE HCL-NACL 10-0.9 MG/250ML-% IV SOLN
INTRAVENOUS | Status: DC | PRN
  Administered 2020-04-10: 20 ug/min via INTRAVENOUS

## 2020-04-10 MED ORDER — SODIUM CHLORIDE 0.9 % IV SOLN
2.0000 g | Freq: Two times a day (BID) | INTRAVENOUS | Status: DC
  Administered 2020-04-10 – 2020-04-12 (×4): 2 g via INTRAVENOUS
  Filled 2020-04-10 (×5): qty 2

## 2020-04-10 MED ORDER — PROPOFOL 10 MG/ML IV BOLUS
INTRAVENOUS | Status: DC | PRN
  Administered 2020-04-10: 100 mg via INTRAVENOUS

## 2020-04-10 MED ORDER — ONDANSETRON HCL 4 MG/2ML IJ SOLN: 4.0000 mg | Freq: Four times a day (QID) | INTRAMUSCULAR | Status: DC | PRN

## 2020-04-10 MED ORDER — ONDANSETRON HCL 4 MG PO TABS: 4.0000 mg | ORAL_TABLET | Freq: Four times a day (QID) | ORAL | Status: DC | PRN

## 2020-04-10 MED ORDER — 0.9 % SODIUM CHLORIDE (POUR BTL) OPTIME
TOPICAL | Status: DC | PRN
  Administered 2020-04-10: 1000 mL

## 2020-04-10 MED ORDER — CHLORHEXIDINE GLUCONATE 0.12 % MT SOLN
OROMUCOSAL | Status: AC
  Administered 2020-04-10: 15 mL via OROMUCOSAL
  Filled 2020-04-10: qty 15

## 2020-04-10 MED ORDER — OXYCODONE HCL 5 MG PO TABS
5.0000 mg | ORAL_TABLET | Freq: Once | ORAL | Status: DC | PRN
Start: 2020-04-10 — End: 2020-04-10

## 2020-04-10 MED ORDER — FENTANYL CITRATE (PF) 250 MCG/5ML IJ SOLN
INTRAMUSCULAR | Status: AC
  Filled 2020-04-10: qty 5

## 2020-04-10 MED ORDER — VANCOMYCIN HCL 2000 MG/400ML IV SOLN
2000.0000 mg | INTRAVENOUS | Status: AC
  Administered 2020-04-10: 2000 mg via INTRAVENOUS
  Filled 2020-04-10: qty 400

## 2020-04-10 MED ORDER — MIDAZOLAM HCL 2 MG/2ML IJ SOLN: 0.5000 mg | Freq: Once | INTRAMUSCULAR | Status: DC | PRN

## 2020-04-10 MED ORDER — ROCURONIUM BROMIDE 10 MG/ML (PF) SYRINGE
PREFILLED_SYRINGE | INTRAVENOUS | Status: AC
  Filled 2020-04-10: qty 10

## 2020-04-10 MED ORDER — ONDANSETRON HCL 4 MG/2ML IJ SOLN
INTRAMUSCULAR | Status: AC
  Filled 2020-04-10: qty 6

## 2020-04-10 MED ORDER — ACETAMINOPHEN 325 MG PO TABS
650.0000 mg | ORAL_TABLET | Freq: Four times a day (QID) | ORAL | Status: DC | PRN
  Administered 2020-04-10 – 2020-04-11 (×3): 650 mg via ORAL
  Filled 2020-04-10 (×3): qty 2

## 2020-04-10 MED ORDER — EPHEDRINE 5 MG/ML INJ
INTRAVENOUS | Status: AC
  Filled 2020-04-10: qty 10

## 2020-04-10 MED ORDER — SODIUM CHLORIDE 0.9 % IV BOLUS
1000.0000 mL | Freq: Once | INTRAVENOUS | Status: AC
  Administered 2020-04-10: 1000 mL via INTRAVENOUS

## 2020-04-10 MED ORDER — INSULIN ASPART 100 UNIT/ML ~~LOC~~ SOLN
0.0000 [IU] | SUBCUTANEOUS | Status: DC
  Administered 2020-04-10 (×2): 2 [IU] via SUBCUTANEOUS
  Administered 2020-04-11 (×2): 1 [IU] via SUBCUTANEOUS
  Administered 2020-04-11: 2 [IU] via SUBCUTANEOUS
  Administered 2020-04-11: 1 [IU] via SUBCUTANEOUS
  Administered 2020-04-12: 2 [IU] via SUBCUTANEOUS
  Administered 2020-04-12: 1 [IU] via SUBCUTANEOUS
  Administered 2020-04-12 (×2): 2 [IU] via SUBCUTANEOUS
  Administered 2020-04-12: 1 [IU] via SUBCUTANEOUS
  Administered 2020-04-13 (×2): 2 [IU] via SUBCUTANEOUS
  Administered 2020-04-13 (×2): 1 [IU] via SUBCUTANEOUS

## 2020-04-10 MED ORDER — ONDANSETRON HCL 4 MG/2ML IJ SOLN
INTRAMUSCULAR | Status: DC | PRN
  Administered 2020-04-10: 4 mg via INTRAVENOUS

## 2020-04-10 MED ORDER — MEPERIDINE HCL 25 MG/ML IJ SOLN: 6.2500 mg | INTRAMUSCULAR | Status: DC | PRN

## 2020-04-10 MED ORDER — PHENYLEPHRINE 40 MCG/ML (10ML) SYRINGE FOR IV PUSH (FOR BLOOD PRESSURE SUPPORT)
PREFILLED_SYRINGE | INTRAVENOUS | Status: AC
  Filled 2020-04-10: qty 10

## 2020-04-10 MED ORDER — SODIUM CHLORIDE 0.9 % IV SOLN
2.0000 g | INTRAVENOUS | Status: AC
  Administered 2020-04-10: 2 g via INTRAVENOUS
  Filled 2020-04-10: qty 2

## 2020-04-10 MED ORDER — CHLORHEXIDINE GLUCONATE 4 % EX LIQD
60.0000 mL | Freq: Once | CUTANEOUS | Status: AC
  Administered 2020-04-10: 4 via TOPICAL

## 2020-04-10 MED ORDER — ACETAMINOPHEN 650 MG RE SUPP: 650.0000 mg | Freq: Four times a day (QID) | RECTAL | Status: DC | PRN

## 2020-04-10 MED ORDER — OXYCODONE HCL 5 MG/5ML PO SOLN: 5.0000 mg | Freq: Once | ORAL | Status: DC | PRN

## 2020-04-10 MED ORDER — METRONIDAZOLE IN NACL 5-0.79 MG/ML-% IV SOLN
500.0000 mg | Freq: Three times a day (TID) | INTRAVENOUS | Status: DC
  Administered 2020-04-10 – 2020-04-13 (×10): 500 mg via INTRAVENOUS
  Filled 2020-04-10 (×10): qty 100

## 2020-04-10 MED ORDER — SODIUM CHLORIDE 0.9 % IR SOLN
Status: DC | PRN
  Administered 2020-04-10: 3000 mL

## 2020-04-10 MED ORDER — VANCOMYCIN VARIABLE DOSE PER UNSTABLE RENAL FUNCTION (PHARMACIST DOSING): Status: DC

## 2020-04-10 MED ORDER — BUPIVACAINE HCL (PF) 0.25 % IJ SOLN
INTRAMUSCULAR | Status: AC
  Filled 2020-04-10: qty 30

## 2020-04-10 MED ORDER — POVIDONE-IODINE 10 % EX SWAB: 2.0000 "application " | Freq: Once | CUTANEOUS | Status: DC

## 2020-04-10 MED ORDER — LIDOCAINE 2% (20 MG/ML) 5 ML SYRINGE
INTRAMUSCULAR | Status: AC
  Filled 2020-04-10: qty 5

## 2020-04-10 MED ORDER — FENTANYL CITRATE (PF) 100 MCG/2ML IJ SOLN
100.0000 ug | Freq: Once | INTRAMUSCULAR | Status: AC
Start: 2020-04-10 — End: 2020-04-10
  Administered 2020-04-10: 100 ug via INTRAVENOUS
  Filled 2020-04-10: qty 2

## 2020-04-10 MED ORDER — SODIUM CHLORIDE 0.9 % IV SOLN: 2.0000 g | Freq: Three times a day (TID) | INTRAVENOUS | Status: DC

## 2020-04-10 MED ORDER — PROMETHAZINE HCL 25 MG/ML IJ SOLN: 6.2500 mg | INTRAMUSCULAR | Status: DC | PRN

## 2020-04-10 MED ORDER — SUGAMMADEX SODIUM 200 MG/2ML IV SOLN
INTRAVENOUS | Status: DC | PRN
  Administered 2020-04-10 (×2): 200 mg via INTRAVENOUS

## 2020-04-10 SURGICAL SUPPLY — 60 items
BLADE OSCILLATING/SAGITTAL (BLADE) ×2
BLADE SW THK.38XMED NAR THN (BLADE) IMPLANT
BNDG CMPR 9X4 STRL LF SNTH (GAUZE/BANDAGES/DRESSINGS) ×1
BNDG COHESIVE 1X5 TAN STRL LF (GAUZE/BANDAGES/DRESSINGS) IMPLANT
BNDG CONFORM 2 STRL LF (GAUZE/BANDAGES/DRESSINGS) IMPLANT
BNDG ELASTIC 3X5.8 VLCR STR LF (GAUZE/BANDAGES/DRESSINGS) ×2 IMPLANT
BNDG ELASTIC 4X5.8 VLCR STR LF (GAUZE/BANDAGES/DRESSINGS) ×2 IMPLANT
BNDG ESMARK 4X9 LF (GAUZE/BANDAGES/DRESSINGS) ×2 IMPLANT
BNDG GAUZE ELAST 4 BULKY (GAUZE/BANDAGES/DRESSINGS) ×2 IMPLANT
CORD BIPOLAR FORCEPS 12FT (ELECTRODE) ×2 IMPLANT
COVER SURGICAL LIGHT HANDLE (MISCELLANEOUS) ×2 IMPLANT
COVER WAND RF STERILE (DRAPES) ×2 IMPLANT
CUFF TOURN SGL QUICK 18X4 (TOURNIQUET CUFF) ×2 IMPLANT
CUFF TOURN SGL QUICK 24 (TOURNIQUET CUFF)
CUFF TRNQT CYL 24X4X16.5-23 (TOURNIQUET CUFF) IMPLANT
DRAIN PENROSE 1/4X12 LTX STRL (WOUND CARE) IMPLANT
DRAPE SURG 17X23 STRL (DRAPES) ×2 IMPLANT
DRSG ADAPTIC 3X8 NADH LF (GAUZE/BANDAGES/DRESSINGS) ×2 IMPLANT
ELECT REM PT RETURN 9FT ADLT (ELECTROSURGICAL)
ELECTRODE REM PT RTRN 9FT ADLT (ELECTROSURGICAL) IMPLANT
GAUZE SPONGE 4X4 12PLY STRL (GAUZE/BANDAGES/DRESSINGS) ×1 IMPLANT
GAUZE SPONGE 4X4 12PLY STRL LF (GAUZE/BANDAGES/DRESSINGS) ×1 IMPLANT
GAUZE XEROFORM 1X8 LF (GAUZE/BANDAGES/DRESSINGS) ×1 IMPLANT
GAUZE XEROFORM 5X9 LF (GAUZE/BANDAGES/DRESSINGS) IMPLANT
GLOVE BIOGEL PI IND STRL 8.5 (GLOVE) ×1 IMPLANT
GLOVE BIOGEL PI INDICATOR 8.5 (GLOVE) ×1
GLOVE SURG ORTHO 8.0 STRL STRW (GLOVE) ×2 IMPLANT
GOWN STRL REUS W/ TWL LRG LVL3 (GOWN DISPOSABLE) ×3 IMPLANT
GOWN STRL REUS W/ TWL XL LVL3 (GOWN DISPOSABLE) ×1 IMPLANT
GOWN STRL REUS W/TWL LRG LVL3 (GOWN DISPOSABLE)
GOWN STRL REUS W/TWL XL LVL3 (GOWN DISPOSABLE) ×4
HANDPIECE INTERPULSE COAX TIP (DISPOSABLE)
KIT BASIN OR (CUSTOM PROCEDURE TRAY) ×2 IMPLANT
KIT TURNOVER KIT B (KITS) ×2 IMPLANT
MANIFOLD NEPTUNE II (INSTRUMENTS) ×2 IMPLANT
NDL HYPO 25GX1X1/2 BEV (NEEDLE) IMPLANT
NEEDLE HYPO 25GX1X1/2 BEV (NEEDLE) IMPLANT
NS IRRIG 1000ML POUR BTL (IV SOLUTION) ×2 IMPLANT
PACK ORTHO EXTREMITY (CUSTOM PROCEDURE TRAY) ×2 IMPLANT
PAD ARMBOARD 7.5X6 YLW CONV (MISCELLANEOUS) ×4 IMPLANT
PAD CAST 4YDX4 CTTN HI CHSV (CAST SUPPLIES) ×1 IMPLANT
PADDING CAST COTTON 4X4 STRL (CAST SUPPLIES)
SET CYSTO W/LG BORE CLAMP LF (SET/KITS/TRAYS/PACK) IMPLANT
SET HNDPC FAN SPRY TIP SCT (DISPOSABLE) IMPLANT
SOAP 2 % CHG 4 OZ (WOUND CARE) ×2 IMPLANT
SPONGE LAP 18X18 RF (DISPOSABLE) ×2 IMPLANT
SPONGE LAP 4X18 RFD (DISPOSABLE) ×2 IMPLANT
SUT ETHILON 4 0 PS 2 18 (SUTURE) IMPLANT
SUT ETHILON 5 0 P 3 18 (SUTURE)
SUT NYLON ETHILON 5-0 P-3 1X18 (SUTURE) IMPLANT
SUT PROLENE 3 0 PS 1 (SUTURE) ×2 IMPLANT
SWAB COLLECTION DEVICE MRSA (MISCELLANEOUS) ×2 IMPLANT
SWAB CULTURE ESWAB REG 1ML (MISCELLANEOUS) IMPLANT
SYR CONTROL 10ML LL (SYRINGE) IMPLANT
TOWEL GREEN STERILE (TOWEL DISPOSABLE) ×2 IMPLANT
TOWEL GREEN STERILE FF (TOWEL DISPOSABLE) ×2 IMPLANT
TUBE CONNECTING 12X1/4 (SUCTIONS) ×2 IMPLANT
UNDERPAD 30X36 HEAVY ABSORB (UNDERPADS AND DIAPERS) ×2 IMPLANT
WATER STERILE IRR 1000ML POUR (IV SOLUTION) ×2 IMPLANT
YANKAUER SUCT BULB TIP NO VENT (SUCTIONS) ×2 IMPLANT

## 2020-04-10 NOTE — Progress Notes (Addendum)
PROGRESS NOTE    [name]  QIH:[id number] DOB: 1940-06-09 DOA: 04/09/2020 PCP: Lavone Orn, MD    Chief Complaint  Patient presents with  . 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  With local neurectomies and primary closure -Continue broad-spectrum antibiotic Vanco/cefepime/Flagyl, awaiting surgical wound culture -  Hold lasix, decrease  LR to 75cc/hr  Nutritional Assessment:  The patient's BMI is: Body mass index is 36.18 kg/m.[name] Kitchen  Seen by dietician.  I agree with the assessment and plan as outlined below:  Nutrition Status:        .     Skin Assessment:  I have examined the patient's skin and I agree with the wound assessment as performed by the wound care RN as outlined below:     Unresulted Labs (From admission, onward)          Start     Ordered   04/11/20 8756  Basic metabolic panel  Tomorrow morning,   R        04/10/20 1627   04/11/20 0500  Magnesium  Tomorrow morning,   R        04/10/20 1627   04/11/20 0500  Lactic acid, plasma  Tomorrow morning,   R        04/10/20 1627   04/10/20 1627  CBC with Differential/Platelet  Once,   R        04/10/20 1627   04/10/20 1307  Aerobic/Anaerobic Culture w Gram Stain (surgical/deep wound)  RELEASE UPON ORDERING,   TIMED       Comments: Specimen A: Phone [phone] Immunocompromised?  No  Antibiotic Treatment:  bactrim Is the patient on airborne/droplet precautions?  No Clinical History:  Right ring finger infection Special Instructions:  none Specimen Disposition:  Microbiology     04/10/20 1307   04/09/20 1955  Urinalysis, Routine w reflex microscopic  ONCE - STAT,   STAT        04/09/20 1954            DVT prophylaxis: SCD's Start: 04/10/20 1351 SCDs Start: 04/10/20 0242   Code Status: Family Communication: wife and son at bedside Disposition:   Status is: Inpatient   Dispo: The patient is from: home              Anticipated d/c is to: home              Anticipated d/c date is: >3days                Consultants:   ortho  Procedures:     Antimicrobials:        Objective: Vitals:   04/10/20 1250 04/10/20 1305 04/10/20 1320 04/10/20 1350  BP: 136/65 (!) 123/46 (!) 121/49 127/72  Pulse: 83 78 80 79  Resp: 20 20 15 16   Temp: 97.7 F (36.5 C)  98 F (36.7 C) 98.3 F (36.8 C)  TempSrc:      SpO2: 94%  95% 97% 96%  Weight:      Height:        Intake/Output Summary (Last 24 hours) at 04/10/2020 1627 Last data filed at 04/10/2020 1516 Gross per 24 hour  Intake 500 ml  Output 450 ml  Net 50 ml   Filed Weights   04/10/20 0100 04/10/20 0414  Weight: 114 kg 111.1 kg    Examination:  General exam: calm, NAD Respiratory system: Clear to auscultation. Respiratory effort normal. Cardiovascular system: S1 & S2 heard, RRR.  Precordial murmur, report chronic, no pedal edema. Gastrointestinal system: Abdomen is nondistended, soft and nontender. . Normal bowel sounds heard. Central nervous system: Alert and oriented. No focal neurological deficits. Extremities: Right hand postop changes, dressing intact Skin: Right hand postop changes, scattered abrasions in arms and legs Psychiatry: Judgement and insight appear normal. Mood & affect appropriate.     Data Reviewed: I have personally reviewed following labs and imaging studies  CBC: Recent Labs  Lab 04/09/20 2016 04/10/20 0534  WBC 13.4* 11.7*  NEUTROABS 10.1*   --   HGB 13.7 11.4*  HCT 42.7 35.3*  MCV 94.1 91.7  PLT 385 993    Basic Metabolic Panel: Recent Labs  Lab 04/09/20 2016 04/10/20 0534  NA 133* 134*  K 4.5 4.7  CL 101 101  CO2 23 21*  GLUCOSE 110* 116*  BUN 13 17  CREATININE 1.21 1.51*  CALCIUM 9.8 9.1    GFR: Estimated Creatinine Clearance: 48 mL/min (A) (by C-G formula based on SCr of 1.51 mg/dL (H)).  Liver Function Tests: Recent Labs  Lab 04/09/20 2016  AST 31  ALT 39  ALKPHOS 87  BILITOT 0.7  PROT 7.3  ALBUMIN 2.9*    CBG: Recent Labs  Lab 04/10/20 0505 04/10/20 0900 04/10/20 1028 04/10/20 1250 04/10/20 1610  GLUCAP 115* 97 101* 97 162*     Recent Results (from the past 240 hour(s))  Culture, blood (routine x 2)     Status: None (Preliminary result)   Collection Time: 04/09/20  8:00 PM   Specimen: BLOOD LEFT HAND  Result Value Ref Range Status   Specimen Description BLOOD LEFT HAND  Final   Special Requests AEROBIC BOTTLE ONLY Blood Culture adequate volume  Final   Culture   Final    NO GROWTH < 12 HOURS Performed at [facility], [place] [address] [place], [place] [address]    Report Status PENDING  Incomplete  Culture, blood (routine x 2)     Status: None (Preliminary result)   Collection Time: 04/09/20  8:13 PM   Specimen: BLOOD LEFT ARM  Result Value Ref Range Status   Specimen Description BLOOD LEFT ARM  Final   Special Requests   Final    BOTTLES DRAWN AEROBIC AND ANAEROBIC Blood Culture adequate volume   Culture   Final    NO GROWTH < 12 HOURS Performed at [facility], [place] [address], [place], [place] [address]    Report Status PENDING  Incomplete  Resp Panel by RT-PCR (Flu A&B, Covid) Nasopharyngeal Swab     Status: None   Collection Time: 04/10/20  2:00 AM   Specimen: Nasopharyngeal Swab; Nasopharyngeal(NP) swabs in vial transport medium  Result Value Ref Range Status   SARS Coronavirus 2 by RT PCR NEGATIVE NEGATIVE Final    Comment: (NOTE) SARS-CoV-2 target  nucleic acids are NOT DETECTED.  The SARS-CoV-2 RNA is generally detectable in upper respiratory specimens during the acute phase of infection. The  lowest concentration of SARS-CoV-2 viral copies this assay can detect is 138 copies/mL. A negative result does not preclude SARS-Cov-2 infection and should not be used as the sole basis for treatment or other patient management decisions. A negative result may occur with  improper specimen collection/handling, submission of specimen other than nasopharyngeal swab, presence of viral mutation(s) within the areas targeted by this assay, and inadequate number of viral copies(<138 copies/mL). A negative result must be combined with clinical observations, patient history, and epidemiological information. The expected result is Negative.  Fact Sheet for Patients:  EntrepreneurPulse.com.au  Fact Sheet for Healthcare Providers:  IncredibleEmployment.be  This test is no t yet approved or cleared by the [place] FDA and  has been authorized for detection and/or diagnosis of SARS-CoV-2 by FDA under an Emergency Use Authorization (EUA). This EUA will remain  in effect (meaning this test can be used) for the duration of the COVID-19 declaration under Section 564(b)(1) of the Act, 21 U.S.C.section 360bbb-3(b)(1), unless the authorization is terminated  or revoked sooner.       Influenza A by PCR NEGATIVE NEGATIVE Final   Influenza B by PCR NEGATIVE NEGATIVE Final    Comment: (NOTE) The Xpert Xpress SARS-CoV-2/FLU/RSV plus assay is intended as an aid in the diagnosis of influenza from Nasopharyngeal swab specimens and should not be used as a sole basis for treatment. Nasal washings and aspirates are unacceptable for Xpert Xpress SARS-CoV-2/FLU/RSV testing.  Fact Sheet for Patients: EntrepreneurPulse.com.au  Fact Sheet for Healthcare  Providers: IncredibleEmployment.be  This test is not yet approved or cleared by the [place] FDA and has been authorized for detection and/or diagnosis of SARS-CoV-2 by FDA under an Emergency Use Authorization (EUA). This EUA will remain in effect (meaning this test can be used) for the duration of the COVID-19 declaration under Section 564(b)(1) of the Act, 21 U.S.C. section 360bbb-3(b)(1), unless the authorization is terminated or revoked.  Performed at [facility], [place] [address], [place], [place] [address]   MRSA PCR Screening     Status: None   Collection Time: 04/10/20 10:05 AM   Specimen: Nasopharyngeal  Result Value Ref Range Status   MRSA by PCR NEGATIVE NEGATIVE Final    Comment:        The GeneXpert MRSA Assay (FDA approved for NASAL specimens only), is one component of a comprehensive MRSA colonization surveillance program. It is not intended to diagnose MRSA infection nor to guide or monitor treatment for MRSA infections. Performed at [facility], [place] [address], [place], [place] [address]          Radiology Studies: DG Hand Complete Right  Result Date: 04/10/2020 CLINICAL DATA:  Right hand wound EXAM: RIGHT HAND - COMPLETE 3+ VIEW COMPARISON:  None. FINDINGS: Soft tissue swelling in the right ring finger. Bone destruction in the distal phalanx concerning for osteomyelitis. Moderate to advanced degenerative changes in the IP joints and 1st carpometacarpal joint. IMPRESSION: Lucency in the distal phalanx of the right ring finger with bone destruction concerning for osteomyelitis. Diffuse soft tissue swelling throughout the right ring finger. Electronically Signed   By: Rolm Baptise M.D.   On: 04/10/2020 02:06   DG MINI C-ARM IMAGE ONLY  Result Date: 04/10/2020 There is no interpretation for this exam.  This order is for images obtained during a surgical procedure.  Please See "Surgeries" Tab for more information regarding the  procedure.        Scheduled Meds: . insulin aspart  0-9 Units Subcutaneous Q4H  . vancomycin variable dose per unstable renal function (pharmacist dosing)   Does not apply See admin instructions   Continuous Infusions: . ceFEPime (MAXIPIME) IV    . lactated ringers 125 mL/hr at 04/10/20 0509  . metronidazole 500 mg (04/10/20 1527)  . [START ON 04/11/2020] vancomycin       LOS: 0 days   Time spent: mins Greater than 50% of this time was spent in counseling, explanation of diagnosis, planning of further management, and coordination of care.   Voice Recognition Vivian[employer] Spare dictation system was used to create this note, attempts have been made to correct errors. Please contact the author with questions and/or clarifications.   Florencia Reasons, MD PhD FACP [facility]  Available via Epic secure chat 7am-7pm for nonurgent issues Please page for urgent issues To page the attending provider between 7A-7P or the covering provider during after hours 7P-7A, please log into the web site [email or web address] and access using universal [place] password for that web site. If you do not have the password, please call the hospital operator.    04/10/2020, 4:27 PM

## 2020-04-10 NOTE — Consult Note (Signed)
Reason for Consult: Right ring finger infection Referring Physician: Dr. Harlene Ramus internal medicine  [name] is an 80 y.o. male.  HPI: [name] is a 80 y.o. male with medical history significant of DM2, peripheral neuropathy, OSA, obesity, PAF on eliquis.  Pt injured R ring finger about 3 weeks ago.  Suspects that he sustained a burn because he noticed blistering on his finger, but is unsure as to the mechanism of injury due to severe peripheral neuropathy (wasn't initially painful).  Since that time he has noticed significantly worsening redness, swelling to finger that has progressed to hand.  Purulent drainage from finger for past several days.  Finally saw PCP who started him on bactrim.  Despite taking as prescribed no improvement.  Patient was admitted for the infection and hand surgery consulted for the significant infection involving the ring finger extending into the palm.  Past Medical History:  Diagnosis Date  . Adenomatous colon polyp   . Allergic rhinitis   . BMI 40.0-44.9, adult ([place])   . Chronic insomnia   . Chronic low back pain   . Constipation, unspecified   . Diabetes mellitus ([place]) 2015  . DJD (degenerative joint disease) of knee   . Hypercalcemia   . Hypertension   . Hypertriglyceridemia   . 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 Lumbar facet arthropathy   . Morbid obesity ([place])   . Neuropathy   . Nonallergic rhinitis   . OSA (obstructive sleep apnea)   . Other chronic pain   . Peripheral axonal neuropathy   . Upper GI bleed    due to aspirin  . Venous insufficiency   . Venous stasis dermatitis of both lower extremities     Past Surgical History:  Procedure Laterality Date  . cataract     Bilateral  . GLAUCOMA SURGERY    . HAND SURGERY Left    s/p amputation of left finger    Family History  Problem Relation Age of Onset  . Gout Father   . Osteoarthritis Mother        Deceased, 65  . Healthy Brother        x2  . Healthy Son         x2    Social History:  reports that he has quit smoking. He has never used smokeless tobacco. He reports that he does not drink alcohol and does not use drugs.  Allergies:  Allergies  Allergen Reactions  . Ambien [Zolpidem Tartrate]     "lost his mind"  . Amlodipine Besylate Other (See Comments)    Other reaction(s): edema edema   . Aspirin     Bleeding ulcer  . Dorzolamide Hcl-Timolol Mal     Other reaction(s): Other (See Comments), red and burning    Medications: I have reviewed the patient's current medications.  Results for orders placed or performed during the hospital encounter of 04/09/20 (from the past 48 hour(s))  Culture, blood (routine x 2)     Status: None (Preliminary result)   Collection Time: 04/09/20  8:00 PM   Specimen: BLOOD LEFT HAND  Result Value Ref Range   Specimen Description BLOOD LEFT HAND    Special Requests AEROBIC BOTTLE ONLY Blood Culture adequate volume    Culture      NO GROWTH < 12 HOURS Performed at [facility] [address] [place], [place] [address]    Report Status PENDING   Culture, blood (routine x 2)  Status: None (Preliminary result)   Collection Time: 04/09/20  8:13 PM   Specimen: BLOOD LEFT ARM  Result Value Ref Range   Specimen Description BLOOD LEFT ARM    Special Requests      BOTTLES DRAWN AEROBIC AND ANAEROBIC Blood Culture adequate volume   Culture      NO GROWTH < 12 HOURS Performed at [facility], [place] [address] [place], [place] [address]    Report Status PENDING   Lactic acid, plasma     Status: Abnormal   Collection Time: 04/09/20  8:16 PM  Result Value Ref Range   Lactic Acid, Venous 2.5 (HH) 0.5 - 1.9 mmol/L    Comment: CRITICAL RESULT CALLED TO, READ BACK BY AND VERIFIED WITH: K.STEREN RN 2111 04/09/20 MCCORMICK K Performed at [facility] [address], [place], [place] [address]   Comprehensive metabolic panel     Status: Abnormal   Collection Time: 04/09/20  8:16 PM   Result Value Ref Range   Sodium 133 (L) 135 - 145 mmol/L   Potassium 4.5 3.5 - 5.1 mmol/L   Chloride 101 98 - 111 mmol/L   CO2 23 22 - 32 mmol/L   Glucose, Bld 110 (H) 70 - 99 mg/dL    Comment: Glucose reference range applies only to samples taken after fasting for at least 8 hours.   BUN 13 8 - 23 mg/dL   Creatinine, Ser 1.21 0.61 - 1.24 mg/dL   Calcium 9.8 8.9 - 10.3 mg/dL   Total Protein 7.3 6.5 - 8.1 g/dL   Albumin 2.9 (L) 3.5 - 5.0 g/dL   AST 31 15 - 41 U/L   ALT 39 0 - 44 U/L   Alkaline Phosphatase 87 38 - 126 U/L   Total Bilirubin 0.7 0.3 - 1.2 mg/dL   GFR, Estimated >60 >60 mL/min    Comment: (NOTE) Calculated using the CKD-EPI Creatinine Equation (2021)    Anion gap 9 5 - 15    Comment: Performed at [facility] [address], [place], [place] [address]  CBC with Differential     Status: Abnormal   Collection Time: 04/09/20  8:16 PM  Result Value Ref Range   WBC 13.4 (H) 4.0 - 10.5 K/uL   RBC 4.54 4.22 - 5.81 MIL/uL   Hemoglobin 13.7 13.0 - 17.0 g/dL   HCT 42.7 39.0 - 52.0 %   MCV 94.1 80.0 - 100.0 fL   MCH 30.2 26.0 - 34.0 pg   MCHC 32.1 30.0 - 36.0 g/dL   RDW 14.4 11.5 - 15.5 %   Platelets 385 150 - 400 K/uL   nRBC 0.0 0.0 - 0.2 %   Neutrophils Relative % 75 %   Neutro Abs 10.1 (H) 1.7 - 7.7 K/uL   Lymphocytes Relative 13 %   Lymphs Abs 1.8 0.7 - 4.0 K/uL   Monocytes Relative 9 %   Monocytes Absolute 1.1 (H) 0.1 - 1.0 K/uL   Eosinophils Relative 2 %   Eosinophils Absolute 0.3 0.0 - 0.5 K/uL   Basophils Relative 0 %   Basophils Absolute 0.0 0.0 - 0.1 K/uL   Immature Granulocytes 1 %   Abs Immature Granulocytes 0.08 (H) 0.00 - 0.07 K/uL    Comment: Performed at [facility] [address] [place], [place] [address]  Lactic acid, plasma     Status: Abnormal   Collection Time: 04/10/20  1:57 AM  Result Value Ref Range   Lactic  Acid, Venous 2.4 (HH) 0.5 - 1.9 mmol/L    Comment: CRITICAL VALUE NOTED.  VALUE IS CONSISTENT WITH PREVIOUSLY  REPORTED AND CALLED VALUE. Performed at [facility], [place] [address] [place], [place] [address]   Resp Panel by RT-PCR (Flu A&B, Covid) Nasopharyngeal Swab     Status: None   Collection Time: 04/10/20  2:00 AM   Specimen: Nasopharyngeal Swab; Nasopharyngeal(NP) swabs in vial transport medium  Result Value Ref Range   SARS Coronavirus 2 by RT PCR NEGATIVE NEGATIVE    Comment: (NOTE) SARS-CoV-2 target nucleic acids are NOT DETECTED.  The SARS-CoV-2 RNA is generally detectable in upper respiratory specimens during the acute phase of infection. The lowest concentration of SARS-CoV-2 viral copies this assay can detect is 138 copies/mL. A negative result does not preclude SARS-Cov-2 infection and should not be used as the sole basis for treatment or other patient management decisions. A negative result may occur with  improper specimen collection/handling, submission of specimen other than nasopharyngeal swab, presence of viral mutation(s) within the areas targeted by this assay, and inadequate number of viral copies(<138 copies/mL). A negative result must be combined with clinical observations, patient history, and epidemiological information. The expected result is Negative.  Fact Sheet for Patients:  EntrepreneurPulse.com.au  Fact Sheet for Healthcare Providers:  IncredibleEmployment.be  This test is no t yet approved or cleared by the [place] FDA and  has been authorized for detection and/or diagnosis of SARS-CoV-2 by FDA under an Emergency Use Authorization (EUA). This EUA will remain  in effect (meaning this test can be used) for the duration of the COVID-19 declaration under Section 564(b)(1) of the Act, 21 U.S.C.section 360bbb-3(b)(1), unless the authorization is terminated  or revoked sooner.       Influenza A by PCR NEGATIVE NEGATIVE   Influenza B by PCR NEGATIVE NEGATIVE    Comment: (NOTE) The Xpert Xpress  SARS-CoV-2/FLU/RSV plus assay is intended as an aid in the diagnosis of influenza from Nasopharyngeal swab specimens and should not be used as a sole basis for treatment. Nasal washings and aspirates are unacceptable for Xpert Xpress SARS-CoV-2/FLU/RSV testing.  Fact Sheet for Patients: EntrepreneurPulse.com.au  Fact Sheet for Healthcare Providers: IncredibleEmployment.be  This test is not yet approved or cleared by the [place] FDA and has been authorized for detection and/or diagnosis of SARS-CoV-2 by FDA under an Emergency Use Authorization (EUA). This EUA will remain in effect (meaning this test can be used) for the duration of the COVID-19 declaration under Section 564(b)(1) of the Act, 21 U.S.C. section 360bbb-3(b)(1), unless the authorization is terminated or revoked.  Performed at [facility], [place] [address], [place], [place] [address]   Glucose, capillary     Status: Abnormal   Collection Time: 04/10/20  5:05 AM  Result Value Ref Range   Glucose-Capillary 115 (H) 70 - 99 mg/dL    Comment: Glucose reference range applies only to samples taken after fasting for at least 8 hours.  CBC     Status: Abnormal   Collection Time: 04/10/20  5:34 AM  Result Value Ref Range   WBC 11.7 (H) 4.0 - 10.5 K/uL   RBC 3.85 (L) 4.22 - 5.81 MIL/uL   Hemoglobin 11.4 (L) 13.0 - 17.0 g/dL   HCT 35.3 (L) 39.0 - 52.0 %   MCV 91.7 80.0 - 100.0 fL   MCH 29.6 26.0 - 34.0 pg   MCHC 32.3 30.0 - 36.0 g/dL   RDW 14.4 11.5 - 15.5 %  Platelets 292 150 - 400 K/uL   nRBC 0.0 0.0 - 0.2 %    Comment: Performed at [facility], [place] [address] [place], [place] [address]  Basic metabolic panel     Status: Abnormal   Collection Time: 04/10/20  5:34 AM  Result Value Ref Range   Sodium 134 (L) 135 - 145 mmol/L   Potassium 4.7 3.5 - 5.1 mmol/L   Chloride 101 98 - 111 mmol/L   CO2 21 (L) 22 - 32 mmol/L   Glucose, Bld 116 (H) 70 - 99 mg/dL    Comment:  Glucose reference range applies only to samples taken after fasting for at least 8 hours.   BUN 17 8 - 23 mg/dL   Creatinine, Ser 1.51 (H) 0.61 - 1.24 mg/dL   Calcium 9.1 8.9 - 10.3 mg/dL   GFR, Estimated 46 (L) >60 mL/min    Comment: (NOTE) Calculated using the CKD-EPI Creatinine Equation (2021)    Anion gap 12 5 - 15    Comment: Performed at [facility] [address] [place], [place] [address]  Hemoglobin A1c     Status: Abnormal   Collection Time: 04/10/20  5:34 AM  Result Value Ref Range   Hgb A1c MFr Bld 6.9 (H) 4.8 - 5.6 %    Comment: (NOTE) Pre diabetes:          5.7%-6.4%  Diabetes:              >6.4%  Glycemic control for   <7.0% adults with diabetes    Mean Plasma Glucose 151.33 mg/dL    Comment: Performed at [facility] [address], [place], [place] [address]  Protime-INR     Status: Abnormal   Collection Time: 04/10/20  5:34 AM  Result Value Ref Range   Prothrombin Time 15.4 (H) 11.4 - 15.2 seconds   INR 1.3 (H) 0.8 - 1.2    Comment: (NOTE) INR goal varies based on device and disease states. Performed at [facility], [place] [address] [place], [place] [address]   Glucose, capillary     Status: None   Collection Time: 04/10/20  9:00 AM  Result Value Ref Range   Glucose-Capillary 97 70 - 99 mg/dL    Comment: Glucose reference range applies only to samples taken after fasting for at least 8 hours.  Glucose, capillary     Status: Abnormal   Collection Time: 04/10/20 10:28 AM  Result Value Ref Range   Glucose-Capillary 101 (H) 70 - 99 mg/dL    Comment: Glucose reference range applies only to samples taken after fasting for at least 8 hours.    DG Hand Complete Right  Result Date: 04/10/2020 CLINICAL DATA:  Right hand wound EXAM: RIGHT HAND - COMPLETE 3+ VIEW COMPARISON:  None. FINDINGS: Soft tissue swelling in the right ring finger. Bone destruction in the distal phalanx concerning for osteomyelitis. Moderate to advanced degenerative  changes in the IP joints and 1st carpometacarpal joint. IMPRESSION: Lucency in the distal phalanx of the right ring finger with bone destruction concerning for osteomyelitis. Diffuse soft tissue swelling throughout the right ring finger. Electronically Signed   By: Rolm Baptise M.D.   On: 04/10/2020 02:06    ROS as noted in the medical history medical chart was reviewed. Blood pressure 139/77, pulse 77, temperature 99.7 F (37.6 C), temperature source Oral, resp. rate 16, height 5\' 9"  (1.753 m), weight 111.1 kg, SpO2 95 %. Physical Exam  The patient is alert and oriented to person place and time.  Patient does have the bandage on the right hand.  The finger pictures were examined and those documented in the chart.  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  The patient presented with a worsening infection to the right ring finger.  The patient's images were reviewed plain radiographs were reviewed.  The patient has a significant infection of the ring finger.  Going to keep the patient n.p.o. this morning.  The patient is on the operating room scheduled undergo debridement and possible amputation of the finger.  The patient has destruction of the distal phalanx so the patient may require either removal of the entire ray or potential amputation further distal in the finger.  Plan for surgery this morning for the significant finger infection.   Janelle Floor [facility] 04/10/2020, 11:25 AM

## 2020-04-10 NOTE — Progress Notes (Signed)
Pharmacy Antibiotic Note  [name] is a 80 y.o. male admitted on 04/09/2020 with L pointer finger infection.  Pharmacy has been consulted for Vancomycin and Cefepime dosing.  Plan: Cefepime 2gm IV q8h Vancomycin 2000mg  now then 1250 mg IV Q 24 hrs. Goal AUC 400-550. Expected AUC: 459 SCr used: 1.21 Will f/u renal function, micro data, and pt's clinical condition Vanc levels prn   Weight: 114 kg (251 lb 5.2 oz)  Temp (24hrs), Avg:99 F (37.2 C), Min:98.4 F (36.9 C), Max:99.9 F (37.7 C)  Recent Labs  Lab 04/09/20 2016  WBC 13.4*  CREATININE 1.21  LATICACIDVEN 2.5*    Estimated Creatinine Clearance: 60.6 mL/min (by C-G formula based on SCr of 1.21 mg/dL).    Allergies  Allergen Reactions  . Ambien [Zolpidem Tartrate]     "lost his mind"  . Amlodipine Besylate Other (See Comments)    Other reaction(s): edema edema   . Aspirin     Bleeding ulcer  . Dorzolamide Hcl-Timolol Mal     Other reaction(s): Other (See Comments), red and burning    Antimicrobials this admission: 4/2 Cefepime >>  4/2 Vanc >>   Microbiology results: 4/1 BCx:   Thank you for allowing pharmacy to be a part of this patient's care.  Sherlon Handing, PharmD, BCPS Please see amion for complete clinical pharmacist phone list 04/10/2020 2:47 AM

## 2020-04-10 NOTE — Anesthesia Postprocedure Evaluation (Signed)
Anesthesia Post Note  Patient: [name]  Procedure(s) Performed: IRRIGATION AND DEBRIDEMENT AND AMPUTATION OF RIGHT RING FINGER (Right Ring Finger)     Patient location during evaluation: PACU Anesthesia Type: General Level of consciousness: awake and alert, patient cooperative and oriented Pain management: pain level controlled Vital Signs Assessment: post-procedure vital signs reviewed and stable Respiratory status: spontaneous breathing, nonlabored ventilation, respiratory function stable and patient connected to nasal cannula oxygen Cardiovascular status: blood pressure returned to baseline and stable Postop Assessment: no apparent nausea or vomiting Anesthetic complications: no   No complications documented.  Last Vitals:  Vitals:   04/10/20 1320 04/10/20 1350  BP: (!) 121/49 127/72  Pulse: 80 79  Resp: 15 16  Temp: 36.7 C 36.8 C  SpO2: 97% 96%    Last Pain:  Vitals:   04/10/20 1350  TempSrc:   PainSc: 0-No pain                 Avaneesh Pepitone,E. [facility] [place]

## 2020-04-10 NOTE — Consult Note (Signed)
The patient's chart was reviewed.  The patient presented with a worsening infection to the right ring finger.  The patient's images were reviewed plain radiographs were reviewed.  The patient has a significant infection of the ring finger.  Going to keep the patient n.p.o. this morning.  The patient is on the operating room scheduled undergo debridement and possible amputation of the finger.  The patient has destruction of the distal phalanx so the patient may require either removal of the entire ray or potential amputation further distal in the finger.  Plan for surgery this morning for the significant finger infection.

## 2020-04-10 NOTE — ED Provider Notes (Signed)
[facility] EMERGENCY DEPARTMENT Provider Note   CSN: [id number] Arrival date & time: 04/09/20  1831     History Chief Complaint  Patient presents with  . Finger Injury    [name] is a 80 y.o. male with a history of diabetes mellitus type 2, peripheral neuropathy, CKD, hypertension, obesity, hypertriglyceridemia, venous insufficiency, and glaucoma who presents the emergency department with a chief complaint of right upper extremity wound.  The patient injured his right ring finger approximately 3 weeks ago.  He suspects that he sustained a burn because he noticed blistering on the finger, but is uncertain of the mechanism of injury due to severe peripheral neuropathy.  He has noticed significantly worsening redness, swelling to the right ring finger that has now progressed to the hand.  No known aggravating or alleviating factors.  There has been purulent drainage from the right ring finger for several days.  He has also noticed red streaks going up the right hand for the last 5 days.  Around that time, he saw his PCP due to his wife's insistence and was started on Bactrim, which she has been taking as prescribed.  No recent fever chills, chest pain, shortness of breath, vomiting, diarrhea, weakness.  He was reseen by his PCP yesterday and was advised to come to the emergency department for further evaluation.  Patient is a poor historian.   The history is provided by the patient and medical records. No language interpreter was used.       Past Medical History:  Diagnosis Date  . Adenomatous colon polyp   . Allergic rhinitis   . BMI 40.0-44.9, adult ([place])   . Chronic insomnia   . Chronic low back pain   . Constipation, unspecified   . Diabetes mellitus ([place]) 2015  . DJD (degenerative joint disease) of knee   . Hypercalcemia   . Hypertension   . Hypertriglyceridemia   . 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 Lumbar facet arthropathy   . Morbid obesity ([place])    . Neuropathy   . Nonallergic rhinitis   . OSA (obstructive sleep apnea)   . Other chronic pain   . Peripheral axonal neuropathy   . Upper GI bleed    due to aspirin  . Venous insufficiency   . Venous stasis dermatitis of both lower extremities     Patient Active Problem List   Diagnosis Date Noted  . Infection of right hand 04/10/2020  . Acute osteomyelitis of hand including fingers, right ([place]) 04/10/2020  . PAF (paroxysmal atrial fibrillation) ([place]) 04/10/2020  . AKI (acute kidney injury) ([place]) 01/06/2020  . Weakness 01/06/2020  . Dehydration 01/06/2020  . Orthostatic hypotension 01/06/2020  . Diabetes mellitus type 2 in obese ([place]) 01/06/2020  . Glaucoma 01/06/2020  . Tachycardia 07/10/2019  . DOE (dyspnea on exertion) 07/10/2019    Past Surgical History:  Procedure Laterality Date  . cataract     Bilateral  . GLAUCOMA SURGERY    . HAND SURGERY Left    s/p amputation of left finger       Family History  Problem Relation Age of Onset  . Gout Father   . Osteoarthritis Mother        Deceased, 25  . Healthy Brother        x2  . Healthy Son        x2    Social History   Tobacco Use  . Smoking status: Former Research scientist (life sciences)  . Smokeless tobacco: Never  Used  . Tobacco comment: Quit 20 years ago.    Substance Use Topics  . Alcohol use: No  . Drug use: No    Home Medications Prior to Admission medications   Medication Sig Start Date End Date Taking? Authorizing Provider  acetaminophen (TYLENOL) 500 MG tablet Take 1,000 mg by mouth every 6 (six) hours as needed for moderate pain.    [provider]  amLODipine (NORVASC) 5 MG tablet Take 5 mg by mouth daily. 11/12/19   [provider]  apixaban (ELIQUIS) 5 MG TABS tablet Take 1 tablet (5 mg total) by mouth 2 (two) times daily. 01/28/20 04/27/20  Minus Breeding, MD  COMBIGAN 0.2-0.5 % ophthalmic solution Place 1 drop into the right eye 2 (two) times daily. 07/25/19   [provider]   Dulaglutide (TRULICITY) 8.78 MV/6.7MC SOPN Inject into the skin.    [provider]  DULoxetine (CYMBALTA) 60 MG capsule Take 60 mg by mouth daily.    [provider]  gabapentin (NEURONTIN) 400 MG capsule Take 1 capsule (400 mg total) by mouth 3 (three) times daily with meals. 01/14/20 02/13/20  Kayleen Memos, DO  ipratropium (ATROVENT) 0.03 % nasal spray Place 2 sprays into both nostrils 2 (two) times daily as needed.    [provider]  Multiple Vitamin (MULTIVITAMIN) tablet Take 1 tablet by mouth daily.    [provider]  Netarsudil-Latanoprost (ROCKLATAN) 0.02-0.005 % SOLN Place 1 drop into the right eye at bedtime. 11/14/19   [provider]    Allergies    Ambien [zolpidem tartrate], Amlodipine besylate, Aspirin, and Dorzolamide hcl-timolol mal  Review of Systems   Review of Systems  Constitutional: Negative for appetite change, chills and fever.  Respiratory: Negative for shortness of breath.   Cardiovascular: Negative for chest pain.  Gastrointestinal: Negative for abdominal pain, diarrhea and vomiting.  Genitourinary: Negative for dysuria.  Musculoskeletal: Positive for arthralgias, joint swelling and myalgias. Negative for back pain, neck pain and neck stiffness.  Skin: Positive for color change and wound. Negative for rash.  Allergic/Immunologic: Negative for immunocompromised state.  Neurological: Negative for dizziness, seizures, syncope, weakness, numbness and headaches.  Psychiatric/Behavioral: Negative for confusion.    Physical Exam Updated Vital Signs BP 127/63   Pulse 98   Temp 98.4 F (36.9 C) (Oral)   Resp 17   Wt 114 kg   SpO2 96%   BMI 37.11 kg/m   Physical Exam Vitals and nursing note reviewed.  Constitutional:      General: He is not in acute distress.    Appearance: He is well-developed. He is obese. He is not ill-appearing, toxic-appearing or diaphoretic.     Comments: Chronically ill-appearing  HENT:      Head: Normocephalic.  Eyes:     Conjunctiva/sclera: Conjunctivae normal.  Cardiovascular:     Rate and Rhythm: Normal rate and regular rhythm.     Heart sounds: No murmur heard.   Pulmonary:     Effort: Pulmonary effort is normal. No respiratory distress.     Breath sounds: No stridor. No wheezing, rhonchi or rales.  Chest:     Chest wall: No tenderness.  Abdominal:     General: There is no distension.     Palpations: Abdomen is soft.     Tenderness: There is no abdominal tenderness.  Musculoskeletal:     Cervical back: Neck supple.     Comments: Erythema and swelling noted to the right fourth digit extending into the right hand.  Ecchymotic changes noted over the fourth MCP.  Red streaks are extending up the patient's right forearm.  Purulent drainage is noted on the dorsum of the right fourth digit.  There is a large bulla noted on the palm of the right hand.  See photo below.  Sensation is decreased throughout the bilateral upper extremities, which is baseline.  Radial pulses are 2+ and symmetric.  Full active and passive range of motion of the right wrist, elbow, and shoulder.  Skin:    General: Skin is warm and dry.  Neurological:     Mental Status: He is alert.  Psychiatric:        Behavior: Behavior normal.           ED Results / Procedures / Treatments   Labs (all labs ordered are listed, but only abnormal results are displayed) Labs Reviewed  LACTIC ACID, PLASMA - Abnormal; Notable for the following components:      Result Value   Lactic Acid, Venous 2.5 (*)    All other components within normal limits  LACTIC ACID, PLASMA - Abnormal; Notable for the following components:   Lactic Acid, Venous 2.4 (*)    All other components within normal limits  COMPREHENSIVE METABOLIC PANEL - Abnormal; Notable for the following components:   Sodium 133 (*)    Glucose, Bld 110 (*)    Albumin 2.9 (*)    All other components within normal limits  CBC WITH  DIFFERENTIAL/PLATELET - Abnormal; Notable for the following components:   WBC 13.4 (*)    Neutro Abs 10.1 (*)    Monocytes Absolute 1.1 (*)    Abs Immature Granulocytes 0.08 (*)    All other components within normal limits  CULTURE, BLOOD (ROUTINE X 2)  CULTURE, BLOOD (ROUTINE X 2)  RESP PANEL BY RT-PCR (FLU A&B, COVID) ARPGX2  URINALYSIS, ROUTINE W REFLEX MICROSCOPIC  CBC  BASIC METABOLIC PANEL  HEMOGLOBIN A1C    EKG None  Radiology DG Hand Complete Right  Result Date: 04/10/2020 CLINICAL DATA:  Right hand wound EXAM: RIGHT HAND - COMPLETE 3+ VIEW COMPARISON:  None. FINDINGS: Soft tissue swelling in the right ring finger. Bone destruction in the distal phalanx concerning for osteomyelitis. Moderate to advanced degenerative changes in the IP joints and 1st carpometacarpal joint. IMPRESSION: Lucency in the distal phalanx of the right ring finger with bone destruction concerning for osteomyelitis. Diffuse soft tissue swelling throughout the right ring finger. Electronically Signed   By: Rolm Baptise M.D.   On: 04/10/2020 02:06    Procedures .Critical Care Performed by: Joanne Gavel, PA-C Authorized by: Joanne Gavel, PA-C   Critical care provider statement:    Critical care time (minutes):  45   Critical care time was exclusive of:  Separately billable procedures and treating other patients and teaching time   Critical care was necessary to treat or prevent imminent or life-threatening deterioration of the following conditions:  Sepsis   Critical care was time spent personally by me on the following activities:  Ordering and performing treatments and interventions, ordering and review of laboratory studies, ordering and review of radiographic studies, pulse oximetry, re-evaluation of patient's condition, review of old charts, discussions with consultants, development of treatment plan with patient or surrogate, evaluation of patient's response to treatment, examination of patient  and obtaining history from patient or surrogate   I assumed direction of critical care for this patient from another provider in my specialty: no     Care discussed  with: admitting provider       Medications Ordered in ED Medications  vancomycin (VANCOREADY) IVPB 2000 mg/400 mL (has no administration in time range)  acetaminophen (TYLENOL) tablet 650 mg (has no administration in time range)    Or  acetaminophen (TYLENOL) suppository 650 mg (has no administration in time range)  ondansetron (ZOFRAN) tablet 4 mg (has no administration in time range)    Or  ondansetron (ZOFRAN) injection 4 mg (has no administration in time range)  insulin aspart (novoLOG) injection 0-9 Units (has no administration in time range)  HYDROmorphone (DILAUDID) injection 0.5-1 mg (has no administration in time range)  ceFEPIme (MAXIPIME) 2 g in sodium chloride 0.9 % 100 mL IVPB (has no administration in time range)  vancomycin (VANCOREADY) IVPB 1250 mg/250 mL (has no administration in time range)  metroNIDAZOLE (FLAGYL) IVPB 500 mg (has no administration in time range)  sodium chloride 0.9 % bolus 1,000 mL (0 mLs Intravenous Stopped 04/10/20 0246)  ceFEPIme (MAXIPIME) 2 g in sodium chloride 0.9 % 100 mL IVPB (0 g Intravenous Stopped 04/10/20 0246)  fentaNYL (SUBLIMAZE) injection 100 mcg (100 mcg Intravenous Given 04/10/20 0154)    ED Course  I have reviewed the triage vital signs and the nursing notes.  Pertinent labs & imaging results that were available during my care of the patient were reviewed by me and considered in my medical decision making (see chart for details).    MDM Rules/Calculators/A&P                          81 year old with a history of diabetes mellitus type 2, peripheral neuropathy, CKD, hypertension, obesity, hypertriglyceridemia, venous insufficiency, and glaucoma who presents to emergency department with a chief complaint of right upper extremity wound.  Patient reports that he sustained a  wound to the right index finger approximately 3 weeks ago via an unknown mechanism.  He has failed outpatient antibiotics that were started earlier this week.  No constitutional symptoms.  Mildly tachycardic.  Afebrile.  Normotensive.  The patient was seen and independently evaluated by Dr. Christy Gentles, attending physician.  On exam, the patient has extensive infection originating from the right index finger that is now tracking up the right forearm.  There is a large bulla on the palm of the right hand and purulent drainage noted on the dorsum of the finger.  Labs and imaging have been reviewed and independently interpreted by me.  He has a leukocytosis with an elevated lactate and meets sepsis criteria.  No metabolic derangements.  Blood cultures x2 have been collected and will start the patient on cefepime and vancomycin.  We will give IV fluids for elevated lactate.  X-ray of the right hand with lucency in the distal phalanx of the right ring finger with bony destruction concerning for osteomyelitis.  There is also diffuse soft tissue swelling throughout the right ring finger.  Consult to hand surgery and spoke with Dr. Caralyn Guile.  He recommends keeping the patient n.p.o. and to have a COVID-19 test ordered.  He will plan to take the patient to the OR in the a.m.  Consult to the hospitalist. Dr. Alcario Drought will accept the patient for admission. The patient appears reasonably stabilized for admission considering the current resources, flow, and capabilities available in the ED at this time, and I doubt any other [facility] requiring further screening and/or treatment in the ED prior to admission.   Final Clinical Impression(s) / ED Diagnoses Final diagnoses:  Open wound of right ring finger  Osteomyelitis of right hand, unspecified type ([place])  Open wound of right forearm, initial encounter    Rx / DC Orders ED Discharge Orders    None       Joanne Gavel, PA-C 04/10/20 0258    Ripley Fraise,  MD 04/10/20 2357

## 2020-04-10 NOTE — Anesthesia Preprocedure Evaluation (Addendum)
Anesthesia Evaluation  Patient identified by MRN, date of birth, ID band Patient awake    Reviewed: Allergy & Precautions, NPO status , Patient's Chart, lab work & pertinent test results  History of Anesthesia Complications Negative for: history of anesthetic complications  Airway Mallampati: II  TM Distance: >3 FB Neck ROM: Full    Dental  (+) Edentulous Upper, Edentulous Lower   Pulmonary sleep apnea (does not use CPAP) , COPD,  COPD inhaler, former smoker,    breath sounds clear to auscultation       Cardiovascular hypertension, Pt. on medications (-) angina+ DOE  + dysrhythmias Atrial Fibrillation + Valvular Problems/Murmurs (mild-mod) AS  Rhythm:Regular Rate:Normal + Systolic murmurs '21 Stress Myoview:Nuclear stress EF: 51%, no wall motion abnormalities, no ST segment deviation noted during stress. This is a low risk study, with No evidence of ischemia or infarction.  '21 ECHO: low normal LVF with EF 55-60%, grade 1 DD, Mild-moderate aortic valve stenosis. AVA VTI measures 1.37 cm, mean gradient 15.0 mmHg, Vmax  2.56 m/s. mild dilatation of the ascending aorta, 42 mm.    Neuro/Psych Diabetic neuropathy    GI/Hepatic negative GI ROS, Neg liver ROS,   Endo/Other  diabetes (glu 101), Oral Hypoglycemic AgentsMorbid obesity  Renal/GU Renal InsufficiencyRenal disease (creat 1.51)     Musculoskeletal   Abdominal (+) + obese,   Peds  Hematology eliquis   Anesthesia Other Findings   Reproductive/Obstetrics                            Anesthesia Physical Anesthesia Plan  ASA: III  Anesthesia Plan: General   Post-op Pain Management:    Induction: Intravenous  PONV Risk Score and Plan: 2 and Ondansetron and Dexamethasone  Airway Management Planned: Oral ETT  Additional Equipment: None  Intra-op Plan:   Post-operative Plan: Extubation in OR  Informed Consent: I have reviewed the  patients History and Physical, chart, labs and discussed the procedure including the risks, benefits and alternatives for the proposed anesthesia with the patient or authorized representative who has indicated his/her understanding and acceptance.       Plan Discussed with: CRNA and Surgeon  Anesthesia Plan Comments:        Anesthesia Quick Evaluation

## 2020-04-10 NOTE — Anesthesia Procedure Notes (Signed)
Procedure Name: Intubation Date/Time: 04/10/2020 12:00 PM Performed by: Georgia Duff, CRNA Pre-anesthesia Checklist: Patient identified, Emergency Drugs available, Suction available and Patient being monitored Patient Re-evaluated:Patient Re-evaluated prior to induction Oxygen Delivery Method: Circle System Utilized Preoxygenation: Pre-oxygenation with 100% oxygen Induction Type: IV induction Ventilation: Mask ventilation without difficulty Laryngoscope Size: Miller and 2 Grade View: Grade I Tube type: Oral Tube size: 7.5 mm Number of attempts: 1 Airway Equipment and Method: Stylet and Oral airway Placement Confirmation: ETT inserted through vocal cords under direct vision,  positive ETCO2 and breath sounds checked- equal and bilateral Secured at: 23 cm Tube secured with: Tape Dental Injury: Teeth and Oropharynx as per pre-operative assessment

## 2020-04-10 NOTE — H&P (Addendum)
History and Physical    [name] HLK:[id number] DOB: 17-May-1940 DOA: 04/09/2020  PCP: Lavone Orn, MD  Patient coming from: Home  I have personally briefly reviewed patient's old medical records in [place]  Chief Complaint: Finger infection  HPI: [name] is a 80 y.o. male with medical history significant of DM2, peripheral neuropathy, OSA, obesity, PAF on eliquis.  Pt injured R ring finger about 3 weeks ago.  Suspects that he sustained a burn because he noticed blistering on his finger, but is unsure as to the mechanism of injury due to severe peripheral neuropathy (wasn't initially painful).  Since that time he has noticed significantly worsening redness, swelling to finger that has progressed to hand.  Purulent drainage from finger for past several days.  Finally saw PCP who started him on bactrim.  Despite taking as prescribed no improvement.  No fevers, chills, CP, SOB.  Saw PCP again yesterday and advised to come in to ED for further eval.   ED Course: See physical exam below for images of finger.  WBC 13k.  X ray suggestive of osteomyelitis.  Pt started on cefepime + vanc  Dr. Caralyn Guile will see pt in AM.  Pt is poor historian.  He says his wife manages his meds.  He is taking eliquis, isnt clear on timing of last dose, usually takes evening meds at 10pm, although it looks like pt was already in ED at that time.   Review of Systems: As per HPI, otherwise all review of systems negative.  Past Medical History:  Diagnosis Date  . Adenomatous colon polyp   . Allergic rhinitis   . BMI 40.0-44.9, adult ([place])   . Chronic insomnia   . Chronic low back pain   . Constipation, unspecified   . Diabetes mellitus ([place]) 2015  . DJD (degenerative joint disease) of knee   . Hypercalcemia   . Hypertension   . Hypertriglyceridemia   . 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 Lumbar facet arthropathy   . Morbid obesity ([place])   . Neuropathy   . Nonallergic  rhinitis   . OSA (obstructive sleep apnea)   . Other chronic pain   . Peripheral axonal neuropathy   . Upper GI bleed    due to aspirin  . Venous insufficiency   . Venous stasis dermatitis of both lower extremities     Past Surgical History:  Procedure Laterality Date  . cataract     Bilateral  . GLAUCOMA SURGERY    . HAND SURGERY Left    s/p amputation of left finger     reports that he has quit smoking. He has never used smokeless tobacco. He reports that he does not drink alcohol and does not use drugs.  Allergies  Allergen Reactions  . Ambien [Zolpidem Tartrate]     "lost his mind"  . Amlodipine Besylate Other (See Comments)    Other reaction(s): edema edema   . Aspirin     Bleeding ulcer  . Dorzolamide Hcl-Timolol Mal     Other reaction(s): Other (See Comments), red and burning    Family History  Problem Relation Age of Onset  . Gout Father   . Osteoarthritis Mother        Deceased, 11  . Healthy Brother        x2  . Healthy Son        x2     Prior to Admission medications   Medication Sig Start Date End Date Taking?  Authorizing Provider  acetaminophen (TYLENOL) 500 MG tablet Take 1,000 mg by mouth every 6 (six) hours as needed for moderate pain.    [provider]  amLODipine (NORVASC) 5 MG tablet Take 5 mg by mouth daily. 11/12/19   [provider]  apixaban (ELIQUIS) 5 MG TABS tablet Take 1 tablet (5 mg total) by mouth 2 (two) times daily. 01/28/20 04/27/20  Minus Breeding, MD  COMBIGAN 0.2-0.5 % ophthalmic solution Place 1 drop into the right eye 2 (two) times daily. 07/25/19   [provider]  Dulaglutide (TRULICITY) 0.01 VC/9.4WH SOPN Inject into the skin.    [provider]  DULoxetine (CYMBALTA) 60 MG capsule Take 60 mg by mouth daily.    [provider]  gabapentin (NEURONTIN) 400 MG capsule Take 1 capsule (400 mg total) by mouth 3 (three) times daily with meals. 01/14/20 02/13/20  Kayleen Memos, DO   ipratropium (ATROVENT) 0.03 % nasal spray Place 2 sprays into both nostrils 2 (two) times daily as needed.    [provider]  Multiple Vitamin (MULTIVITAMIN) tablet Take 1 tablet by mouth daily.    [provider]  Netarsudil-Latanoprost (ROCKLATAN) 0.02-0.005 % SOLN Place 1 drop into the right eye at bedtime. 11/14/19   [provider]    Physical Exam: Vitals:   04/10/20 0001 04/10/20 0049 04/10/20 0100 04/10/20 0200  BP: 133/78 (!) 141/83  127/63  Pulse: (!) 116 (!) 101  98  Resp: 18 18  17   Temp: 99.9 F (37.7 C) 98.4 F (36.9 C)    TempSrc: Oral Oral    SpO2: 95% 98%  96%  Weight:   114 kg     Constitutional: NAD, calm, comfortable Eyes: PERRL, lids and conjunctivae normal ENMT: Mucous membranes are moist. Posterior pharynx clear of any exudate or lesions.Normal dentition.  Neck: normal, supple, no masses, no thyromegaly Respiratory: clear to auscultation bilaterally, no wheezing, no crackles. Normal respiratory effort. No accessory muscle use.  Cardiovascular: Regular rate and rhythm, no murmurs / rubs / gallops. No extremity edema. 2+ pedal pulses. No carotid bruits.  Abdomen: no tenderness, no masses palpated. No hepatosplenomegaly. Bowel sounds positive.  Musculoskeletal: no clubbing / cyanosis. No joint deformity upper and lower extremities. Good ROM, no contractures. Normal muscle tone.  Skin:      Neurologic: CN 2-12 grossly intact. Sensation intact, DTR normal. Strength 5/5 in all 4.  Psychiatric: Normal judgment and insight. Alert and oriented x 3. Normal mood.    Labs on Admission: I have personally reviewed following labs and imaging studies  CBC: Recent Labs  Lab 04/09/20 2016  WBC 13.4*  NEUTROABS 10.1*  HGB 13.7  HCT 42.7  MCV 94.1  PLT 675   Basic Metabolic Panel: Recent Labs  Lab 04/09/20 2016  NA 133*  K 4.5  CL 101  CO2 23  GLUCOSE 110*  BUN 13  CREATININE 1.21  CALCIUM 9.8   GFR: Estimated Creatinine  Clearance: 60.6 mL/min (by C-G formula based on SCr of 1.21 mg/dL). Liver Function Tests: Recent Labs  Lab 04/09/20 2016  AST 31  ALT 39  ALKPHOS 87  BILITOT 0.7  PROT 7.3  ALBUMIN 2.9*   No results for input(s): LIPASE, AMYLASE in the last 168 hours. No results for input(s): AMMONIA in the last 168 hours. Coagulation Profile: No results for input(s): INR, PROTIME in the last 168 hours. Cardiac Enzymes: No results for input(s): CKTOTAL, CKMB, CKMBINDEX, TROPONINI in the last 168 hours. BNP (last 3  results) No results for input(s): PROBNP in the last 8760 hours. HbA1C: No results for input(s): HGBA1C in the last 72 hours. CBG: No results for input(s): GLUCAP in the last 168 hours. Lipid Profile: No results for input(s): CHOL, HDL, LDLCALC, TRIG, CHOLHDL, LDLDIRECT in the last 72 hours. Thyroid Function Tests: No results for input(s): TSH, T4TOTAL, FREET4, T3FREE, THYROIDAB in the last 72 hours. Anemia Panel: No results for input(s): VITAMINB12, FOLATE, FERRITIN, TIBC, IRON, RETICCTPCT in the last 72 hours. Urine analysis:    Component Value Date/Time   COLORURINE YELLOW 01/06/2020 0805   APPEARANCEUR CLEAR 01/06/2020 0805   LABSPEC 1.017 01/06/2020 0805   PHURINE 5.0 01/06/2020 0805   GLUCOSEU NEGATIVE 01/06/2020 0805   HGBUR NEGATIVE 01/06/2020 0805   BILIRUBINUR NEGATIVE 01/06/2020 0805   KETONESUR NEGATIVE 01/06/2020 0805   PROTEINUR NEGATIVE 01/06/2020 0805   NITRITE NEGATIVE 01/06/2020 0805   LEUKOCYTESUR NEGATIVE 01/06/2020 0805    Radiological Exams on Admission: DG Hand Complete Right  Result Date: 04/10/2020 CLINICAL DATA:  Right hand wound EXAM: RIGHT HAND - COMPLETE 3+ VIEW COMPARISON:  None. FINDINGS: Soft tissue swelling in the right ring finger. Bone destruction in the distal phalanx concerning for osteomyelitis. Moderate to advanced degenerative changes in the IP joints and 1st carpometacarpal joint. IMPRESSION: Lucency in the distal phalanx of the right  ring finger with bone destruction concerning for osteomyelitis. Diffuse soft tissue swelling throughout the right ring finger. Electronically Signed   By: Rolm Baptise M.D.   On: 04/10/2020 02:06    EKG: Independently reviewed.  Assessment/Plan Principal Problem:   Acute osteomyelitis of hand including fingers, right (HCC) Active Problems:   Diabetes mellitus type 2 in obese ([place])   Infection of right hand   PAF (paroxysmal atrial fibrillation) ([place])    1. Acute osteomyelitis of R hand - 1. Limb threatening infection 2. Empiric cefepime, flagyl, vanc for the moment 3. Dr. Caralyn Guile to see in AM 1. NPO 2. Holding eliquis 3. Last dose of eliquis was presumably yesterday in AM 4. Checking INR to see if eliquis still active at this time. 4. IVF: 1L bolus in ED and LR at 125 5. Pain ctrl with dilaudid PRN. 2. DM2 - 1. Sensitive SSI Q4H 3. HTN - 1. Resume home BP meds when med rec is completed 4. PAF - 1. Hold eliquis as above  DVT prophylaxis: SCDs Code Status: Full Family Communication: No family in room Disposition Plan: Home after management of finger infection Consults called: Dr. Caralyn Guile Admission status: Admit to inpatient  Severity of Illness: The appropriate patient status for this patient is INPATIENT. Inpatient status is judged to be reasonable and necessary in order to provide the required intensity of service to ensure the patient's safety. The patient's presenting symptoms, physical exam findings, and initial radiographic and laboratory data in the context of their chronic comorbidities is felt to place them at high risk for further clinical deterioration. Furthermore, it is not anticipated that the patient will be medically stable for discharge from the hospital within 2 midnights of admission. The following factors support the patient status of inpatient.   IP status for limb threatening infection of the R hand.  See pictures.   * I certify that at the point of  admission it is my clinical judgment that the patient will require inpatient hospital care spanning beyond 2 midnights from the point of admission due to high intensity of service, high risk for further deterioration and high frequency of surveillance required.*  Orvile Corona Jerilynn Mages DO [facility]  How to contact the [facility] Attending or Consulting provider [place] or covering provider during after hours [place], for this patient?  1. Check the care team in [facility] and look for a) attending/consulting TR[facility] provider listed and b) the [facility] team listed 2. Log into [email or web address]  Amion Physician Scheduling and messaging for groups and whole hospitals  On call and physician scheduling software for group practices, residents, hospitalists and other medical providers for call, clinic, rotation and shift schedules. On[employer] is a hospital-wide system for scheduling doctors and paging doctors on call. EasyPlot is for scientific plotting and data analysis.  [email or web address]  and use [place]'s universal password to access. If you do not have the password, please contact the hospital operator.  3. Locate the [facility], The provider you are looking for under [facility] and page to a number that you can be directly reached. 4. If you still have difficulty reaching the provider, please page the [facility] (Director on Call) for the Hospitalists listed on amion for assistance.  04/10/2020, 2:48 AM

## 2020-04-10 NOTE — Transfer of Care (Signed)
Immediate Anesthesia Transfer of Care Note  Patient: [name]  Procedure(s) Performed: IRRIGATION AND DEBRIDEMENT AND AMPUTATION OF RIGHT RING FINGER (Right Ring Finger)  Patient Location: PACU  Anesthesia Type:General  Level of Consciousness: drowsy and patient cooperative  Airway & Oxygen Therapy: Patient Spontanous Breathing and Patient connected to nasal cannula oxygen  Post-op Assessment: Report given to RN and Post -op Vital signs reviewed and stable  Post vital signs: Reviewed and stable  Last Vitals:  Vitals Value Taken Time  BP 121/49 04/10/20 1320  Temp 36.5 C 04/10/20 1250  Pulse 75 04/10/20 1325  Resp 14 04/10/20 1325  SpO2 97 % 04/10/20 1325  Vitals shown include unvalidated device data.  Last Pain:  Vitals:   04/10/20 1305  TempSrc:   PainSc: 0-No pain      Patients Stated Pain Goal: 0 (11/57/26 2035)  Complications: No complications documented.

## 2020-04-10 NOTE — Progress Notes (Signed)
Pharmacy Antibiotic Note  [name] is a 80 y.o. male admitted on 04/09/2020 with R Ring finger infection with confirmed osteomyelitis on imaging. Orthopedics consulted; plan for I&D in the OR tomorrow.  Pharmacy has been consulted for vancomycin and cefepime dosing.  Patient's CrCl ~48 ml/min today; will dose adjust cefepime 2g q12hr and vancomycin 1250mg  x1 with variable renal function - vancomycin dose to be given ~36 hours following LD from this AM. Anticipated half-life is ~19 hours, so 24 hour dosing will likely be too frequent based on current Scr. Will follow for changes in renal function and update dosing as appropriate. Will plan to check a random level ~24 hours after dose tomorrow should Scr remain consistent.  Plan: Adjust vancomycin 1250mg  x1 tomorrow (~36 hours after LD) Adjust cefepime 2g q12hr Continue metronidazole 500mg  q8hr per MD Follow renal function, orthopedics plans, cultures, and vancomycin levels as appropriate   Height: 5\' 9"  (175.3 cm) Weight: 111.1 kg (245 lb) IBW/kg (Calculated) : 70.7  Temp (24hrs), Avg:98.9 F (37.2 C), Min:97.4 F (36.3 C), Max:99.9 F (37.7 C)  Recent Labs  Lab 04/09/20 2016 04/10/20 0157 04/10/20 0534  WBC 13.4*  --  11.7*  CREATININE 1.21  --  1.51*  LATICACIDVEN 2.5* 2.4*  --     Estimated Creatinine Clearance: 48 mL/min (A) (by C-G formula based on SCr of 1.51 mg/dL (H)).    Allergies  Allergen Reactions  . Ambien [Zolpidem Tartrate]     "lost his mind"  . Amlodipine Besylate Other (See Comments)    Other reaction(s): edema edema   . Aspirin     Bleeding ulcer  . Dorzolamide Hcl-Timolol Mal     Other reaction(s): Other (See Comments), red and burning    Antimicrobials this admission: Bactrim PTA 4/2 Cefepime >>  4/2 Vanc >>  4/2 Flagyl >>  Microbiology results: 4/1 Bcx ngtd MRSA PCR sent  Thank you for allowing pharmacy to be a part of this patient's care.  Alfonse Spruce, PharmD PGY2 ID Pharmacy  Resident Phone between 7 am - 3:30 pm: [phone]  Please check AMION for all [place] phone numbers After 10:00 PM, call [place] [phone]  04/10/2020 10:47 AM

## 2020-04-10 NOTE — Op Note (Signed)
PREOPERATIVE DIAGNOSIS: Right ring finger osteomyelitis and gangrene  POSTOPERATIVE DIAGNOSIS: Same  ATTENDING SURGEON: Dr. Iran Planas who scrubbed and present for the entire procedure  ASSISTANT SURGEON: None  ANESTHESIA: General via endotracheal tube  OPERATIVE PROCEDURE: Right ring finger amputation through the level of the metacarpal, ray amputation right ring finger.  With local neurectomies and primary closure.  IMPLANTS: None  EBL: Minimal  RADIOGRAPHIC INTERPRETATION: None  SURGICAL INDICATIONS: Patient is a right-hand-dominant diabetic with a worsening infection for over 3 weeks.  Patient presented to the ER with the significant highly infected ring finger with draining purulence and gangrene to the fingertip.  Patient was seen and evaluated and recommended undergo the above procedure.  The risks of surgery include but not limited to bleeding infection damage nearby nerves arteries or tendons persistent infection and need for further surgical invention.  SURGICAL TECHNIQUE: Patient was brought identified the preoperative holding area marked apart a marker made on the right hand indicate correct operative site.  Patient brought back operating placed supine on anesthesia table where the general anesthetic was administered.  Preoperative antibiotics have been given.  Well-padded tourniquet placed on the right brachium and seal with the appropriate drape.  The right upper extremities then prepped and draped in normal sterile fashion.  A timeout was called the correct site identified procedure then begun.  Attention then turned to the ring finger and mid axial incision was made in the ring finger.  Dissection carried out to the skin and subcutaneous tissue.  This was done from the distal phalanx all the way to the distal digital crease the flexor sheath was exposed and there was no viable tissue.  Both flexor tendons were grossly infected the FDP had ruptured.  The skin and soft tissues  were in very bad and infected condition.  Based on the significant infection it was decided then to proceed with amputation through healthy tissue bed.  A racquet shaped incision was then made around the finger circumferentially and extended dorsally.  The metacarpal shaft was then exposed.  Resection was then carried out through the metacarpal shaft resection was then carried out to allow for amputation through the level of the ring finger metacarpal.  The patient had moderate amount of fluid within the MCP joint.  Flexor tendons and the flexor sheath was then carefully resected.  Digital nerve resection was then carried out as well as the vascular bundles were then identified and resected.  After amputation the wound was then thoroughly irrigated.  Copious wound irrigation done.  Debridement was then carried out with any remaining the devitalized tissue.  The skin was then loosely closed with simple 3-0 Prolene sutures.  Adaptic dressing a sterile compressive bandage then applied.  The patient tolerated procedure well returned recovery in good condition.  POSTOPERATIVE PLAN: Patient will be admitted back to the internal medicine service.  We will need to look at the wound on Tuesday or Wednesday of this week.  The patient may require repeat I&D this week based on his response to the IV antibiotics and wound care.  Keep the dressing on until we see him back on Tuesday or Wednesday.  Likely will see him on Tuesday.  Please contact me should any issues or concerns arise.

## 2020-04-11 ENCOUNTER — Encounter (HOSPITAL_COMMUNITY): Payer: Self-pay | Admitting: Orthopedic Surgery

## 2020-04-11 DIAGNOSIS — M86141 Other acute osteomyelitis, right hand: Secondary | ICD-10-CM | POA: Diagnosis not present

## 2020-04-11 LAB — BASIC METABOLIC PANEL
Anion gap: 9 (ref 5–15)
BUN: 20 mg/dL (ref 8–23)
CO2: 26 mmol/L (ref 22–32)
Calcium: 9.6 mg/dL (ref 8.9–10.3)
Chloride: 102 mmol/L (ref 98–111)
Creatinine, Ser: 1.46 mg/dL — ABNORMAL HIGH (ref 0.61–1.24)
GFR, Estimated: 48 mL/min — ABNORMAL LOW (ref >60)
Glucose, Bld: 131 mg/dL — ABNORMAL HIGH (ref 70–99)
Potassium: 5 mmol/L (ref 3.5–5.1)
Sodium: 137 mmol/L (ref 135–145)

## 2020-04-11 LAB — URINALYSIS, ROUTINE W REFLEX MICROSCOPIC
Bilirubin Urine: NEGATIVE
Glucose, UA: NEGATIVE mg/dL
Ketones, ur: NEGATIVE mg/dL
Nitrite: NEGATIVE
Protein, ur: NEGATIVE mg/dL
RBC / HPF: >50 RBC/hpf — ABNORMAL HIGH (ref 0–5)
Specific Gravity, Urine: 1.014 (ref 1.005–1.030)
pH: 5 (ref 5.0–8.0)

## 2020-04-11 LAB — LACTIC ACID, PLASMA: Lactic Acid, Venous: 1.1 mmol/L (ref 0.5–1.9)

## 2020-04-11 LAB — GLUCOSE, CAPILLARY
Glucose-Capillary: 138 mg/dL — ABNORMAL HIGH (ref 70–99)
Glucose-Capillary: 109 mg/dL — ABNORMAL HIGH (ref 70–99)
Glucose-Capillary: 166 mg/dL — ABNORMAL HIGH (ref 70–99)
Glucose-Capillary: 114 mg/dL — ABNORMAL HIGH (ref 70–99)
Glucose-Capillary: 124 mg/dL — ABNORMAL HIGH (ref 70–99)

## 2020-04-11 LAB — MAGNESIUM: Magnesium: 2.2 mg/dL (ref 1.7–2.4)

## 2020-04-11 MED ORDER — SODIUM CHLORIDE 0.9 % IV SOLN: INTRAVENOUS | Status: AC

## 2020-04-11 MED ORDER — GABAPENTIN 600 MG PO TABS
600.0000 mg | ORAL_TABLET | Freq: Two times a day (BID) | ORAL | Status: DC
  Administered 2020-04-11 – 2020-04-14 (×7): 600 mg via ORAL
  Filled 2020-04-11 (×8): qty 1

## 2020-04-11 MED ORDER — SENNOSIDES-DOCUSATE SODIUM 8.6-50 MG PO TABS
1.0000 | ORAL_TABLET | Freq: Two times a day (BID) | ORAL | Status: DC
  Administered 2020-04-11 – 2020-04-14 (×7): 1 via ORAL
  Filled 2020-04-11 (×7): qty 1

## 2020-04-11 MED ORDER — METOPROLOL TARTRATE 12.5 MG HALF TABLET
12.5000 mg | ORAL_TABLET | Freq: Two times a day (BID) | ORAL | Status: DC
  Administered 2020-04-11 – 2020-04-14 (×7): 12.5 mg via ORAL
  Filled 2020-04-11 (×7): qty 1

## 2020-04-11 MED ORDER — OXYCODONE-ACETAMINOPHEN 5-325 MG PO TABS
1.0000 | ORAL_TABLET | Freq: Three times a day (TID) | ORAL | Status: DC | PRN
  Administered 2020-04-11 – 2020-04-14 (×4): 1 via ORAL
  Filled 2020-04-11 (×4): qty 1

## 2020-04-11 MED ORDER — NETARSUDIL-LATANOPROST 0.02-0.005 % OP SOLN
1.0000 [drp] | Freq: Every day | OPHTHALMIC | Status: DC
  Administered 2020-04-13: 1 [drp] via OPHTHALMIC

## 2020-04-11 MED ORDER — TRAZODONE HCL 50 MG PO TABS
150.0000 mg | ORAL_TABLET | Freq: Every day | ORAL | Status: DC
  Administered 2020-04-11 – 2020-04-13 (×3): 150 mg via ORAL
  Filled 2020-04-11 (×3): qty 3

## 2020-04-11 MED ORDER — APIXABAN 5 MG PO TABS
5.0000 mg | ORAL_TABLET | Freq: Two times a day (BID) | ORAL | Status: DC
  Administered 2020-04-11 – 2020-04-14 (×7): 5 mg via ORAL
  Filled 2020-04-11 (×7): qty 1

## 2020-04-11 NOTE — Plan of Care (Signed)

## 2020-04-11 NOTE — Plan of Care (Signed)

## 2020-04-11 NOTE — Progress Notes (Signed)
PROGRESS NOTE    [name]  NLG:[id number] DOB: 15-Feb-1940 DOA: 04/09/2020 PCP: Lavone Orn, MD    Chief Complaint  Patient presents with  . Finger Injury    Brief Narrative:  [name] is a 80 y.o. male with medical history significant of DM2, peripheral neuropathy, OSA, obesity, PAF on eliquis.  Pt injured R ring finger about 3 weeks ago.  Suspects that he sustained a burn because he noticed blistering on his finger, but is unsure as to the mechanism of injury due to severe peripheral neuropathy (wasn't initially painful).  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 acidosis -cr appears peaked at 1.51, today is 1.46, baseline appear around 1.2 -continue hold lasix and micardis, continue hydration for another 24hrs, except change LR to NS now that metabolic acidosis corrected, k5 today -repeat bmp in am, renal dosing meds  PAF Sinus rhythm on exam Previously not on rate control agent, started lopressor this hospitalization eliquis held on admission due to hand surgery Resume today , oked by ortho Dr Brantley Stage   NIDDM2 On metformin at  home Held On ssi here  Peripheral neuropathy Continue neurontin  HTN Home meds norvasc/micardis/lasix held on admission norvasc also list on allergy list ( rection: swelling) Will start low dose lopressor for now   Body mass index is 36.18 kg/m.. OSA, does not use cpap at home, not on home o2 either       Unresulted Labs (From admission, onward)          Start     Ordered   04/12/20 1700  Vancomycin, random  Once-Timed,   R        04/11/20 1035   04/12/20 0500  CBC  Tomorrow morning,   R        04/11/20 1148   04/12/20 0814  Basic metabolic panel  Daily,   R      04/11/20 1148   04/09/20 1955  Urinalysis, Routine w reflex microscopic  ONCE - STAT,   STAT        04/09/20 1954            DVT prophylaxis: SCD's Start: 04/10/20 1351 SCDs Start: 04/10/20 0242   Code Status: full Family Communication: wife and son at bedside on 4/2 Disposition:   Status is: Inpatient   Dispo: The patient is from: home              Anticipated d/c is to: home              Anticipated d/c date is: >3days                Consultants:   ortho  Procedures:  S/p Right ring finger amputation through the level of  the metacarpal, ray amputation right ring finger.  With local neurectomies and primary closure by Dr Caralyn Guile  Antimicrobials:    Anti-infectives (From admission, onward)   Start     Dose/Rate Route Frequency Ordered Stop   04/11/20 1600  vancomycin (VANCOREADY) IVPB 1250 mg/250 mL        1,250 mg 166.7 mL/hr over 90 Minutes Intravenous  Once 04/10/20 1044     04/11/20 0400  vancomycin (VANCOREADY) IVPB 1250 mg/250 mL  Status:  Discontinued        1,250 mg 166.7 mL/hr over 90 Minutes Intravenous Every 24 hours 04/10/20 0251 04/10/20 1044   04/10/20 1500  ceFEPIme (MAXIPIME) 2 g in sodium chloride 0.9 % 100 mL IVPB        2 g 200 mL/hr over 30 Minutes Intravenous Every 12 hours 04/10/20 1047     04/10/20 1100  ceFEPIme (MAXIPIME) 2 g in sodium chloride 0.9 % 100 mL  IVPB  Status:  Discontinued        2 g 200 mL/hr over 30 Minutes Intravenous Every 8 hours 04/10/20 0251 04/10/20 1047   04/10/20 1044  vancomycin variable dose per unstable renal function (pharmacist dosing)         Does not apply See admin instructions 04/10/20 1044     04/10/20 0300  metroNIDAZOLE (FLAGYL) IVPB 500 mg        500 mg 100 mL/hr over 60 Minutes Intravenous Every 8 hours 04/10/20 0252     04/10/20 0130  ceFEPIme (MAXIPIME) 2 g in sodium chloride 0.9 % 100 mL IVPB        2 g 200 mL/hr over 30 Minutes Intravenous STAT 04/10/20 0116 04/10/20 0246   04/10/20 0130  vancomycin (VANCOREADY) IVPB 2000 mg/400 mL        2,000 mg 200 mL/hr over 120 Minutes Intravenous STAT 04/10/20 0116 04/10/20 0628         Objective: Vitals:   04/10/20 1320 04/10/20 1350 04/10/20 2041 04/11/20 0730  BP: (!) 121/49 127/72 (!) 121/38 140/73  Pulse: 80 79 77 72  Resp: 15 16 16 17   Temp: 98 F (36.7 C) 98.3 F (36.8 C) 98.8 F (37.1 C) 98.3 F (36.8 C)  TempSrc:    Oral  SpO2: 97% 96% 97% 98%  Weight:      Height:        Intake/Output Summary (Last 24 hours) at 04/11/2020 1148 Last data filed at 04/11/2020 1027 Gross per 24 hour  Intake 1687.18 ml  Output 150 ml  Net 1537.18 ml   Filed Weights   04/10/20 0100 04/10/20 0414  Weight: 114 kg 111.1 kg    Examination:  General exam: calm, NAD, aaox3, hard of hearing Respiratory system: Clear to auscultation. Respiratory effort normal. Cardiovascular system: S1 & S2 heard, RRR.  Precordial murmur, report chronic, no pedal edema. Gastrointestinal system: Abdomen is nondistended, soft and nontender. . Normal bowel sounds heard. Central nervous system: Alert and oriented. No focal neurological deficits. Extremities: Right hand postop changes, dressing intact Skin: Right hand postop changes, scattered abrasions in arms and legs Psychiatry: Judgement and insight appear normal. Mood & affect appropriate.     Data Reviewed: I have  personally reviewed following labs and imaging studies  CBC: Recent Labs  Lab 04/09/20 2016 04/10/20 0534 04/10/20 1642  WBC 13.4* 11.7* 10.4  NEUTROABS 10.1*  --  7.4  HGB 13.7 11.4* 11.0*  HCT 42.7 35.3* 34.3*  MCV 94.1 91.7 92.2  PLT 385 292 279  Basic Metabolic Panel: Recent Labs  Lab 04/09/20 2016 04/10/20 0534 04/11/20 0210  NA 133* 134* 137  K 4.5 4.7 5.0  CL 101 101 102  CO2 23 21* 26  GLUCOSE 110* 116* 131*  BUN 13 17 20   CREATININE 1.21 1.51* 1.46*  CALCIUM 9.8 9.1 9.6  MG  --   --  2.2    GFR: Estimated Creatinine Clearance: 48.8 mL/min (A) (by C-G formula based on SCr of 1.46 mg/dL (H)).  Liver Function Tests: Recent Labs  Lab 04/09/20 2016  AST 31  ALT 39  ALKPHOS 87  BILITOT 0.7  PROT 7.3  ALBUMIN 2.9*    CBG: Recent Labs  Lab 04/10/20 2043 04/10/20 2358 04/11/20 0346 04/11/20 0753 04/11/20 1101  GLUCAP 162* 129* 138* 109* 166*     Recent Results (from the past 240 hour(s))  Culture, blood (routine x 2)     Status: None (Preliminary result)   Collection Time: 04/09/20  8:00 PM   Specimen: BLOOD LEFT HAND  Result Value Ref Range Status   Specimen Description BLOOD LEFT HAND  Final   Special Requests AEROBIC BOTTLE ONLY Blood Culture adequate volume  Final   Culture   Final    NO GROWTH < 12 HOURS Performed at [facility], [place] [address] [place], [place] [address]    Report Status PENDING  Incomplete  Culture, blood (routine x 2)     Status: None (Preliminary result)   Collection Time: 04/09/20  8:13 PM   Specimen: BLOOD LEFT ARM  Result Value Ref Range Status   Specimen Description BLOOD LEFT ARM  Final   Special Requests   Final    BOTTLES DRAWN AEROBIC AND ANAEROBIC Blood Culture adequate volume   Culture   Final    NO GROWTH < 12 HOURS Performed at [facility], [place] [address], [place],  [address]    Report Status PENDING  Incomplete  Resp Panel by RT-PCR (Flu A&B, Covid) Nasopharyngeal Swab      Status: None   Collection Time: 04/10/20  2:00 AM   Specimen: Nasopharyngeal Swab; Nasopharyngeal(NP) swabs in vial transport medium  Result Value Ref Range Status   SARS Coronavirus 2 by RT PCR NEGATIVE NEGATIVE Final    Comment: (NOTE) SARS-CoV-2 target nucleic acids are NOT DETECTED.  The SARS-CoV-2 RNA is generally detectable in upper respiratory specimens during the acute phase of infection. The lowest concentration of SARS-CoV-2 viral copies this assay can detect is 138 copies/mL. A negative result does not preclude SARS-Cov-2 infection and should not be used as the sole basis for treatment or other patient management decisions. A negative result may occur with  improper specimen collection/handling, submission of specimen other than nasopharyngeal swab, presence of viral mutation(s) within the areas targeted by this assay, and inadequate number of viral copies(<138 copies/mL). A negative result must be combined with clinical observations, patient history, and epidemiological information. The expected result is Negative.  Fact Sheet for Patients:  EntrepreneurPulse.com.au  Fact Sheet for Healthcare Providers:  IncredibleEmployment.be  This test is no t yet approved or cleared by the [place] FDA and  has been authorized for detection and/or diagnosis of SARS-CoV-2 by FDA under an Emergency Use Authorization (EUA). This EUA will remain  in effect (meaning this test can be used) for the duration of the COVID-19 declaration under Section 564(b)(1) of the Act, 21 U.S.C.section 360bbb-3(b)(1), unless the authorization is terminated  or revoked sooner.  Influenza A by PCR NEGATIVE NEGATIVE Final   Influenza B by PCR NEGATIVE NEGATIVE Final    Comment: (NOTE) The Xpert Xpress SARS-CoV-2/FLU/RSV plus assay is intended as an aid in the diagnosis of influenza from Nasopharyngeal swab specimens and should not be used as a sole basis  for treatment. Nasal washings and aspirates are unacceptable for Xpert Xpress SARS-CoV-2/FLU/RSV testing.  Fact Sheet for Patients: EntrepreneurPulse.com.au  Fact Sheet for Healthcare Providers: IncredibleEmployment.be  This test is not yet approved or cleared by the [place] FDA and has been authorized for detection and/or diagnosis of SARS-CoV-2 by FDA under an Emergency Use Authorization (EUA). This EUA will remain in effect (meaning this test can be used) for the duration of the COVID-19 declaration under Section 564(b)(1) of the Act, 21 U.S.C. section 360bbb-3(b)(1), unless the authorization is terminated or revoked.  Performed at [facility], [place] [address], [place], [place] [address]   MRSA PCR Screening     Status: None   Collection Time: 04/10/20 10:05 AM   Specimen: Nasopharyngeal  Result Value Ref Range Status   MRSA by PCR NEGATIVE NEGATIVE Final    Comment:        The GeneXpert MRSA Assay (FDA approved for NASAL specimens only), is one component of a comprehensive MRSA colonization surveillance program. It is not intended to diagnose MRSA infection nor to guide or monitor treatment for MRSA infections. Performed at [facility], [place] [address] [place], [place] [address]   Aerobic/Anaerobic Culture w Gram Stain (surgical/deep wound)     Status: None (Preliminary result)   Collection Time: 04/10/20  1:03 PM   Specimen: Joint, Finger  Result Value Ref Range Status   Specimen Description FINGER  Final   Special Requests RT RING FINGER  Final   Gram Stain PENDING  Incomplete   Culture   Final    CULTURE REINCUBATED FOR BETTER GROWTH Performed at [facility], [address] [place], [place] [address]    Report Status PENDING  Incomplete  Aerobic/Anaerobic Culture w Gram Stain (surgical/deep wound)     Status: None (Preliminary result)   Collection Time: 04/10/20  1:08 PM   Specimen: Soft Tissue,  Other  Result Value Ref Range Status   Specimen Description TISSUE  Final   Special Requests SOFT TISSUE  Final   Gram Stain PENDING  Incomplete   Culture   Final    FEW STAPHYLOCOCCUS AUREUS SUSCEPTIBILITIES TO FOLLOW Performed at [facility], [place] [address], [place], [place] [address]    Report Status PENDING  Incomplete         Radiology Studies: DG Hand Complete Right  Result Date: 04/10/2020 CLINICAL DATA:  Right hand wound EXAM: RIGHT HAND - COMPLETE 3+ VIEW COMPARISON:  None. FINDINGS: Soft tissue swelling in the right ring finger. Bone destruction in the distal phalanx concerning for osteomyelitis. Moderate to advanced degenerative changes in the IP joints and 1st carpometacarpal joint. IMPRESSION: Lucency in the distal phalanx of the right ring finger with bone destruction concerning for osteomyelitis. Diffuse soft tissue swelling throughout the right ring finger. Electronically Signed   By: Rolm Baptise M.D.   On: 04/10/2020 02:06   DG MINI C-ARM IMAGE ONLY  Result Date: 04/10/2020 There is no interpretation for this exam.  This order is for images obtained during a surgical procedure.  Please See "Surgeries" Tab for more information regarding the procedure.        Scheduled Meds: .  apixaban  5 mg Oral BID  . gabapentin  600 mg Oral BID  . insulin aspart  0-9 Units Subcutaneous Q4H  . metoprolol tartrate  12.5 mg Oral BID  . Netarsudil-Latanoprost  1 drop Right Eye QHS  . senna-docusate  1 tablet Oral BID  . traZODone  150 mg Oral QHS  . vancomycin variable dose per unstable renal function (pharmacist dosing)   Does not apply See admin instructions   Continuous Infusions: . sodium chloride    . ceFEPime (MAXIPIME) IV Stopped (04/11/20 0409)  . metronidazole Stopped (04/11/20 0622)  . vancomycin       LOS: 1 day   Time spent: 41mins Greater than 50% of this time was spent in counseling, explanation of diagnosis, planning of further management, and  coordination of care.   Voice Recognition Viviann Spare dictation system was used to create this note, attempts have been made to correct errors. Please contact the author with questions and/or clarifications.   Florencia Reasons, MD PhD FACP [facility]  Available via Epic secure chat 7am-7pm for nonurgent issues Please page for urgent issues To page the attending provider between 7A-7P or the covering provider during after hours 7P-7A, please log into the web site [email or web address] and access using universal [place] password for that web site. If you do not have the password, please call the hospital operator.    04/11/2020, 11:48 AM

## 2020-04-11 NOTE — Evaluation (Signed)
Physical Therapy Evaluation Patient Details Name: [name] MRN: [id number] DOB: May 12, 1940 Today's Date: 04/11/2020   History of Present Illness  Pt adm 4/1 with acute osteomyelitis of rt ring finger. Underwent amputation of rt ring finger on 4/2. PMH - DM, peripheral neuropathy, obesity, PAF.  Clinical Impression  Pt presents to PT with decr mobility and history of recent falls. Pt and wife wish for pt to return home. Will need rt platform for the walker he already has at home to decr the pressure on the surgical site on the rt hand. Will follow to continue to work toward safer more independent mobility.     Follow Up Recommendations Home health PT;Supervision for mobility/OOB    Equipment Recommendations  Other (comment) (rt platform for walker)    Recommendations for Other Services       Precautions / Restrictions Precautions Precautions: Fall      Mobility  Bed Mobility Overal bed mobility: Needs Assistance Bed Mobility: Supine to Sit;Sit to Supine     Supine to sit: Min assist Sit to supine: Min guard   General bed mobility comments: Assist to elevate trunk into sitting    Transfers Overall transfer level: Needs assistance Equipment used: Rolling walker (2 wheeled);Right platform walker Transfers: Sit to/from Stand Sit to Stand: Min assist         General transfer comment: Assist to bring hips up and for balance. Verbal cues for hand placement  Ambulation/Gait Ambulation/Gait assistance: Min assist Gait Distance (Feet): 5 Feet Assistive device: Rolling walker (2 wheeled);Right platform walker Gait Pattern/deviations: Step-to pattern;Decreased step length - right;Decreased step length - left Gait velocity: decr Gait velocity interpretation: <1.31 ft/sec, indicative of household ambulator General Gait Details: Side stepped up bed using rolling walker with pt propping his rt forearm on walker. Attached platform to rt side of walker and pt amb forward and  backwards 5'.  Stairs            Wheelchair Mobility    Modified Rankin (Stroke Patients Only)       Balance Overall balance assessment: Needs assistance;History of Falls Sitting-balance support: No upper extremity supported;Feet supported Sitting balance-Leahy Scale: Fair     Standing balance support: Single extremity supported;Bilateral upper extremity supported;During functional activity Standing balance-Leahy Scale: Poor Standing balance comment: UE support and min guard for static standing                             Pertinent Vitals/Pain Pain Assessment: Faces Faces Pain Scale: Hurts little more Pain Location: RLE Pain Descriptors / Indicators: Grimacing;Guarding Pain Intervention(s): Monitored during session    Home Living Family/patient expects to be discharged to:: Private residence Living Arrangements: Spouse/significant other Available Help at Discharge: Family;Available 24 hours/day Type of Home: House Home Access: Ramped entrance     Home Layout: One level Home Equipment: Walker - 2 wheels;Walker - 4 wheels      Prior Function Level of Independence: Needs assistance   Gait / Transfers Assistance Needed: Amb with walker with supervision. 2 recent falls since home therapy ended.           Hand Dominance   Dominant Hand: Right    Extremity/Trunk Assessment   Upper Extremity Assessment Upper Extremity Assessment: RUE deficits/detail RUE Deficits / Details: rt ring finger amputation    Lower Extremity Assessment Lower Extremity Assessment: Generalized weakness       Communication   Communication: HOH  Cognition Arousal/Alertness: Awake/alert  Behavior During Therapy: WFL for tasks assessed/performed Overall Cognitive Status: Within Functional Limits for tasks assessed                                 General Comments: History of decr safety awareness      General Comments      Exercises      Assessment/Plan    PT Assessment Patient needs continued PT services  PT Problem List Decreased strength;Decreased activity tolerance;Decreased balance;Decreased mobility;Decreased knowledge of use of DME;Obesity       PT Treatment Interventions DME instruction;Gait training;Functional mobility training;Therapeutic activities;Therapeutic exercise;Balance training;Patient/family education    PT Goals (Current goals can be found in the Care Plan section)  Acute Rehab PT Goals Patient Stated Goal: return home PT Goal Formulation: With patient/family Time For Goal Achievement: 04/18/20 Potential to Achieve Goals: Fair    Frequency Min 3X/week   Barriers to discharge        Co-evaluation               AM-PAC PT "6 Clicks" Mobility  Outcome Measure Help needed turning from your back to your side while in a flat bed without using bedrails?: A Little Help needed moving from lying on your back to sitting on the side of a flat bed without using bedrails?: A Little Help needed moving to and from a bed to a chair (including a wheelchair)?: A Little Help needed standing up from a chair using your arms (e.g., wheelchair or bedside chair)?: A Little Help needed to walk in hospital room?: A Little Help needed climbing 3-5 steps with a railing? : A Lot 6 Click Score: 17    End of Session Equipment Utilized During Treatment: Oxygen Activity Tolerance: Patient tolerated treatment well Patient left: in bed;with call bell/phone within reach;with bed alarm set;with family/visitor present Nurse Communication: Mobility status PT Visit Diagnosis: Unsteadiness on feet (R26.81);Other abnormalities of gait and mobility (R26.89)    Time: 9629-5284 PT Time Calculation (min) (ACUTE ONLY): 28 min   Charges:   PT Evaluation $PT Eval Moderate Complexity: 1 Mod PT Treatments $Gait Training: 8-22 mins        [place] Pager [phone] Office  [place] 04/11/2020, 4:18 PM

## 2020-04-12 DIAGNOSIS — M86141 Other acute osteomyelitis, right hand: Secondary | ICD-10-CM | POA: Diagnosis not present

## 2020-04-12 LAB — CBC
HCT: 35.3 % — ABNORMAL LOW (ref 39.0–52.0)
Hemoglobin: 11.2 g/dL — ABNORMAL LOW (ref 13.0–17.0)
MCH: 29.9 pg (ref 26.0–34.0)
MCHC: 31.7 g/dL (ref 30.0–36.0)
MCV: 94.4 fL (ref 80.0–100.0)
Platelets: 289 10*3/uL (ref 150–400)
RBC: 3.74 MIL/uL — ABNORMAL LOW (ref 4.22–5.81)
RDW: 14.3 % (ref 11.5–15.5)
WBC: 8.9 10*3/uL (ref 4.0–10.5)
nRBC: 0 % (ref 0.0–0.2)

## 2020-04-12 LAB — BASIC METABOLIC PANEL
Anion gap: 7 (ref 5–15)
BUN: 15 mg/dL (ref 8–23)
CO2: 28 mmol/L (ref 22–32)
Calcium: 9.6 mg/dL (ref 8.9–10.3)
Chloride: 103 mmol/L (ref 98–111)
Creatinine, Ser: 1.3 mg/dL — ABNORMAL HIGH (ref 0.61–1.24)
GFR, Estimated: 55 mL/min — ABNORMAL LOW (ref >60)
Glucose, Bld: 126 mg/dL — ABNORMAL HIGH (ref 70–99)
Potassium: 4.3 mmol/L (ref 3.5–5.1)
Sodium: 138 mmol/L (ref 135–145)

## 2020-04-12 LAB — GLUCOSE, CAPILLARY
Glucose-Capillary: 125 mg/dL — ABNORMAL HIGH (ref 70–99)
Glucose-Capillary: 130 mg/dL — ABNORMAL HIGH (ref 70–99)
Glucose-Capillary: 153 mg/dL — ABNORMAL HIGH (ref 70–99)
Glucose-Capillary: 155 mg/dL — ABNORMAL HIGH (ref 70–99)
Glucose-Capillary: 170 mg/dL — ABNORMAL HIGH (ref 70–99)

## 2020-04-12 MED ORDER — VANCOMYCIN HCL 1250 MG/250ML IV SOLN
1250.0000 mg | INTRAVENOUS | Status: DC
  Administered 2020-04-12: 1250 mg via INTRAVENOUS
  Filled 2020-04-12 (×2): qty 250

## 2020-04-12 MED ORDER — CEFAZOLIN SODIUM-DEXTROSE 2-4 GM/100ML-% IV SOLN
2.0000 g | Freq: Three times a day (TID) | INTRAVENOUS | Status: DC
  Administered 2020-04-12 – 2020-04-14 (×5): 2 g via INTRAVENOUS
  Filled 2020-04-12 (×8): qty 100

## 2020-04-12 MED ORDER — POLYETHYLENE GLYCOL 3350 17 G PO PACK
17.0000 g | PACK | Freq: Every day | ORAL | Status: DC
  Administered 2020-04-12 – 2020-04-13 (×2): 17 g via ORAL
  Filled 2020-04-12 (×3): qty 1

## 2020-04-12 NOTE — Plan of Care (Signed)

## 2020-04-12 NOTE — Progress Notes (Signed)
PROGRESS NOTE    [name]  ZOX:[id number] DOB: 1940/05/29 DOA: 04/09/2020 PCP: Lavone Orn, MD    Chief Complaint  Patient presents with  . Finger Injury    Brief Narrative:  ARCENIO Kennedy is a 80 y.o. male with medical history significant of DM2, peripheral neuropathy, OSA, obesity, PAF on eliquis.  Pt injured R ring finger about 3 weeks ago.  Suspects that he sustained a burn because he noticed blistering on his finger, but is unsure as to the mechanism of injury due to severe peripheral neuropathy (wasn't initially painful).  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 -cr appears peaked at 1.51, today is 1.3, baseline appear around 1.2 -He received hydration, now off hydration -continue hold lasix and micardis,  -repeat bmp in am, renal dosing meds  PAF Sinus rhythm on exam Previously not on rate control agent, started lopressor this hospitalization eliquis held on admission due to hand surgery,  resumed on April 3   NIDDM2 On metformin at home Held On ssi here  Peripheral neuropathy Continue neurontin  HTN Home meds norvasc/micardis/lasix held on admission norvasc also list on allergy list ( rection: swelling)  started low dose lopressor    Body mass index is 36.18 kg/m.. OSA, does not use cpap at home, not on home o2 either       Unresulted Labs (From admission, onward)          Start     Ordered   04/12/20 8119  Basic metabolic panel  Daily,   R      04/11/20 1148            DVT prophylaxis: SCD's Start: 04/10/20 1351 SCDs Start: 04/10/20 0242   Code Status: full Family Communication: wife and son at bedside on 4/2, wife at bedside on 4/4 Disposition:   Status is: Inpatient   Dispo: The patient is from: home              Anticipated d/c is to: home with home health              Anticipated d/c date is: >3days, need hand surgeon clearance                Consultants:   ortho  Procedures:  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  With local neurectomies and primary closure by Dr Caralyn Guile  Antimicrobials:    Anti-infectives (From admission, onward)   Start     Dose/Rate Route Frequency Ordered Stop   04/12/20 1800  vancomycin (VANCOREADY) IVPB 1250 mg/250  mL        1,250 mg 166.7 mL/hr over 90 Minutes Intravenous Every 24 hours 04/12/20 1307     04/12/20 1400  ceFAZolin (ANCEF) IVPB 2g/100 mL premix        2 g 200 mL/hr over 30 Minutes Intravenous Every 8 hours 04/12/20 1306     04/11/20 1600  vancomycin (VANCOREADY) IVPB 1250 mg/250 mL        1,250 mg 166.7 mL/hr over 90 Minutes Intravenous  Once 04/10/20 1044 04/11/20 1915   04/11/20 0400  vancomycin (VANCOREADY) IVPB 1250 mg/250 mL  Status:  Discontinued        1,250 mg 166.7 mL/hr over 90 Minutes Intravenous Every 24 hours 04/10/20 0251 04/10/20 1044   04/10/20 1500  ceFEPIme (MAXIPIME) 2 g in sodium chloride 0.9 % 100  mL IVPB  Status:  Discontinued        2 g 200 mL/hr over 30 Minutes Intravenous Every 12 hours 04/10/20 1047 04/12/20 1306   04/10/20 1100  ceFEPIme (MAXIPIME) 2 g in sodium chloride 0.9 % 100 mL IVPB  Status:  Discontinued        2 g 200 mL/hr over 30 Minutes Intravenous Every 8 hours 04/10/20 0251 04/10/20 1047   04/10/20 1044  vancomycin variable dose per unstable renal function (pharmacist dosing)  Status:  Discontinued         Does not apply See admin instructions 04/10/20 1044 04/12/20 1307   04/10/20 0300  metroNIDAZOLE (FLAGYL) IVPB 500 mg        500 mg 100 mL/hr over 60 Minutes Intravenous Every 8 hours 04/10/20 0252     04/10/20 0130  ceFEPIme (MAXIPIME) 2 g in sodium chloride 0.9 % 100 mL IVPB        2 g 200 mL/hr over 30 Minutes Intravenous STAT 04/10/20 0116 04/10/20 0246   04/10/20 0130  vancomycin (VANCOREADY) IVPB 2000 mg/400 mL        2,000 mg 200 mL/hr over 120 Minutes Intravenous STAT 04/10/20 0116 04/10/20 0628         Objective: Vitals:   04/12/20 0441 04/12/20 0721 04/12/20 1016 04/12/20 1524  BP: 139/72 131/69  128/69  Pulse: 60 62  75  Resp: 14 16  17   Temp: 98.6 F (37 C) 98.4 F (36.9 C)  98.7 F (37.1 C)  TempSrc: Oral Oral  Oral  SpO2: 98% 99% 98% 91%  Weight:      Height:        Intake/Output Summary (Last 24 hours) at 04/12/2020 1712 Last data filed at 04/12/2020 1703 Gross per 24 hour  Intake 2074.85 ml  Output --  Net 2074.85 ml   Filed Weights   04/10/20 0100 04/10/20 0414  Weight: 114 kg 111.1 kg    Examination:  General exam: calm, NAD, aaox3, hard of hearing Respiratory system: Clear to auscultation. Respiratory effort normal. Cardiovascular system: S1 & S2 heard, RRR.  Precordial murmur, report chronic, no pedal edema. Gastrointestinal system: Abdomen is nondistended, soft and nontender. . Normal bowel sounds heard. Central nervous system: Alert and oriented. No focal neurological deficits. Extremities: Right hand postop  changes, dressing intact Skin: Right hand postop changes, scattered abrasions in arms and legs Psychiatry: Judgement and insight appear normal. Mood & affect appropriate.     Data Reviewed: I have personally reviewed following labs and imaging studies  CBC: Recent Labs  Lab 04/09/20 2016 04/10/20 0534 04/10/20 1642 04/12/20 0518  WBC 13.4* 11.7* 10.4 8.9  NEUTROABS 10.1*  --  7.4  --   HGB 13.7 11.4* 11.0* 11.2*  HCT 42.7 35.3* 34.3* 35.3*  MCV 94.1 91.7 92.2 94.4  PLT 385 292 279 025    Basic Metabolic Panel: Recent Labs  Lab 04/09/20 2016 04/10/20 0534 04/11/20 0210 04/12/20 0518  NA 133* 134* 137 138  K 4.5 4.7 5.0 4.3  CL 101 101 102 103  CO2 23 21* 26 28  GLUCOSE 110* 116* 131* 126*  BUN 13 17 20 15   CREATININE 1.21 1.51* 1.46* 1.30*  CALCIUM 9.8 9.1 9.6 9.6  MG  --   --  2.2  --     GFR: Estimated Creatinine Clearance: 54.8 mL/min (A) (by C-G formula based on SCr of 1.3 mg/dL (H)).  Liver Function Tests: Recent Labs  Lab 04/09/20 2016  AST 31  ALT 39  ALKPHOS 87  BILITOT 0.7  PROT 7.3  ALBUMIN 2.9*    CBG: Recent Labs  Lab 04/11/20 2000 04/12/20 0005 04/12/20 0723 04/12/20 1112 04/12/20 1622  GLUCAP 124* 130* 125* 155* 170*     Recent Results (from the past 240 hour(s))  Culture, blood (routine x 2)     Status: None (Preliminary result)   Collection Time: 04/09/20  8:00 PM   Specimen: BLOOD LEFT HAND  Result Value Ref Range Status   Specimen Description BLOOD LEFT HAND  Final   Special Requests AEROBIC BOTTLE ONLY Blood Culture adequate volume  Final   Culture   Final    NO GROWTH 1 DAY Performed at [facility], [place] [address] [place], [place] [address]    Report Status PENDING  Incomplete  Culture, blood (routine x 2)     Status: None (Preliminary result)   Collection Time: 04/09/20  8:13 PM   Specimen: BLOOD LEFT ARM  Result Value Ref Range Status   Specimen Description BLOOD LEFT ARM  Final   Special Requests    Final    BOTTLES DRAWN AEROBIC AND ANAEROBIC Blood Culture adequate volume   Culture   Final    NO GROWTH 2 DAYS Performed at [facility], [place] [address] [place], [place] [address]    Report Status PENDING  Incomplete  Resp Panel by RT-PCR (Flu A&B, Covid) Nasopharyngeal Swab     Status: None   Collection Time: 04/10/20  2:00 AM   Specimen: Nasopharyngeal Swab; Nasopharyngeal(NP) swabs in vial transport medium  Result Value Ref Range Status   SARS Coronavirus 2 by RT PCR NEGATIVE NEGATIVE Final    Comment: (NOTE) SARS-CoV-2 target nucleic acids are NOT DETECTED.  The SARS-CoV-2 RNA is generally detectable in upper respiratory specimens during the acute phase of infection. The lowest concentration of SARS-CoV-2 viral copies this assay can detect is 138 copies/mL. A negative result does not preclude SARS-Cov-2 infection and should not be used as the sole basis for treatment or other patient management decisions. A negative result may occur with  improper specimen collection/handling, submission of specimen other than nasopharyngeal swab, presence of viral mutation(s) within the areas targeted by this assay, and inadequate number of viral copies(<138 copies/mL). A negative result must be combined with clinical observations, patient history, and epidemiological information. The expected result is Negative.  Fact Sheet for Patients:  EntrepreneurPulse.com.au  Fact Sheet for Healthcare Providers:  IncredibleEmployment.be  This test is no t yet approved or cleared by the [place] FDA and  has been authorized for detection and/or diagnosis of SARS-CoV-2 by FDA under an Emergency Use Authorization (EUA). This  EUA will remain  in effect (meaning this test can be used) for the duration of the COVID-19 declaration under Section 564(b)(1) of the Act, 21 U.S.C.section 360bbb-3(b)(1), unless the authorization is terminated  or revoked sooner.        Influenza A by PCR NEGATIVE NEGATIVE Final   Influenza B by PCR NEGATIVE NEGATIVE Final    Comment: (NOTE) The Xpert Xpress SARS-CoV-2/FLU/RSV plus assay is intended as an aid in the diagnosis of influenza from Nasopharyngeal swab specimens and should not be used as a sole basis for treatment. Nasal washings and aspirates are unacceptable for Xpert Xpress SARS-CoV-2/FLU/RSV testing.  Fact Sheet for Patients: EntrepreneurPulse.com.au  Fact Sheet for Healthcare Providers: IncredibleEmployment.be  This test is not yet approved or cleared by the [place] FDA and has been authorized for detection and/or diagnosis of SARS-CoV-2 by FDA under an Emergency Use Authorization (EUA). This EUA will remain in effect (meaning this test can be used) for the duration of the COVID-19 declaration under Section 564(b)(1) of the Act, 21 U.S.C. section 360bbb-3(b)(1), unless the authorization is terminated or revoked.  Performed at [facility], [place] [address] [place], [place] [address]   MRSA PCR Screening     Status: None   Collection Time: 04/10/20 10:05 AM   Specimen: Nasopharyngeal  Result Value Ref Range Status   MRSA by PCR NEGATIVE NEGATIVE Final    Comment:        The GeneXpert MRSA Assay (FDA approved for NASAL specimens only), is one component of a comprehensive MRSA colonization surveillance program. It is not intended to diagnose MRSA infection nor to guide or monitor treatment for MRSA infections. Performed at [facility], [place] [address], [place], [place] [address]   Aerobic/Anaerobic Culture w Gram Stain (surgical/deep wound)     Status: None (Preliminary result)   Collection Time: 04/10/20  1:03 PM   Specimen: Joint, Finger  Result Value Ref Range Status   Specimen Description FINGER  Final   Special Requests RT RING FINGER  Final   Gram Stain   Final    RARE WBC PRESENT,BOTH PMN AND MONONUCLEAR NO ORGANISMS  SEEN Performed at [facility], [address] [place], [place] [address]    Culture   Final    RARE STAPHYLOCOCCUS AUREUS NO ANAEROBES ISOLATED; CULTURE IN PROGRESS FOR 5 DAYS    Report Status PENDING  Incomplete  Aerobic/Anaerobic Culture w Gram Stain (surgical/deep wound)     Status: None (Preliminary result)   Collection Time: 04/10/20  1:08 PM   Specimen: Soft Tissue, Other  Result Value Ref Range Status   Specimen Description TISSUE  Final   Special Requests   Final    SOFT TISSUE Performed at [facility], [place] [address], [place], [place] [address]    Gram Stain PENDING  Incomplete   Culture   Final    FEW STAPHYLOCOCCUS AUREUS NO ANAEROBES ISOLATED; CULTURE IN PROGRESS FOR 5 DAYS    Report Status PENDING  Incomplete   Organism ID, Bacteria STAPHYLOCOCCUS AUREUS  Final      Susceptibility   Staphylococcus aureus - MIC*    CIPROFLOXACIN <=0.5 SENSITIVE Sensitive     ERYTHROMYCIN <=0.25 SENSITIVE Sensitive     GENTAMICIN <=0.5 SENSITIVE Sensitive     OXACILLIN <=0.25 SENSITIVE Sensitive     TETRACYCLINE <=1 SENSITIVE Sensitive     VANCOMYCIN 1 SENSITIVE Sensitive     TRIMETH/SULFA <=10 SENSITIVE Sensitive     CLINDAMYCIN <=  0.25 SENSITIVE Sensitive     RIFAMPIN <=0.5 SENSITIVE Sensitive     Inducible Clindamycin NEGATIVE Sensitive     * FEW STAPHYLOCOCCUS AUREUS         Radiology Studies: No results found.      Scheduled Meds: . apixaban  5 mg Oral BID  . gabapentin  600 mg Oral BID  . insulin aspart  0-9 Units Subcutaneous Q4H  . metoprolol tartrate  12.5 mg Oral BID  . Netarsudil-Latanoprost  1 drop Right Eye QHS  . polyethylene glycol  17 g Oral Daily  . senna-docusate  1 tablet Oral BID  . traZODone  150 mg Oral QHS   Continuous Infusions: .  ceFAZolin (ANCEF) IV 2 g (04/12/20 1541)  . metronidazole 500 mg (04/12/20 1222)  . vancomycin 1,250 mg (04/12/20 1634)     LOS: 2 days   Time spent: 42mins Greater than 50% of this time  was spent in counseling, explanation of diagnosis, planning of further management, and coordination of care.   Voice Recognition Viviann Spare dictation system was used to create this note, attempts have been made to correct errors. Please contact the author with questions and/or clarifications.   Florencia Reasons, MD PhD FACP [facility]  Available via Epic secure chat 7am-7pm for nonurgent issues Please page for urgent issues To page the attending provider between 7A-7P or the covering provider during after hours 7P-7A, please log into the web site [email or web address] and access using universal [place] password for that web site. If you do not have the password, please call the hospital operator.    04/12/2020, 5:12 PM

## 2020-04-12 NOTE — Progress Notes (Signed)
Physical Therapy Treatment Patient Details Name: [name] MRN: [id number] DOB: 1940-11-20 Today's Date: 04/12/2020    History of Present Illness Pt adm 4/1 with acute osteomyelitis of rt ring finger. Underwent amputation of rt ring finger on 4/2. PMH - DM, peripheral neuropathy, obesity, PAF.    PT Comments    Pt progressing towards goals. Increased unsteadiness during gait requiring min A for steadying. Pt reporting BLE felt weak during gait. Pt continues to be very adamant about returning home. Educated about using platform RW vs WC at home for increased safety. Pt reports his wife is getting a WC for him for home. Current recommendations appropriate. Will continue to follow acutely.     Follow Up Recommendations  Home health PT;Supervision for mobility/OOB     Equipment Recommendations  Other (comment) (rt platform for walker)    Recommendations for Other Services       Precautions / Restrictions Precautions Precautions: Fall Restrictions Weight Bearing Restrictions: No    Mobility  Bed Mobility Overal bed mobility: Needs Assistance Bed Mobility: Supine to Sit     Supine to sit: Mod assist     General bed mobility comments: Assist to elevate trunk into sitting    Transfers Overall transfer level: Needs assistance Equipment used: Rolling walker (2 wheeled);Right platform walker Transfers: Sit to/from Stand Sit to Stand: Min assist         General transfer comment: required assist for lift assist and steadying.  Verbal cues for hand placement  Ambulation/Gait Ambulation/Gait assistance: Min assist Gait Distance (Feet): 20 Feet Assistive device: Rolling walker (2 wheeled);Right platform walker Gait Pattern/deviations: Step-to pattern;Decreased step length - right;Decreased step length - left Gait velocity: Decreased   General Gait Details: Pt with increased weakness as gait progressed. Became shaky with gait in room, so further mobility deferred. Cues  for safe use of platform RW.   Stairs             Wheelchair Mobility    Modified Rankin (Stroke Patients Only)       Balance Overall balance assessment: Needs assistance;History of Falls Sitting-balance support: No upper extremity supported;Feet supported Sitting balance-Leahy Scale: Fair     Standing balance support: Bilateral upper extremity supported;During functional activity Standing balance-Leahy Scale: Poor Standing balance comment: Reliant on UE and external support                            Cognition Arousal/Alertness: Awake/alert Behavior During Therapy: WFL for tasks assessed/performed Overall Cognitive Status: No family/caregiver present to determine baseline cognitive functioning                                 General Comments: History of decr safety awareness. Pt laying in urine and had not called anyone to help him clean up.      Exercises      General Comments        Pertinent Vitals/Pain Pain Assessment: Faces Faces Pain Scale: Hurts little more Pain Location: RLE Pain Descriptors / Indicators: Grimacing;Guarding Pain Intervention(s): Limited activity within patient's tolerance;Monitored during session;Repositioned    Home Living                      Prior Function            PT Goals (current goals can now be found in the care plan section) Acute Rehab  PT Goals Patient Stated Goal: return home PT Goal Formulation: With patient/family Time For Goal Achievement: 04/18/20 Potential to Achieve Goals: Fair Progress towards PT goals: Progressing toward goals    Frequency    Min 3X/week      PT Plan Current plan remains appropriate    Co-evaluation              AM-PAC PT "6 Clicks" Mobility   Outcome Measure  Help needed turning from your back to your side while in a flat bed without using bedrails?: A Little Help needed moving from lying on your back to sitting on the side of a  flat bed without using bedrails?: A Lot Help needed moving to and from a bed to a chair (including a wheelchair)?: A Little Help needed standing up from a chair using your arms (e.g., wheelchair or bedside chair)?: A Little Help needed to walk in hospital room?: A Little Help needed climbing 3-5 steps with a railing? : A Lot 6 Click Score: 16    End of Session Equipment Utilized During Treatment: Gait belt Activity Tolerance: Patient tolerated treatment well Patient left: with call bell/phone within reach;in chair;with chair alarm set Nurse Communication: Mobility status PT Visit Diagnosis: Unsteadiness on feet (R26.81);Other abnormalities of gait and mobility (R26.89)     Time: 8022-3361 PT Time Calculation (min) (ACUTE ONLY): 19 min  Charges:  $Gait Training: 8-22 mins                     [name], PT, DPT  Acute Rehabilitation Services  Pager: [phone] Office: [phone]    [name] 04/12/2020, 4:28 PM

## 2020-04-12 NOTE — Progress Notes (Addendum)
Pharmacy Antibiotic Note  MALACHI KINZLER is a 80 y.o. male admitted on 04/09/2020 with R Ring finger infection with confirmed osteomyelitis on imaging.  S/p right ring finger amputation on 04/10/20.  Pharmacy has been consulted for vancomycin dosing.  Intra-op cultures are growing MSSA; preliminary result.  Renal function improving.  Afebrile, WBC WNL.  Plan: Cancel vanc random level Schedule vanc 1250mg  IV Q24H for AUC 508 using SCr 1.3 Flagyl 500mg  IV Q8H per MD Monitor renal fxn, clinical progress, vanc levels as indicated Discussed with MD, narrow cefepime to Ancef and will narrow further once cultures are finalized   Height: 5\' 9"  (175.3 cm) Weight: 111.1 kg (245 lb) IBW/kg (Calculated) : 70.7  Temp (24hrs), Avg:98.3 F (36.8 C), Min:98 F (36.7 C), Max:98.6 F (37 C)  Recent Labs  Lab 04/09/20 2016 04/10/20 0157 04/10/20 0534 04/10/20 1642 04/11/20 0210 04/12/20 0518  WBC 13.4*  --  11.7* 10.4  --  8.9  CREATININE 1.21  --  1.51*  --  1.46* 1.30*  LATICACIDVEN 2.5* 2.4*  --   --  1.1  --     Estimated Creatinine Clearance: 54.8 mL/min (A) (by C-G formula based on SCr of 1.3 mg/dL (H)).    Allergies  Allergen Reactions  . Ambien [Zolpidem Tartrate]     "lost his mind"  . Amlodipine Besylate Other (See Comments)    Other reaction(s): edema edema   . Aspirin     Bleeding ulcer  . Dorzolamide Hcl-Timolol Mal     Other reaction(s): Other (See Comments), red and burning   Bactrim PTA Cefepime 4/2 >>  Vanc 4/2 >>  Flagyl 4/2 >>  4/1 BCx - NGTD 4/2 MRSA PCR - negative 4/2 OR soft tissue cx - MSSA (preliminary) 4/2 R finger joint cx - MSSA (preliminary)  Geniyah Eischeid D. Mina Marble, PharmD, BCPS, [place] 04/12/2020, 1:04 PM

## 2020-04-12 NOTE — Plan of Care (Signed)
  Problem: Health Behavior/Discharge Planning: Goal: Ability to manage health-related needs will improve Outcome: Progressing   Problem: Clinical Measurements: Goal: Ability to maintain clinical measurements within normal limits will improve Outcome: Progressing   

## 2020-04-12 NOTE — TOC Initial Note (Signed)
Transition of Care [facility]) - Initial/Assessment Note    Patient Details  Name: [name] MRN: [id number] Date of Birth: 06-01-1940  Transition of Care [facility]) CM/SW Contact:    Sharin Mons, RN Phone Number: 04/12/2020, 1:16 PM  Clinical Narrative:                 Admitted with acute osteomyelitis of R ring finger.       - s/p amputation of rt ring finger on 4/2 From home with wife. PTA wife states pt was recently released by [facility] and would like to used them again for home health services if needed. Pt  already has rolling walker, rolator...states referral for w/c already in process prior to admit.  NCM shared PT's evaluation/ recommendation for HHPT. Referral made with [facility] ....acceptance pending. Referral made with [employer] for R platform rolling walker. Equipment will be delivered to bedside prior to d/c.  TOC team will continue to monitor and assist with TOC needs.     Expected Discharge Plan: [place] Barriers to Discharge: Continued Medical Work up   Patient Goals and CMS Choice     Choice offered to / list presented to : Spouse  Expected Discharge Plan and Services Expected Discharge Plan: [place]   Discharge Planning Services: CM Consult   Living arrangements for the past 2 months: Single Family Home                 DME Arranged: Gilford Rile platform DME Agency: [employer] Date DME Agency Contacted: 04/12/20 Time DME Agency Contacted: [phone] Representative spoke with at DME Agency: Freda Munro HH Arranged: PT   Date [place]: 04/12/20 Time [place]: [id number] Representative spoke with at [place]: [place] Arrangements/Services Living arrangements for the past 2 months: [place] with:: Spouse Patient language and need for interpreter reviewed:: Yes Do you feel safe going back to the place where you live?: Yes      Need for Family Participation in  Patient Care: Yes (Comment) Care giver support system in place?: Yes (comment) Current home services: DME Criminal Activity/Legal Involvement Pertinent to Current Situation/Hospitalization: No - Comment as needed  Activities of Daily Living Home Assistive Devices/Equipment: Walker (specify type),Cane (specify quad or straight) ADL Screening (condition at time of admission) Patient's cognitive ability adequate to safely complete daily activities?: Yes Is the patient deaf or have difficulty hearing?: Yes Does the patient have difficulty seeing, even when wearing glasses/contacts?: No Does the patient have difficulty concentrating, remembering, or making decisions?: No Patient able to express need for assistance with ADLs?: Yes Does the patient have difficulty dressing or bathing?: Yes Independently performs ADLs?: Yes (appropriate for developmental age) Does the patient have difficulty walking or climbing stairs?: Yes Weakness of Legs: Both Weakness of Arms/Hands: Both  Permission Sought/Granted   Permission granted to share information with : Yes, Verbal Permission Granted  Share Information with NAME: DEMANI MCBRIEN (Spouse) [phone]           Emotional Assessment Appearance:: Appears stated age Attitude/Demeanor/Rapport: Gracious Affect (typically observed): Accepting Orientation: : Oriented to Self,Oriented to Place,Oriented to  Time,Oriented to Situation Alcohol / Substance Use: Not Applicable Psych Involvement: No (comment)  Admission diagnosis:  Open wound of right forearm, initial encounter [S51.801A] Infection of right hand [L08.9] Osteomyelitis of right hand, unspecified type ([place]) [M86.9] Open wound of right ring finger [S61.204A] Patient Active Problem List  Diagnosis Date Noted  . Infection of right hand 04/10/2020  . Acute osteomyelitis of hand including fingers, right ([place]) 04/10/2020  . PAF (paroxysmal atrial fibrillation) ([place]) 04/10/2020  . AKI (acute  kidney injury) ([place]) 01/06/2020  . Weakness 01/06/2020  . Dehydration 01/06/2020  . Orthostatic hypotension 01/06/2020  . Diabetes mellitus type 2 in obese ([place]) 01/06/2020  . Glaucoma 01/06/2020  . Tachycardia 07/10/2019  . DOE (dyspnea on exertion) 07/10/2019   PCP:  Lavone Orn, MD Pharmacy:   [facility] [place], [place] - [place] [address] Phone: [phone] Fax: [phone]     Social Determinants of Health (SDOH) Interventions    Readmission Risk Interventions No flowsheet data found.

## 2020-04-12 NOTE — Plan of Care (Signed)
  Problem: Pain Managment: Goal: General experience of comfort will improve Outcome: Progressing   Problem: Skin Integrity: Goal: Risk for impaired skin integrity will decrease Outcome: Progressing   

## 2020-04-13 DIAGNOSIS — M86141 Other acute osteomyelitis, right hand: Secondary | ICD-10-CM | POA: Diagnosis not present

## 2020-04-13 LAB — GLUCOSE, CAPILLARY
Glucose-Capillary: 119 mg/dL — ABNORMAL HIGH (ref 70–99)
Glucose-Capillary: 147 mg/dL — ABNORMAL HIGH (ref 70–99)
Glucose-Capillary: 150 mg/dL — ABNORMAL HIGH (ref 70–99)
Glucose-Capillary: 150 mg/dL — ABNORMAL HIGH (ref 70–99)
Glucose-Capillary: 152 mg/dL — ABNORMAL HIGH (ref 70–99)
Glucose-Capillary: 152 mg/dL — ABNORMAL HIGH (ref 70–99)
Glucose-Capillary: 171 mg/dL — ABNORMAL HIGH (ref 70–99)

## 2020-04-13 LAB — BASIC METABOLIC PANEL
Anion gap: 7 (ref 5–15)
BUN: 17 mg/dL (ref 8–23)
CO2: 27 mmol/L (ref 22–32)
Calcium: 9.4 mg/dL (ref 8.9–10.3)
Chloride: 106 mmol/L (ref 98–111)
Creatinine, Ser: 1.32 mg/dL — ABNORMAL HIGH (ref 0.61–1.24)
GFR, Estimated: 54 mL/min — ABNORMAL LOW (ref >60)
Glucose, Bld: 141 mg/dL — ABNORMAL HIGH (ref 70–99)
Potassium: 4.2 mmol/L (ref 3.5–5.1)
Sodium: 140 mmol/L (ref 135–145)

## 2020-04-13 NOTE — Progress Notes (Addendum)
PROGRESS NOTE    [name]  EXB:[id number] DOB: [birth year] DOA: 04/09/2020 PCP: Lavone Orn, MD   Chief Complain: Finger injury  Brief Narrative: Patient is a 80 year old male with history of diabetes type 2,  peripheral neuropathy, OSA, obesity, paroxysmal A. fib on Eliquis who presented with finger injury sustained about 3 weeks ago before admission.  He sustained a burn before he noticed blistering on his finger.  Orthopedics consulted and he underwent right finger amputation through the level of metacarpal, ray amputation of right ring finger with local neurectomies and primary closure on 4/1.  Orthopedics closely following.  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  Surgical culture showed MSSA, will  continue  cefazolin.  WBC normalized, lactic acid is resolved.  Vancomycin and Flagyl discontinued.  Will follow orthopedics recommendation. As per orthopedics, he may need require I&D based on the response to IV antibiotics and wound care. Sepsis ruled out.  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  Kidney function around baseline.  IV fluids discontinued.  Lasix and Micardis on hold.  Paroxysmal A. fib: Currently in normal sinus rhythm.  Started on Lopressor here.  On Eliquis for anticoagulation  Non-insulin-dependent diabetes type 2: Metformin at home.  On sliding scale insulin here.  Peripheral neuropathy: Continue Neurontin  Hypertension: On Norvasc/Micardis/Lasix at home.  This medication has been held due to potential allergy from Narvox and also for AKI.  Started on low-dose  Lopressor.  Currently blood pressure stable.  Morbid obesity: BMI 36.1.  History of OSA: Does not use CPAP at home.  Debility/deconditioning: Has gait problems.  PT/OT recommend home health on discharge.           DVT prophylaxis:Eliquis Code Status: Full Family Communication: None at bedside Status is: Inpatient  Remains inpatient appropriate because:Inpatient level of care appropriate due to severity of illness   Dispo: The patient is from: Home              Anticipated d/c is to: Home              Patient currently is not medically stable to d/c.   Difficult to place patient No     Consultants:Orthopedics   Procedures:As above  Antimicrobials:  Anti-infectives (From admission, onward)   Start     Dose/Rate Route Frequency Ordered Stop   04/12/20 1800  vancomycin (VANCOREADY) IVPB 1250 mg/250 mL        1,250 mg 166.7 mL/hr over 90 Minutes Intravenous Every 24 hours 04/12/20 1307     04/12/20 1400  ceFAZolin (ANCEF) IVPB 2g/100 mL premix        2 g 200 mL/hr over 30 Minutes Intravenous Every 8 hours 04/12/20 1306     04/11/20 1600  vancomycin (VANCOREADY) IVPB 1250 mg/250 mL        1,250 mg 166.7 mL/hr over 90 Minutes Intravenous  Once 04/10/20 1044 04/11/20 1915   04/11/20 0400  vancomycin (VANCOREADY) IVPB 1250 mg/250 mL  Status:  Discontinued        1,250 mg 166.7 mL/hr over 90 Minutes Intravenous Every 24 hours 04/10/20 0251 04/10/20 1044  04/10/20 1500  ceFEPIme (MAXIPIME) 2 g in sodium chloride 0.9 % 100 mL IVPB  Status:  Discontinued        2 g 200 mL/hr over 30 Minutes Intravenous Every 12 hours 04/10/20 1047 04/12/20 1306   04/10/20 1100  ceFEPIme (MAXIPIME) 2 g in sodium chloride 0.9 % 100 mL IVPB  Status:  Discontinued        2 g 200 mL/hr over 30 Minutes Intravenous Every 8 hours 04/10/20 0251 04/10/20 1047   04/10/20 1044  vancomycin variable dose per unstable renal function (pharmacist dosing)  Status:  Discontinued         Does not apply See  admin instructions 04/10/20 1044 04/12/20 1307   04/10/20 0300  metroNIDAZOLE (FLAGYL) IVPB 500 mg        500 mg 100 mL/hr over 60 Minutes Intravenous Every 8 hours 04/10/20 0252     04/10/20 0130  ceFEPIme (MAXIPIME) 2 g in sodium chloride 0.9 % 100 mL IVPB        2 g 200 mL/hr over 30 Minutes Intravenous STAT 04/10/20 0116 04/10/20 0246   04/10/20 0130  vancomycin (VANCOREADY) IVPB 2000 mg/400 mL        2,000 mg 200 mL/hr over 120 Minutes Intravenous STAT 04/10/20 0116 04/10/20 0628      Subjective:  Patient seen and examined at the bedside this morning.  Wife at the bedside to.  Very comfortable, eating his lunch.  Denies any complaints.  Pain well controlled  Objective: Vitals:   04/12/20 1524 04/13/20 0100 04/13/20 0400 04/13/20 0751  BP: 128/69 (!) 131/58 (!) 130/58 135/60  Pulse: 75 63 66 60  Resp: 17 16 16 16   Temp: 98.7 F (37.1 C) 98.6 F (37 C) 98.6 F (37 C) 98.5 F (36.9 C)  TempSrc: Oral Oral Oral Oral  SpO2: 91% 93% 93% 94%  Weight:      Height:        Intake/Output Summary (Last 24 hours) at 04/13/2020 1147 Last data filed at 04/13/2020 0301 Gross per 24 hour  Intake 2016.78 ml  Output --  Net 2016.78 ml   Filed Weights   04/10/20 0100 04/10/20 0414  Weight: 114 kg 111.1 kg    Examination:  General exam: Pleasant elderly male, obese HEENT:PERRL,Oral mucosa moist, Ear/Nose normal on gross exam Respiratory system: Bilateral equal air entry, normal vesicular breath sounds, no wheezes or crackles  Cardiovascular system: S1 & S2 heard, RRR. No JVD, murmurs, rubs, gallops or clicks. No pedal edema. Gastrointestinal system: Abdomen is nondistended, soft and nontender. No organomegaly or masses felt. Normal bowel sounds heard. Central nervous system: Alert and oriented. No focal neurological deficits. Extremities: No edema, no clubbing ,no cyanosis Dressing on the right hand, absence of left index finger  skin: No rashes, lesions or ulcers,no icterus ,no  pallor    Data Reviewed: I have personally reviewed following labs and imaging studies  CBC: Recent Labs  Lab 04/09/20 2016 04/10/20 0534 04/10/20 1642 04/12/20 0518  WBC 13.4* 11.7* 10.4 8.9  NEUTROABS 10.1*  --  7.4  --   HGB 13.7 11.4* 11.0* 11.2*  HCT 42.7 35.3* 34.3* 35.3*  MCV 94.1 91.7 92.2 94.4  PLT 385 292 279 917   Basic Metabolic Panel: Recent Labs  Lab 04/09/20 2016 04/10/20 0534 04/11/20 0210 04/12/20 0518 04/13/20 0244  NA 133* 134* 137 138 140  K 4.5 4.7 5.0 4.3 4.2  CL 101 101 102 103 106  CO2 23 21* 26 28  27  GLUCOSE 110* 116* 131* 126* 141*  BUN 13 17 20 15 17   CREATININE 1.21 1.51* 1.46* 1.30* 1.32*  CALCIUM 9.8 9.1 9.6 9.6 9.4  MG  --   --  2.2  --   --    GFR: Estimated Creatinine Clearance: 53.9 mL/min (A) (by C-G formula based on SCr of 1.32 mg/dL (H)). Liver Function Tests: Recent Labs  Lab 04/09/20 2016  AST 31  ALT 39  ALKPHOS 87  BILITOT 0.7  PROT 7.3  ALBUMIN 2.9*   No results for input(s): LIPASE, AMYLASE in the last 168 hours. No results for input(s): AMMONIA in the last 168 hours. Coagulation Profile: Recent Labs  Lab 04/10/20 0534  INR 1.3*   Cardiac Enzymes: No results for input(s): CKTOTAL, CKMB, CKMBINDEX, TROPONINI in the last 168 hours. BNP (last 3 results) No results for input(s): PROBNP in the last 8760 hours. HbA1C: No results for input(s): HGBA1C in the last 72 hours. CBG: Recent Labs  Lab 04/13/20 0013 04/13/20 0431 04/13/20 0459 04/13/20 0818 04/13/20 1108  GLUCAP 119* 150* 147* 150* 152*   Lipid Profile: No results for input(s): CHOL, HDL, LDLCALC, TRIG, CHOLHDL, LDLDIRECT in the last 72 hours. Thyroid Function Tests: No results for input(s): TSH, T4TOTAL, FREET4, T3FREE, THYROIDAB in the last 72 hours. Anemia Panel: No results for input(s): VITAMINB12, FOLATE, FERRITIN, TIBC, IRON, RETICCTPCT in the last 72 hours. Sepsis Labs: Recent Labs  Lab 04/09/20 2016 04/10/20 0157 04/11/20 0210   LATICACIDVEN 2.5* 2.4* 1.1    Recent Results (from the past 240 hour(s))  Culture, blood (routine x 2)     Status: None (Preliminary result)   Collection Time: 04/09/20  8:00 PM   Specimen: BLOOD LEFT HAND  Result Value Ref Range Status   Specimen Description BLOOD LEFT HAND  Final   Special Requests AEROBIC BOTTLE ONLY Blood Culture adequate volume  Final   Culture   Final    NO GROWTH 3 DAYS Performed at [facility], [place] [address], [place], [place] [address]    Report Status PENDING  Incomplete  Culture, blood (routine x 2)     Status: None (Preliminary result)   Collection Time: 04/09/20  8:13 PM   Specimen: BLOOD LEFT ARM  Result Value Ref Range Status   Specimen Description BLOOD LEFT ARM  Final   Special Requests   Final    BOTTLES DRAWN AEROBIC AND ANAEROBIC Blood Culture adequate volume   Culture   Final    NO GROWTH 4 DAYS Performed at [facility], [place] [address] [place], [place] [address]    Report Status PENDING  Incomplete  Resp Panel by RT-PCR (Flu A&B, Covid) Nasopharyngeal Swab     Status: None   Collection Time: 04/10/20  2:00 AM   Specimen: Nasopharyngeal Swab; Nasopharyngeal(NP) swabs in vial transport medium  Result Value Ref Range Status   SARS Coronavirus 2 by RT PCR NEGATIVE NEGATIVE Final    Comment: (NOTE) SARS-CoV-2 target nucleic acids are NOT DETECTED.  The SARS-CoV-2 RNA is generally detectable in upper respiratory specimens during the acute phase of infection. The lowest concentration of SARS-CoV-2 viral copies this assay can detect is 138 copies/mL. A negative result does not preclude SARS-Cov-2 infection and should not be used as the sole basis for treatment or other patient management decisions. A negative result may occur with  improper specimen collection/handling, submission of specimen other than nasopharyngeal swab, presence of viral mutation(s) within the areas targeted by  this assay, and inadequate number of  viral copies(<138 copies/mL). A negative result must be combined with clinical observations, patient history, and epidemiological information. The expected result is Negative.  Fact Sheet for Patients:  EntrepreneurPulse.com.au  Fact Sheet for Healthcare Providers:  IncredibleEmployment.be  This test is no t yet approved or cleared by the [place] FDA and  has been authorized for detection and/or diagnosis of SARS-CoV-2 by FDA under an Emergency Use Authorization (EUA). This EUA will remain  in effect (meaning this test can be used) for the duration of the COVID-19 declaration under Section 564(b)(1) of the Act, 21 U.S.C.section 360bbb-3(b)(1), unless the authorization is terminated  or revoked sooner.       Influenza A by PCR NEGATIVE NEGATIVE Final   Influenza B by PCR NEGATIVE NEGATIVE Final    Comment: (NOTE) The Xpert Xpress SARS-CoV-2/FLU/RSV plus assay is intended as an aid in the diagnosis of influenza from Nasopharyngeal swab specimens and should not be used as a sole basis for treatment. Nasal washings and aspirates are unacceptable for Xpert Xpress SARS-CoV-2/FLU/RSV testing.  Fact Sheet for Patients: EntrepreneurPulse.com.au  Fact Sheet for Healthcare Providers: IncredibleEmployment.be  This test is not yet approved or cleared by the [place] FDA and has been authorized for detection and/or diagnosis of SARS-CoV-2 by FDA under an Emergency Use Authorization (EUA). This EUA will remain in effect (meaning this test can be used) for the duration of the COVID-19 declaration under Section 564(b)(1) of the Act, 21 U.S.C. section 360bbb-3(b)(1), unless the authorization is terminated or revoked.  Performed at [facility], [place] [address] [place], [place] [address]   MRSA PCR Screening     Status: None   Collection Time: 04/10/20 10:05 AM   Specimen: Nasopharyngeal  Result  Value Ref Range Status   MRSA by PCR NEGATIVE NEGATIVE Final    Comment:        The GeneXpert MRSA Assay (FDA approved for NASAL specimens only), is one component of a comprehensive MRSA colonization surveillance program. It is not intended to diagnose MRSA infection nor to guide or monitor treatment for MRSA infections. Performed at [facility], [place] [address], [place], [place] [address]   Aerobic/Anaerobic Culture w Gram Stain (surgical/deep wound)     Status: None (Preliminary result)   Collection Time: 04/10/20  1:03 PM   Specimen: Joint, Finger  Result Value Ref Range Status   Specimen Description FINGER  Final   Special Requests RT RING FINGER  Final   Gram Stain   Final    RARE WBC PRESENT,BOTH PMN AND MONONUCLEAR NO ORGANISMS SEEN Performed at [facility], [address] [place], [place] [address]    Culture   Final    RARE STAPHYLOCOCCUS AUREUS SUSCEPTIBILITIES TO FOLLOW NO ANAEROBES ISOLATED; CULTURE IN PROGRESS FOR 5 DAYS    Report Status PENDING  Incomplete  Aerobic/Anaerobic Culture w Gram Stain (surgical/deep wound)     Status: None (Preliminary result)   Collection Time: 04/10/20  1:08 PM   Specimen: Soft Tissue, Other  Result Value Ref Range Status   Specimen Description TISSUE  Final   Special Requests SOFT TISSUE  Final   Gram Stain   Final    FEW WBC PRESENT,BOTH PMN AND MONONUCLEAR NO ORGANISMS SEEN    Culture   Final    FEW STAPHYLOCOCCUS AUREUS RARE DIPHTHEROIDS(CORYNEBACTERIUM SPECIES) Standardized susceptibility testing for this organism is not available. NO ANAEROBES ISOLATED; CULTURE IN PROGRESS FOR 5 DAYS CRITICAL  RESULT CALLED TO, READ BACK BY AND VERIFIED WITH: RN SAM M. [phone] FCP Performed at [facility], [place] [address], [place], [place] [address]    Report Status PENDING  Incomplete   Organism ID, Bacteria STAPHYLOCOCCUS AUREUS  Final      Susceptibility   Staphylococcus aureus - MIC*    CIPROFLOXACIN  <=0.5 SENSITIVE Sensitive     ERYTHROMYCIN <=0.25 SENSITIVE Sensitive     GENTAMICIN <=0.5 SENSITIVE Sensitive     OXACILLIN <=0.25 SENSITIVE Sensitive     TETRACYCLINE <=1 SENSITIVE Sensitive     VANCOMYCIN 1 SENSITIVE Sensitive     TRIMETH/SULFA <=10 SENSITIVE Sensitive     CLINDAMYCIN <=0.25 SENSITIVE Sensitive     RIFAMPIN <=0.5 SENSITIVE Sensitive     Inducible Clindamycin NEGATIVE Sensitive     * FEW STAPHYLOCOCCUS AUREUS         Radiology Studies: No results found.      Scheduled Meds: . apixaban  5 mg Oral BID  . gabapentin  600 mg Oral BID  . insulin aspart  0-9 Units Subcutaneous Q4H  . metoprolol tartrate  12.5 mg Oral BID  . Netarsudil-Latanoprost  1 drop Right Eye QHS  . polyethylene glycol  17 g Oral Daily  . senna-docusate  1 tablet Oral BID  . traZODone  150 mg Oral QHS   Continuous Infusions: .  ceFAZolin (ANCEF) IV 2 g (04/13/20 0506)  . metronidazole 500 mg (04/13/20 0538)  . vancomycin 1,250 mg (04/12/20 1634)     LOS: 3 days    Time spent: 25 mins.More than 50% of that time was spent in counseling and/or coordination of care.      Shelly Coss, MD [facility] P4/05/2020, 11:47 AM

## 2020-04-13 NOTE — Discharge Instructions (Signed)
KEEP BANDAGE CLEAN AND DRY CALL OFFICE FOR F/U APPT [phone] in 9 days with Gertie Fey PA-C KEEP HAND ELEVATED ABOVE HEART OK TO APPLY ICE TO OPERATIVE AREA CONTACT OFFICE IF ANY WORSENING PAIN OR CONCERNS.

## 2020-04-13 NOTE — Progress Notes (Signed)
Patient was seen and evaluated today.  The bandage was rechanged today.  The wound looks a lot better.  There is no purulence.  He does have the bleeding after the bandage was changed today as to be expected given the anticoagulation.  A new compressive bandage was applied today.  The patient tolerated this well.  I think it is reasonable for him to go home on the oral antibiotics.  He needs to continue with the current dressing until he seen back in follow-up.  He needs to see my office staff next week next Thursday for wound check.  Keep the bandage on at all times.  I will be out of town next week and my staff will be there to look at him in the office.  Discharge instructions were placed in the computer.  I spoke with the nurse he is going to contact the medicine service about allowing him to go home this evening from an orthopedic perspective it is fine for him to go home but it is very prudent for him to maintain the bandage on at all times.

## 2020-04-14 DIAGNOSIS — M86141 Other acute osteomyelitis, right hand: Secondary | ICD-10-CM | POA: Diagnosis not present

## 2020-04-14 LAB — BASIC METABOLIC PANEL
Anion gap: 6 (ref 5–15)
BUN: 14 mg/dL (ref 8–23)
CO2: 29 mmol/L (ref 22–32)
Calcium: 9.7 mg/dL (ref 8.9–10.3)
Chloride: 105 mmol/L (ref 98–111)
Creatinine, Ser: 1.24 mg/dL (ref 0.61–1.24)
GFR, Estimated: 58 mL/min — ABNORMAL LOW (ref >60)
Glucose, Bld: 152 mg/dL — ABNORMAL HIGH (ref 70–99)
Potassium: 4 mmol/L (ref 3.5–5.1)
Sodium: 140 mmol/L (ref 135–145)

## 2020-04-14 LAB — GLUCOSE, CAPILLARY
Glucose-Capillary: 147 mg/dL — ABNORMAL HIGH (ref 70–99)
Glucose-Capillary: 129 mg/dL — ABNORMAL HIGH (ref 70–99)

## 2020-04-14 LAB — CULTURE, BLOOD (ROUTINE X 2)
Culture: NO GROWTH
Special Requests: ADEQUATE

## 2020-04-14 LAB — SURGICAL PATHOLOGY

## 2020-04-14 MED ORDER — POLYETHYLENE GLYCOL 3350 17 G PO PACK: 17.0000 g | PACK | Freq: Every day | ORAL | 0 refills | Status: DC | PRN

## 2020-04-14 MED ORDER — METOPROLOL TARTRATE 25 MG PO TABS: 12.5000 mg | ORAL_TABLET | Freq: Two times a day (BID) | ORAL | 0 refills | Status: DC

## 2020-04-14 MED ORDER — CEPHALEXIN 500 MG PO CAPS: 500.0000 mg | ORAL_CAPSULE | Freq: Three times a day (TID) | ORAL | 0 refills | Status: AC

## 2020-04-14 NOTE — Progress Notes (Signed)
Pt sat in the chair today. Right hand dressing with ace wrap dry and intact. Fingers are warm and mobile. Pt is very hard of hearing. Discharge instructions was given to pt's wife. Discharged to home.

## 2020-04-14 NOTE — TOC Transition Note (Addendum)
Transition of Care [facility]) - CM/SW Discharge Note   Patient Details  Name: [name] MRN: [id number] Date of Birth: [birth year]  [facility]) CM/SW Contact:  Sharin Mons, RN Phone Number: 04/14/2020, 10:23 AM   Clinical Narrative:    Patient will DC to: home  Anticipated DC date: 04/14/2020 Family notified: yes Transport by: car        s/p amputation of rt ring finger on 4/2  Per MD patient ready for DC today . RN, patient, and patient's family aware of DC. [place] made aware of d/c plan. DME( platform for walker), pt has @ bedside.   Pt without Rx med concerns.  Post hospital f/u noted on AVS.  Family to provide transportation to home.  RNCM will sign off for now as intervention is no longer needed. Please consult [place] again if new needs arise.   Final next level of care: [place] Barriers to Discharge: No Barriers Identified   Patient Goals and CMS Choice     Choice offered to / list presented to : Spouse  Discharge Placement                       Discharge Plan and Services   Discharge Planning Services: CM Consult            DME Arranged: Gilford Rile platform DME Agency: [employer] Date DME Agency Contacted: 04/12/20 Time DME Agency Contacted: [phone] Representative spoke with at DME Agency: [place] Arranged: PT   Date [place]: 04/12/20 Time [place]: [place] Representative spoke with at [place]: [place] ([place]) Interventions     Readmission Risk Interventions No flowsheet data found.

## 2020-04-14 NOTE — Discharge Summary (Signed)
Physician Discharge Summary  ZYMIR NAPOLI PNT:[id number] DOB: 1940-05-22 DOA: 04/09/2020  PCP: Lavone Orn, MD  Admit date: 04/09/2020 Discharge date: 04/14/2020  Admitted From: Home Disposition:  Home  Discharge Condition:Stable CODE STATUS:FULL Diet recommendation: Heart Healthy    Brief/Interim Summary:  Patient is a 80 year old male with history of diabetes type 2,  peripheral neuropathy, OSA, obesity, paroxysmal A. fib on Eliquis who presented with finger injury sustained about 3 weeks ago before admission.  He sustained a burn before he noticed blistering on his finger.  Orthopedics consulted and he underwent right finger amputation through the level of metacarpal, ray amputation of right ring finger with local neurectomies and primary closure on 4/1.  Orthopedics cleared him for discharge on oral antibiotics.  He will follow-up with orthopedics as an as possible as an outpatient.  PT recommend home health.  Medically stable for discharge today.  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  Surgical culture showed MSSA  WBC normalized, lactic acid is resolved.  Sepsis ruled out. Antibiotics changed to oral on discharge.  He will follow-up with orthopedics as an outpatient.  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  Kidney function around baseline.  I  Lasix and Micardis stopped  Paroxysmal A. fib: Currently in normal sinus rhythm.  Started on Lopressor here.  On Eliquis for anticoagulation  Non-insulin-dependent diabetes type 2: Metformin at home.   Peripheral neuropathy: Continue Neurontin  Hypertension: On Norvasc/Micardis/Lasix at home.  These medications have been held due  AKI.  Started on low-dose Lopressor.  Currently blood pressure  stable.  Morbid obesity: BMI 36.1.  History of OSA: Does not use CPAP at home.  Debility/deconditioning: Has gait problems.  PT/OT recommend home health on discharge.      Discharge Diagnoses:  Principal Problem:   Acute osteomyelitis of hand including fingers, right ([place]) Active Problems:   Diabetes mellitus type 2 in obese ([place])   Infection of right hand   PAF (paroxysmal atrial fibrillation) [facility])    Discharge Instructions  Discharge Instructions    Diet - low sodium heart healthy   Complete by: As directed    Discharge instructions   Complete by: As directed    1)Please take prescribed medications as instructed. 2)Follow up with orthopedics as an outpatient.  Name and number of the provider has been attached 3)Follow up with your PCP in a week   Discharge wound care:   Complete by: As directed    As per Ortho   Increase activity slowly   Complete by: As directed      Allergies as of 04/14/2020      Reactions   Ambien [zolpidem Tartrate]    "lost his mind"   Amlodipine Besylate Other (See Comments)   Other reaction(s): edema edema   Aspirin    Bleeding ulcer   Dorzolamide Hcl-timolol Mal    Other reaction(s): Other (See Comments), red and burning      Medication List    STOP taking these medications   amLODipine 5 MG tablet Commonly known as: NORVASC   furosemide 20 MG tablet Commonly known as: LASIX   potassium chloride 10 MEQ CR capsule Commonly known as: MICRO-K   sulfamethoxazole-trimethoprim 800-160 MG tablet Commonly known as: BACTRIM DS   telmisartan 80 MG tablet Commonly known as: MICARDIS  TAKE these medications   acetaminophen 500 MG tablet Commonly known as: TYLENOL Take 1,000 mg by mouth every 6 (six) hours as needed for moderate pain.   apixaban 5 MG Tabs tablet Commonly known as: ELIQUIS Take 1 tablet (5 mg total) by mouth 2 (two) times daily.   cephALEXin 500 MG capsule Commonly known as: KEFLEX Take 1 capsule  (500 mg total) by mouth 3 (three) times daily for 7 days.   Combigan 0.2-0.5 % ophthalmic solution Generic drug: brimonidine-timolol Place 1 drop into the right eye 2 (two) times daily.   DULoxetine 60 MG capsule Commonly known as: CYMBALTA Take 60 mg by mouth daily.   gabapentin 600 MG tablet Commonly known as: NEURONTIN Take 600 mg by mouth 2 (two) times daily. What changed: Another medication with the same name was removed. Continue taking this medication, and follow the directions you see here.   HYDROcodone-acetaminophen 5-325 MG tablet Commonly known as: NORCO/VICODIN Take 1 tablet by mouth every 6 (six) hours as needed.   ipratropium 0.03 % nasal spray Commonly known as: ATROVENT Place 2 sprays into both nostrils 2 (two) times daily as needed.   metFORMIN 500 MG 24 hr tablet Commonly known as: GLUCOPHAGE-XR Take 500 mg by mouth 2 (two) times daily.   metoprolol tartrate 25 MG tablet Commonly known as: LOPRESSOR Take 0.5 tablets (12.5 mg total) by mouth 2 (two) times daily.   multivitamin tablet Take 1 tablet by mouth daily.   polyethylene glycol 17 g packet Commonly known as: MIRALAX / GLYCOLAX Take 17 g by mouth daily as needed for moderate constipation.   Rocklatan 0.02-0.005 % Soln Generic drug: Netarsudil-Latanoprost Place 1 drop into the right eye at bedtime.   traZODone 150 MG tablet Commonly known as: DESYREL Take 150 mg by mouth at bedtime.   Trulicity 1.5 WU/8.8BV Sopn Generic drug: Dulaglutide Inject 1.5 mg into the skin every Saturday.            Durable Medical Equipment  (From admission, onward)         Start     Ordered   04/12/20 1318  For home use only DME Walker rolling  Once       Comments: R platform rolling walker  Question Answer Comment  Walker: With [place] Wheels   Patient needs a walker to treat with the following condition Weakness      04/12/20 1318           Discharge Care Instructions  (From admission,  onward)         Start     Ordered   04/14/20 0000  Discharge wound care:       Comments: As per Ortho   04/14/20 1039          Follow-up Information    Lavone Orn, MD Follow up in 1 week(s).   Specialty: Internal Medicine Why: hospital discharge follow up Contact information: [address] [place] [address] [phone]        Minus Breeding, MD Follow up.   Specialty: Cardiology Why: for afib management  Contact information: [address] [place] [address] [phone]        Brynda Peon, PA In 9 days.   Specialty: Orthopedic Surgery Contact information: [address] [place] [address] [phone]        [facility], [facility] Follow up.   Specialty: Home Health Services Why: home health PT/OT will be provided  by [facility], start of care will begin within 48 hours post discharge Contact information: [address] [place] [address] [phone]              Allergies  Allergen Reactions  . Ambien [Zolpidem Tartrate]     "lost his mind"  . Amlodipine Besylate Other (See Comments)    Other reaction(s): edema edema   . Aspirin     Bleeding ulcer  . Dorzolamide Hcl-Timolol Mal     Other reaction(s): Other (See Comments), red and burning    Consultations:  Orthopedics   Procedures/Studies: DG Hand Complete Right  Result Date: 04/10/2020 CLINICAL DATA:  Right hand wound EXAM: RIGHT HAND - COMPLETE 3+ VIEW COMPARISON:  None. FINDINGS: Soft tissue swelling in the right ring finger. Bone destruction in the distal phalanx concerning for osteomyelitis. Moderate to advanced degenerative changes in the IP joints and 1st carpometacarpal joint. IMPRESSION: Lucency in the distal phalanx of the right ring finger with bone destruction concerning for osteomyelitis. Diffuse soft tissue swelling throughout the right ring finger. Electronically Signed   By:  Rolm Baptise M.D.   On: 04/10/2020 02:06   DG MINI C-ARM IMAGE ONLY  Result Date: 04/10/2020 There is no interpretation for this exam.  This order is for images obtained during a surgical procedure.  Please See "Surgeries" Tab for more information regarding the procedure.       Subjective: Patient seen and examined the bedside this morning.  Hemodynamically stable for discharge today.  Discharge Exam: Vitals:   04/14/20 0410 04/14/20 0749  BP: (!) 149/89 139/74  Pulse: 64 65  Resp: 16 18  Temp: 98.9 F (37.2 C) 97.7 F (36.5 C)  SpO2: 98% 99%   Vitals:   04/13/20 1939 04/14/20 0215 04/14/20 0410 04/14/20 0749  BP: (!) 150/65 (!) 152/90 (!) 149/89 139/74  Pulse: 64 70 64 65  Resp: 18 17 16 18   Temp: 99.6 F (37.6 C) 99 F (37.2 C) 98.9 F (37.2 C) 97.7 F (36.5 C)  TempSrc: [place]  SpO2: 93% 96% 98% 99%  Weight:      Height:        General: Pt is alert, awake, not in acute distress Cardiovascular: RRR, S1/S2 +, no rubs, no gallops Respiratory: CTA bilaterally, no wheezing, no rhonchi Abdominal: Soft, NT, ND, bowel sounds + Extremities: no edema, no cyanosis    The results of significant diagnostics from this hospitalization (including imaging, microbiology, ancillary and laboratory) are listed below for reference.     Microbiology: Recent Results (from the past 240 hour(s))  Culture, blood (routine x 2)     Status: None (Preliminary result)   Collection Time: 04/09/20  8:00 PM   Specimen: BLOOD LEFT HAND  Result Value Ref Range Status   Specimen Description BLOOD LEFT HAND  Final   Special Requests AEROBIC BOTTLE ONLY Blood Culture adequate volume  Final   Culture   Final    NO GROWTH 3 DAYS Performed at [facility], [place] [address] [place], [place] [address]    Report Status PENDING  Incomplete  Culture, blood (routine x 2)     Status: None (Preliminary result)   Collection Time: 04/09/20  8:13 PM   Specimen: BLOOD LEFT ARM  Result  Value Ref Range Status   Specimen Description BLOOD LEFT ARM  Final   Special Requests   Final    BOTTLES DRAWN AEROBIC AND ANAEROBIC Blood Culture adequate volume  Culture   Final    NO GROWTH 4 DAYS Performed at [facility], [place] [address], [place], [place] [address]    Report Status PENDING  Incomplete  Resp Panel by RT-PCR (Flu A&B, Covid) Nasopharyngeal Swab     Status: None   Collection Time: 04/10/20  2:00 AM   Specimen: Nasopharyngeal Swab; Nasopharyngeal(NP) swabs in vial transport medium  Result Value Ref Range Status   SARS Coronavirus 2 by RT PCR NEGATIVE NEGATIVE Final    Comment: (NOTE) SARS-CoV-2 target nucleic acids are NOT DETECTED.  The SARS-CoV-2 RNA is generally detectable in upper respiratory specimens during the acute phase of infection. The lowest concentration of SARS-CoV-2 viral copies this assay can detect is 138 copies/mL. A negative result does not preclude SARS-Cov-2 infection and should not be used as the sole basis for treatment or other patient management decisions. A negative result may occur with  improper specimen collection/handling, submission of specimen other than nasopharyngeal swab, presence of viral mutation(s) within the areas targeted by this assay, and inadequate number of viral copies(<138 copies/mL). A negative result must be combined with clinical observations, patient history, and epidemiological information. The expected result is Negative.  Fact Sheet for Patients:  EntrepreneurPulse.com.au  Fact Sheet for Healthcare Providers:  IncredibleEmployment.be  This test is no t yet approved or cleared by the [place] FDA and  has been authorized for detection and/or diagnosis of SARS-CoV-2 by FDA under an Emergency Use Authorization (EUA). This EUA will remain  in effect (meaning this test can be used) for the duration of the COVID-19 declaration under Section 564(b)(1) of the Act,  21 U.S.C.section 360bbb-3(b)(1), unless the authorization is terminated  or revoked sooner.       Influenza A by PCR NEGATIVE NEGATIVE Final   Influenza B by PCR NEGATIVE NEGATIVE Final    Comment: (NOTE) The Xpert Xpress SARS-CoV-2/FLU/RSV plus assay is intended as an aid in the diagnosis of influenza from Nasopharyngeal swab specimens and should not be used as a sole basis for treatment. Nasal washings and aspirates are unacceptable for Xpert Xpress SARS-CoV-2/FLU/RSV testing.  Fact Sheet for Patients: EntrepreneurPulse.com.au  Fact Sheet for Healthcare Providers: IncredibleEmployment.be  This test is not yet approved or cleared by the [place] FDA and has been authorized for detection and/or diagnosis of SARS-CoV-2 by FDA under an Emergency Use Authorization (EUA). This EUA will remain in effect (meaning this test can be used) for the duration of the COVID-19 declaration under Section 564(b)(1) of the Act, 21 U.S.C. section 360bbb-3(b)(1), unless the authorization is terminated or revoked.  Performed at [facility], [place] [address], [place], [place] [address]   MRSA PCR Screening     Status: None   Collection Time: 04/10/20 10:05 AM   Specimen: Nasopharyngeal  Result Value Ref Range Status   MRSA by PCR NEGATIVE NEGATIVE Final    Comment:        The GeneXpert MRSA Assay (FDA approved for NASAL specimens only), is one component of a comprehensive MRSA colonization surveillance program. It is not intended to diagnose MRSA infection nor to guide or monitor treatment for MRSA infections. Performed at [facility], [place] [address], [place], [place] [address]   Aerobic/Anaerobic Culture w Gram Stain (surgical/deep wound)     Status: None (Preliminary result)   Collection Time: 04/10/20  1:03 PM   Specimen: Joint, Finger  Result Value Ref Range Status   Specimen Description FINGER  Final   Special  Requests RT RING  FINGER  Final   Gram Stain   Final    RARE WBC PRESENT,BOTH PMN AND MONONUCLEAR NO ORGANISMS SEEN Performed at [facility], [place] [address] [place], [place] [address]    Culture   Final    RARE STAPHYLOCOCCUS AUREUS SUSCEPTIBILITIES TO FOLLOW NO ANAEROBES ISOLATED; CULTURE IN PROGRESS FOR 5 DAYS    Report Status PENDING  Incomplete  Aerobic/Anaerobic Culture w Gram Stain (surgical/deep wound)     Status: None (Preliminary result)   Collection Time: 04/10/20  1:08 PM   Specimen: Soft Tissue, Other  Result Value Ref Range Status   Specimen Description TISSUE  Final   Special Requests SOFT TISSUE  Final   Gram Stain   Final    FEW WBC PRESENT,BOTH PMN AND MONONUCLEAR NO ORGANISMS SEEN    Culture   Final    FEW STAPHYLOCOCCUS AUREUS RARE DIPHTHEROIDS(CORYNEBACTERIUM SPECIES) Standardized susceptibility testing for this organism is not available. NO ANAEROBES ISOLATED; CULTURE IN PROGRESS FOR 5 DAYS CRITICAL RESULT CALLED TO, READ BACK BY AND VERIFIED WITH: RN SAM M. [phone] FCP Performed at [facility], [place] [address], [place],  [address]    Report Status PENDING  Incomplete   Organism ID, Bacteria STAPHYLOCOCCUS AUREUS  Final      Susceptibility   Staphylococcus aureus - MIC*    CIPROFLOXACIN <=0.5 SENSITIVE Sensitive     ERYTHROMYCIN <=0.25 SENSITIVE Sensitive     GENTAMICIN <=0.5 SENSITIVE Sensitive     OXACILLIN <=0.25 SENSITIVE Sensitive     TETRACYCLINE <=1 SENSITIVE Sensitive     VANCOMYCIN 1 SENSITIVE Sensitive     TRIMETH/SULFA <=10 SENSITIVE Sensitive     CLINDAMYCIN <=0.25 SENSITIVE Sensitive     RIFAMPIN <=0.5 SENSITIVE Sensitive     Inducible Clindamycin NEGATIVE Sensitive     * FEW STAPHYLOCOCCUS AUREUS     Labs: BNP (last 3 results) Recent Labs    07/11/19 1419  BNP 83.1   Basic Metabolic Panel: Recent Labs  Lab 04/10/20 0534 04/11/20 0210 04/12/20 0518 04/13/20 0244 04/14/20 0634  NA 134* 137 138 140 140  K 4.7 5.0  4.3 4.2 4.0  CL 101 102 103 106 105  CO2 21* 26 28 27 29   GLUCOSE 116* 131* 126* 141* 152*  BUN 17 20 15 17 14   CREATININE 1.51* 1.46* 1.30* 1.32* 1.24  CALCIUM 9.1 9.6 9.6 9.4 9.7  MG  --  2.2  --   --   --    Liver Function Tests: Recent Labs  Lab 04/09/20 2016  AST 31  ALT 39  ALKPHOS 87  BILITOT 0.7  PROT 7.3  ALBUMIN 2.9*   No results for input(s): LIPASE, AMYLASE in the last 168 hours. No results for input(s): AMMONIA in the last 168 hours. CBC: Recent Labs  Lab 04/09/20 2016 04/10/20 0534 04/10/20 1642 04/12/20 0518  WBC 13.4* 11.7* 10.4 8.9  NEUTROABS 10.1*  --  7.4  --   HGB 13.7 11.4* 11.0* 11.2*  HCT 42.7 35.3* 34.3* 35.3*  MCV 94.1 91.7 92.2 94.4  PLT 385 292 279 289   Cardiac Enzymes: No results for input(s): CKTOTAL, CKMB, CKMBINDEX, TROPONINI in the last 168 hours. BNP: Invalid input(s): POCBNP CBG: Recent Labs  Lab 04/13/20 1108 04/13/20 1541 04/13/20 1939 04/14/20 0452 04/14/20 0807  GLUCAP 152* 171* 152* 147* 129*   D-Dimer No results for input(s): DDIMER in the last 72 hours. Hgb A1c No results for input(s): HGBA1C in  the last 72 hours. Lipid Profile No results for input(s): CHOL, HDL, LDLCALC, TRIG, CHOLHDL, LDLDIRECT in the last 72 hours. Thyroid function studies No results for input(s): TSH, T4TOTAL, T3FREE, THYROIDAB in the last 72 hours.  Invalid input(s): FREET3 Anemia work up No results for input(s): VITAMINB12, FOLATE, FERRITIN, TIBC, IRON, RETICCTPCT in the last 72 hours. Urinalysis    Component Value Date/Time   COLORURINE YELLOW 04/11/2020 1416   APPEARANCEUR CLEAR 04/11/2020 1416   LABSPEC 1.014 04/11/2020 1416   PHURINE 5.0 04/11/2020 1416   GLUCOSEU NEGATIVE 04/11/2020 1416   HGBUR LARGE (A) 04/11/2020 1416   BILIRUBINUR NEGATIVE 04/11/2020 1416   [place] 04/11/2020 1416   PROTEINUR NEGATIVE 04/11/2020 1416   NITRITE NEGATIVE 04/11/2020 1416   LEUKOCYTESUR SMALL (A) 04/11/2020 1416   Sepsis  Labs Invalid input(s): PROCALCITONIN,  WBC,  LACTICIDVEN Microbiology Recent Results (from the past 240 hour(s))  Culture, blood (routine x 2)     Status: None (Preliminary result)   Collection Time: 04/09/20  8:00 PM   Specimen: BLOOD LEFT HAND  Result Value Ref Range Status   Specimen Description BLOOD LEFT HAND  Final   Special Requests AEROBIC BOTTLE ONLY Blood Culture adequate volume  Final   Culture   Final    NO GROWTH 3 DAYS Performed at [facility], [place] [address] [place], [place] [address]    Report Status PENDING  Incomplete  Culture, blood (routine x 2)     Status: None (Preliminary result)   Collection Time: 04/09/20  8:13 PM   Specimen: BLOOD LEFT ARM  Result Value Ref Range Status   Specimen Description BLOOD LEFT ARM  Final   Special Requests   Final    BOTTLES DRAWN AEROBIC AND ANAEROBIC Blood Culture adequate volume   Culture   Final    NO GROWTH 4 DAYS Performed at [facility], [place] [address], [place], [place] [address]    Report Status PENDING  Incomplete  Resp Panel by RT-PCR (Flu A&B, Covid) Nasopharyngeal Swab     Status: None   Collection Time: 04/10/20  2:00 AM   Specimen: Nasopharyngeal Swab; Nasopharyngeal(NP) swabs in vial transport medium  Result Value Ref Range Status   SARS Coronavirus 2 by RT PCR NEGATIVE NEGATIVE Final    Comment: (NOTE) SARS-CoV-2 target nucleic acids are NOT DETECTED.  The SARS-CoV-2 RNA is generally detectable in upper respiratory specimens during the acute phase of infection. The lowest concentration of SARS-CoV-2 viral copies this assay can detect is 138 copies/mL. A negative result does not preclude SARS-Cov-2 infection and should not be used as the sole basis for treatment or other patient management decisions. A negative result may occur with  improper specimen collection/handling, submission of specimen other than nasopharyngeal swab, presence of viral mutation(s) within the areas targeted by this  assay, and inadequate number of viral copies(<138 copies/mL). A negative result must be combined with clinical observations, patient history, and epidemiological information. The expected result is Negative.  Fact Sheet for Patients:  EntrepreneurPulse.com.au  Fact Sheet for Healthcare Providers:  IncredibleEmployment.be  This test is no t yet approved or cleared by the [place] FDA and  has been authorized for detection and/or diagnosis of SARS-CoV-2 by FDA under an Emergency Use Authorization (EUA). This EUA will remain  in effect (meaning this test can be used) for the duration of the COVID-19 declaration under Section 564(b)(1) of the Act, 21 U.S.C.section 360bbb-3(b)(1), unless the authorization is terminated  or revoked  sooner.       Influenza A by PCR NEGATIVE NEGATIVE Final   Influenza B by PCR NEGATIVE NEGATIVE Final    Comment: (NOTE) The Xpert Xpress SARS-CoV-2/FLU/RSV plus assay is intended as an aid in the diagnosis of influenza from Nasopharyngeal swab specimens and should not be used as a sole basis for treatment. Nasal washings and aspirates are unacceptable for Xpert Xpress SARS-CoV-2/FLU/RSV testing.  Fact Sheet for Patients: EntrepreneurPulse.com.au  Fact Sheet for Healthcare Providers: IncredibleEmployment.be  This test is not yet approved or cleared by the [place] FDA and has been authorized for detection and/or diagnosis of SARS-CoV-2 by FDA under an Emergency Use Authorization (EUA). This EUA will remain in effect (meaning this test can be used) for the duration of the COVID-19 declaration under Section 564(b)(1) of the Act, 21 U.S.C. section 360bbb-3(b)(1), unless the authorization is terminated or revoked.  Performed at [facility], [place] [address], [place], [place] [address]   MRSA PCR Screening     Status: None   Collection Time: 04/10/20 10:05 AM    Specimen: Nasopharyngeal  Result Value Ref Range Status   MRSA by PCR NEGATIVE NEGATIVE Final    Comment:        The GeneXpert MRSA Assay (FDA approved for NASAL specimens only), is one component of a comprehensive MRSA colonization surveillance program. It is not intended to diagnose MRSA infection nor to guide or monitor treatment for MRSA infections. Performed at [facility], [place] [address], [place], [place] [address]   Aerobic/Anaerobic Culture w Gram Stain (surgical/deep wound)     Status: None (Preliminary result)   Collection Time: 04/10/20  1:03 PM   Specimen: Joint, Finger  Result Value Ref Range Status   Specimen Description FINGER  Final   Special Requests RT RING FINGER  Final   Gram Stain   Final    RARE WBC PRESENT,BOTH PMN AND MONONUCLEAR NO ORGANISMS SEEN Performed at [facility], [address], [place], [place] [address]    Culture   Final    RARE STAPHYLOCOCCUS AUREUS SUSCEPTIBILITIES TO FOLLOW NO ANAEROBES ISOLATED; CULTURE IN PROGRESS FOR 5 DAYS    Report Status PENDING  Incomplete  Aerobic/Anaerobic Culture w Gram Stain (surgical/deep wound)     Status: None (Preliminary result)   Collection Time: 04/10/20  1:08 PM   Specimen: Soft Tissue, Other  Result Value Ref Range Status   Specimen Description TISSUE  Final   Special Requests SOFT TISSUE  Final   Gram Stain   Final    FEW WBC PRESENT,BOTH PMN AND MONONUCLEAR NO ORGANISMS SEEN    Culture   Final    FEW STAPHYLOCOCCUS AUREUS RARE DIPHTHEROIDS(CORYNEBACTERIUM SPECIES) Standardized susceptibility testing for this organism is not available. NO ANAEROBES ISOLATED; CULTURE IN PROGRESS FOR 5 DAYS CRITICAL RESULT CALLED TO, READ BACK BY AND VERIFIED WITH: RN SAM M. [phone] FCP Performed at [facility], [place] [address], [place], [place] [address]    Report Status PENDING  Incomplete   Organism ID, Bacteria STAPHYLOCOCCUS AUREUS  Final      Susceptibility   Staphylococcus  aureus - MIC*    CIPROFLOXACIN <=0.5 SENSITIVE Sensitive     ERYTHROMYCIN <=0.25 SENSITIVE Sensitive     GENTAMICIN <=0.5 SENSITIVE Sensitive     OXACILLIN <=0.25 SENSITIVE Sensitive     TETRACYCLINE <=1 SENSITIVE Sensitive     VANCOMYCIN 1 SENSITIVE Sensitive     TRIMETH/SULFA <=10 SENSITIVE Sensitive  CLINDAMYCIN <=0.25 SENSITIVE Sensitive     RIFAMPIN <=0.5 SENSITIVE Sensitive     Inducible Clindamycin NEGATIVE Sensitive     * FEW STAPHYLOCOCCUS AUREUS    Please note: You were cared for by a hospitalist during your hospital stay. Once you are discharged, your primary care physician will handle any further medical issues. Please note that NO REFILLS for any discharge medications will be authorized once you are discharged, as it is imperative that you return to your primary care physician (or establish a relationship with a primary care physician if you do not have one) for your post hospital discharge needs so that they can reassess your need for medications and monitor your lab values.    Time coordinating discharge: 40 minutes  SIGNED:   Shelly Coss, MD  [facility] 04/14/2020, 10:39 AM Pager [phone]  If 7PM-7AM, please contact night-coverage [email or web address] Password TRH1

## 2020-04-15 LAB — CULTURE, BLOOD (ROUTINE X 2)
Culture: NO GROWTH
Special Requests: ADEQUATE

## 2020-04-15 LAB — AEROBIC/ANAEROBIC CULTURE W GRAM STAIN (SURGICAL/DEEP WOUND)

## 2020-04-16 DIAGNOSIS — I129 Hypertensive chronic kidney disease with stage 1 through stage 4 chronic kidney disease, or unspecified chronic kidney disease: Secondary | ICD-10-CM | POA: Diagnosis not present

## 2020-04-16 DIAGNOSIS — M86141 Other acute osteomyelitis, right hand: Secondary | ICD-10-CM | POA: Diagnosis not present

## 2020-04-16 DIAGNOSIS — Z4781 Encounter for orthopedic aftercare following surgical amputation: Secondary | ICD-10-CM | POA: Diagnosis not present

## 2020-04-16 DIAGNOSIS — B9561 Methicillin susceptible Staphylococcus aureus infection as the cause of diseases classified elsewhere: Secondary | ICD-10-CM | POA: Diagnosis not present

## 2020-04-16 DIAGNOSIS — M6281 Muscle weakness (generalized): Secondary | ICD-10-CM | POA: Diagnosis not present

## 2020-04-16 DIAGNOSIS — E1169 Type 2 diabetes mellitus with other specified complication: Secondary | ICD-10-CM | POA: Diagnosis not present

## 2020-04-19 DIAGNOSIS — E1122 Type 2 diabetes mellitus with diabetic chronic kidney disease: Secondary | ICD-10-CM | POA: Diagnosis not present

## 2020-04-19 DIAGNOSIS — I129 Hypertensive chronic kidney disease with stage 1 through stage 4 chronic kidney disease, or unspecified chronic kidney disease: Secondary | ICD-10-CM | POA: Diagnosis not present

## 2020-04-19 DIAGNOSIS — E1169 Type 2 diabetes mellitus with other specified complication: Secondary | ICD-10-CM | POA: Diagnosis not present

## 2020-04-19 DIAGNOSIS — Z4781 Encounter for orthopedic aftercare following surgical amputation: Secondary | ICD-10-CM | POA: Diagnosis not present

## 2020-04-19 DIAGNOSIS — I10 Essential (primary) hypertension: Secondary | ICD-10-CM | POA: Diagnosis not present

## 2020-04-19 DIAGNOSIS — E781 Pure hyperglyceridemia: Secondary | ICD-10-CM | POA: Diagnosis not present

## 2020-04-19 DIAGNOSIS — G8929 Other chronic pain: Secondary | ICD-10-CM | POA: Diagnosis not present

## 2020-04-19 DIAGNOSIS — M179 Osteoarthritis of knee, unspecified: Secondary | ICD-10-CM | POA: Diagnosis not present

## 2020-04-19 DIAGNOSIS — B9561 Methicillin susceptible Staphylococcus aureus infection as the cause of diseases classified elsewhere: Secondary | ICD-10-CM | POA: Diagnosis not present

## 2020-04-19 DIAGNOSIS — N1831 Chronic kidney disease, stage 3a: Secondary | ICD-10-CM | POA: Diagnosis not present

## 2020-04-19 DIAGNOSIS — M86141 Other acute osteomyelitis, right hand: Secondary | ICD-10-CM | POA: Diagnosis not present

## 2020-04-21 DIAGNOSIS — M86141 Other acute osteomyelitis, right hand: Secondary | ICD-10-CM | POA: Diagnosis not present

## 2020-04-21 DIAGNOSIS — B9561 Methicillin susceptible Staphylococcus aureus infection as the cause of diseases classified elsewhere: Secondary | ICD-10-CM | POA: Diagnosis not present

## 2020-04-21 DIAGNOSIS — I129 Hypertensive chronic kidney disease with stage 1 through stage 4 chronic kidney disease, or unspecified chronic kidney disease: Secondary | ICD-10-CM | POA: Diagnosis not present

## 2020-04-21 DIAGNOSIS — Z4781 Encounter for orthopedic aftercare following surgical amputation: Secondary | ICD-10-CM | POA: Diagnosis not present

## 2020-04-21 DIAGNOSIS — E1169 Type 2 diabetes mellitus with other specified complication: Secondary | ICD-10-CM | POA: Diagnosis not present

## 2020-04-27 ENCOUNTER — Ambulatory Visit
Admission: RE | Admit: 2020-04-27 | Discharge: 2020-04-27 | Disposition: A | Discharge status: Home / Self Care | Payer: Medicare Other | Source: Ambulatory Visit | Attending: Internal Medicine | Admitting: Internal Medicine

## 2020-04-27 ENCOUNTER — Other Ambulatory Visit: Payer: Self-pay | Admitting: Internal Medicine

## 2020-04-27 DIAGNOSIS — I129 Hypertensive chronic kidney disease with stage 1 through stage 4 chronic kidney disease, or unspecified chronic kidney disease: Secondary | ICD-10-CM | POA: Diagnosis not present

## 2020-04-27 DIAGNOSIS — E1122 Type 2 diabetes mellitus with diabetic chronic kidney disease: Secondary | ICD-10-CM | POA: Diagnosis not present

## 2020-04-27 DIAGNOSIS — M869 Osteomyelitis, unspecified: Secondary | ICD-10-CM | POA: Diagnosis not present

## 2020-04-27 DIAGNOSIS — M25561 Pain in right knee: Secondary | ICD-10-CM

## 2020-04-27 DIAGNOSIS — I739 Peripheral vascular disease, unspecified: Secondary | ICD-10-CM | POA: Diagnosis not present

## 2020-04-27 DIAGNOSIS — M1711 Unilateral primary osteoarthritis, right knee: Secondary | ICD-10-CM | POA: Diagnosis not present

## 2020-04-27 IMAGING — DX DG KNEE 1-2V*R*
2 series · 2 of 2 positions shown · non-contrast
Comparison: No prior.

CLINICAL DATA: Acute pain right knee.

EXAM:
RIGHT KNEE - 1-2 VIEW

[dg knee 1-2 views right (1 of 2)]
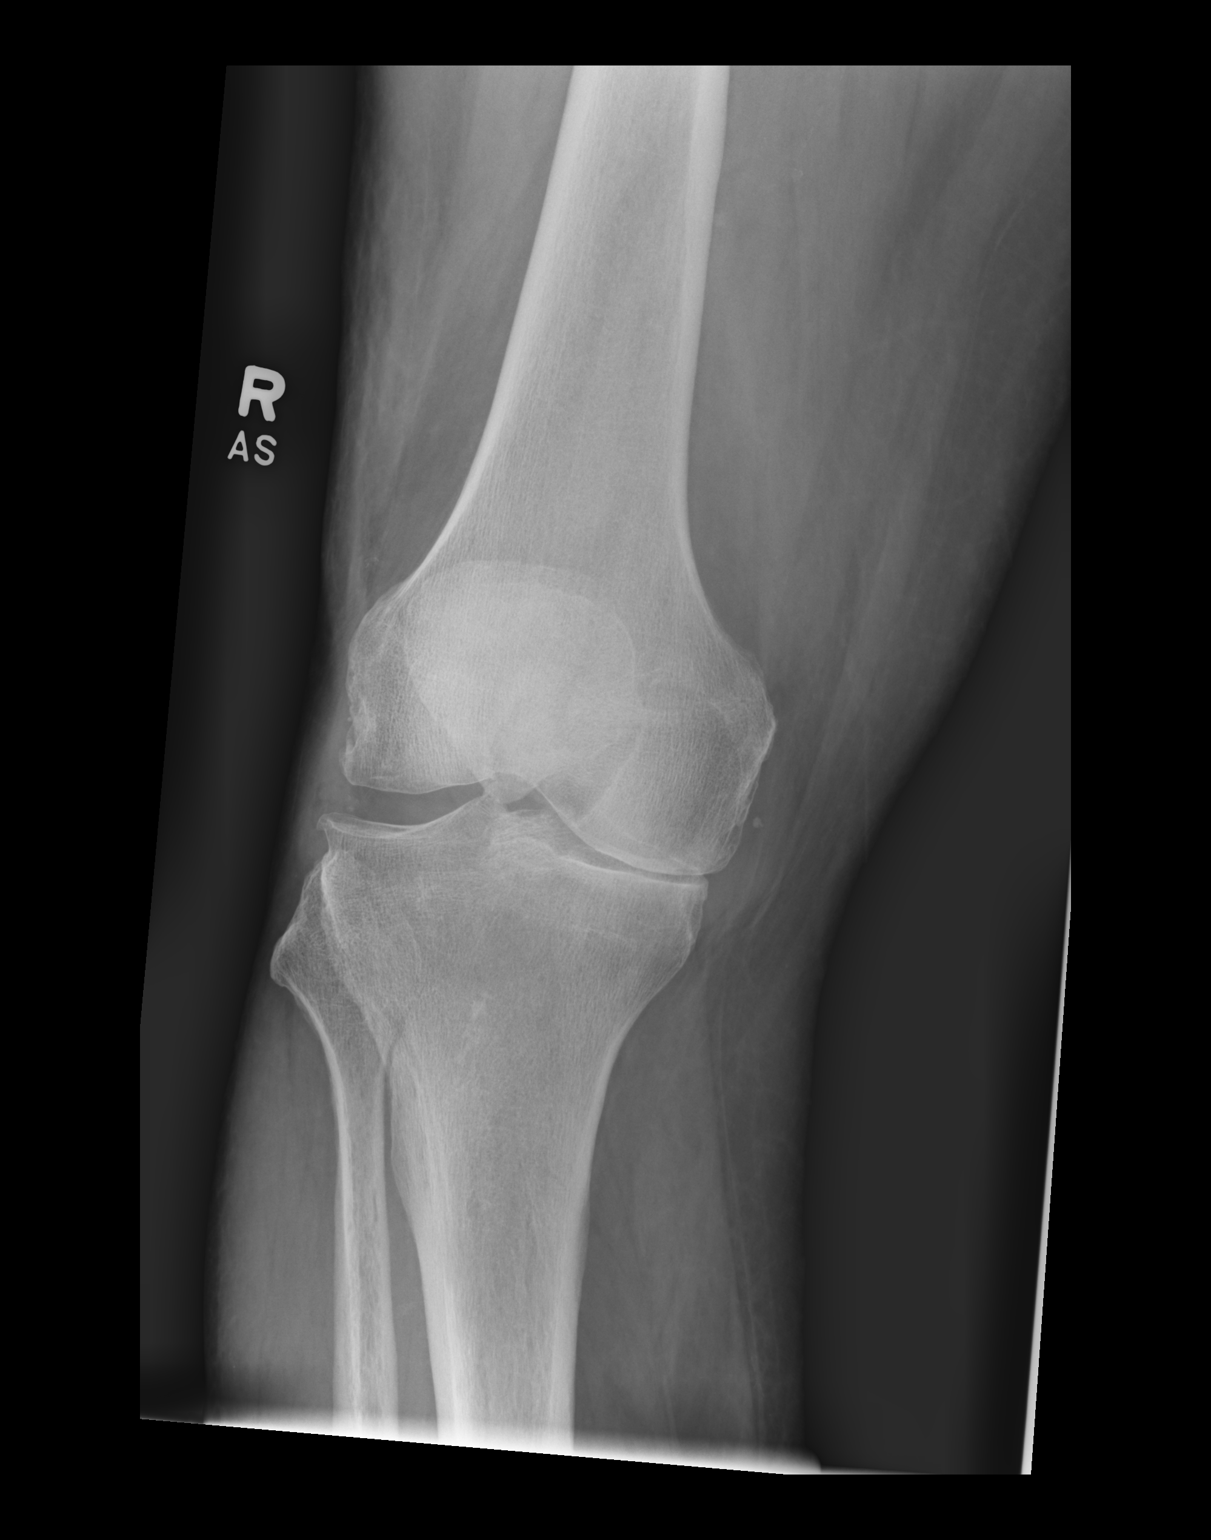

[dg knee 1-2 views right (2 of 2)]
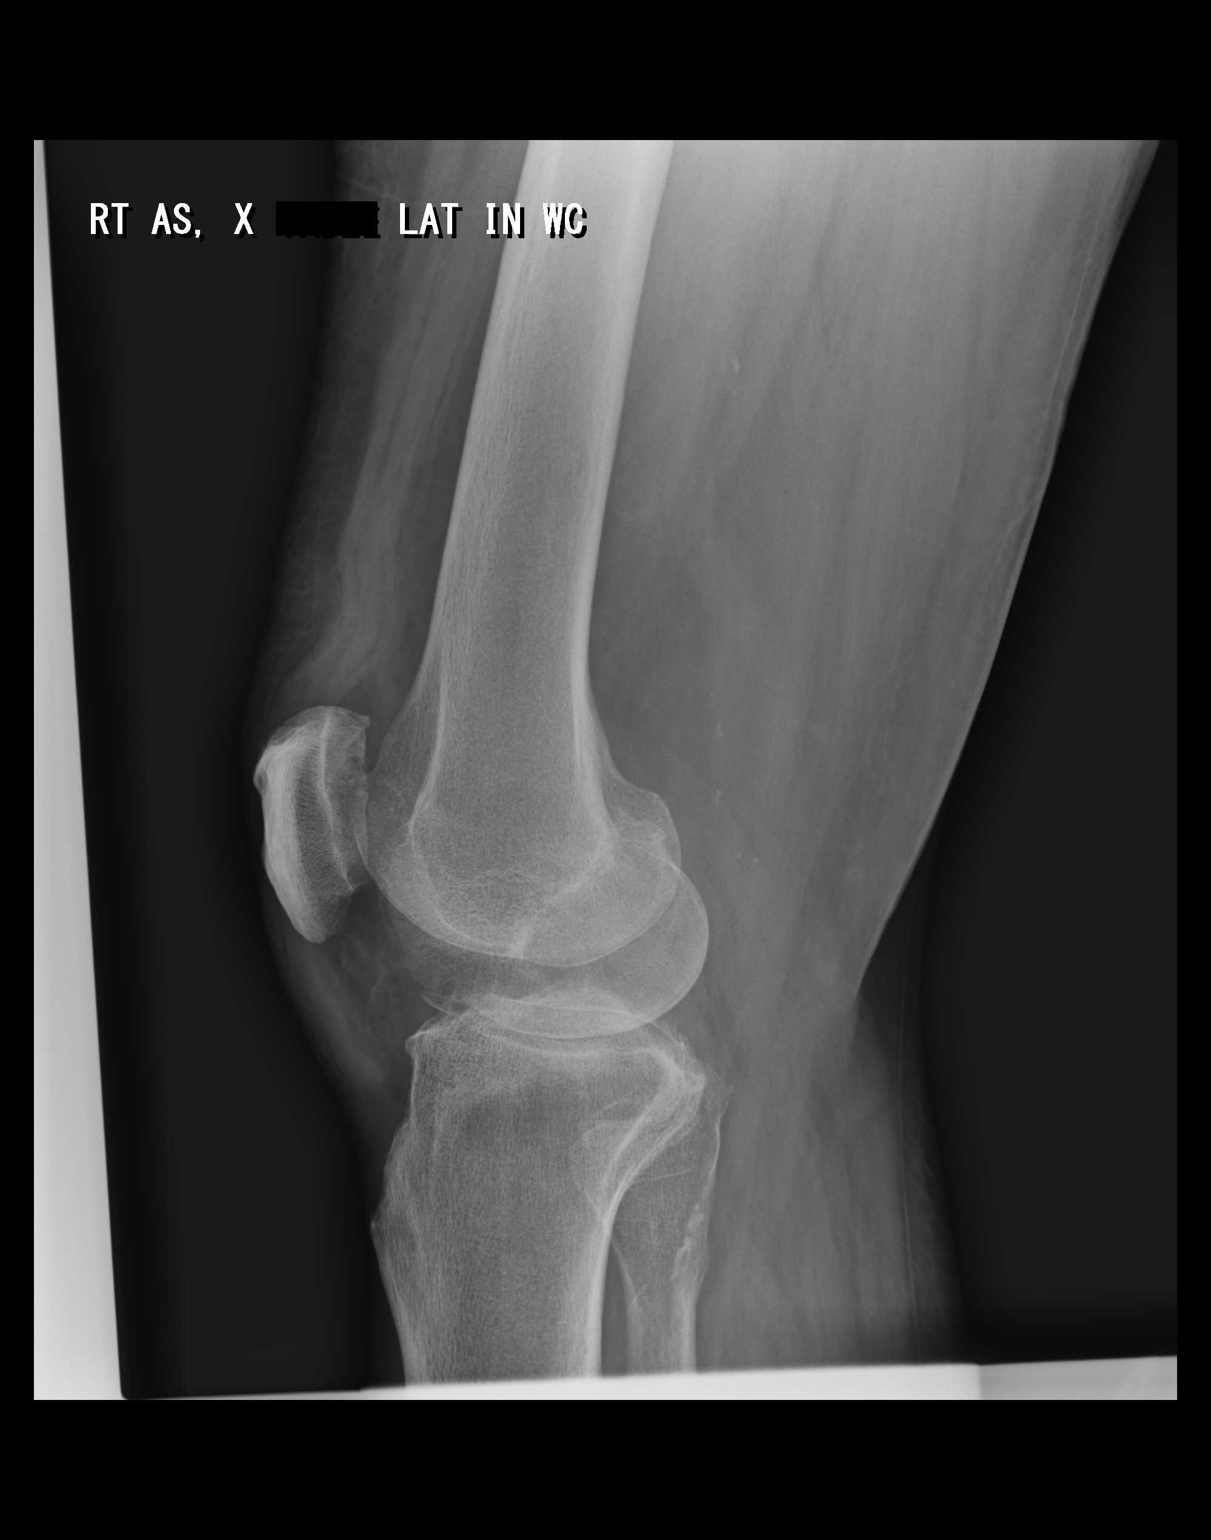

[2 of 2 positions shown; findings below may reference images not displayed]

FINDINGS: Prominent tricompartment degenerative change. Degenerative changes
most prominent about the medial compartment. Tiny loose bodies may
be present. Punctate calcified density noted adjacent to the medial
femoral condyle, possibly related to old ligamentous injury. No
evidence of acute fracture or dislocation.
IMPRESSION: 1. Prominent tricompartment degenerative change. Degenerative
changes most prominent about the medial compartment. Loose bodies
may be present. No acute bony or joint abnormality.

2.  Peripheral vascular disease.

## 2020-04-28 DIAGNOSIS — E1169 Type 2 diabetes mellitus with other specified complication: Secondary | ICD-10-CM | POA: Diagnosis not present

## 2020-04-28 DIAGNOSIS — Z4781 Encounter for orthopedic aftercare following surgical amputation: Secondary | ICD-10-CM | POA: Diagnosis not present

## 2020-04-28 DIAGNOSIS — B9561 Methicillin susceptible Staphylococcus aureus infection as the cause of diseases classified elsewhere: Secondary | ICD-10-CM | POA: Diagnosis not present

## 2020-04-28 DIAGNOSIS — I129 Hypertensive chronic kidney disease with stage 1 through stage 4 chronic kidney disease, or unspecified chronic kidney disease: Secondary | ICD-10-CM | POA: Diagnosis not present

## 2020-04-28 DIAGNOSIS — M86141 Other acute osteomyelitis, right hand: Secondary | ICD-10-CM | POA: Diagnosis not present

## 2020-05-03 DIAGNOSIS — B9561 Methicillin susceptible Staphylococcus aureus infection as the cause of diseases classified elsewhere: Secondary | ICD-10-CM | POA: Diagnosis not present

## 2020-05-03 DIAGNOSIS — I129 Hypertensive chronic kidney disease with stage 1 through stage 4 chronic kidney disease, or unspecified chronic kidney disease: Secondary | ICD-10-CM | POA: Diagnosis not present

## 2020-05-03 DIAGNOSIS — E1169 Type 2 diabetes mellitus with other specified complication: Secondary | ICD-10-CM | POA: Diagnosis not present

## 2020-05-03 DIAGNOSIS — Z4781 Encounter for orthopedic aftercare following surgical amputation: Secondary | ICD-10-CM | POA: Diagnosis not present

## 2020-05-03 DIAGNOSIS — M86141 Other acute osteomyelitis, right hand: Secondary | ICD-10-CM | POA: Diagnosis not present

## 2020-05-05 DIAGNOSIS — E1169 Type 2 diabetes mellitus with other specified complication: Secondary | ICD-10-CM | POA: Diagnosis not present

## 2020-05-05 DIAGNOSIS — Z4781 Encounter for orthopedic aftercare following surgical amputation: Secondary | ICD-10-CM | POA: Diagnosis not present

## 2020-05-05 DIAGNOSIS — M86141 Other acute osteomyelitis, right hand: Secondary | ICD-10-CM | POA: Diagnosis not present

## 2020-05-05 DIAGNOSIS — I129 Hypertensive chronic kidney disease with stage 1 through stage 4 chronic kidney disease, or unspecified chronic kidney disease: Secondary | ICD-10-CM | POA: Diagnosis not present

## 2020-05-05 DIAGNOSIS — B9561 Methicillin susceptible Staphylococcus aureus infection as the cause of diseases classified elsewhere: Secondary | ICD-10-CM | POA: Diagnosis not present

## 2020-05-06 DIAGNOSIS — B9561 Methicillin susceptible Staphylococcus aureus infection as the cause of diseases classified elsewhere: Secondary | ICD-10-CM | POA: Diagnosis not present

## 2020-05-06 DIAGNOSIS — E1169 Type 2 diabetes mellitus with other specified complication: Secondary | ICD-10-CM | POA: Diagnosis not present

## 2020-05-06 DIAGNOSIS — Z4781 Encounter for orthopedic aftercare following surgical amputation: Secondary | ICD-10-CM | POA: Diagnosis not present

## 2020-05-06 DIAGNOSIS — M86141 Other acute osteomyelitis, right hand: Secondary | ICD-10-CM | POA: Diagnosis not present

## 2020-05-06 DIAGNOSIS — I129 Hypertensive chronic kidney disease with stage 1 through stage 4 chronic kidney disease, or unspecified chronic kidney disease: Secondary | ICD-10-CM | POA: Diagnosis not present

## 2020-05-07 DIAGNOSIS — R6889 Other general symptoms and signs: Secondary | ICD-10-CM | POA: Diagnosis not present

## 2020-05-10 DIAGNOSIS — R6889 Other general symptoms and signs: Secondary | ICD-10-CM | POA: Diagnosis not present

## 2020-05-10 DIAGNOSIS — G8929 Other chronic pain: Secondary | ICD-10-CM | POA: Diagnosis not present

## 2020-05-10 DIAGNOSIS — M4696 Unspecified inflammatory spondylopathy, lumbar region: Secondary | ICD-10-CM | POA: Diagnosis not present

## 2020-05-10 DIAGNOSIS — Z5181 Encounter for therapeutic drug level monitoring: Secondary | ICD-10-CM | POA: Diagnosis not present

## 2020-05-10 DIAGNOSIS — M179 Osteoarthritis of knee, unspecified: Secondary | ICD-10-CM | POA: Diagnosis not present

## 2020-05-10 DIAGNOSIS — I129 Hypertensive chronic kidney disease with stage 1 through stage 4 chronic kidney disease, or unspecified chronic kidney disease: Secondary | ICD-10-CM | POA: Diagnosis not present

## 2020-05-10 DIAGNOSIS — B9561 Methicillin susceptible Staphylococcus aureus infection as the cause of diseases classified elsewhere: Secondary | ICD-10-CM | POA: Diagnosis not present

## 2020-05-10 DIAGNOSIS — M86141 Other acute osteomyelitis, right hand: Secondary | ICD-10-CM | POA: Diagnosis not present

## 2020-05-10 DIAGNOSIS — Z4781 Encounter for orthopedic aftercare following surgical amputation: Secondary | ICD-10-CM | POA: Diagnosis not present

## 2020-05-10 DIAGNOSIS — E1169 Type 2 diabetes mellitus with other specified complication: Secondary | ICD-10-CM | POA: Diagnosis not present

## 2020-05-11 DIAGNOSIS — Z4781 Encounter for orthopedic aftercare following surgical amputation: Secondary | ICD-10-CM | POA: Diagnosis not present

## 2020-05-11 DIAGNOSIS — M86141 Other acute osteomyelitis, right hand: Secondary | ICD-10-CM | POA: Diagnosis not present

## 2020-05-11 DIAGNOSIS — I129 Hypertensive chronic kidney disease with stage 1 through stage 4 chronic kidney disease, or unspecified chronic kidney disease: Secondary | ICD-10-CM | POA: Diagnosis not present

## 2020-05-11 DIAGNOSIS — B9561 Methicillin susceptible Staphylococcus aureus infection as the cause of diseases classified elsewhere: Secondary | ICD-10-CM | POA: Diagnosis not present

## 2020-05-11 DIAGNOSIS — E1169 Type 2 diabetes mellitus with other specified complication: Secondary | ICD-10-CM | POA: Diagnosis not present

## 2020-05-12 DIAGNOSIS — E1169 Type 2 diabetes mellitus with other specified complication: Secondary | ICD-10-CM | POA: Diagnosis not present

## 2020-05-12 DIAGNOSIS — M86141 Other acute osteomyelitis, right hand: Secondary | ICD-10-CM | POA: Diagnosis not present

## 2020-05-12 DIAGNOSIS — B9561 Methicillin susceptible Staphylococcus aureus infection as the cause of diseases classified elsewhere: Secondary | ICD-10-CM | POA: Diagnosis not present

## 2020-05-12 DIAGNOSIS — Z4781 Encounter for orthopedic aftercare following surgical amputation: Secondary | ICD-10-CM | POA: Diagnosis not present

## 2020-05-12 DIAGNOSIS — I129 Hypertensive chronic kidney disease with stage 1 through stage 4 chronic kidney disease, or unspecified chronic kidney disease: Secondary | ICD-10-CM | POA: Diagnosis not present

## 2020-05-16 DIAGNOSIS — M6281 Muscle weakness (generalized): Secondary | ICD-10-CM | POA: Diagnosis not present

## 2020-05-16 DIAGNOSIS — T161XXA Foreign body in right ear, initial encounter: Secondary | ICD-10-CM | POA: Diagnosis not present

## 2020-05-17 DIAGNOSIS — E1169 Type 2 diabetes mellitus with other specified complication: Secondary | ICD-10-CM | POA: Diagnosis not present

## 2020-05-17 DIAGNOSIS — I129 Hypertensive chronic kidney disease with stage 1 through stage 4 chronic kidney disease, or unspecified chronic kidney disease: Secondary | ICD-10-CM | POA: Diagnosis not present

## 2020-05-17 DIAGNOSIS — B9561 Methicillin susceptible Staphylococcus aureus infection as the cause of diseases classified elsewhere: Secondary | ICD-10-CM | POA: Diagnosis not present

## 2020-05-17 DIAGNOSIS — Z4781 Encounter for orthopedic aftercare following surgical amputation: Secondary | ICD-10-CM | POA: Diagnosis not present

## 2020-05-17 DIAGNOSIS — M86141 Other acute osteomyelitis, right hand: Secondary | ICD-10-CM | POA: Diagnosis not present

## 2020-05-19 DIAGNOSIS — E1169 Type 2 diabetes mellitus with other specified complication: Secondary | ICD-10-CM | POA: Diagnosis not present

## 2020-05-19 DIAGNOSIS — I129 Hypertensive chronic kidney disease with stage 1 through stage 4 chronic kidney disease, or unspecified chronic kidney disease: Secondary | ICD-10-CM | POA: Diagnosis not present

## 2020-05-19 DIAGNOSIS — B9561 Methicillin susceptible Staphylococcus aureus infection as the cause of diseases classified elsewhere: Secondary | ICD-10-CM | POA: Diagnosis not present

## 2020-05-19 DIAGNOSIS — M86141 Other acute osteomyelitis, right hand: Secondary | ICD-10-CM | POA: Diagnosis not present

## 2020-05-19 DIAGNOSIS — Z4781 Encounter for orthopedic aftercare following surgical amputation: Secondary | ICD-10-CM | POA: Diagnosis not present

## 2020-05-24 DIAGNOSIS — I129 Hypertensive chronic kidney disease with stage 1 through stage 4 chronic kidney disease, or unspecified chronic kidney disease: Secondary | ICD-10-CM | POA: Diagnosis not present

## 2020-05-24 DIAGNOSIS — Z4781 Encounter for orthopedic aftercare following surgical amputation: Secondary | ICD-10-CM | POA: Diagnosis not present

## 2020-05-24 DIAGNOSIS — M86141 Other acute osteomyelitis, right hand: Secondary | ICD-10-CM | POA: Diagnosis not present

## 2020-05-24 DIAGNOSIS — E1169 Type 2 diabetes mellitus with other specified complication: Secondary | ICD-10-CM | POA: Diagnosis not present

## 2020-05-24 DIAGNOSIS — B9561 Methicillin susceptible Staphylococcus aureus infection as the cause of diseases classified elsewhere: Secondary | ICD-10-CM | POA: Diagnosis not present

## 2020-05-26 DIAGNOSIS — M86141 Other acute osteomyelitis, right hand: Secondary | ICD-10-CM | POA: Diagnosis not present

## 2020-05-26 DIAGNOSIS — E1169 Type 2 diabetes mellitus with other specified complication: Secondary | ICD-10-CM | POA: Diagnosis not present

## 2020-05-26 DIAGNOSIS — I129 Hypertensive chronic kidney disease with stage 1 through stage 4 chronic kidney disease, or unspecified chronic kidney disease: Secondary | ICD-10-CM | POA: Diagnosis not present

## 2020-05-26 DIAGNOSIS — Z4781 Encounter for orthopedic aftercare following surgical amputation: Secondary | ICD-10-CM | POA: Diagnosis not present

## 2020-05-26 DIAGNOSIS — B9561 Methicillin susceptible Staphylococcus aureus infection as the cause of diseases classified elsewhere: Secondary | ICD-10-CM | POA: Diagnosis not present

## 2020-05-27 DIAGNOSIS — Z4781 Encounter for orthopedic aftercare following surgical amputation: Secondary | ICD-10-CM | POA: Diagnosis not present

## 2020-05-27 DIAGNOSIS — I129 Hypertensive chronic kidney disease with stage 1 through stage 4 chronic kidney disease, or unspecified chronic kidney disease: Secondary | ICD-10-CM | POA: Diagnosis not present

## 2020-05-27 DIAGNOSIS — M86141 Other acute osteomyelitis, right hand: Secondary | ICD-10-CM | POA: Diagnosis not present

## 2020-05-27 DIAGNOSIS — B9561 Methicillin susceptible Staphylococcus aureus infection as the cause of diseases classified elsewhere: Secondary | ICD-10-CM | POA: Diagnosis not present

## 2020-05-27 DIAGNOSIS — E1169 Type 2 diabetes mellitus with other specified complication: Secondary | ICD-10-CM | POA: Diagnosis not present

## 2020-05-28 DIAGNOSIS — B9561 Methicillin susceptible Staphylococcus aureus infection as the cause of diseases classified elsewhere: Secondary | ICD-10-CM | POA: Diagnosis not present

## 2020-05-28 DIAGNOSIS — Z4781 Encounter for orthopedic aftercare following surgical amputation: Secondary | ICD-10-CM | POA: Diagnosis not present

## 2020-05-28 DIAGNOSIS — E1169 Type 2 diabetes mellitus with other specified complication: Secondary | ICD-10-CM | POA: Diagnosis not present

## 2020-05-28 DIAGNOSIS — M86141 Other acute osteomyelitis, right hand: Secondary | ICD-10-CM | POA: Diagnosis not present

## 2020-05-28 DIAGNOSIS — I129 Hypertensive chronic kidney disease with stage 1 through stage 4 chronic kidney disease, or unspecified chronic kidney disease: Secondary | ICD-10-CM | POA: Diagnosis not present

## 2020-05-31 DIAGNOSIS — Z4781 Encounter for orthopedic aftercare following surgical amputation: Secondary | ICD-10-CM | POA: Diagnosis not present

## 2020-05-31 DIAGNOSIS — E1169 Type 2 diabetes mellitus with other specified complication: Secondary | ICD-10-CM | POA: Diagnosis not present

## 2020-05-31 DIAGNOSIS — B9561 Methicillin susceptible Staphylococcus aureus infection as the cause of diseases classified elsewhere: Secondary | ICD-10-CM | POA: Diagnosis not present

## 2020-05-31 DIAGNOSIS — M86141 Other acute osteomyelitis, right hand: Secondary | ICD-10-CM | POA: Diagnosis not present

## 2020-05-31 DIAGNOSIS — I129 Hypertensive chronic kidney disease with stage 1 through stage 4 chronic kidney disease, or unspecified chronic kidney disease: Secondary | ICD-10-CM | POA: Diagnosis not present

## 2020-06-01 DIAGNOSIS — Z4781 Encounter for orthopedic aftercare following surgical amputation: Secondary | ICD-10-CM | POA: Diagnosis not present

## 2020-06-01 DIAGNOSIS — B9561 Methicillin susceptible Staphylococcus aureus infection as the cause of diseases classified elsewhere: Secondary | ICD-10-CM | POA: Diagnosis not present

## 2020-06-01 DIAGNOSIS — I129 Hypertensive chronic kidney disease with stage 1 through stage 4 chronic kidney disease, or unspecified chronic kidney disease: Secondary | ICD-10-CM | POA: Diagnosis not present

## 2020-06-01 DIAGNOSIS — E1169 Type 2 diabetes mellitus with other specified complication: Secondary | ICD-10-CM | POA: Diagnosis not present

## 2020-06-01 DIAGNOSIS — M86141 Other acute osteomyelitis, right hand: Secondary | ICD-10-CM | POA: Diagnosis not present

## 2020-06-02 DIAGNOSIS — M86141 Other acute osteomyelitis, right hand: Secondary | ICD-10-CM | POA: Diagnosis not present

## 2020-06-02 DIAGNOSIS — B9561 Methicillin susceptible Staphylococcus aureus infection as the cause of diseases classified elsewhere: Secondary | ICD-10-CM | POA: Diagnosis not present

## 2020-06-02 DIAGNOSIS — I129 Hypertensive chronic kidney disease with stage 1 through stage 4 chronic kidney disease, or unspecified chronic kidney disease: Secondary | ICD-10-CM | POA: Diagnosis not present

## 2020-06-02 DIAGNOSIS — Z4781 Encounter for orthopedic aftercare following surgical amputation: Secondary | ICD-10-CM | POA: Diagnosis not present

## 2020-06-02 DIAGNOSIS — E1169 Type 2 diabetes mellitus with other specified complication: Secondary | ICD-10-CM | POA: Diagnosis not present

## 2020-06-03 DIAGNOSIS — E1169 Type 2 diabetes mellitus with other specified complication: Secondary | ICD-10-CM | POA: Diagnosis not present

## 2020-06-03 DIAGNOSIS — M86141 Other acute osteomyelitis, right hand: Secondary | ICD-10-CM | POA: Diagnosis not present

## 2020-06-03 DIAGNOSIS — B9561 Methicillin susceptible Staphylococcus aureus infection as the cause of diseases classified elsewhere: Secondary | ICD-10-CM | POA: Diagnosis not present

## 2020-06-03 DIAGNOSIS — Z4781 Encounter for orthopedic aftercare following surgical amputation: Secondary | ICD-10-CM | POA: Diagnosis not present

## 2020-06-03 DIAGNOSIS — I129 Hypertensive chronic kidney disease with stage 1 through stage 4 chronic kidney disease, or unspecified chronic kidney disease: Secondary | ICD-10-CM | POA: Diagnosis not present

## 2020-06-03 DIAGNOSIS — I872 Venous insufficiency (chronic) (peripheral): Secondary | ICD-10-CM | POA: Diagnosis not present

## 2020-06-04 DIAGNOSIS — M25641 Stiffness of right hand, not elsewhere classified: Secondary | ICD-10-CM | POA: Diagnosis not present

## 2020-06-07 DIAGNOSIS — I129 Hypertensive chronic kidney disease with stage 1 through stage 4 chronic kidney disease, or unspecified chronic kidney disease: Secondary | ICD-10-CM | POA: Diagnosis not present

## 2020-06-07 DIAGNOSIS — B9561 Methicillin susceptible Staphylococcus aureus infection as the cause of diseases classified elsewhere: Secondary | ICD-10-CM | POA: Diagnosis not present

## 2020-06-07 DIAGNOSIS — M86141 Other acute osteomyelitis, right hand: Secondary | ICD-10-CM | POA: Diagnosis not present

## 2020-06-07 DIAGNOSIS — E1169 Type 2 diabetes mellitus with other specified complication: Secondary | ICD-10-CM | POA: Diagnosis not present

## 2020-06-07 DIAGNOSIS — Z4781 Encounter for orthopedic aftercare following surgical amputation: Secondary | ICD-10-CM | POA: Diagnosis not present

## 2020-06-08 DIAGNOSIS — B9561 Methicillin susceptible Staphylococcus aureus infection as the cause of diseases classified elsewhere: Secondary | ICD-10-CM | POA: Diagnosis not present

## 2020-06-08 DIAGNOSIS — M86141 Other acute osteomyelitis, right hand: Secondary | ICD-10-CM | POA: Diagnosis not present

## 2020-06-08 DIAGNOSIS — I129 Hypertensive chronic kidney disease with stage 1 through stage 4 chronic kidney disease, or unspecified chronic kidney disease: Secondary | ICD-10-CM | POA: Diagnosis not present

## 2020-06-08 DIAGNOSIS — Z4781 Encounter for orthopedic aftercare following surgical amputation: Secondary | ICD-10-CM | POA: Diagnosis not present

## 2020-06-08 DIAGNOSIS — E1169 Type 2 diabetes mellitus with other specified complication: Secondary | ICD-10-CM | POA: Diagnosis not present

## 2020-06-09 DIAGNOSIS — E1169 Type 2 diabetes mellitus with other specified complication: Secondary | ICD-10-CM | POA: Diagnosis not present

## 2020-06-09 DIAGNOSIS — Z4781 Encounter for orthopedic aftercare following surgical amputation: Secondary | ICD-10-CM | POA: Diagnosis not present

## 2020-06-09 DIAGNOSIS — E1122 Type 2 diabetes mellitus with diabetic chronic kidney disease: Secondary | ICD-10-CM | POA: Diagnosis not present

## 2020-06-09 DIAGNOSIS — I129 Hypertensive chronic kidney disease with stage 1 through stage 4 chronic kidney disease, or unspecified chronic kidney disease: Secondary | ICD-10-CM | POA: Diagnosis not present

## 2020-06-09 DIAGNOSIS — N182 Chronic kidney disease, stage 2 (mild): Secondary | ICD-10-CM | POA: Diagnosis not present

## 2020-06-10 DIAGNOSIS — Z794 Long term (current) use of insulin: Secondary | ICD-10-CM | POA: Diagnosis not present

## 2020-06-10 DIAGNOSIS — N1831 Chronic kidney disease, stage 3a: Secondary | ICD-10-CM | POA: Diagnosis not present

## 2020-06-10 DIAGNOSIS — L98491 Non-pressure chronic ulcer of skin of other sites limited to breakdown of skin: Secondary | ICD-10-CM | POA: Diagnosis not present

## 2020-06-10 DIAGNOSIS — M4696 Unspecified inflammatory spondylopathy, lumbar region: Secondary | ICD-10-CM | POA: Diagnosis not present

## 2020-06-10 DIAGNOSIS — I480 Paroxysmal atrial fibrillation: Secondary | ICD-10-CM | POA: Diagnosis not present

## 2020-06-10 DIAGNOSIS — M179 Osteoarthritis of knee, unspecified: Secondary | ICD-10-CM | POA: Diagnosis not present

## 2020-06-10 DIAGNOSIS — E1169 Type 2 diabetes mellitus with other specified complication: Secondary | ICD-10-CM | POA: Diagnosis not present

## 2020-06-10 DIAGNOSIS — R531 Weakness: Secondary | ICD-10-CM | POA: Diagnosis not present

## 2020-06-10 DIAGNOSIS — I129 Hypertensive chronic kidney disease with stage 1 through stage 4 chronic kidney disease, or unspecified chronic kidney disease: Secondary | ICD-10-CM | POA: Diagnosis not present

## 2020-06-10 DIAGNOSIS — E1122 Type 2 diabetes mellitus with diabetic chronic kidney disease: Secondary | ICD-10-CM | POA: Diagnosis not present

## 2020-06-11 DIAGNOSIS — I129 Hypertensive chronic kidney disease with stage 1 through stage 4 chronic kidney disease, or unspecified chronic kidney disease: Secondary | ICD-10-CM | POA: Diagnosis not present

## 2020-06-11 DIAGNOSIS — E1169 Type 2 diabetes mellitus with other specified complication: Secondary | ICD-10-CM | POA: Diagnosis not present

## 2020-06-11 DIAGNOSIS — Z4781 Encounter for orthopedic aftercare following surgical amputation: Secondary | ICD-10-CM | POA: Diagnosis not present

## 2020-06-11 DIAGNOSIS — E1122 Type 2 diabetes mellitus with diabetic chronic kidney disease: Secondary | ICD-10-CM | POA: Diagnosis not present

## 2020-06-11 DIAGNOSIS — N182 Chronic kidney disease, stage 2 (mild): Secondary | ICD-10-CM | POA: Diagnosis not present

## 2020-06-14 DIAGNOSIS — E1169 Type 2 diabetes mellitus with other specified complication: Secondary | ICD-10-CM | POA: Diagnosis not present

## 2020-06-14 DIAGNOSIS — N182 Chronic kidney disease, stage 2 (mild): Secondary | ICD-10-CM | POA: Diagnosis not present

## 2020-06-14 DIAGNOSIS — I129 Hypertensive chronic kidney disease with stage 1 through stage 4 chronic kidney disease, or unspecified chronic kidney disease: Secondary | ICD-10-CM | POA: Diagnosis not present

## 2020-06-14 DIAGNOSIS — E1122 Type 2 diabetes mellitus with diabetic chronic kidney disease: Secondary | ICD-10-CM | POA: Diagnosis not present

## 2020-06-14 DIAGNOSIS — Z4781 Encounter for orthopedic aftercare following surgical amputation: Secondary | ICD-10-CM | POA: Diagnosis not present

## 2020-06-16 DIAGNOSIS — E139 Other specified diabetes mellitus without complications: Secondary | ICD-10-CM | POA: Diagnosis not present

## 2020-06-16 DIAGNOSIS — L98499 Non-pressure chronic ulcer of skin of other sites with unspecified severity: Secondary | ICD-10-CM | POA: Diagnosis not present

## 2020-06-16 DIAGNOSIS — M1990 Unspecified osteoarthritis, unspecified site: Secondary | ICD-10-CM | POA: Diagnosis not present

## 2020-06-16 DIAGNOSIS — L959 Vasculitis limited to the skin, unspecified: Secondary | ICD-10-CM | POA: Diagnosis not present

## 2020-06-16 DIAGNOSIS — I7301 Raynaud's syndrome with gangrene: Secondary | ICD-10-CM | POA: Diagnosis not present

## 2020-06-16 DIAGNOSIS — M6281 Muscle weakness (generalized): Secondary | ICD-10-CM | POA: Diagnosis not present

## 2020-06-17 DIAGNOSIS — M25551 Pain in right hip: Secondary | ICD-10-CM | POA: Diagnosis not present

## 2020-06-17 DIAGNOSIS — M1712 Unilateral primary osteoarthritis, left knee: Secondary | ICD-10-CM | POA: Diagnosis not present

## 2020-06-17 DIAGNOSIS — M1711 Unilateral primary osteoarthritis, right knee: Secondary | ICD-10-CM | POA: Diagnosis not present

## 2020-06-23 DIAGNOSIS — N182 Chronic kidney disease, stage 2 (mild): Secondary | ICD-10-CM | POA: Diagnosis not present

## 2020-06-23 DIAGNOSIS — E1122 Type 2 diabetes mellitus with diabetic chronic kidney disease: Secondary | ICD-10-CM | POA: Diagnosis not present

## 2020-06-23 DIAGNOSIS — E1169 Type 2 diabetes mellitus with other specified complication: Secondary | ICD-10-CM | POA: Diagnosis not present

## 2020-06-23 DIAGNOSIS — Z4781 Encounter for orthopedic aftercare following surgical amputation: Secondary | ICD-10-CM | POA: Diagnosis not present

## 2020-06-23 DIAGNOSIS — I129 Hypertensive chronic kidney disease with stage 1 through stage 4 chronic kidney disease, or unspecified chronic kidney disease: Secondary | ICD-10-CM | POA: Diagnosis not present

## 2020-06-30 DIAGNOSIS — I129 Hypertensive chronic kidney disease with stage 1 through stage 4 chronic kidney disease, or unspecified chronic kidney disease: Secondary | ICD-10-CM | POA: Diagnosis not present

## 2020-06-30 DIAGNOSIS — E1122 Type 2 diabetes mellitus with diabetic chronic kidney disease: Secondary | ICD-10-CM | POA: Diagnosis not present

## 2020-06-30 DIAGNOSIS — N182 Chronic kidney disease, stage 2 (mild): Secondary | ICD-10-CM | POA: Diagnosis not present

## 2020-06-30 DIAGNOSIS — E1169 Type 2 diabetes mellitus with other specified complication: Secondary | ICD-10-CM | POA: Diagnosis not present

## 2020-06-30 DIAGNOSIS — Z4781 Encounter for orthopedic aftercare following surgical amputation: Secondary | ICD-10-CM | POA: Diagnosis not present

## 2020-07-07 DIAGNOSIS — N182 Chronic kidney disease, stage 2 (mild): Secondary | ICD-10-CM | POA: Diagnosis not present

## 2020-07-07 DIAGNOSIS — I129 Hypertensive chronic kidney disease with stage 1 through stage 4 chronic kidney disease, or unspecified chronic kidney disease: Secondary | ICD-10-CM | POA: Diagnosis not present

## 2020-07-07 DIAGNOSIS — E1122 Type 2 diabetes mellitus with diabetic chronic kidney disease: Secondary | ICD-10-CM | POA: Diagnosis not present

## 2020-07-07 DIAGNOSIS — Z4781 Encounter for orthopedic aftercare following surgical amputation: Secondary | ICD-10-CM | POA: Diagnosis not present

## 2020-07-07 DIAGNOSIS — E1169 Type 2 diabetes mellitus with other specified complication: Secondary | ICD-10-CM | POA: Diagnosis not present

## 2020-07-13 DIAGNOSIS — I129 Hypertensive chronic kidney disease with stage 1 through stage 4 chronic kidney disease, or unspecified chronic kidney disease: Secondary | ICD-10-CM | POA: Diagnosis not present

## 2020-07-13 DIAGNOSIS — M179 Osteoarthritis of knee, unspecified: Secondary | ICD-10-CM | POA: Diagnosis not present

## 2020-07-13 DIAGNOSIS — M4696 Unspecified inflammatory spondylopathy, lumbar region: Secondary | ICD-10-CM | POA: Diagnosis not present

## 2020-07-13 DIAGNOSIS — L98499 Non-pressure chronic ulcer of skin of other sites with unspecified severity: Secondary | ICD-10-CM | POA: Diagnosis not present

## 2020-07-13 DIAGNOSIS — R269 Unspecified abnormalities of gait and mobility: Secondary | ICD-10-CM | POA: Diagnosis not present

## 2020-07-14 DIAGNOSIS — N182 Chronic kidney disease, stage 2 (mild): Secondary | ICD-10-CM | POA: Diagnosis not present

## 2020-07-14 DIAGNOSIS — Z4781 Encounter for orthopedic aftercare following surgical amputation: Secondary | ICD-10-CM | POA: Diagnosis not present

## 2020-07-14 DIAGNOSIS — E1122 Type 2 diabetes mellitus with diabetic chronic kidney disease: Secondary | ICD-10-CM | POA: Diagnosis not present

## 2020-07-14 DIAGNOSIS — I129 Hypertensive chronic kidney disease with stage 1 through stage 4 chronic kidney disease, or unspecified chronic kidney disease: Secondary | ICD-10-CM | POA: Diagnosis not present

## 2020-07-14 DIAGNOSIS — E1169 Type 2 diabetes mellitus with other specified complication: Secondary | ICD-10-CM | POA: Diagnosis not present

## 2020-07-16 DIAGNOSIS — M6281 Muscle weakness (generalized): Secondary | ICD-10-CM | POA: Diagnosis not present

## 2020-07-21 DIAGNOSIS — E1122 Type 2 diabetes mellitus with diabetic chronic kidney disease: Secondary | ICD-10-CM | POA: Diagnosis not present

## 2020-07-21 DIAGNOSIS — Z4781 Encounter for orthopedic aftercare following surgical amputation: Secondary | ICD-10-CM | POA: Diagnosis not present

## 2020-07-21 DIAGNOSIS — N182 Chronic kidney disease, stage 2 (mild): Secondary | ICD-10-CM | POA: Diagnosis not present

## 2020-07-21 DIAGNOSIS — E1169 Type 2 diabetes mellitus with other specified complication: Secondary | ICD-10-CM | POA: Diagnosis not present

## 2020-07-21 DIAGNOSIS — I129 Hypertensive chronic kidney disease with stage 1 through stage 4 chronic kidney disease, or unspecified chronic kidney disease: Secondary | ICD-10-CM | POA: Diagnosis not present

## 2020-07-28 DIAGNOSIS — I129 Hypertensive chronic kidney disease with stage 1 through stage 4 chronic kidney disease, or unspecified chronic kidney disease: Secondary | ICD-10-CM | POA: Diagnosis not present

## 2020-07-28 DIAGNOSIS — E1169 Type 2 diabetes mellitus with other specified complication: Secondary | ICD-10-CM | POA: Diagnosis not present

## 2020-07-28 DIAGNOSIS — E1122 Type 2 diabetes mellitus with diabetic chronic kidney disease: Secondary | ICD-10-CM | POA: Diagnosis not present

## 2020-07-28 DIAGNOSIS — N182 Chronic kidney disease, stage 2 (mild): Secondary | ICD-10-CM | POA: Diagnosis not present

## 2020-07-28 DIAGNOSIS — Z4781 Encounter for orthopedic aftercare following surgical amputation: Secondary | ICD-10-CM | POA: Diagnosis not present

## 2020-08-03 DIAGNOSIS — I10 Essential (primary) hypertension: Secondary | ICD-10-CM | POA: Diagnosis not present

## 2020-08-03 DIAGNOSIS — I480 Paroxysmal atrial fibrillation: Secondary | ICD-10-CM | POA: Diagnosis not present

## 2020-08-03 DIAGNOSIS — E781 Pure hyperglyceridemia: Secondary | ICD-10-CM | POA: Diagnosis not present

## 2020-08-03 DIAGNOSIS — E1122 Type 2 diabetes mellitus with diabetic chronic kidney disease: Secondary | ICD-10-CM | POA: Diagnosis not present

## 2020-08-03 DIAGNOSIS — I129 Hypertensive chronic kidney disease with stage 1 through stage 4 chronic kidney disease, or unspecified chronic kidney disease: Secondary | ICD-10-CM | POA: Diagnosis not present

## 2020-08-03 DIAGNOSIS — N1831 Chronic kidney disease, stage 3a: Secondary | ICD-10-CM | POA: Diagnosis not present

## 2020-08-03 DIAGNOSIS — E1169 Type 2 diabetes mellitus with other specified complication: Secondary | ICD-10-CM | POA: Diagnosis not present

## 2020-08-03 DIAGNOSIS — G8929 Other chronic pain: Secondary | ICD-10-CM | POA: Diagnosis not present

## 2020-08-03 DIAGNOSIS — M179 Osteoarthritis of knee, unspecified: Secondary | ICD-10-CM | POA: Diagnosis not present

## 2020-08-04 DIAGNOSIS — E1122 Type 2 diabetes mellitus with diabetic chronic kidney disease: Secondary | ICD-10-CM | POA: Diagnosis not present

## 2020-08-04 DIAGNOSIS — N182 Chronic kidney disease, stage 2 (mild): Secondary | ICD-10-CM | POA: Diagnosis not present

## 2020-08-04 DIAGNOSIS — I129 Hypertensive chronic kidney disease with stage 1 through stage 4 chronic kidney disease, or unspecified chronic kidney disease: Secondary | ICD-10-CM | POA: Diagnosis not present

## 2020-08-04 DIAGNOSIS — Z4781 Encounter for orthopedic aftercare following surgical amputation: Secondary | ICD-10-CM | POA: Diagnosis not present

## 2020-08-04 DIAGNOSIS — E1169 Type 2 diabetes mellitus with other specified complication: Secondary | ICD-10-CM | POA: Diagnosis not present

## 2020-08-10 DIAGNOSIS — E1122 Type 2 diabetes mellitus with diabetic chronic kidney disease: Secondary | ICD-10-CM | POA: Diagnosis not present

## 2020-08-10 DIAGNOSIS — N182 Chronic kidney disease, stage 2 (mild): Secondary | ICD-10-CM | POA: Diagnosis not present

## 2020-08-10 DIAGNOSIS — I129 Hypertensive chronic kidney disease with stage 1 through stage 4 chronic kidney disease, or unspecified chronic kidney disease: Secondary | ICD-10-CM | POA: Diagnosis not present

## 2020-08-10 DIAGNOSIS — E1169 Type 2 diabetes mellitus with other specified complication: Secondary | ICD-10-CM | POA: Diagnosis not present

## 2020-08-10 DIAGNOSIS — Z4781 Encounter for orthopedic aftercare following surgical amputation: Secondary | ICD-10-CM | POA: Diagnosis not present

## 2020-08-16 DIAGNOSIS — Z4781 Encounter for orthopedic aftercare following surgical amputation: Secondary | ICD-10-CM | POA: Diagnosis not present

## 2020-08-16 DIAGNOSIS — M6281 Muscle weakness (generalized): Secondary | ICD-10-CM | POA: Diagnosis not present

## 2020-08-16 DIAGNOSIS — E1122 Type 2 diabetes mellitus with diabetic chronic kidney disease: Secondary | ICD-10-CM | POA: Diagnosis not present

## 2020-08-16 DIAGNOSIS — D631 Anemia in chronic kidney disease: Secondary | ICD-10-CM | POA: Diagnosis not present

## 2020-08-16 DIAGNOSIS — N182 Chronic kidney disease, stage 2 (mild): Secondary | ICD-10-CM | POA: Diagnosis not present

## 2020-08-16 DIAGNOSIS — I129 Hypertensive chronic kidney disease with stage 1 through stage 4 chronic kidney disease, or unspecified chronic kidney disease: Secondary | ICD-10-CM | POA: Diagnosis not present

## 2020-08-18 DIAGNOSIS — E1122 Type 2 diabetes mellitus with diabetic chronic kidney disease: Secondary | ICD-10-CM | POA: Diagnosis not present

## 2020-08-18 DIAGNOSIS — Z4781 Encounter for orthopedic aftercare following surgical amputation: Secondary | ICD-10-CM | POA: Diagnosis not present

## 2020-08-18 DIAGNOSIS — N182 Chronic kidney disease, stage 2 (mild): Secondary | ICD-10-CM | POA: Diagnosis not present

## 2020-08-18 DIAGNOSIS — D631 Anemia in chronic kidney disease: Secondary | ICD-10-CM | POA: Diagnosis not present

## 2020-08-18 DIAGNOSIS — I129 Hypertensive chronic kidney disease with stage 1 through stage 4 chronic kidney disease, or unspecified chronic kidney disease: Secondary | ICD-10-CM | POA: Diagnosis not present

## 2020-08-19 DIAGNOSIS — N182 Chronic kidney disease, stage 2 (mild): Secondary | ICD-10-CM | POA: Diagnosis not present

## 2020-08-19 DIAGNOSIS — I129 Hypertensive chronic kidney disease with stage 1 through stage 4 chronic kidney disease, or unspecified chronic kidney disease: Secondary | ICD-10-CM | POA: Diagnosis not present

## 2020-08-19 DIAGNOSIS — D631 Anemia in chronic kidney disease: Secondary | ICD-10-CM | POA: Diagnosis not present

## 2020-08-19 DIAGNOSIS — Z4781 Encounter for orthopedic aftercare following surgical amputation: Secondary | ICD-10-CM | POA: Diagnosis not present

## 2020-08-19 DIAGNOSIS — E1122 Type 2 diabetes mellitus with diabetic chronic kidney disease: Secondary | ICD-10-CM | POA: Diagnosis not present

## 2020-08-25 DIAGNOSIS — N182 Chronic kidney disease, stage 2 (mild): Secondary | ICD-10-CM | POA: Diagnosis not present

## 2020-08-25 DIAGNOSIS — Z4781 Encounter for orthopedic aftercare following surgical amputation: Secondary | ICD-10-CM | POA: Diagnosis not present

## 2020-08-25 DIAGNOSIS — D631 Anemia in chronic kidney disease: Secondary | ICD-10-CM | POA: Diagnosis not present

## 2020-08-25 DIAGNOSIS — E1122 Type 2 diabetes mellitus with diabetic chronic kidney disease: Secondary | ICD-10-CM | POA: Diagnosis not present

## 2020-08-25 DIAGNOSIS — I129 Hypertensive chronic kidney disease with stage 1 through stage 4 chronic kidney disease, or unspecified chronic kidney disease: Secondary | ICD-10-CM | POA: Diagnosis not present

## 2020-08-27 DIAGNOSIS — Z4781 Encounter for orthopedic aftercare following surgical amputation: Secondary | ICD-10-CM | POA: Diagnosis not present

## 2020-08-27 DIAGNOSIS — E1122 Type 2 diabetes mellitus with diabetic chronic kidney disease: Secondary | ICD-10-CM | POA: Diagnosis not present

## 2020-08-27 DIAGNOSIS — I129 Hypertensive chronic kidney disease with stage 1 through stage 4 chronic kidney disease, or unspecified chronic kidney disease: Secondary | ICD-10-CM | POA: Diagnosis not present

## 2020-08-27 DIAGNOSIS — N182 Chronic kidney disease, stage 2 (mild): Secondary | ICD-10-CM | POA: Diagnosis not present

## 2020-08-27 DIAGNOSIS — D631 Anemia in chronic kidney disease: Secondary | ICD-10-CM | POA: Diagnosis not present

## 2020-08-30 DIAGNOSIS — I129 Hypertensive chronic kidney disease with stage 1 through stage 4 chronic kidney disease, or unspecified chronic kidney disease: Secondary | ICD-10-CM | POA: Diagnosis not present

## 2020-08-30 DIAGNOSIS — E1122 Type 2 diabetes mellitus with diabetic chronic kidney disease: Secondary | ICD-10-CM | POA: Diagnosis not present

## 2020-08-30 DIAGNOSIS — N182 Chronic kidney disease, stage 2 (mild): Secondary | ICD-10-CM | POA: Diagnosis not present

## 2020-08-30 DIAGNOSIS — Z4781 Encounter for orthopedic aftercare following surgical amputation: Secondary | ICD-10-CM | POA: Diagnosis not present

## 2020-08-30 DIAGNOSIS — D631 Anemia in chronic kidney disease: Secondary | ICD-10-CM | POA: Diagnosis not present

## 2020-09-03 DIAGNOSIS — E1122 Type 2 diabetes mellitus with diabetic chronic kidney disease: Secondary | ICD-10-CM | POA: Diagnosis not present

## 2020-09-03 DIAGNOSIS — Z4781 Encounter for orthopedic aftercare following surgical amputation: Secondary | ICD-10-CM | POA: Diagnosis not present

## 2020-09-03 DIAGNOSIS — D631 Anemia in chronic kidney disease: Secondary | ICD-10-CM | POA: Diagnosis not present

## 2020-09-03 DIAGNOSIS — N182 Chronic kidney disease, stage 2 (mild): Secondary | ICD-10-CM | POA: Diagnosis not present

## 2020-09-03 DIAGNOSIS — I129 Hypertensive chronic kidney disease with stage 1 through stage 4 chronic kidney disease, or unspecified chronic kidney disease: Secondary | ICD-10-CM | POA: Diagnosis not present

## 2020-09-06 DIAGNOSIS — D631 Anemia in chronic kidney disease: Secondary | ICD-10-CM | POA: Diagnosis not present

## 2020-09-06 DIAGNOSIS — N182 Chronic kidney disease, stage 2 (mild): Secondary | ICD-10-CM | POA: Diagnosis not present

## 2020-09-06 DIAGNOSIS — E1122 Type 2 diabetes mellitus with diabetic chronic kidney disease: Secondary | ICD-10-CM | POA: Diagnosis not present

## 2020-09-06 DIAGNOSIS — I129 Hypertensive chronic kidney disease with stage 1 through stage 4 chronic kidney disease, or unspecified chronic kidney disease: Secondary | ICD-10-CM | POA: Diagnosis not present

## 2020-09-06 DIAGNOSIS — Z4781 Encounter for orthopedic aftercare following surgical amputation: Secondary | ICD-10-CM | POA: Diagnosis not present

## 2020-09-08 DIAGNOSIS — I129 Hypertensive chronic kidney disease with stage 1 through stage 4 chronic kidney disease, or unspecified chronic kidney disease: Secondary | ICD-10-CM | POA: Diagnosis not present

## 2020-09-08 DIAGNOSIS — I10 Essential (primary) hypertension: Secondary | ICD-10-CM | POA: Diagnosis not present

## 2020-09-08 DIAGNOSIS — Z4781 Encounter for orthopedic aftercare following surgical amputation: Secondary | ICD-10-CM | POA: Diagnosis not present

## 2020-09-08 DIAGNOSIS — E1122 Type 2 diabetes mellitus with diabetic chronic kidney disease: Secondary | ICD-10-CM | POA: Diagnosis not present

## 2020-09-08 DIAGNOSIS — M179 Osteoarthritis of knee, unspecified: Secondary | ICD-10-CM | POA: Diagnosis not present

## 2020-09-08 DIAGNOSIS — N1831 Chronic kidney disease, stage 3a: Secondary | ICD-10-CM | POA: Diagnosis not present

## 2020-09-08 DIAGNOSIS — I480 Paroxysmal atrial fibrillation: Secondary | ICD-10-CM | POA: Diagnosis not present

## 2020-09-08 DIAGNOSIS — E1169 Type 2 diabetes mellitus with other specified complication: Secondary | ICD-10-CM | POA: Diagnosis not present

## 2020-09-08 DIAGNOSIS — D631 Anemia in chronic kidney disease: Secondary | ICD-10-CM | POA: Diagnosis not present

## 2020-09-08 DIAGNOSIS — E781 Pure hyperglyceridemia: Secondary | ICD-10-CM | POA: Diagnosis not present

## 2020-09-08 DIAGNOSIS — G8929 Other chronic pain: Secondary | ICD-10-CM | POA: Diagnosis not present

## 2020-09-08 DIAGNOSIS — N182 Chronic kidney disease, stage 2 (mild): Secondary | ICD-10-CM | POA: Diagnosis not present

## 2020-09-15 DIAGNOSIS — Z4781 Encounter for orthopedic aftercare following surgical amputation: Secondary | ICD-10-CM | POA: Diagnosis not present

## 2020-09-15 DIAGNOSIS — E1122 Type 2 diabetes mellitus with diabetic chronic kidney disease: Secondary | ICD-10-CM | POA: Diagnosis not present

## 2020-09-15 DIAGNOSIS — D631 Anemia in chronic kidney disease: Secondary | ICD-10-CM | POA: Diagnosis not present

## 2020-09-15 DIAGNOSIS — N182 Chronic kidney disease, stage 2 (mild): Secondary | ICD-10-CM | POA: Diagnosis not present

## 2020-09-15 DIAGNOSIS — I129 Hypertensive chronic kidney disease with stage 1 through stage 4 chronic kidney disease, or unspecified chronic kidney disease: Secondary | ICD-10-CM | POA: Diagnosis not present

## 2020-09-16 DIAGNOSIS — M6281 Muscle weakness (generalized): Secondary | ICD-10-CM | POA: Diagnosis not present

## 2020-09-17 DIAGNOSIS — D631 Anemia in chronic kidney disease: Secondary | ICD-10-CM | POA: Diagnosis not present

## 2020-09-17 DIAGNOSIS — I129 Hypertensive chronic kidney disease with stage 1 through stage 4 chronic kidney disease, or unspecified chronic kidney disease: Secondary | ICD-10-CM | POA: Diagnosis not present

## 2020-09-17 DIAGNOSIS — E1122 Type 2 diabetes mellitus with diabetic chronic kidney disease: Secondary | ICD-10-CM | POA: Diagnosis not present

## 2020-09-17 DIAGNOSIS — N182 Chronic kidney disease, stage 2 (mild): Secondary | ICD-10-CM | POA: Diagnosis not present

## 2020-09-17 DIAGNOSIS — Z4781 Encounter for orthopedic aftercare following surgical amputation: Secondary | ICD-10-CM | POA: Diagnosis not present

## 2020-09-22 DIAGNOSIS — Z4781 Encounter for orthopedic aftercare following surgical amputation: Secondary | ICD-10-CM | POA: Diagnosis not present

## 2020-09-22 DIAGNOSIS — N182 Chronic kidney disease, stage 2 (mild): Secondary | ICD-10-CM | POA: Diagnosis not present

## 2020-09-22 DIAGNOSIS — I129 Hypertensive chronic kidney disease with stage 1 through stage 4 chronic kidney disease, or unspecified chronic kidney disease: Secondary | ICD-10-CM | POA: Diagnosis not present

## 2020-09-22 DIAGNOSIS — D631 Anemia in chronic kidney disease: Secondary | ICD-10-CM | POA: Diagnosis not present

## 2020-09-22 DIAGNOSIS — E1122 Type 2 diabetes mellitus with diabetic chronic kidney disease: Secondary | ICD-10-CM | POA: Diagnosis not present

## 2020-09-27 DIAGNOSIS — N182 Chronic kidney disease, stage 2 (mild): Secondary | ICD-10-CM | POA: Diagnosis not present

## 2020-09-27 DIAGNOSIS — Z4781 Encounter for orthopedic aftercare following surgical amputation: Secondary | ICD-10-CM | POA: Diagnosis not present

## 2020-09-27 DIAGNOSIS — I129 Hypertensive chronic kidney disease with stage 1 through stage 4 chronic kidney disease, or unspecified chronic kidney disease: Secondary | ICD-10-CM | POA: Diagnosis not present

## 2020-09-27 DIAGNOSIS — D631 Anemia in chronic kidney disease: Secondary | ICD-10-CM | POA: Diagnosis not present

## 2020-09-27 DIAGNOSIS — E1122 Type 2 diabetes mellitus with diabetic chronic kidney disease: Secondary | ICD-10-CM | POA: Diagnosis not present

## 2020-09-27 DIAGNOSIS — E1169 Type 2 diabetes mellitus with other specified complication: Secondary | ICD-10-CM | POA: Diagnosis not present

## 2020-09-27 DIAGNOSIS — E781 Pure hyperglyceridemia: Secondary | ICD-10-CM | POA: Diagnosis not present

## 2020-09-27 DIAGNOSIS — N1831 Chronic kidney disease, stage 3a: Secondary | ICD-10-CM | POA: Diagnosis not present

## 2020-09-27 DIAGNOSIS — I10 Essential (primary) hypertension: Secondary | ICD-10-CM | POA: Diagnosis not present

## 2020-09-27 DIAGNOSIS — I480 Paroxysmal atrial fibrillation: Secondary | ICD-10-CM | POA: Diagnosis not present

## 2020-09-27 DIAGNOSIS — G8929 Other chronic pain: Secondary | ICD-10-CM | POA: Diagnosis not present

## 2020-10-01 DIAGNOSIS — D631 Anemia in chronic kidney disease: Secondary | ICD-10-CM | POA: Diagnosis not present

## 2020-10-01 DIAGNOSIS — E1122 Type 2 diabetes mellitus with diabetic chronic kidney disease: Secondary | ICD-10-CM | POA: Diagnosis not present

## 2020-10-01 DIAGNOSIS — N182 Chronic kidney disease, stage 2 (mild): Secondary | ICD-10-CM | POA: Diagnosis not present

## 2020-10-01 DIAGNOSIS — Z4781 Encounter for orthopedic aftercare following surgical amputation: Secondary | ICD-10-CM | POA: Diagnosis not present

## 2020-10-01 DIAGNOSIS — I129 Hypertensive chronic kidney disease with stage 1 through stage 4 chronic kidney disease, or unspecified chronic kidney disease: Secondary | ICD-10-CM | POA: Diagnosis not present

## 2020-10-04 DIAGNOSIS — D631 Anemia in chronic kidney disease: Secondary | ICD-10-CM | POA: Diagnosis not present

## 2020-10-04 DIAGNOSIS — E1122 Type 2 diabetes mellitus with diabetic chronic kidney disease: Secondary | ICD-10-CM | POA: Diagnosis not present

## 2020-10-04 DIAGNOSIS — Z4781 Encounter for orthopedic aftercare following surgical amputation: Secondary | ICD-10-CM | POA: Diagnosis not present

## 2020-10-04 DIAGNOSIS — I129 Hypertensive chronic kidney disease with stage 1 through stage 4 chronic kidney disease, or unspecified chronic kidney disease: Secondary | ICD-10-CM | POA: Diagnosis not present

## 2020-10-04 DIAGNOSIS — N182 Chronic kidney disease, stage 2 (mild): Secondary | ICD-10-CM | POA: Diagnosis not present

## 2020-10-16 DIAGNOSIS — M6281 Muscle weakness (generalized): Secondary | ICD-10-CM | POA: Diagnosis not present

## 2020-10-21 DIAGNOSIS — Z0000 Encounter for general adult medical examination without abnormal findings: Secondary | ICD-10-CM | POA: Diagnosis not present

## 2020-10-21 DIAGNOSIS — E1122 Type 2 diabetes mellitus with diabetic chronic kidney disease: Secondary | ICD-10-CM | POA: Diagnosis not present

## 2020-10-21 DIAGNOSIS — I480 Paroxysmal atrial fibrillation: Secondary | ICD-10-CM | POA: Diagnosis not present

## 2020-10-21 DIAGNOSIS — Z1389 Encounter for screening for other disorder: Secondary | ICD-10-CM | POA: Diagnosis not present

## 2020-10-21 DIAGNOSIS — M179 Osteoarthritis of knee, unspecified: Secondary | ICD-10-CM | POA: Diagnosis not present

## 2020-10-21 DIAGNOSIS — M4696 Unspecified inflammatory spondylopathy, lumbar region: Secondary | ICD-10-CM | POA: Diagnosis not present

## 2020-10-21 DIAGNOSIS — I129 Hypertensive chronic kidney disease with stage 1 through stage 4 chronic kidney disease, or unspecified chronic kidney disease: Secondary | ICD-10-CM | POA: Diagnosis not present

## 2020-10-21 DIAGNOSIS — Z23 Encounter for immunization: Secondary | ICD-10-CM | POA: Diagnosis not present

## 2020-10-21 DIAGNOSIS — G8929 Other chronic pain: Secondary | ICD-10-CM | POA: Diagnosis not present

## 2020-11-16 DIAGNOSIS — M6281 Muscle weakness (generalized): Secondary | ICD-10-CM | POA: Diagnosis not present

## 2020-12-16 DIAGNOSIS — M6281 Muscle weakness (generalized): Secondary | ICD-10-CM | POA: Diagnosis not present

## 2021-01-16 DIAGNOSIS — M6281 Muscle weakness (generalized): Secondary | ICD-10-CM | POA: Diagnosis not present

## 2021-02-08 DIAGNOSIS — H401122 Primary open-angle glaucoma, left eye, moderate stage: Secondary | ICD-10-CM | POA: Diagnosis not present

## 2021-02-08 DIAGNOSIS — Z961 Presence of intraocular lens: Secondary | ICD-10-CM | POA: Diagnosis not present

## 2021-02-08 DIAGNOSIS — E119 Type 2 diabetes mellitus without complications: Secondary | ICD-10-CM | POA: Diagnosis not present

## 2021-02-08 DIAGNOSIS — H401113 Primary open-angle glaucoma, right eye, severe stage: Secondary | ICD-10-CM | POA: Diagnosis not present

## 2021-02-09 DIAGNOSIS — H401122 Primary open-angle glaucoma, left eye, moderate stage: Secondary | ICD-10-CM | POA: Diagnosis not present

## 2021-02-09 DIAGNOSIS — Z961 Presence of intraocular lens: Secondary | ICD-10-CM | POA: Diagnosis not present

## 2021-02-09 DIAGNOSIS — H401113 Primary open-angle glaucoma, right eye, severe stage: Secondary | ICD-10-CM | POA: Diagnosis not present

## 2021-02-09 DIAGNOSIS — E119 Type 2 diabetes mellitus without complications: Secondary | ICD-10-CM | POA: Diagnosis not present

## 2021-02-16 DIAGNOSIS — E1122 Type 2 diabetes mellitus with diabetic chronic kidney disease: Secondary | ICD-10-CM | POA: Diagnosis not present

## 2021-02-16 DIAGNOSIS — I10 Essential (primary) hypertension: Secondary | ICD-10-CM | POA: Diagnosis not present

## 2021-02-16 DIAGNOSIS — I129 Hypertensive chronic kidney disease with stage 1 through stage 4 chronic kidney disease, or unspecified chronic kidney disease: Secondary | ICD-10-CM | POA: Diagnosis not present

## 2021-02-16 DIAGNOSIS — G8929 Other chronic pain: Secondary | ICD-10-CM | POA: Diagnosis not present

## 2021-02-25 ENCOUNTER — Inpatient Hospital Stay (HOSPITAL_COMMUNITY)
Admission: EM | Admit: 2021-02-25 | Discharge: 2021-02-27 | DRG: 689 | Disposition: A | Discharge status: Home / Self Care | Payer: Medicare Other | Attending: Family Medicine | Admitting: Family Medicine

## 2021-02-25 ENCOUNTER — Observation Stay (HOSPITAL_COMMUNITY): Payer: Medicare Other

## 2021-02-25 ENCOUNTER — Emergency Department (HOSPITAL_COMMUNITY): Payer: Medicare Other

## 2021-02-25 DIAGNOSIS — E861 Hypovolemia: Secondary | ICD-10-CM | POA: Diagnosis present

## 2021-02-25 DIAGNOSIS — Z7901 Long term (current) use of anticoagulants: Secondary | ICD-10-CM

## 2021-02-25 DIAGNOSIS — G9340 Encephalopathy, unspecified: Secondary | ICD-10-CM | POA: Diagnosis present

## 2021-02-25 DIAGNOSIS — R32 Unspecified urinary incontinence: Secondary | ICD-10-CM | POA: Diagnosis present

## 2021-02-25 DIAGNOSIS — R4182 Altered mental status, unspecified: Principal | ICD-10-CM

## 2021-02-25 DIAGNOSIS — I129 Hypertensive chronic kidney disease with stage 1 through stage 4 chronic kidney disease, or unspecified chronic kidney disease: Secondary | ICD-10-CM | POA: Diagnosis not present

## 2021-02-25 DIAGNOSIS — Z7984 Long term (current) use of oral hypoglycemic drugs: Secondary | ICD-10-CM

## 2021-02-25 DIAGNOSIS — E1142 Type 2 diabetes mellitus with diabetic polyneuropathy: Secondary | ICD-10-CM | POA: Diagnosis not present

## 2021-02-25 DIAGNOSIS — R159 Full incontinence of feces: Secondary | ICD-10-CM | POA: Diagnosis present

## 2021-02-25 DIAGNOSIS — Z886 Allergy status to analgesic agent status: Secondary | ICD-10-CM

## 2021-02-25 DIAGNOSIS — G9389 Other specified disorders of brain: Secondary | ICD-10-CM | POA: Diagnosis not present

## 2021-02-25 DIAGNOSIS — H409 Unspecified glaucoma: Secondary | ICD-10-CM | POA: Diagnosis not present

## 2021-02-25 DIAGNOSIS — E1122 Type 2 diabetes mellitus with diabetic chronic kidney disease: Secondary | ICD-10-CM | POA: Diagnosis not present

## 2021-02-25 DIAGNOSIS — M1710 Unilateral primary osteoarthritis, unspecified knee: Secondary | ICD-10-CM | POA: Diagnosis not present

## 2021-02-25 DIAGNOSIS — Z8673 Personal history of transient ischemic attack (TIA), and cerebral infarction without residual deficits: Secondary | ICD-10-CM | POA: Diagnosis not present

## 2021-02-25 DIAGNOSIS — I10 Essential (primary) hypertension: Secondary | ICD-10-CM | POA: Diagnosis not present

## 2021-02-25 DIAGNOSIS — Z20822 Contact with and (suspected) exposure to covid-19: Secondary | ICD-10-CM | POA: Diagnosis present

## 2021-02-25 DIAGNOSIS — E1169 Type 2 diabetes mellitus with other specified complication: Secondary | ICD-10-CM | POA: Diagnosis present

## 2021-02-25 DIAGNOSIS — Z888 Allergy status to other drugs, medicaments and biological substances status: Secondary | ICD-10-CM

## 2021-02-25 DIAGNOSIS — I499 Cardiac arrhythmia, unspecified: Secondary | ICD-10-CM | POA: Diagnosis not present

## 2021-02-25 DIAGNOSIS — I480 Paroxysmal atrial fibrillation: Secondary | ICD-10-CM | POA: Diagnosis not present

## 2021-02-25 DIAGNOSIS — G9341 Metabolic encephalopathy: Secondary | ICD-10-CM | POA: Diagnosis not present

## 2021-02-25 DIAGNOSIS — G8929 Other chronic pain: Secondary | ICD-10-CM | POA: Diagnosis present

## 2021-02-25 DIAGNOSIS — N1831 Chronic kidney disease, stage 3a: Secondary | ICD-10-CM | POA: Diagnosis not present

## 2021-02-25 DIAGNOSIS — G4733 Obstructive sleep apnea (adult) (pediatric): Secondary | ICD-10-CM | POA: Diagnosis present

## 2021-02-25 DIAGNOSIS — Z79899 Other long term (current) drug therapy: Secondary | ICD-10-CM

## 2021-02-25 DIAGNOSIS — N390 Urinary tract infection, site not specified: Secondary | ICD-10-CM | POA: Diagnosis not present

## 2021-02-25 DIAGNOSIS — E669 Obesity, unspecified: Secondary | ICD-10-CM | POA: Diagnosis present

## 2021-02-25 DIAGNOSIS — D72829 Elevated white blood cell count, unspecified: Secondary | ICD-10-CM | POA: Diagnosis present

## 2021-02-25 DIAGNOSIS — F5104 Psychophysiologic insomnia: Secondary | ICD-10-CM | POA: Insufficient documentation

## 2021-02-25 DIAGNOSIS — E781 Pure hyperglyceridemia: Secondary | ICD-10-CM | POA: Diagnosis present

## 2021-02-25 DIAGNOSIS — Z743 Need for continuous supervision: Secondary | ICD-10-CM | POA: Diagnosis not present

## 2021-02-25 DIAGNOSIS — H5461 Unqualified visual loss, right eye, normal vision left eye: Secondary | ICD-10-CM | POA: Diagnosis not present

## 2021-02-25 DIAGNOSIS — K760 Fatty (change of) liver, not elsewhere classified: Secondary | ICD-10-CM | POA: Diagnosis not present

## 2021-02-25 DIAGNOSIS — Z87891 Personal history of nicotine dependence: Secondary | ICD-10-CM

## 2021-02-25 DIAGNOSIS — R6889 Other general symptoms and signs: Secondary | ICD-10-CM | POA: Diagnosis not present

## 2021-02-25 DIAGNOSIS — E785 Hyperlipidemia, unspecified: Secondary | ICD-10-CM | POA: Diagnosis not present

## 2021-02-25 DIAGNOSIS — Z6836 Body mass index (BMI) 36.0-36.9, adult: Secondary | ICD-10-CM | POA: Diagnosis not present

## 2021-02-25 DIAGNOSIS — Z86010 Personal history of colonic polyps: Secondary | ICD-10-CM

## 2021-02-25 DIAGNOSIS — Z7401 Bed confinement status: Secondary | ICD-10-CM

## 2021-02-25 LAB — COMPREHENSIVE METABOLIC PANEL
ALT: 11 U/L (ref 0–44)
AST: 21 U/L (ref 15–41)
Albumin: 3 g/dL — ABNORMAL LOW (ref 3.5–5.0)
Alkaline Phosphatase: 87 U/L (ref 38–126)
Anion gap: 11 (ref 5–15)
BUN: 12 mg/dL (ref 8–23)
CO2: 17 mmol/L — ABNORMAL LOW (ref 22–32)
Calcium: 9.4 mg/dL (ref 8.9–10.3)
Chloride: 113 mmol/L — ABNORMAL HIGH (ref 98–111)
Creatinine, Ser: 1.48 mg/dL — ABNORMAL HIGH (ref 0.61–1.24)
GFR, Estimated: 47 mL/min — ABNORMAL LOW (ref >60)
Glucose, Bld: 144 mg/dL — ABNORMAL HIGH (ref 70–99)
Potassium: 4.1 mmol/L (ref 3.5–5.1)
Sodium: 141 mmol/L (ref 135–145)
Total Bilirubin: 0.5 mg/dL (ref 0.3–1.2)
Total Protein: 6.4 g/dL — ABNORMAL LOW (ref 6.5–8.1)

## 2021-02-25 LAB — CBC WITH DIFFERENTIAL/PLATELET
Abs Immature Granulocytes: 0.08 10*3/uL — ABNORMAL HIGH (ref 0.00–0.07)
Basophils Absolute: 0 10*3/uL (ref 0.0–0.1)
Basophils Relative: 0 %
Eosinophils Absolute: 1.3 10*3/uL — ABNORMAL HIGH (ref 0.0–0.5)
Eosinophils Relative: 11 %
HCT: 43.5 % (ref 39.0–52.0)
Hemoglobin: 13.9 g/dL (ref 13.0–17.0)
Immature Granulocytes: 1 %
Lymphocytes Relative: 18 %
Lymphs Abs: 2.1 10*3/uL (ref 0.7–4.0)
MCH: 30.3 pg (ref 26.0–34.0)
MCHC: 32 g/dL (ref 30.0–36.0)
MCV: 95 fL (ref 80.0–100.0)
Monocytes Absolute: 1 10*3/uL (ref 0.1–1.0)
Monocytes Relative: 8 %
Neutro Abs: 7.3 10*3/uL (ref 1.7–7.7)
Neutrophils Relative %: 62 %
Platelets: 215 10*3/uL (ref 150–400)
RBC: 4.58 MIL/uL (ref 4.22–5.81)
RDW: 14 % (ref 11.5–15.5)
WBC: 11.8 10*3/uL — ABNORMAL HIGH (ref 4.0–10.5)
nRBC: 0 % (ref 0.0–0.2)

## 2021-02-25 LAB — CBG MONITORING, ED: Glucose-Capillary: 123 mg/dL — ABNORMAL HIGH (ref 70–99)

## 2021-02-25 LAB — RESP PANEL BY RT-PCR (FLU A&B, COVID) ARPGX2
Influenza A by PCR: NEGATIVE
Influenza B by PCR: NEGATIVE
SARS Coronavirus 2 by RT PCR: NEGATIVE

## 2021-02-25 LAB — LACTIC ACID, PLASMA
Lactic Acid, Venous: 2.6 mmol/L (ref 0.5–1.9)
Lactic Acid, Venous: 1.7 mmol/L (ref 0.5–1.9)

## 2021-02-25 LAB — GLUCOSE, CAPILLARY: Glucose-Capillary: 129 mg/dL — ABNORMAL HIGH (ref 70–99)

## 2021-02-25 LAB — AMMONIA: Ammonia: 43 umol/L — ABNORMAL HIGH (ref 9–35)

## 2021-02-25 LAB — TSH: TSH: 0.589 u[IU]/mL (ref 0.350–4.500)

## 2021-02-25 LAB — VITAMIN B12: Vitamin B-12: 531 pg/mL (ref 180–914)

## 2021-02-25 IMAGING — CT CT HEAD W/O CM
4 series · 15 of 47 positions shown, 17 images · non-contrast
Comparison: CT and MRI [DATE].

CLINICAL DATA: Altered mental status, nontraumatic (Ped 0-17y)



[Series 3: head wo · axial · 0.43mm/px · z∈[-113,+7]mm · 7 of 33 slices shown, 9 images]
[im 5/33  brain]
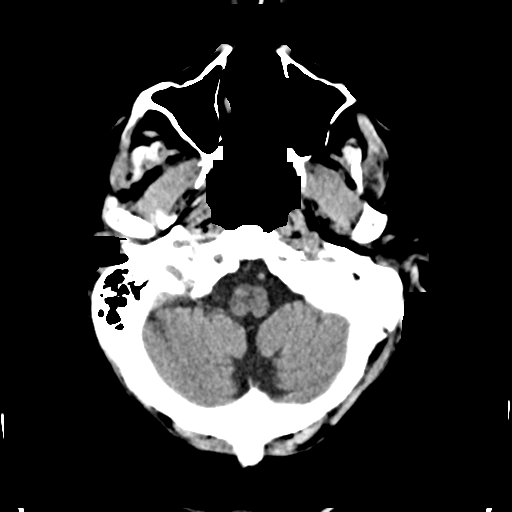
[im 5/33  bone]
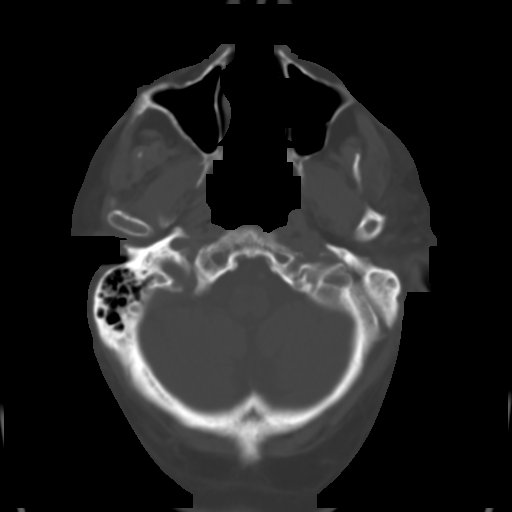
[im 9/33  brain]
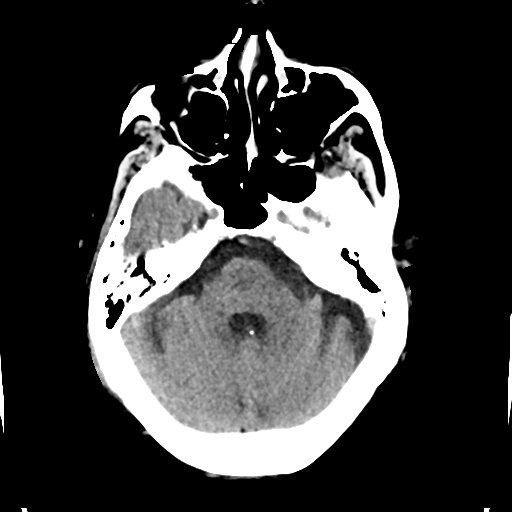
[im 13/33  brain]
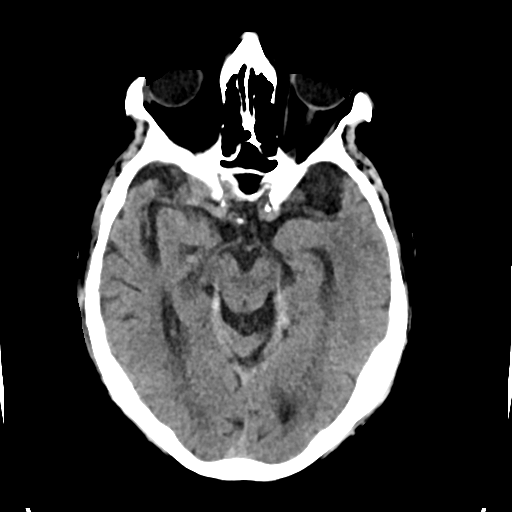
[im 17/33  brain]
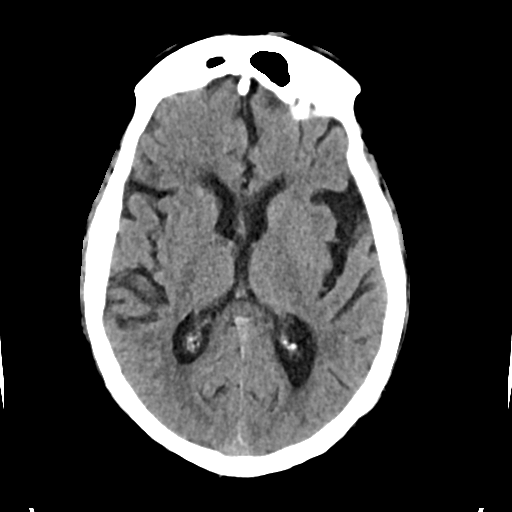
[im 21/33  brain]
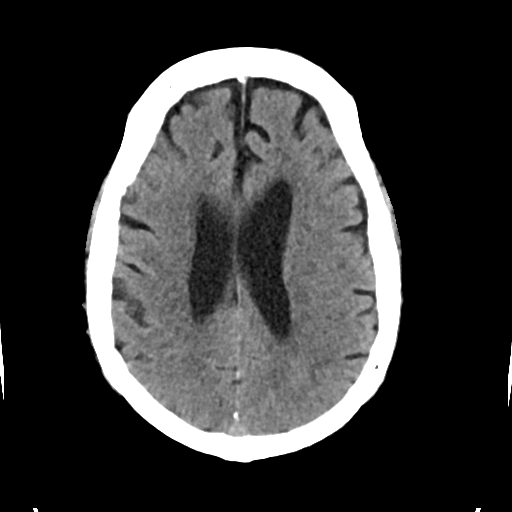
[im 21/33  bone]
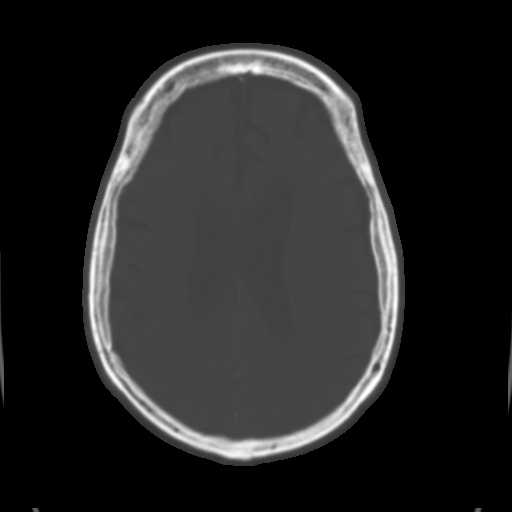
[im 25/33  brain]
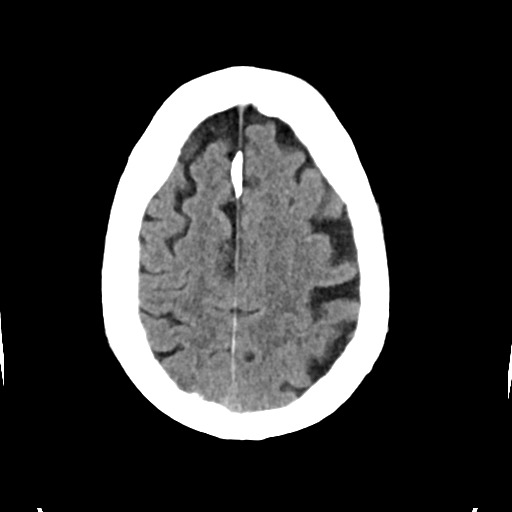
[im 29/33  brain]
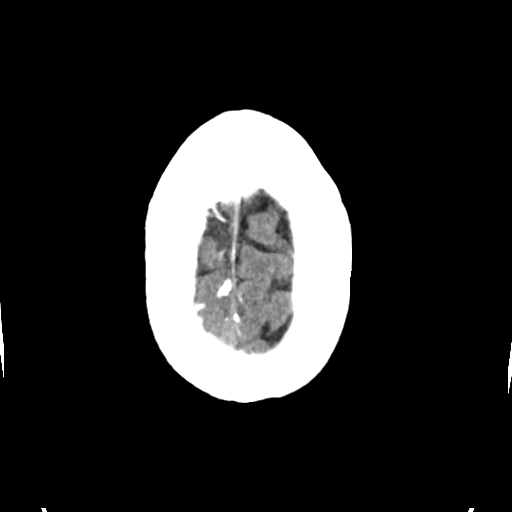

[Series 4: head bone · axial · 0.43mm/px · z∈[-117,-101]mm · 2 of 82 slices shown]
[im 9/82  bone]
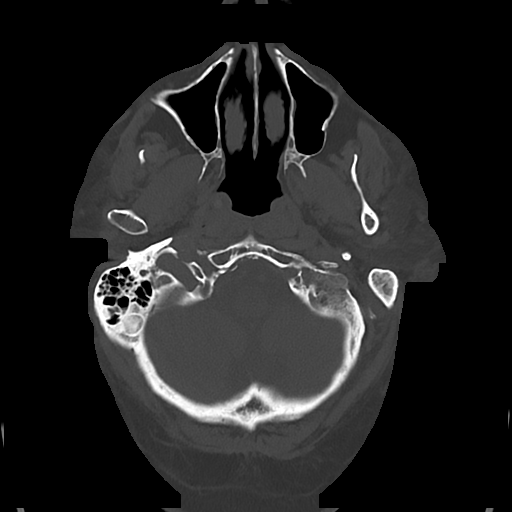
[im 17/82  bone]
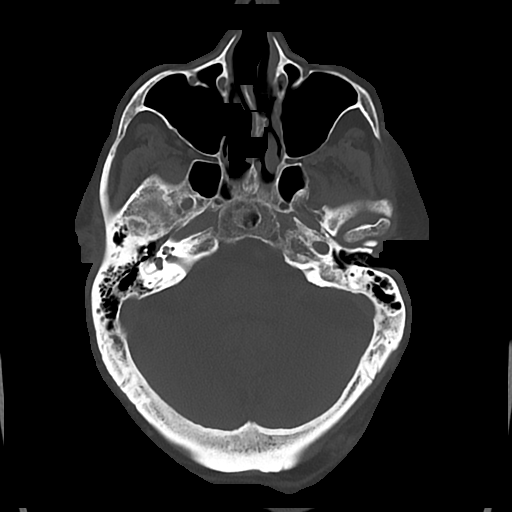

[Series 5: cor soft · coronal · 0.30mm/px · 3 of 73 slices shown]
[im 25/73  brain]
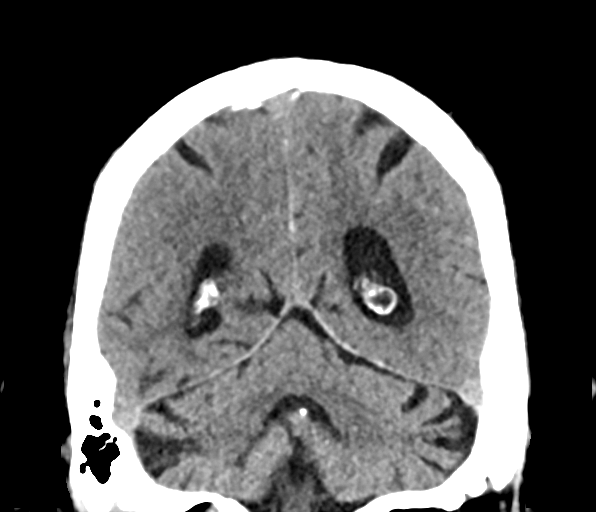
[im 33/73  brain]
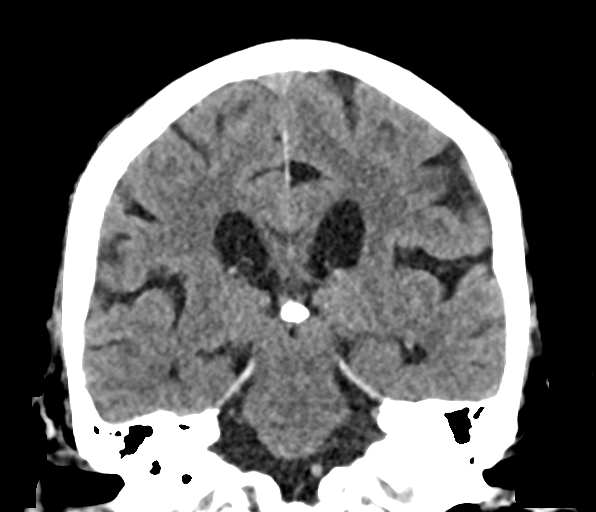
[im 41/73  brain]
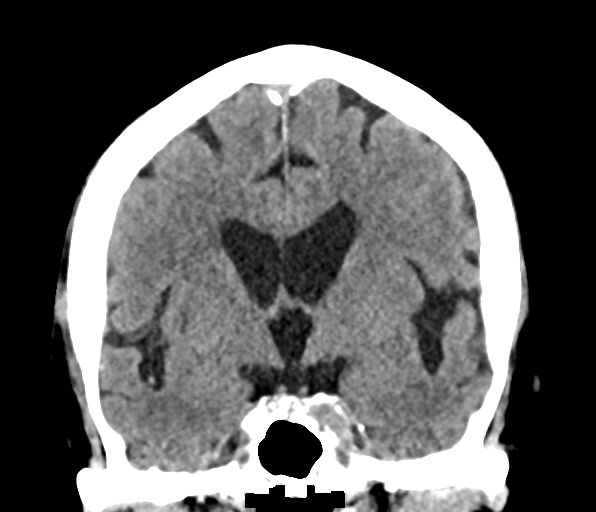

[Series 6: sag soft · sagittal · 0.29mm/px · 3 of 55 slices shown]
[im 19/55  brain]
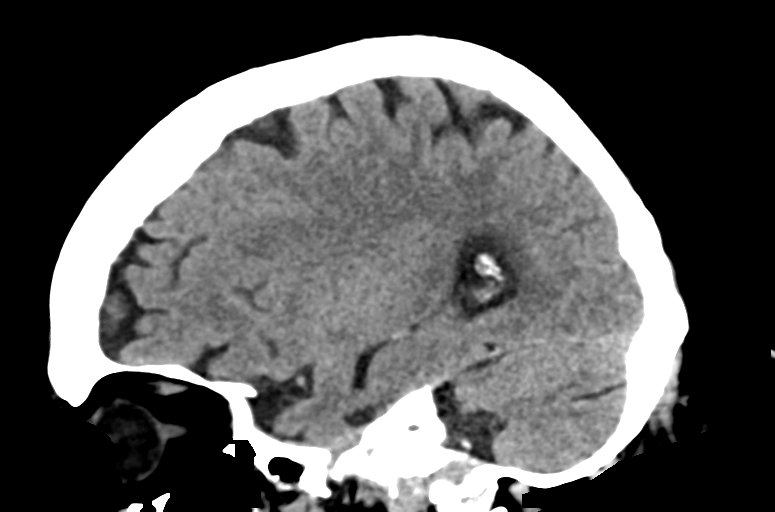
[im 28/55  brain]
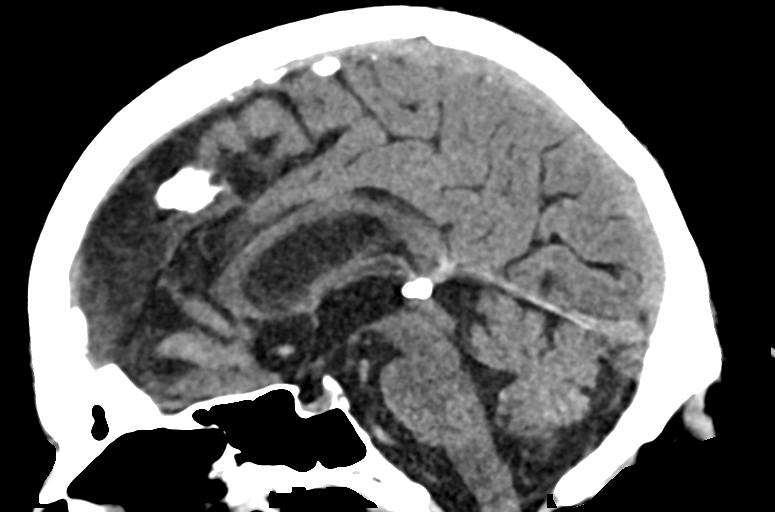
[im 37/55  brain]
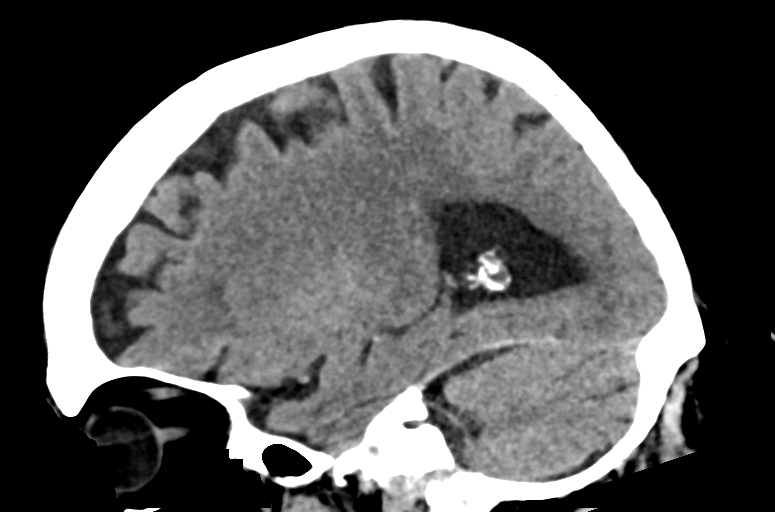

[15 of 47 positions shown; findings below may reference images not displayed]

FINDINGS: Brain: discrete area of encephalomalacia in the periventricular
right temporal lobe. No evidence of acute large vascular territory
infarction, hemorrhage, hydrocephalus, extra-axial collection or
mass lesion/mass effect. Small chronic cortical infarct in the left
frontal lobe. Similar cerebral atrophy with ex vacuo ventricular
dilation.

Vascular: No hyperdense vessel identified. Calcific intracranial
atherosclerosis.

Skull: No acute fracture.

Sinuses/Orbits: Clear sinuses.  Unremarkable orbits.

Other: No mastoid effusions.
IMPRESSION: 1. No definite acute abnormality.
2. New discrete area of encephalomalacia in the periventricular
right temporal lobe, compatible with interval but remote appearing
insult. MRI could further evaluate if clinically indicated.
3. Similar small cortical infarct in the left frontal lobe.

## 2021-02-25 IMAGING — MR MR HEAD W/O CM
8 of 10 series · 36 of 48 positions shown · non-contrast
Comparison: Same-day noncontrast CT head

CLINICAL DATA: Altered mental status, concern for stroke

EXAM:
MRI HEAD WITHOUT CONTRAST
TECHNIQUE: Multiplanar, multiecho pulse sequences of the brain and surrounding
structures were obtained without intravenous contrast.

[Series 3: DWI · axial · 3.0mm · 1.09mm/px · z∈[-88,+56]mm · 9 of 100 slices shown (1 of 4)]
[im 1/100]
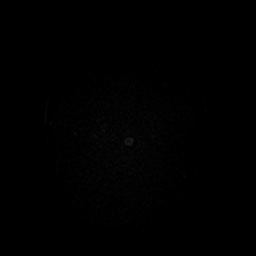
[im 13/100]
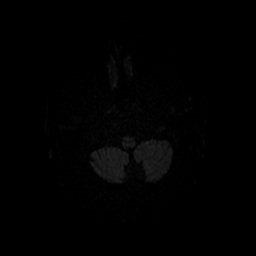
[im 25/100]
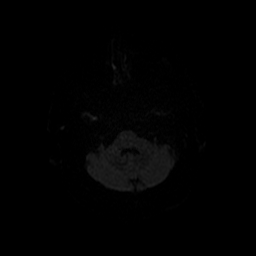
[im 38/100]
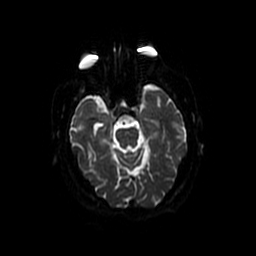
[im 50/100]
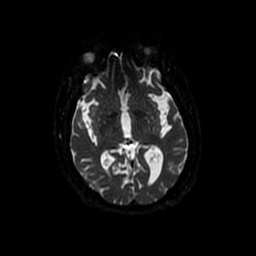
[im 62/100]
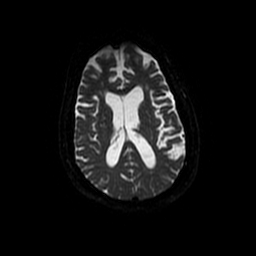
[im 75/100]
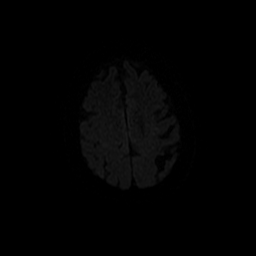
[im 87/100]
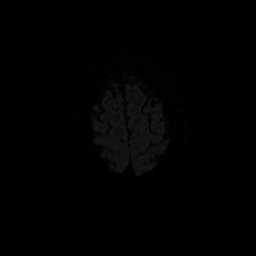
[im 100/100]
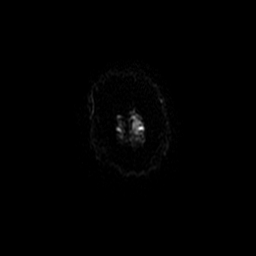

[Series 4: DWI · coronal · 5.0mm · 1.09mm/px · 7 of 70 slices shown (2 of 4)]
[im 1/70]
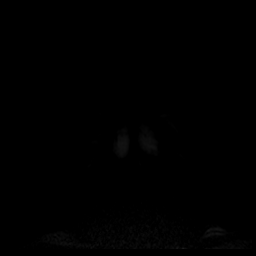
[im 12/70]
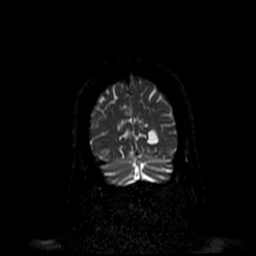
[im 24/70]
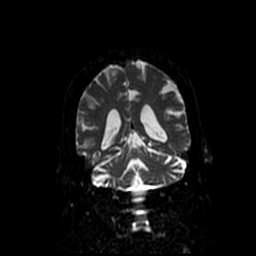
[im 35/70]
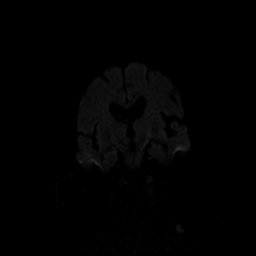
[im 47/70]
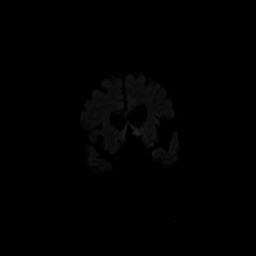
[im 58/70]
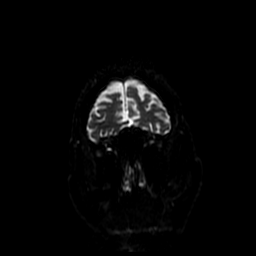
[im 70/70]
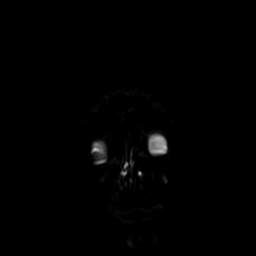

[Series 5: T1 · sagittal · 5.0mm · 0.47mm/px · 2 of 23 slices shown]
[im 1/23]
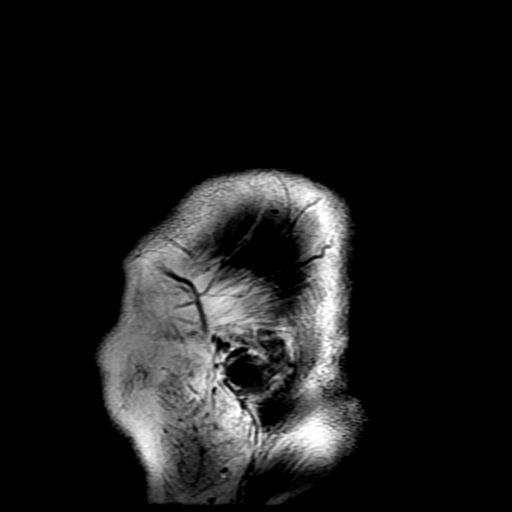
[im 23/23]
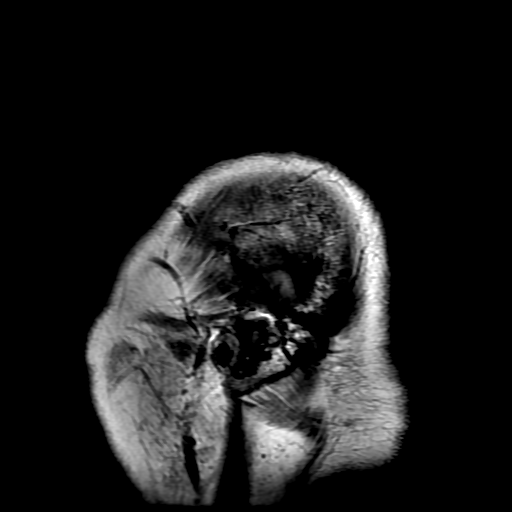

[Series 6: T2 · axial · 5.0mm · 0.43mm/px · z∈[-90,+51]mm · 3 of 25 slices shown (1 of 2)]
[im 1/25]
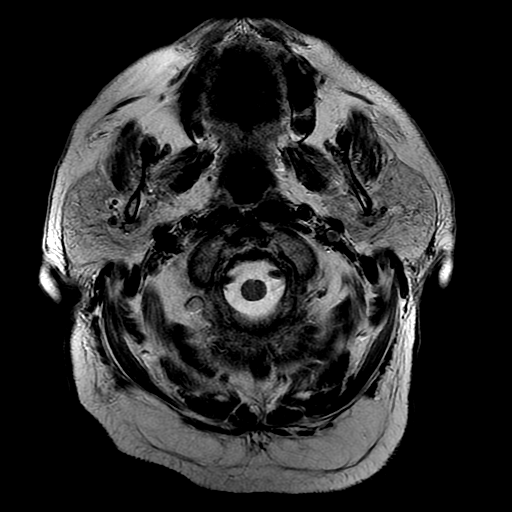
[im 13/25]
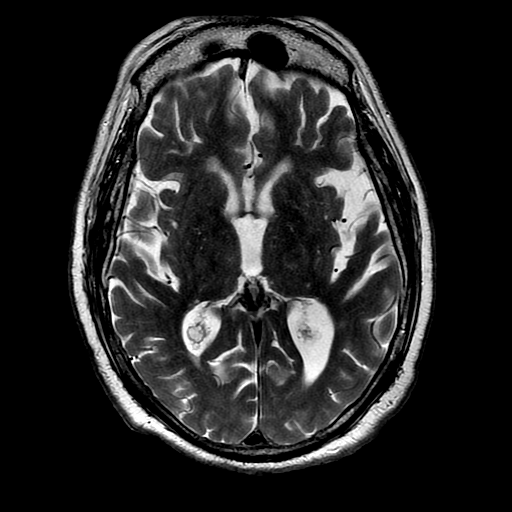
[im 25/25]
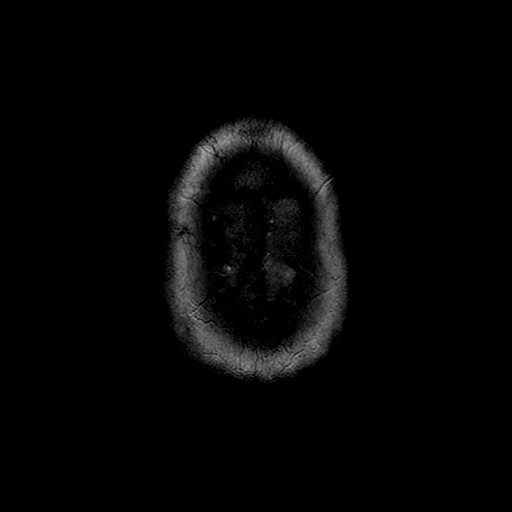

[Series 7: FLAIR · axial · 3.0mm · 0.43mm/px · z∈[-90,+51]mm · 3 of 25 slices shown]
[im 1/25]
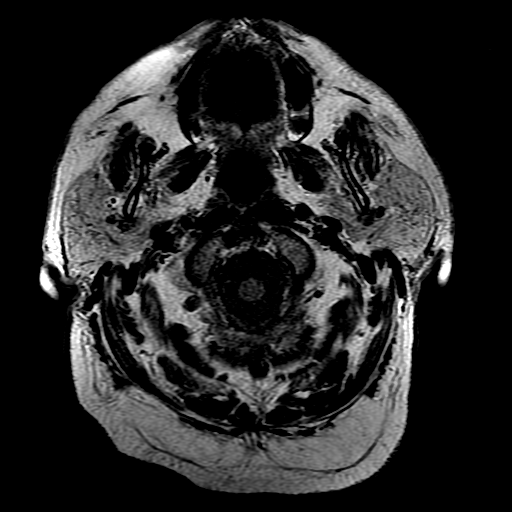
[im 13/25]
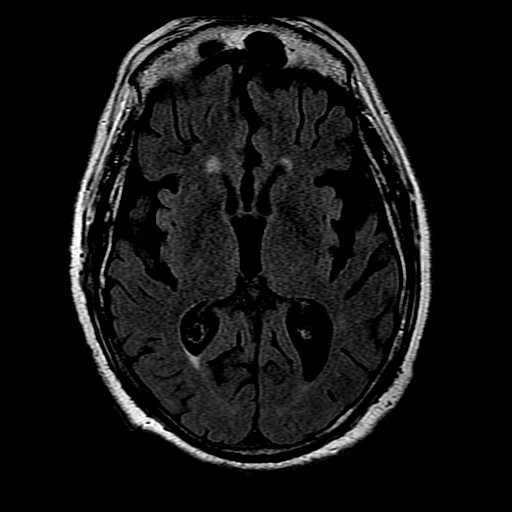
[im 25/25]
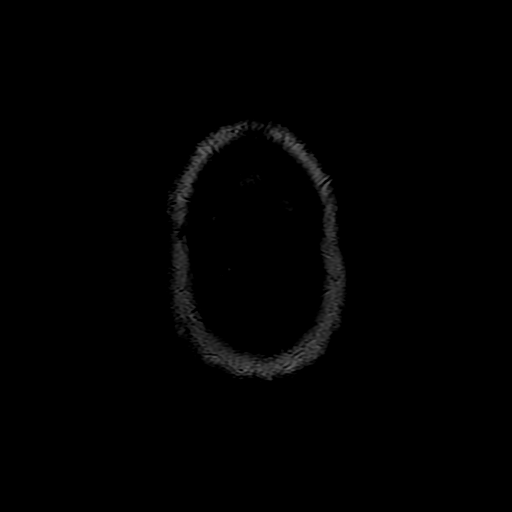

[Series 10: T2 · coronal · 5.0mm · 0.39mm/px · 3 of 27 slices shown (2 of 2)]
[im 1/27]
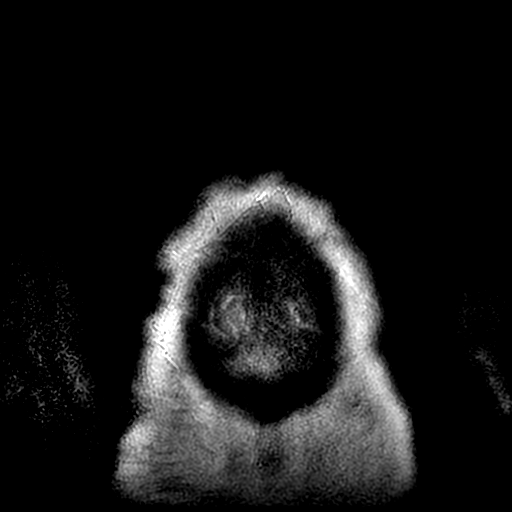
[im 14/27]
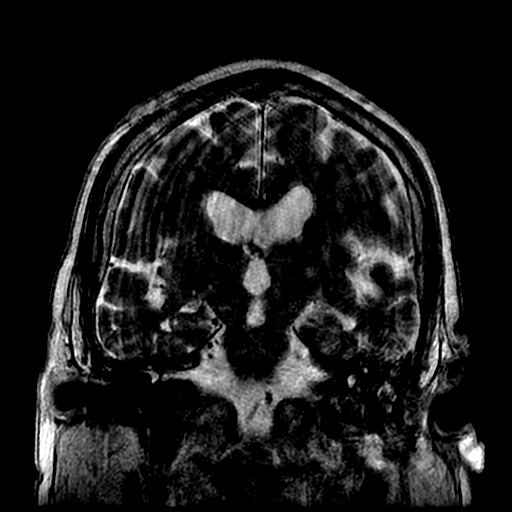
[im 27/27]
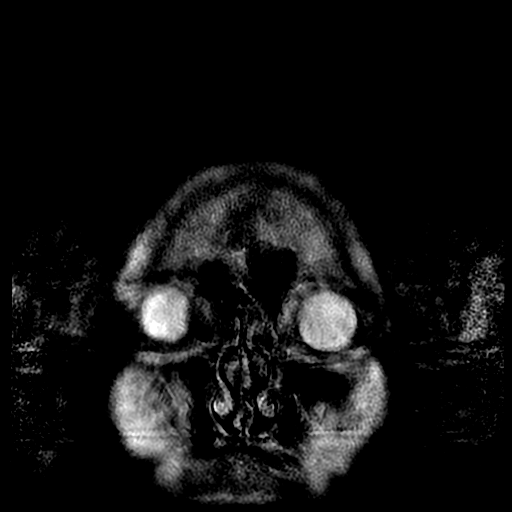

[Series 300: DWI · axial · 3.0mm · 1.09mm/px · z∈[-88,+56]mm · 5 of 50 slices shown (3 of 4)]
[im 1/50]
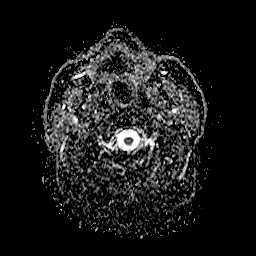
[im 13/50]
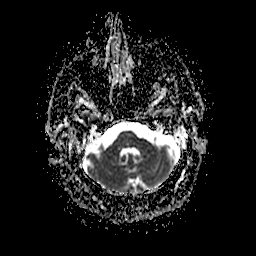
[im 25/50]
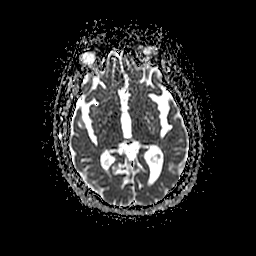
[im 37/50]
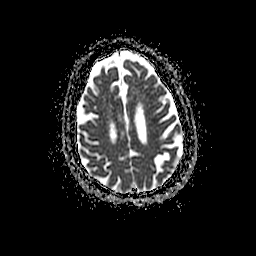
[im 50/50]
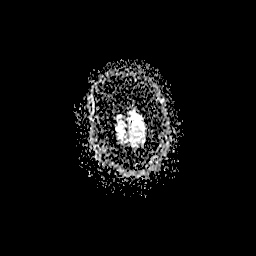

[Series 400: DWI · coronal · 5.0mm · 1.09mm/px · 4 of 35 slices shown (4 of 4)]
[im 1/35]
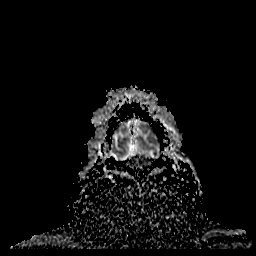
[im 12/35]
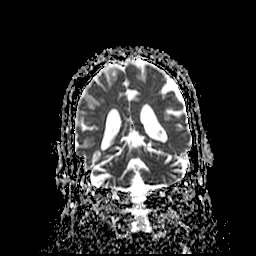
[im 23/35]
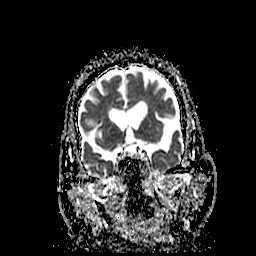
[im 35/35]
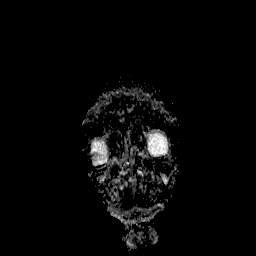

[36 of 48 positions shown; findings below may reference images not displayed]

FINDINGS: Brain: There is no evidence of acute intracranial hemorrhage,
extra-axial fluid collection, or acute infarct

There is mild global parenchymal volume loss with prominence of the
ventricular system and extra-axial CSF spaces. There is a small
remote cortical infarct in the left middle frontal gyrus (7-21).
There is an additional remote infarct in the right temporal lobe as
seen on the prior CT, new since the prior study from [DATE].
Otherwise, there is a mild burden of white matter signal abnormality
likely reflecting mild chronic white matter microangiopathy.

There is no mass lesion.  There is no midline shift.

Vascular: Normal flow voids.

Skull and upper cervical spine: Normal marrow signal.

Sinuses/Orbits: The paranasal sinuses are clear. Bilateral lens
implants are in place. The globes and orbits are otherwise
unremarkable.

Other: There are bilateral mastoid effusions.
IMPRESSION: 1. No acute intracranial pathology.
2. Small remote cortical infarcts in the left frontal lobe and right
temporal lobe on a background of mild parenchymal volume loss and
chronic white matter microangiopathy.
3. Bilateral mastoid effusions.

## 2021-02-25 MED ORDER — TRAZODONE HCL 50 MG PO TABS: 150.0000 mg | ORAL_TABLET | Freq: Every day | ORAL | Status: DC

## 2021-02-25 MED ORDER — ACETAMINOPHEN 325 MG PO TABS: 650.0000 mg | ORAL_TABLET | Freq: Four times a day (QID) | ORAL | Status: DC | PRN

## 2021-02-25 MED ORDER — APIXABAN 5 MG PO TABS
5.0000 mg | ORAL_TABLET | Freq: Two times a day (BID) | ORAL | Status: DC
Start: 2021-02-25 — End: 2021-02-27
  Administered 2021-02-26 – 2021-02-27 (×3): 5 mg via ORAL
  Filled 2021-02-25 (×4): qty 1

## 2021-02-25 MED ORDER — SODIUM CHLORIDE 0.9 % IV SOLN: INTRAVENOUS | Status: AC

## 2021-02-25 MED ORDER — NETARSUDIL-LATANOPROST 0.02-0.005 % OP SOLN
1.0000 [drp] | Freq: Every day | OPHTHALMIC | Status: DC
Start: 2021-02-25 — End: 2021-02-25

## 2021-02-25 MED ORDER — GABAPENTIN 300 MG PO CAPS
600.0000 mg | ORAL_CAPSULE | Freq: Two times a day (BID) | ORAL | Status: DC
Start: 2021-02-25 — End: 2021-02-27
  Administered 2021-02-26 – 2021-02-27 (×3): 600 mg via ORAL
  Filled 2021-02-25 (×4): qty 2

## 2021-02-25 MED ORDER — INSULIN ASPART 100 UNIT/ML IJ SOLN
0.0000 [IU] | Freq: Three times a day (TID) | INTRAMUSCULAR | Status: DC
  Administered 2021-02-26 (×2): 2 [IU] via SUBCUTANEOUS

## 2021-02-25 MED ORDER — OXYCODONE-ACETAMINOPHEN 5-325 MG PO TABS
1.0000 | ORAL_TABLET | Freq: Two times a day (BID) | ORAL | Status: DC
  Administered 2021-02-26 – 2021-02-27 (×3): 1 via ORAL
  Filled 2021-02-25 (×4): qty 1

## 2021-02-25 MED ORDER — CARVEDILOL 6.25 MG PO TABS
6.2500 mg | ORAL_TABLET | Freq: Two times a day (BID) | ORAL | Status: DC
  Administered 2021-02-26 – 2021-02-27 (×3): 6.25 mg via ORAL
  Filled 2021-02-25 (×3): qty 1

## 2021-02-25 MED ORDER — ADULT MULTIVITAMIN W/MINERALS CH
1.0000 | ORAL_TABLET | Freq: Every day | ORAL | Status: DC
  Administered 2021-02-26 – 2021-02-27 (×2): 1 via ORAL
  Filled 2021-02-25 (×2): qty 1

## 2021-02-25 MED ORDER — DULOXETINE HCL 60 MG PO CPEP
60.0000 mg | ORAL_CAPSULE | Freq: Every day | ORAL | Status: DC
  Administered 2021-02-26 – 2021-02-27 (×2): 60 mg via ORAL
  Filled 2021-02-25 (×2): qty 1

## 2021-02-25 MED ORDER — ACETAMINOPHEN 650 MG RE SUPP
650.0000 mg | Freq: Four times a day (QID) | RECTAL | Status: DC | PRN
Start: 2021-02-25 — End: 2021-02-27

## 2021-02-25 MED ORDER — LATANOPROST 0.005 % OP SOLN
1.0000 [drp] | Freq: Every day | OPHTHALMIC | Status: DC
  Administered 2021-02-26: 1 [drp] via OPHTHALMIC
  Filled 2021-02-25 (×2): qty 2.5

## 2021-02-25 MED ORDER — TIMOLOL MALEATE 0.5 % OP SOLN
1.0000 [drp] | Freq: Two times a day (BID) | OPHTHALMIC | Status: DC
  Administered 2021-02-26 – 2021-02-27 (×3): 1 [drp] via OPHTHALMIC
  Filled 2021-02-25: qty 5

## 2021-02-25 MED ORDER — BRIMONIDINE TARTRATE 0.2 % OP SOLN
1.0000 [drp] | Freq: Two times a day (BID) | OPHTHALMIC | Status: DC
  Administered 2021-02-26 – 2021-02-27 (×3): 1 [drp] via OPHTHALMIC
  Filled 2021-02-25: qty 5

## 2021-02-25 MED ORDER — TRAZODONE HCL 50 MG PO TABS: 50.0000 mg | ORAL_TABLET | Freq: Every day | ORAL | Status: DC

## 2021-02-25 MED ORDER — ACETAZOLAMIDE 250 MG PO TABS
250.0000 mg | ORAL_TABLET | Freq: Four times a day (QID) | ORAL | Status: DC
  Administered 2021-02-26 – 2021-02-27 (×5): 250 mg via ORAL
  Filled 2021-02-25 (×11): qty 1

## 2021-02-25 MED ORDER — TRAZODONE HCL 50 MG PO TABS: 100.0000 mg | ORAL_TABLET | Freq: Every day | ORAL | Status: DC

## 2021-02-25 MED ORDER — POTASSIUM CHLORIDE ER 20 MEQ PO TBCR: 1.0000 | EXTENDED_RELEASE_TABLET | Freq: Every day | ORAL | Status: DC

## 2021-02-25 MED ORDER — POTASSIUM CHLORIDE CRYS ER 20 MEQ PO TBCR
20.0000 meq | EXTENDED_RELEASE_TABLET | Freq: Every day | ORAL | Status: DC
  Administered 2021-02-26 – 2021-02-27 (×2): 20 meq via ORAL
  Filled 2021-02-25 (×2): qty 1

## 2021-02-25 MED ORDER — BRIMONIDINE TARTRATE-TIMOLOL 0.2-0.5 % OP SOLN: 1.0000 [drp] | Freq: Two times a day (BID) | OPHTHALMIC | Status: DC

## 2021-02-25 NOTE — ED Notes (Signed)
Condom catheter placed on the pt. No complaints noted.

## 2021-02-25 NOTE — Assessment & Plan Note (Addendum)
Blood pressures have been well controlled - Continue home medication of coreg. (PCP holding norvasc and lasix due to hypotension while on diamox)

## 2021-02-25 NOTE — ED Notes (Signed)
Patient transported to MRI 

## 2021-02-25 NOTE — ED Notes (Signed)
Lactic 2.6. MD Schlossman made aware.

## 2021-02-25 NOTE — ED Notes (Signed)
Attempted to in and out catheterize patient, met resistance, removed catheter. RN notified.

## 2021-02-25 NOTE — Progress Notes (Signed)
Patient declined to take his HS medications. Attempted to in/out for urine sample, resistance met and pt refused to be in/out again, placed condom catheter to collect urine at this time.

## 2021-02-25 NOTE — Assessment & Plan Note (Addendum)
Mild, started CTX as above. Will monitor in AM.

## 2021-02-25 NOTE — Assessment & Plan Note (Addendum)
Per wife, no CPAP was recommended after outpatient sleep studies. Check VBG.

## 2021-02-25 NOTE — Progress Notes (Signed)
Pt arrived on the floor alert and oriented x4,  very hard of hearing.  Incontinent of urine,  depend is wet.

## 2021-02-25 NOTE — Assessment & Plan Note (Addendum)
No current pain.  - Continue gtt's and acetazolamide 250mg  QID.

## 2021-02-25 NOTE — Assessment & Plan Note (Signed)
In NSR, continue telemetry Continue eliquis and coreg

## 2021-02-25 NOTE — Assessment & Plan Note (Addendum)
HbA1c 7.0% last month.  - Hold trulicity - SSI and accuchecks per protocol

## 2021-02-25 NOTE — Assessment & Plan Note (Signed)
Lipid panel in 10/2020 LDL of 68, TC: 155, HDL 38, TG: 305

## 2021-02-25 NOTE — Assessment & Plan Note (Addendum)
Baseline of 1.2-1.4, at 1.48 on admission and has improved to baseline with IVF.  - Trend BMP, avoid nephrotoxins, unclear whether he will take adequate po, so will continue IVF for now.

## 2021-02-25 NOTE — Plan of Care (Signed)
  Problem: Education: Goal: Knowledge of General Education information will improve Description: Including pain rating scale, medication(s)/side effects and non-pharmacologic comfort measures Outcome: Not Progressing   

## 2021-02-25 NOTE — ED Provider Notes (Signed)
[facility] EMERGENCY DEPARTMENT Provider Note   CSN: [id number] Arrival date & time: 02/25/21  1145     History  No chief complaint on file.   [name] is a 81 y.o. male.  HPI     81 year old male with history of diabetes, hypertension, hypercalcemia, constipation, upper GI bleed, paroxysmal atrial fibrillation on Eliquis, who presents with concern for altered mental status.   On my initial history, patient's family is not at bedside.  He reports he is not sure why he was brought to the emergency department.  He is oriented to self and location, knowing it is February but states it is Tuesday.  He denies any headache, chest pain, abdominal pain, nausea, vomiting, urinary symptoms, cough, shortness of breath, numbness or weakness on one side or the other.  He is hard of hearing.  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  Per wife:  Eye dr started him on diamox blood pressures low, stopped amlodipine For the last week sleeping a lot, incontinent of urine, incontinent of bowels, fatigue, no back pain, not complaining of pany painon oxy twice day, no changes in dosing of that  No dyuria, vomiting, diarrhea Normally alert, will get up by self and get in wheelchair, normally will play games on computer but hasnot done that Right leg weaker for months, or feels heavier   Past Medical History:  Diagnosis Date   Adenomatous colon polyp    Allergic rhinitis    BMI 40.0-44.9, adult (HCC)    Chronic insomnia    Chronic low back pain    Constipation, unspecified    Diabetes mellitus ([place]) 2015   DJD (degenerative joint disease) of knee    Hypercalcemia    Hypertension    Hypertriglyceridemia    Junctional tachycardia (HCC)    Lumbar facet arthropathy    Morbid obesity (HCC)    Neuropathy    Nonallergic rhinitis    OSA (obstructive sleep apnea)    Other chronic pain    Peripheral axonal neuropathy    Upper GI bleed     due to aspirin   Venous insufficiency    Venous stasis dermatitis of both lower extremities     Past Surgical History:  Procedure Laterality Date   cataract     Bilateral   GLAUCOMA SURGERY     HAND SURGERY Left    s/p amputation of left finger   I & D EXTREMITY Right 04/10/2020   Procedure: IRRIGATION AND DEBRIDEMENT AND AMPUTATION OF RIGHT RING FINGER;  Surgeon: Iran Planas, MD;  Location: [facility];  Service: Orthopedics;  Laterality: Right;    Home Medications Prior to Admission medications   Medication Sig Start Date End Date Taking? Authorizing Provider  acetaZOLAMIDE (DIAMOX) 250 MG tablet Take 250 mg by mouth 4 (four) times daily. 02/10/21  Yes [provider]  amLODipine (NORVASC) 5 MG tablet Take 5 mg by mouth daily. 02/12/21  Yes [provider]  apixaban (ELIQUIS) 5 MG TABS tablet Take 5 mg by mouth 2 (two) times daily.   Yes [provider]  carvedilol (COREG) 6.25 MG tablet Take 6.25 mg by mouth 2 (two) times daily with a meal. 02/09/21  Yes [provider]  COMBIGAN 0.2-0.5 % ophthalmic solution Place 1 drop into the right eye 2 (two) times daily. 07/25/19  Yes [provider]  Dulaglutide (TRULICITY) 1.5 UQ/3.3HL SOPN Inject 1.5 mg into the skin every  Saturday.   Yes [provider]  DULoxetine (CYMBALTA) 60 MG capsule Take 60 mg by mouth daily.   Yes [provider]  furosemide (LASIX) 40 MG tablet Take 40 mg by mouth daily. 02/09/21  Yes [provider]  gabapentin (NEURONTIN) 600 MG tablet Take 600 mg by mouth 2 (two) times daily.   Yes [provider]  metFORMIN (GLUCOPHAGE) 500 MG tablet Take 1,000 mg by mouth 2 (two) times daily with a meal. 02/09/21  Yes [provider]  Multiple Vitamin (MULTIVITAMIN) tablet Take 1 tablet by mouth daily.   Yes [provider]  Netarsudil-Latanoprost (ROCKLATAN) 0.02-0.005 % SOLN Place 1 drop into both eyes at bedtime. 11/14/19  Yes [provider]  oxyCODONE-acetaminophen (PERCOCET/ROXICET) 5-325 MG tablet Take 1 tablet by mouth in the morning and at bedtime. 02/09/21  Yes [provider]  Potassium Chloride ER 20 MEQ TBCR Take 1 tablet by mouth daily. 02/09/21  Yes [provider]  traZODone (DESYREL) 100 MG tablet Take 100 mg by mouth at bedtime. Take along with 50 mg tablet=150 mg 02/09/21  Yes [provider]  traZODone (DESYREL) 150 MG tablet Take 50 mg by mouth at bedtime. Take along with 100 mg tablet=150 mg 01/19/20  Yes [provider]  apixaban (ELIQUIS) 5 MG TABS tablet Take 1 tablet (5 mg total) by mouth 2 (two) times daily. 01/28/20 04/27/20  Minus Breeding, MD  metoprolol tartrate (LOPRESSOR) 25 MG tablet Take 0.5 tablets (12.5 mg total) by mouth 2 (two) times daily. Patient not taking: Reported on 02/25/2021 04/14/20   Shelly Coss, MD  ofloxacin (OCUFLOX) 0.3 % ophthalmic solution Place 1 drop into the right eye 4 (four) times daily. 02/09/21   [provider]  polyethylene glycol (MIRALAX / GLYCOLAX) 17 g packet Take 17 g by mouth daily as needed for moderate constipation. Patient not taking: Reported on 02/25/2021 04/14/20   Shelly Coss, MD  prednisoLONE acetate (PRED FORTE) 1 % ophthalmic suspension Place 1 drop into the right eye 4 (four) times daily. 02/09/21   [provider]      Allergies    Ambien [zolpidem tartrate], Amlodipine besylate, Aspirin, and Dorzolamide hcl-timolol mal    Review of Systems   Review of Systems See above  Physical Exam Updated Vital Signs BP 140/73 (BP Location: Left Arm)    Pulse 65    Temp 98.7 F (37.1 C) (Oral)    Resp 18    SpO2 100%  Physical Exam Vitals and nursing note reviewed.  Constitutional:      General: He is not in acute distress.    Appearance: He is well-developed. He is not diaphoretic.  HENT:     Head: Normocephalic and atraumatic.  Eyes:     Conjunctiva/sclera: Conjunctivae normal.  Cardiovascular:      Rate and Rhythm: Normal rate and regular rhythm.     Heart sounds: Normal heart sounds. No murmur heard.   No friction rub. No gallop.  Pulmonary:     Effort: Pulmonary effort is normal. No respiratory distress.     Breath sounds: Normal breath sounds. No wheezing or rales.  Abdominal:     General: There is no distension.     Palpations: Abdomen is soft.     Tenderness: There is no abdominal tenderness. There is no guarding.  Musculoskeletal:     Cervical back: Normal range of motion.  Skin:    General: Skin is warm and dry.  Neurological:  Mental Status: He is alert and oriented to person, place, and time.     Cranial Nerves: No cranial nerve deficit.     Sensory: No sensory deficit.     Motor: No weakness.     Coordination: Coordination abnormal (left arm past pointing).     Comments: Oriented to self, location, month     ED Results / Procedures / Treatments   Labs (all labs ordered are listed, but only abnormal results are displayed) Labs Reviewed  CBC WITH DIFFERENTIAL/PLATELET - Abnormal; Notable for the following components:      Result Value   WBC 11.8 (*)    Eosinophils Absolute 1.3 (*)    Abs Immature Granulocytes 0.08 (*)    All other components within normal limits  COMPREHENSIVE METABOLIC PANEL - Abnormal; Notable for the following components:   Chloride 113 (*)    CO2 17 (*)    Glucose, Bld 144 (*)    Creatinine, Ser 1.48 (*)    Total Protein 6.4 (*)    Albumin 3.0 (*)    GFR, Estimated 47 (*)    All other components within normal limits  AMMONIA - Abnormal; Notable for the following components:   Ammonia 43 (*)    All other components within normal limits  LACTIC ACID, PLASMA - Abnormal; Notable for the following components:   Lactic Acid, Venous 2.6 (*)    All other components within normal limits  GLUCOSE, CAPILLARY - Abnormal; Notable for the following components:   Glucose-Capillary 129 (*)    All other components within normal limits  CBG  MONITORING, ED - Abnormal; Notable for the following components:   Glucose-Capillary 123 (*)    All other components within normal limits  RESP PANEL BY RT-PCR (FLU A&B, COVID) ARPGX2  URINE CULTURE  CULTURE, BLOOD (ROUTINE X 2)  CULTURE, BLOOD (ROUTINE X 2)  LACTIC ACID, PLASMA  VITAMIN B12  TSH  URINALYSIS, ROUTINE W REFLEX MICROSCOPIC  RAPID URINE DRUG SCREEN, HOSP PERFORMED  BASIC METABOLIC PANEL  CBC  AMMONIA    EKG EKG Interpretation  Date/Time:  Friday February 25 2021 12:15:04 EST Ventricular Rate:  57 PR Interval:  173 QRS Duration: 103 QT Interval:  442 QTC Calculation: 431 R Axis:   -14 Text Interpretation: Sinus rhythm Probable left ventricular hypertrophy No significant change since last tracing Confirmed by Gareth Morgan [phone]) on 02/25/2021 12:23:17 PM  Radiology CT Head Wo Contrast  Result Date: 02/25/2021 CLINICAL DATA:  Altered mental status, nontraumatic (Ped 0-17y) EXAM: CT HEAD WITHOUT CONTRAST TECHNIQUE: Contiguous axial images were obtained from the base of the skull through the vertex without intravenous contrast. RADIATION DOSE REDUCTION: This exam was performed according to the departmental dose-optimization program which includes automated exposure control, adjustment of the mA and/or kV according to patient size and/or use of iterative reconstruction technique. COMPARISON:  CT and MRI 01/06/2020. FINDINGS: Brain: discrete area of encephalomalacia in the periventricular right temporal lobe. No evidence of acute large vascular territory infarction, hemorrhage, hydrocephalus, extra-axial collection or mass lesion/mass effect. 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 Similar cerebral atrophy with ex vacuo ventricular dilation. Vascular: No hyperdense vessel identified. Calcific intracranial atherosclerosis. Skull: No acute fracture. Sinuses/Orbits: Clear sinuses.  Unremarkable orbits. Other: No mastoid effusions. IMPRESSION: 1. No definite  acute abnormality. 2. New discrete area of encephalomalacia in the periventricular right temporal lobe, compatible with interval but remote appearing insult. MRI could further evaluate if clinically indicated. 3. Similar small cortical infarct in the  left frontal lobe. Electronically Signed   By: Margaretha Sheffield M.D.   On: 02/25/2021 13:00   MR BRAIN WO CONTRAST  Result Date: 02/25/2021 CLINICAL DATA:  Altered mental status, concern for stroke EXAM: MRI HEAD WITHOUT CONTRAST TECHNIQUE: Multiplanar, multiecho pulse sequences of the brain and surrounding structures were obtained without intravenous contrast. COMPARISON:  Same-day noncontrast CT head FINDINGS: Brain: There is no evidence of acute intracranial hemorrhage, extra-axial fluid collection, or acute infarct There is mild global parenchymal volume loss with prominence of the ventricular system and extra-axial CSF spaces. There is a small remote cortical infarct in the left middle frontal gyrus (7-21). There is an additional remote infarct in the right temporal lobe as seen on the prior CT, new since the prior study from 01/06/2020. Otherwise, there is a mild burden of white matter signal abnormality likely reflecting mild chronic white matter microangiopathy. There is no mass lesion.  There is no midline shift. Vascular: Normal flow voids. Skull and upper cervical spine: Normal marrow signal. Sinuses/Orbits: The paranasal sinuses are clear. Bilateral lens implants are in place. The globes and orbits are otherwise unremarkable. Other: There are bilateral mastoid effusions. IMPRESSION: 1. No acute intracranial pathology. 2. 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 3. Bilateral mastoid effusions. Electronically Signed   By: Valetta Mole M.D.   On: 02/25/2021 15:50   DG CHEST PORT 1 VIEW  Result Date: 02/25/2021 CLINICAL DATA:   Altered mental status.  UTI. EXAM: PORTABLE CHEST 1 VIEW COMPARISON:  12/29/2019 FINDINGS: 1420 hours. The cardio pericardial silhouette is enlarged. The lungs are clear without focal pneumonia, edema, pneumothorax or pleural effusion. The visualized bony structures of the thorax show no acute abnormality. Telemetry leads overlie the chest. IMPRESSION: No active disease. Electronically Signed   By: Misty Stanley M.D.   On: 02/25/2021 14:35    Procedures Procedures    Medications Ordered in ED Medications  insulin aspart (novoLOG) injection 0-9 Units (0 Units Subcutaneous Not Given 02/25/21 1737)  0.9 %  sodium chloride infusion (has no administration in time range)  acetaminophen (TYLENOL) tablet 650 mg (has no administration in time range)    Or  acetaminophen (TYLENOL) suppository 650 mg (has no administration in time range)  oxyCODONE-acetaminophen (PERCOCET/ROXICET) 5-325 MG per tablet 1 tablet (1 tablet Oral Patient Refused/Not Given 02/25/21 2109)  acetaZOLAMIDE (DIAMOX) tablet 250 mg (250 mg Oral Not Given 02/25/21 2122)  carvedilol (COREG) tablet 6.25 mg (has no administration in time range)  DULoxetine (CYMBALTA) DR capsule 60 mg (has no administration in time range)  apixaban (ELIQUIS) tablet 5 mg (5 mg Oral Patient Refused/Not Given 02/25/21 2109)  multivitamin with minerals tablet 1 tablet (has no administration in time range)  gabapentin (NEURONTIN) capsule 600 mg (600 mg Oral Patient Refused/Not Given 02/25/21 2109)  potassium chloride SA (KLOR-CON M) CR tablet 20 mEq (has no administration in time range)  brimonidine (ALPHAGAN) 0.2 % ophthalmic solution 1 drop (1 drop Right Eye Patient Refused/Not Given 02/25/21 2110)    And  timolol (TIMOPTIC) 0.5 % ophthalmic solution 1 drop (1 drop Right Eye Patient Refused/Not Given 02/25/21 2110)  latanoprost (XALATAN) 0.005 % ophthalmic solution 1 drop (has no administration in time range)    ED Course/ Medical Decision Making/ A&P  Medical Decision Making Amount and/or Complexity of Data Reviewed Labs: ordered. Radiology: ordered.  Risk Decision regarding hospitalization.   81 year old male with history of diabetes, hypertension, hypercalcemia, constipation, upper GI bleed, paroxysmal atrial fibrillation on Eliquis, who presents with concern for altered mental status.  DDx includes CVA, electrolyte abnormality, sepsis, toxic, metabolic encephalopathy.    Exam with past pointing on the left, concern for possible CVA.  Labs personally evaluated by me and show mild leukocytosis, mild nonaniongap metabolic acidosis, mild Cr elevation, mild lactic acid elevation. Ammonia WNL. COVID/flu negative.  UA pending, consider this as etiology. Takes oxycodone and consider medication effect but has not had recent changes in medications per wife.  CT head shows remote cortical infarcts-given concern for possible cerebellar disfunction on exam, concern for possible acute CVA. Plan to obtain MRI. Discussed with Neurology who will consult as needed pending MRI results. Given change from baseline, feel admission for encephalopathy vs CVA indicated.         Final Clinical Impression(s) / ED Diagnoses Final diagnoses:  Altered mental status, unspecified altered mental status type    Rx / DC Orders ED Discharge Orders     None         Gareth Morgan, MD 02/25/21 2231

## 2021-02-25 NOTE — H&P (Signed)
History and Physical    Patient: [name] CLE:[id number] DOB: 1940-10-18 DOA: 02/25/2021 DOS: the patient was seen and examined on 02/25/2021 PCP: Lavone Orn, MD  Patient coming from: Home - lives with his wife and son. WC bound.    Chief Complaint: AMS  HPI: [name] is a 81 y.o. male with medical history significant of PAF on eliquis,  T2DM, HTN, HLD, CKD stage 3, OSA presenting with week long history of weakness, lethargy and sleepy over the past week, urinating on his self and not acting himself. History is from his wife. She states symptoms started Monday night where he was not making sense when he was talking. He would ask questions that didn't make sense. He is at times unable to answer questions. He also saw someone in the hall that wasn't there. He is also weak and unable to transfer like he used to be able to. He also told his wife he has been dizzy for the past week, but wouldn't give more information. He also has been forgetful and not participating in his usual activities.  No slurred speech, facial droop or unilateral weakness. He has had decreased PO intake over past week, most notably past 2 days.   Per his wife  He has had no fever/chills and has no complained of headaches, chest pain/palpitations, shortness of breath, stomach pain, N/V/D, dysuria or leg swelling. He denies any symptoms when asked.   New medication added 2 weeks ago diamox due to increased eye pressures. Stopped furosemide and norvasc due to some hypotension.    ER Course:  vitals: afebrile, bp: 134/75, HR: 58, RR: 18, oxygen: 99%RA Pertinent labs: ammonia: 43, wbc: 11.8, co2: 17, creatinine: 1.48 (1.2-1.3), lactic acid: 2.6 CTH: no definite acute finding. New discrete area of encephalomalacia in periventricular right temporal lobe, compatible with interverbal but remote appearing insult. Similar small cortical infarct in left frontal lobe MRI pending In ED: neurology consulted, urine pending.     Review of Systems: unable to review all systems due to the inability of the patient to answer questions. Past Medical History:  Diagnosis Date   Adenomatous colon polyp    Allergic rhinitis    BMI 40.0-44.9, adult (HCC)    Chronic insomnia    Chronic low back pain    Constipation, unspecified    Diabetes mellitus ([place]) 2015   DJD (degenerative joint disease) of knee    Hypercalcemia    Hypertension    Hypertriglyceridemia    Junctional tachycardia (HCC)    Lumbar facet arthropathy    Morbid obesity (HCC)    Neuropathy    Nonallergic rhinitis    OSA (obstructive sleep apnea)    Other chronic pain    Peripheral axonal neuropathy    Upper GI bleed    due to aspirin   Venous insufficiency    Venous stasis dermatitis of both lower extremities    Past Surgical History:  Procedure Laterality Date   cataract     Bilateral   GLAUCOMA SURGERY     HAND SURGERY Left    s/p amputation of left finger   I & D EXTREMITY Right 04/10/2020   Procedure: IRRIGATION AND DEBRIDEMENT AND AMPUTATION OF RIGHT RING FINGER;  Surgeon: Iran Planas, MD;  Location: [facility];  Service: Orthopedics;  Laterality: Right;   Social History:  reports that he has quit smoking. He has never used smokeless tobacco. He reports that he does not drink alcohol and does not use drugs.  Allergies  Allergen Reactions   Ambien [Zolpidem Tartrate]     "lost his mind"   Amlodipine Besylate Other (See Comments)    Other reaction(s): edema edema    Aspirin     Bleeding ulcer   Dorzolamide Hcl-Timolol Mal     Other reaction(s): Other (See Comments), red and burning    Family History  Problem Relation Age of Onset   Gout Father    Osteoarthritis Mother        Deceased, 86   Healthy Brother        x2   Healthy Son        x2    Prior to Admission medications   Medication Sig Start Date End Date Taking? Authorizing Provider  acetaminophen (TYLENOL) 500 MG tablet Take 1,000 mg by mouth every 6 (six) hours  as needed for moderate pain.    [provider]  apixaban (ELIQUIS) 5 MG TABS tablet Take 1 tablet (5 mg total) by mouth 2 (two) times daily. 01/28/20 04/27/20  Minus Breeding, MD  COMBIGAN 0.2-0.5 % ophthalmic solution Place 1 drop into the right eye 2 (two) times daily. 07/25/19   [provider]  Dulaglutide (TRULICITY) 1.5 GB/1.5VV SOPN Inject 1.5 mg into the skin every Saturday.    [provider]  DULoxetine (CYMBALTA) 60 MG capsule Take 60 mg by mouth daily.    [provider]  gabapentin (NEURONTIN) 600 MG tablet Take 600 mg by mouth 2 (two) times daily.    [provider]  HYDROcodone-acetaminophen (NORCO/VICODIN) 5-325 MG tablet Take 1 tablet by mouth every 6 (six) hours as needed. 03/31/20   [provider]  ipratropium (ATROVENT) 0.03 % nasal spray Place 2 sprays into both nostrils 2 (two) times daily as needed.    [provider]  metFORMIN (GLUCOPHAGE-XR) 500 MG 24 hr tablet Take 500 mg by mouth 2 (two) times daily. 01/21/20   [provider]  metoprolol tartrate (LOPRESSOR) 25 MG tablet Take 0.5 tablets (12.5 mg total) by mouth 2 (two) times daily. 04/14/20   Shelly Coss, MD  Multiple Vitamin (MULTIVITAMIN) tablet Take 1 tablet by mouth daily.    [provider]  Netarsudil-Latanoprost (ROCKLATAN) 0.02-0.005 % SOLN Place 1 drop into the right eye at bedtime. 11/14/19   [provider]  polyethylene glycol (MIRALAX / GLYCOLAX) 17 g packet Take 17 g by mouth daily as needed for moderate constipation. 04/14/20   Shelly Coss, MD  traZODone (DESYREL) 150 MG tablet Take 150 mg by mouth at bedtime. 01/19/20   [provider]    Physical Exam: Vitals:   02/25/21 1815 02/25/21 1830 02/25/21 1838 02/25/21 1905  BP: 126/83 132/83  140/73  Pulse: 65 67  65  Resp: (!) 26 20  18   Temp:   98.5 F (36.9 C) 98.7 F (37.1 C)  TempSrc:   Oral Oral  SpO2: 99% 99%  100%   General:  Appears calm and  comfortable and is in NAD Eyes:  PERRL, EOMI, normal lids, iris ENT:  grossly normal hearing, lips & tongue, mmm; no teeth  Neck:  no LAD, masses or thyromegaly; no carotid bruits Cardiovascular:  RRR, no m/r/g. 1+ bilateral LE edema with scabbed over rash on anterior tibias.  Respiratory:   CTA bilaterally with no wheezes/rales/rhonchi.  Normal respiratory effort. Abdomen:  soft, NT, ND, NABS Back:   normal alignment, no CVAT Skin:  bilateral rash on anterior tibias  Musculoskeletal:  grossly normal tone BUE/BLE, good ROM,  no bony abnormality Lower extremity:  No LE edema.  Limited foot exam with no ulcerations.  2+ distal pulses. Psychiatric:  grossly normal mood and affect, speech fluent and appropriate, AOx3 Neurologic:  CN 2-12 grossly intact, moves all extremities in coordinated fashion, sensation intact   Radiological Exams on Admission: Independently reviewed - see discussion in A/P where applicable  CT Head Wo Contrast  Result Date: 02/25/2021 CLINICAL DATA:  Altered mental status, nontraumatic (Ped 0-17y) EXAM: CT HEAD WITHOUT CONTRAST TECHNIQUE: Contiguous axial images were obtained from the base of the skull through the vertex without intravenous contrast. RADIATION DOSE REDUCTION: This exam was performed according to the departmental dose-optimization program which includes automated exposure control, adjustment of the mA and/or kV according to patient size and/or use of iterative reconstruction technique. COMPARISON:  CT and MRI 01/06/2020. FINDINGS: Brain: discrete area of encephalomalacia in the periventricular right temporal lobe. No evidence of acute large vascular territory infarction, hemorrhage, hydrocephalus, extra-axial collection or mass lesion/mass effect. 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 Similar cerebral atrophy with ex vacuo ventricular dilation. Vascular: No hyperdense vessel identified. Calcific intracranial atherosclerosis. Skull: No acute  fracture. Sinuses/Orbits: Clear sinuses.  Unremarkable orbits. Other: No mastoid effusions. IMPRESSION: 1. No definite acute abnormality. 2. New discrete area of encephalomalacia in the periventricular right temporal lobe, compatible with interval but remote appearing insult. MRI could further evaluate if clinically indicated. 3. Similar small cortical infarct in the left frontal lobe. Electronically Signed   By: Margaretha Sheffield M.D.   On: 02/25/2021 13:00   MR BRAIN WO CONTRAST  Result Date: 02/25/2021 CLINICAL DATA:  Altered mental status, concern for stroke EXAM: MRI HEAD WITHOUT CONTRAST TECHNIQUE: Multiplanar, multiecho pulse sequences of the brain and surrounding structures were obtained without intravenous contrast. COMPARISON:  Same-day noncontrast CT head FINDINGS: Brain: There is no evidence of acute intracranial hemorrhage, extra-axial fluid collection, or acute infarct There is mild global parenchymal volume loss with prominence of the ventricular system and extra-axial CSF spaces. There is a small remote cortical infarct in the left middle frontal gyrus (7-21). There is an additional remote infarct in the right temporal lobe as seen on the prior CT, new since the prior study from 01/06/2020. Otherwise, there is a mild burden of white matter signal abnormality likely reflecting mild chronic white matter microangiopathy. There is no mass lesion.  There is no midline shift. Vascular: Normal flow voids. Skull and upper cervical spine: Normal marrow signal. Sinuses/Orbits: The paranasal sinuses are clear. Bilateral lens implants are in place. The globes and orbits are otherwise unremarkable. Other: There are bilateral mastoid effusions. IMPRESSION: 1. No acute intracranial pathology. 2. 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 3. Bilateral mastoid effusions. Electronically  Signed   By: Valetta Mole M.D.   On: 02/25/2021 15:50   DG CHEST PORT 1 VIEW  Result Date: 02/25/2021 CLINICAL DATA:  Altered mental status.  UTI. EXAM: PORTABLE CHEST 1 VIEW COMPARISON:  12/29/2019 FINDINGS: 1420 hours. The cardio pericardial silhouette is enlarged. The lungs are clear without focal pneumonia, edema, pneumothorax or pleural effusion. The visualized bony structures of the thorax show no acute abnormality. Telemetry leads overlie the chest. IMPRESSION: No active disease. Electronically Signed   By: Misty Stanley M.D.   On: 02/25/2021 14:35    EKG: Independently reviewed.  Sinus bradycardia with rate 57; nonspecific ST changes with no  evidence of acute ischemia   Labs on Admission: I have personally reviewed the available labs and imaging studies at the time of the admission.  Pertinent labs:   ammonia: 43,  wbc: 11.8,  co2: 17,  creatinine: 1.48 (1.2-1.4),  lactic acid: 2.6   Assessment and Plan: * Encephalopathy acute- (present on admission) 81 year old with 5 day history of increased weakness, lethargy and confusion admitted for acute encephalopathy -observation to telemetry -protecting airway and easily arouses and alert to person, place and date. Falls back to sleep quickly -MRI brain with no acute infarct or acute finding. does have mild remote infarcts.  -labs with lactic acid of 2.6. likely more due to hypovolemia, but urine has not been collected. Cultures pending, have asked nurse again to collect urine and repeat lactic acid. IVF overnight -pan cultured, again, ? If change in mental status could be from UTI, but urine still not collected.  -metabolic labs including TSH, UDS, B12. Ammonia slightly elevated at 43-repeat in AM  -he did recently start diamox 2 weeks ago for increased eye pressures. SE does include confusion, fatigue, drowsiness. If work up unrevealing, could consult optho for possible alternative tx.  -will hold his trazodone, as already very  sleepy.  -on oxycodone 5mg  BID, but has been on this for some time and fills appropriately   Leukocytosis- (present on admission) Mild leukocytosis, could be inflammatory vs. Infectious IVF overnight UA pending, cultures pending Trend and follow fever curve   Chronic kidney disease, stage 3a ([place])- (present on admission) Baseline of 1.2-1.4, at 1.48 today UA still not collected, have asked for a bladder scan or I/O cath Intake and output Trend bmp  IVF overnight   PAF (paroxysmal atrial fibrillation) ([place])- (present on admission) In NSR, continue telemetry Continue eliquis and coreg   Essential hypertension- (present on admission) Blood pressures have been well controlled Continue home medication of coreg. (PCP holding norvasc and lasix due to hypotension while on diamox)  Diabetes mellitus type 2 in obese [facility])- (present on admission) a1c 7.0 in 01/5174 Hold trulicity SSI and accuchecks per protocol   HLD (hyperlipidemia)- (present on admission) Lipid panel in 10/2020 LDL of 68, TC: 155, HDL 38, TG: 305  Obstructive sleep apnea- (present on admission) Not on cpap, per wife has had sleep studies done and told he didn't need one   Glaucoma- (present on admission) Continue drops, continue diamox for now     Advance Care Planning:   Code Status: Full Code    Consults: none   DVT Prophylaxis: eliquis  Family Communication: wife at bedside: [name]   Severity of Illness: The appropriate patient status for this patient is OBSERVATION. Observation status is judged to be reasonable and necessary in order to provide the required intensity of service to ensure the patient's safety. The patient's presenting symptoms, physical exam findings, and initial radiographic and laboratory data in the context of their medical condition is felt to place them at decreased risk for further clinical deterioration. Furthermore, it is anticipated that the patient will be medically stable for  discharge from the hospital within 2 midnights of admission.   Author: Orma Flaming, MD 02/25/2021 9:05 PM  For on call review [email or web address].

## 2021-02-25 NOTE — ED Triage Notes (Addendum)
Pt BIB [facility] from home d/t AMS and possible UTI suspected by family. Per EMS, pt has not been acting like himself for the past 1 week. Pt has also been incontinent. A&O X2  Bp 120/70 HR 70 RR 20  SPO2 97%  CBG 135

## 2021-02-25 NOTE — Assessment & Plan Note (Addendum)
Suspect metabolic etiology with negative acute neuroimaging. Though imaging does suggest atrophy, previous strokes/microangiopathy predisposing to AMS with the right precipitant. - Continue antibiotics for UTI, started ceftriaxone 2g pending urine culture and blood cultures with leukocytosis.  - Mild ammonia elevation noted, previous CT abd/pelvis in 2015 showed fatty liver, though LFTs are not elevated, no clinical evidence of cirrhosis. We will monitor mentation without lactulose for now. - Having started acetazolamide prior to onset, with side effects including confusion, fatigue, drowsiness. If not improving with fluids, antibiotics, etc. we may need to consult ophthalmology for alternative treatment. - Hold trazodone - Continue long term oxycodone 5mg  BID to avoid withdrawal.

## 2021-02-26 ENCOUNTER — Other Ambulatory Visit: Payer: Self-pay

## 2021-02-26 ENCOUNTER — Encounter (HOSPITAL_COMMUNITY): Payer: Self-pay | Admitting: Family Medicine

## 2021-02-26 DIAGNOSIS — E1122 Type 2 diabetes mellitus with diabetic chronic kidney disease: Secondary | ICD-10-CM | POA: Diagnosis present

## 2021-02-26 DIAGNOSIS — H5461 Unqualified visual loss, right eye, normal vision left eye: Secondary | ICD-10-CM | POA: Diagnosis present

## 2021-02-26 DIAGNOSIS — E669 Obesity, unspecified: Secondary | ICD-10-CM | POA: Diagnosis present

## 2021-02-26 DIAGNOSIS — G4733 Obstructive sleep apnea (adult) (pediatric): Secondary | ICD-10-CM | POA: Diagnosis present

## 2021-02-26 DIAGNOSIS — I10 Essential (primary) hypertension: Secondary | ICD-10-CM

## 2021-02-26 DIAGNOSIS — E1169 Type 2 diabetes mellitus with other specified complication: Secondary | ICD-10-CM

## 2021-02-26 DIAGNOSIS — M1710 Unilateral primary osteoarthritis, unspecified knee: Secondary | ICD-10-CM | POA: Diagnosis present

## 2021-02-26 DIAGNOSIS — E781 Pure hyperglyceridemia: Secondary | ICD-10-CM | POA: Diagnosis present

## 2021-02-26 DIAGNOSIS — G9341 Metabolic encephalopathy: Secondary | ICD-10-CM | POA: Diagnosis present

## 2021-02-26 DIAGNOSIS — H409 Unspecified glaucoma: Secondary | ICD-10-CM

## 2021-02-26 DIAGNOSIS — E1142 Type 2 diabetes mellitus with diabetic polyneuropathy: Secondary | ICD-10-CM | POA: Diagnosis present

## 2021-02-26 DIAGNOSIS — N1831 Chronic kidney disease, stage 3a: Secondary | ICD-10-CM | POA: Diagnosis present

## 2021-02-26 DIAGNOSIS — G9389 Other specified disorders of brain: Secondary | ICD-10-CM | POA: Diagnosis present

## 2021-02-26 DIAGNOSIS — Z20822 Contact with and (suspected) exposure to covid-19: Secondary | ICD-10-CM | POA: Diagnosis present

## 2021-02-26 DIAGNOSIS — Z8673 Personal history of transient ischemic attack (TIA), and cerebral infarction without residual deficits: Secondary | ICD-10-CM | POA: Diagnosis not present

## 2021-02-26 DIAGNOSIS — Z6836 Body mass index (BMI) 36.0-36.9, adult: Secondary | ICD-10-CM | POA: Diagnosis not present

## 2021-02-26 DIAGNOSIS — E861 Hypovolemia: Secondary | ICD-10-CM | POA: Diagnosis present

## 2021-02-26 DIAGNOSIS — K760 Fatty (change of) liver, not elsewhere classified: Secondary | ICD-10-CM | POA: Diagnosis present

## 2021-02-26 DIAGNOSIS — G8929 Other chronic pain: Secondary | ICD-10-CM | POA: Diagnosis present

## 2021-02-26 DIAGNOSIS — R159 Full incontinence of feces: Secondary | ICD-10-CM | POA: Diagnosis present

## 2021-02-26 DIAGNOSIS — E785 Hyperlipidemia, unspecified: Secondary | ICD-10-CM | POA: Diagnosis present

## 2021-02-26 DIAGNOSIS — Z79899 Other long term (current) drug therapy: Secondary | ICD-10-CM | POA: Diagnosis not present

## 2021-02-26 DIAGNOSIS — I480 Paroxysmal atrial fibrillation: Secondary | ICD-10-CM | POA: Diagnosis present

## 2021-02-26 DIAGNOSIS — R32 Unspecified urinary incontinence: Secondary | ICD-10-CM | POA: Diagnosis present

## 2021-02-26 DIAGNOSIS — G9340 Encephalopathy, unspecified: Secondary | ICD-10-CM | POA: Diagnosis present

## 2021-02-26 DIAGNOSIS — N390 Urinary tract infection, site not specified: Secondary | ICD-10-CM | POA: Diagnosis present

## 2021-02-26 DIAGNOSIS — I129 Hypertensive chronic kidney disease with stage 1 through stage 4 chronic kidney disease, or unspecified chronic kidney disease: Secondary | ICD-10-CM | POA: Diagnosis present

## 2021-02-26 LAB — URINALYSIS, ROUTINE W REFLEX MICROSCOPIC
Bacteria, UA: NONE SEEN
Bilirubin Urine: NEGATIVE
Glucose, UA: NEGATIVE mg/dL
Ketones, ur: NEGATIVE mg/dL
Leukocytes,Ua: NEGATIVE
Nitrite: NEGATIVE
Protein, ur: NEGATIVE mg/dL
RBC / HPF: >50 RBC/hpf — ABNORMAL HIGH (ref 0–5)
Specific Gravity, Urine: 1.015 (ref 1.005–1.030)
pH: 5 (ref 5.0–8.0)

## 2021-02-26 LAB — CBC
HCT: 43.1 % (ref 39.0–52.0)
Hemoglobin: 14.3 g/dL (ref 13.0–17.0)
MCH: 30 pg (ref 26.0–34.0)
MCHC: 33.2 g/dL (ref 30.0–36.0)
MCV: 90.4 fL (ref 80.0–100.0)
Platelets: 222 10*3/uL (ref 150–400)
RBC: 4.77 MIL/uL (ref 4.22–5.81)
RDW: 14.1 % (ref 11.5–15.5)
WBC: 12.1 10*3/uL — ABNORMAL HIGH (ref 4.0–10.5)
nRBC: 0 % (ref 0.0–0.2)

## 2021-02-26 LAB — GLUCOSE, CAPILLARY
Glucose-Capillary: 118 mg/dL — ABNORMAL HIGH (ref 70–99)
Glucose-Capillary: 189 mg/dL — ABNORMAL HIGH (ref 70–99)
Glucose-Capillary: 175 mg/dL — ABNORMAL HIGH (ref 70–99)
Glucose-Capillary: 151 mg/dL — ABNORMAL HIGH (ref 70–99)

## 2021-02-26 LAB — BASIC METABOLIC PANEL
Anion gap: 12 (ref 5–15)
BUN: 13 mg/dL (ref 8–23)
CO2: 17 mmol/L — ABNORMAL LOW (ref 22–32)
Calcium: 9.5 mg/dL (ref 8.9–10.3)
Chloride: 110 mmol/L (ref 98–111)
Creatinine, Ser: 1.31 mg/dL — ABNORMAL HIGH (ref 0.61–1.24)
GFR, Estimated: 55 mL/min — ABNORMAL LOW (ref >60)
Glucose, Bld: 124 mg/dL — ABNORMAL HIGH (ref 70–99)
Potassium: 4.7 mmol/L (ref 3.5–5.1)
Sodium: 139 mmol/L (ref 135–145)

## 2021-02-26 LAB — RAPID URINE DRUG SCREEN, HOSP PERFORMED
Amphetamines: NOT DETECTED
Barbiturates: NOT DETECTED
Benzodiazepines: NOT DETECTED
Cocaine: NOT DETECTED
Opiates: NOT DETECTED
Tetrahydrocannabinol: NOT DETECTED

## 2021-02-26 LAB — AMMONIA: Ammonia: 40 umol/L — ABNORMAL HIGH (ref 9–35)

## 2021-02-26 MED ORDER — SODIUM CHLORIDE 0.9 % IV SOLN
2.0000 g | INTRAVENOUS | Status: DC
  Administered 2021-02-26 – 2021-02-27 (×2): 2 g via INTRAVENOUS
  Filled 2021-02-26 (×2): qty 20

## 2021-02-26 NOTE — Care Management Obs Status (Signed)
[facility] NOTIFICATION   Patient Details  Name: CHAI ROUTH MRN: [id number] Date of Birth: [birth year]   Medicare Observation Status Notification Given:  Yes    Pastor Sgro G., RN 02/26/2021, 2:53 PM

## 2021-02-26 NOTE — Progress Notes (Signed)
Urine samples obtained from new application of condom catheter upon arrival. samples sent.

## 2021-02-26 NOTE — Progress Notes (Signed)
Progress Note  Patient: [name] PNT:[id number] DOB: 05-26-1940  DOA: 02/25/2021  DOS: 02/26/2021    Brief hospital course: [name] is a 81 y.o. male with medical history significant of PAF on eliquis,  T2DM, HTN, HLD, CKD stage 3, OSA presenting with week long history of weakness, lethargy and sleepy over the past week, urinating on himself and not acting himself. He was afebrile with stable vital signs, WBC 11.8k, lactic acid 2.6, bicarb 17, creatinine slightly above baseline at 1.48 (1.2 - 1.3), ammonia 43. CTH: no definite acute finding. New discrete area of encephalomalacia in periventricular right temporal lobe, compatible with interval but remote appearing insult. Similar small cortical infarct in left frontal lobe. 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 No acute intracranial pathology was noted.   Urinalysis revealed pyuria for which urine culture was sent and antibiotics started. The patient remains slightly confused.  Assessment and Plan: * Encephalopathy acute- (present on admission) Suspect metabolic etiology with negative acute neuroimaging. Though imaging does suggest atrophy, previous strokes/microangiopathy predisposing to AMS with the right precipitant. - Continue antibiotics for UTI, started ceftriaxone 2g pending urine culture and blood cultures with leukocytosis.  - Mild ammonia elevation noted, previous CT abd/pelvis in 2015 showed fatty liver, though LFTs are not elevated, no clinical evidence of cirrhosis. We will monitor mentation without lactulose for now. - Having started acetazolamide prior to onset, with side effects including confusion, fatigue, drowsiness. If not improving with fluids, antibiotics, etc. we may need to consult ophthalmology for alternative treatment. - Hold trazodone - Continue long term oxycodone 5mg  BID to avoid  withdrawal.  Leukocytosis- (present on admission) Mild, started CTX as above. Will monitor in AM.  Obstructive sleep apnea- (present on admission) Per wife, no CPAP was recommended after outpatient sleep studies. Check VBG.  HLD (hyperlipidemia)- (present on admission) Lipid panel in 10/2020 LDL of 68, TC: 155, HDL 38, TG: 305  Essential hypertension- (present on admission) Blood pressures have been well controlled - Continue home medication of coreg. (PCP holding norvasc and lasix due to hypotension while on diamox)  Chronic kidney disease, stage 3a ([place])- (present on admission) Baseline of 1.2-1.4, at 1.48 on admission and has improved to baseline with IVF.  - Trend BMP, avoid nephrotoxins, unclear whether he will take adequate po, so will continue IVF for now.   PAF (paroxysmal atrial fibrillation) ([place])- (present on admission) In NSR, continue telemetry Continue eliquis and coreg   Glaucoma- (present on admission) No current pain.  - Continue gtt's and acetazolamide 250mg  QID.    Diabetes mellitus type 2 in obese ([name])- (present on admission) HbA1c 7.0% last month.  - Hold trulicity - SSI and accuchecks per protocol     Obesity: Estimated body mass index is 36.18 kg/m as calculated from the following:   Height as of 04/10/20: 5\' 9"  (1.753 m).   Weight as of 04/10/20: 111.1 kg.   Subjective: Patient sleeping soundly, awoken and is interactive. Denies any pain or dyspnea or nausea or other complaints. He knows it's 2023 but not the month. Knows he's in the hospital but wasn't sure why. He is bedbound at baseline.  Objective: Vitals:   02/26/21 0347 02/26/21 0821 02/26/21 1118 02/26/21 1505  BP: 140/65 (!) 130/56 129/67 116/62  Pulse: (!) 58 (!) 56 65 62  Resp: 17 18 18 17   Temp: 98.4 F (36.9 C) 98 F (36.7 C)  98 F (36.7 C) 97.6 F (36.4 C)  TempSrc: Oral   Oral  SpO2: 98% 98% 95% 97%   Gen: Chronically ill-appearing 81 y.o. male in no distress Pulm: Nonlabored  breathing room air.  CV: Regular rate and rhythm. No murmur, rub, or gallop. No JVD, no pitting dependent edema. GI: Abdomen soft, obese, non-tender, non-distended, with normoactive bowel sounds.  Ext: Warm, dry, has digit amputations without any acute deformities Skin: No jaundice, rashes, lesions or ulcers on visualized skin. Neuro: Drowsy, bradyphrenic, not oriented to month/day or situation. No asterixis or focal neurological deficits. Psych: Judgement and insight appear marginal. Mood euthymic & affect congruent. Behavior is appropriate.    Data Personally reviewed:  CBC: Recent Labs  Lab 02/25/21 1252 02/26/21 0509  WBC 11.8* 12.1*  NEUTROABS 7.3  --   HGB 13.9 14.3  HCT 43.5 43.1  MCV 95.0 90.4  PLT 215 595   Basic Metabolic Panel: Recent Labs  Lab 02/25/21 1252 02/26/21 0509  NA 141 139  K 4.1 4.7  CL 113* 110  CO2 17* 17*  GLUCOSE 144* 124*  BUN 12 13  CREATININE 1.48* 1.31*  CALCIUM 9.4 9.5   GFR: CrCl cannot be calculated (Unknown ideal weight.). Liver Function Tests: Recent Labs  Lab 02/25/21 1252  AST 21  ALT 11  ALKPHOS 87  BILITOT 0.5  PROT 6.4*  ALBUMIN 3.0*   No results for input(s): LIPASE, AMYLASE in the last 168 hours. Recent Labs  Lab 02/25/21 1251 02/26/21 0509  AMMONIA 43* 40*   Coagulation Profile: No results for input(s): INR, PROTIME in the last 168 hours. Cardiac Enzymes: No results for input(s): CKTOTAL, CKMB, CKMBINDEX, TROPONINI in the last 168 hours. BNP (last 3 results) No results for input(s): PROBNP in the last 8760 hours. HbA1C: No results for input(s): HGBA1C in the last 72 hours. CBG: Recent Labs  Lab 02/25/21 1703 02/25/21 2148 02/26/21 0614 02/26/21 1122  GLUCAP 123* 129* 118* 189*   Lipid Profile: No results for input(s): CHOL, HDL, LDLCALC, TRIG, CHOLHDL, LDLDIRECT in the last 72 hours. Thyroid Function Tests: Recent Labs    02/25/21 2030  TSH 0.589   Anemia Panel: Recent Labs    02/25/21 2030   VITAMINB12 531   Urine analysis:    Component Value Date/Time   COLORURINE YELLOW 02/25/2021 0016   APPEARANCEUR HAZY (A) 02/25/2021 0016   LABSPEC 1.015 02/25/2021 0016   PHURINE 5.0 02/25/2021 0016   GLUCOSEU NEGATIVE 02/25/2021 0016   HGBUR MODERATE (A) 02/25/2021 0016   BILIRUBINUR NEGATIVE 02/25/2021 0016   KETONESUR NEGATIVE 02/25/2021 0016   PROTEINUR NEGATIVE 02/25/2021 0016   NITRITE NEGATIVE 02/25/2021 0016   LEUKOCYTESUR NEGATIVE 02/25/2021 0016   Recent Results (from the past 240 hour(s))  Resp Panel by RT-PCR (Flu A&B, Covid) Nasopharyngeal Swab     Status: None   Collection Time: 02/25/21 12:13 PM   Specimen: Nasopharyngeal Swab; Nasopharyngeal(NP) swabs in vial transport medium  Result Value Ref Range Status   SARS Coronavirus 2 by RT PCR NEGATIVE NEGATIVE Final    Comment: (NOTE) SARS-CoV-2 target nucleic acids are NOT DETECTED.  The SARS-CoV-2 RNA is generally detectable in upper respiratory specimens during the acute phase of infection. The lowest concentration of SARS-CoV-2 viral copies this assay can detect is 138 copies/mL. A negative result does not preclude SARS-Cov-2 infection and should not be used as the sole basis for treatment or other patient management decisions. A negative result may occur with  improper specimen collection/handling, submission  of specimen other than nasopharyngeal swab, presence of viral mutation(s) within the areas targeted by this assay, and inadequate number of viral copies(<138 copies/mL). A negative result must be combined with clinical observations, patient history, and epidemiological information. The expected result is Negative.  Fact Sheet for Patients:  EntrepreneurPulse.com.au  Fact Sheet for Healthcare Providers:  IncredibleEmployment.be  This test is no t yet approved or cleared by the [place] FDA and  has been authorized for detection and/or diagnosis of SARS-CoV-2  by FDA under an Emergency Use Authorization (EUA). This EUA will remain  in effect (meaning this test can be used) for the duration of the COVID-19 declaration under Section 564(b)(1) of the Act, 21 U.S.C.section 360bbb-3(b)(1), unless the authorization is terminated  or revoked sooner.       Influenza A by PCR NEGATIVE NEGATIVE Final   Influenza B by PCR NEGATIVE NEGATIVE Final    Comment: (NOTE) The Xpert Xpress SARS-CoV-2/FLU/RSV plus assay is intended as an aid in the diagnosis of influenza from Nasopharyngeal swab specimens and should not be used as a sole basis for treatment. Nasal washings and aspirates are unacceptable for Xpert Xpress SARS-CoV-2/FLU/RSV testing.  Fact Sheet for Patients: EntrepreneurPulse.com.au  Fact Sheet for Healthcare Providers: IncredibleEmployment.be  This test is not yet approved or cleared by the [place] FDA and has been authorized for detection and/or diagnosis of SARS-CoV-2 by FDA under an Emergency Use Authorization (EUA). This EUA will remain in effect (meaning this test can be used) for the duration of the COVID-19 declaration under Section 564(b)(1) of the Act, 21 U.S.C. section 360bbb-3(b)(1), unless the authorization is terminated or revoked.  Performed at [facility], [place] [address] [place], [place] [address]   Culture, blood (routine x 2)     Status: None (Preliminary result)   Collection Time: 02/25/21  8:30 PM   Specimen: BLOOD RIGHT HAND  Result Value Ref Range Status   Specimen Description BLOOD RIGHT HAND  Final   Special Requests   Final    AEROBIC BOTTLE ONLY Blood Culture results may not be optimal due to an inadequate volume of blood received in culture bottles   Culture   Final    NO GROWTH < 12 HOURS Performed at [facility], [place] [address] [place], [place] [address]    Report Status PENDING  Incomplete  Culture, blood (routine x 2)     Status: None  (Preliminary result)   Collection Time: 02/25/21  8:34 PM   Specimen: BLOOD  Result Value Ref Range Status   Specimen Description BLOOD LEFT ANTECUBITAL  Final   Special Requests   Final    AEROBIC BOTTLE ONLY Blood Culture results may not be optimal due to an inadequate volume of blood received in culture bottles   Culture   Final    NO GROWTH < 12 HOURS Performed at [facility], [place] [address] [place], [place] [address]    Report Status PENDING  Incomplete     CT Head Wo Contrast  Result Date: 02/25/2021 CLINICAL DATA:  Altered mental status, nontraumatic (Ped 0-17y) EXAM: CT HEAD WITHOUT CONTRAST TECHNIQUE: Contiguous axial images were obtained from the base of the skull through the vertex without intravenous contrast. RADIATION DOSE REDUCTION: This exam was performed according to the departmental dose-optimization program which includes automated exposure control, adjustment of the mA and/or kV according to patient size and/or use of iterative reconstruction technique. COMPARISON:  CT and MRI 01/06/2020. FINDINGS: Brain: discrete  area of encephalomalacia in the periventricular right temporal lobe. No evidence of acute large vascular territory infarction, hemorrhage, hydrocephalus, extra-axial collection or mass lesion/mass effect. 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 Similar cerebral atrophy with ex vacuo ventricular dilation. Vascular: No hyperdense vessel identified. Calcific intracranial atherosclerosis. Skull: No acute fracture. Sinuses/Orbits: Clear sinuses.  Unremarkable orbits. Other: No mastoid effusions. IMPRESSION: 1. No definite acute abnormality. 2. New discrete area of encephalomalacia in the periventricular right temporal lobe, compatible with interval but remote appearing insult. MRI could further evaluate if clinically indicated. 3. Similar small cortical infarct in the left frontal lobe. Electronically Signed   By: Margaretha Sheffield M.D.   On:  02/25/2021 13:00   MR BRAIN WO CONTRAST  Result Date: 02/25/2021 CLINICAL DATA:  Altered mental status, concern for stroke EXAM: MRI HEAD WITHOUT CONTRAST TECHNIQUE: Multiplanar, multiecho pulse sequences of the brain and surrounding structures were obtained without intravenous contrast. COMPARISON:  Same-day noncontrast CT head FINDINGS: Brain: There is no evidence of acute intracranial hemorrhage, extra-axial fluid collection, or acute infarct There is mild global parenchymal volume loss with prominence of the ventricular system and extra-axial CSF spaces. There is a small remote cortical infarct in the left middle frontal gyrus (7-21). There is an additional remote infarct in the right temporal lobe as seen on the prior CT, new since the prior study from 01/06/2020. Otherwise, there is a mild burden of white matter signal abnormality likely reflecting mild chronic white matter microangiopathy. There is no mass lesion.  There is no midline shift. Vascular: Normal flow voids. Skull and upper cervical spine: Normal marrow signal. Sinuses/Orbits: The paranasal sinuses are clear. Bilateral lens implants are in place. The globes and orbits are otherwise unremarkable. Other: There are bilateral mastoid effusions. IMPRESSION: 1. No acute intracranial pathology. 2. 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 3. Bilateral mastoid effusions. Electronically Signed   By: Valetta Mole M.D.   On: 02/25/2021 15:50   DG CHEST PORT 1 VIEW  Result Date: 02/25/2021 CLINICAL DATA:  Altered mental status.  UTI. EXAM: PORTABLE CHEST 1 VIEW COMPARISON:  12/29/2019 FINDINGS: 1420 hours. The cardio pericardial silhouette is enlarged. The lungs are clear without focal pneumonia, edema, pneumothorax or pleural effusion. The visualized bony structures of the thorax show no acute abnormality. Telemetry leads  overlie the chest. IMPRESSION: No active disease. Electronically Signed   By: Misty Stanley M.D.   On: 02/25/2021 14:35    SARS-CoV-2 PCR (2/17): Negative Influenza A/B: Negative  Family Communication: None at bedside. Called wife for collateral information and didn't get an answer.  Disposition: Status is: Inpatient Remains inpatient appropriate because: persistent altered mental status.  Planned Discharge Destination: Home   Patrecia Pour, MD 02/26/2021 3:54 PM Page by [email or web address]

## 2021-02-27 ENCOUNTER — Inpatient Hospital Stay (HOSPITAL_COMMUNITY): Payer: Medicare Other

## 2021-02-27 DIAGNOSIS — N390 Urinary tract infection, site not specified: Principal | ICD-10-CM

## 2021-02-27 LAB — BLOOD GAS, VENOUS
Acid-base deficit: 4.1 mmol/L — ABNORMAL HIGH (ref 0.0–2.0)
Bicarbonate: 23 mmol/L (ref 20.0–28.0)
Drawn by: 3468
O2 Saturation: 72.1 %
Patient temperature: 36.3
pCO2, Ven: 48 mmHg (ref 44–60)
pH, Ven: 7.29 (ref 7.25–7.43)
pO2, Ven: 39 mmHg (ref 32–45)

## 2021-02-27 LAB — URINE CULTURE: Culture: NO GROWTH

## 2021-02-27 LAB — CBC
HCT: 42.4 % (ref 39.0–52.0)
Hemoglobin: 13.5 g/dL (ref 13.0–17.0)
MCH: 29.9 pg (ref 26.0–34.0)
MCHC: 31.8 g/dL (ref 30.0–36.0)
MCV: 93.8 fL (ref 80.0–100.0)
Platelets: 227 10*3/uL (ref 150–400)
RBC: 4.52 MIL/uL (ref 4.22–5.81)
RDW: 14.1 % (ref 11.5–15.5)
WBC: 10.2 10*3/uL (ref 4.0–10.5)
nRBC: 0 % (ref 0.0–0.2)

## 2021-02-27 LAB — COMPREHENSIVE METABOLIC PANEL
ALT: 10 U/L (ref 0–44)
AST: 32 U/L (ref 15–41)
Albumin: 3 g/dL — ABNORMAL LOW (ref 3.5–5.0)
Alkaline Phosphatase: 84 U/L (ref 38–126)
Anion gap: 12 (ref 5–15)
BUN: 14 mg/dL (ref 8–23)
CO2: 18 mmol/L — ABNORMAL LOW (ref 22–32)
Calcium: 9.3 mg/dL (ref 8.9–10.3)
Chloride: 110 mmol/L (ref 98–111)
Creatinine, Ser: 1.5 mg/dL — ABNORMAL HIGH (ref 0.61–1.24)
GFR, Estimated: 46 mL/min — ABNORMAL LOW (ref >60)
Glucose, Bld: 130 mg/dL — ABNORMAL HIGH (ref 70–99)
Potassium: 4.2 mmol/L (ref 3.5–5.1)
Sodium: 140 mmol/L (ref 135–145)
Total Bilirubin: 1.4 mg/dL — ABNORMAL HIGH (ref 0.3–1.2)
Total Protein: 6.3 g/dL — ABNORMAL LOW (ref 6.5–8.1)

## 2021-02-27 LAB — GLUCOSE, CAPILLARY
Glucose-Capillary: 120 mg/dL — ABNORMAL HIGH (ref 70–99)
Glucose-Capillary: 140 mg/dL — ABNORMAL HIGH (ref 70–99)

## 2021-02-27 IMAGING — US US ABDOMEN LIMITED
1 series · 14 of 25 positions shown · non-contrast
Comparison: None.

CLINICAL DATA: Hyperbilirubinemia

EXAM:
ULTRASOUND ABDOMEN LIMITED RIGHT UPPER QUADRANT

[Series 1: us abdomen limited ruq (liver/gb) · 14 of 55 slices shown]
[im 1/55]
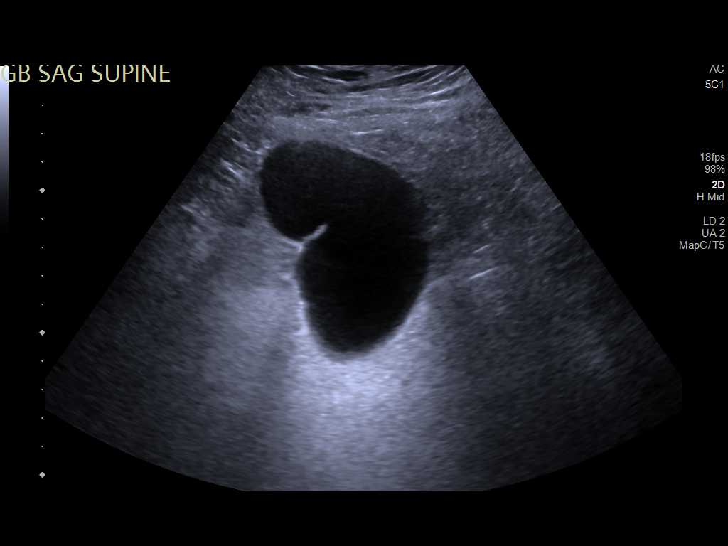
[im 5/55]
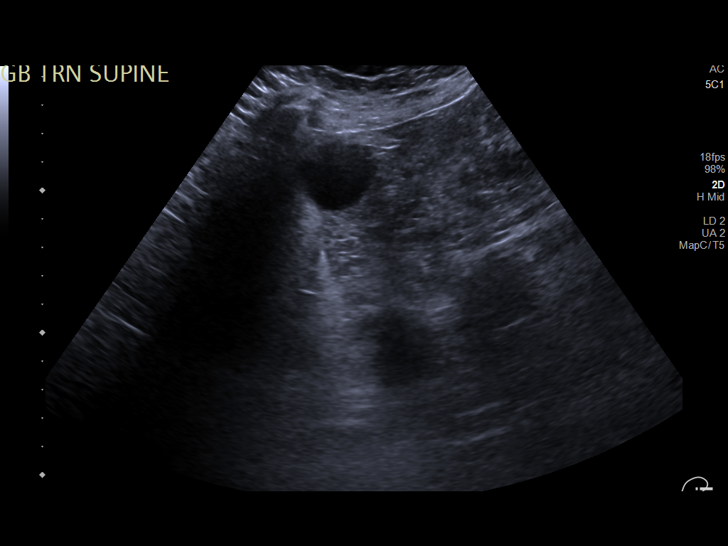
[im 10/55]
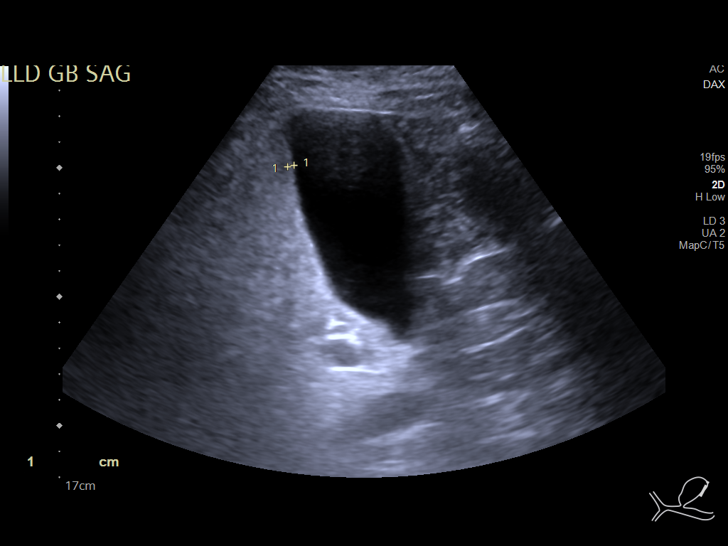
[im 14/55]
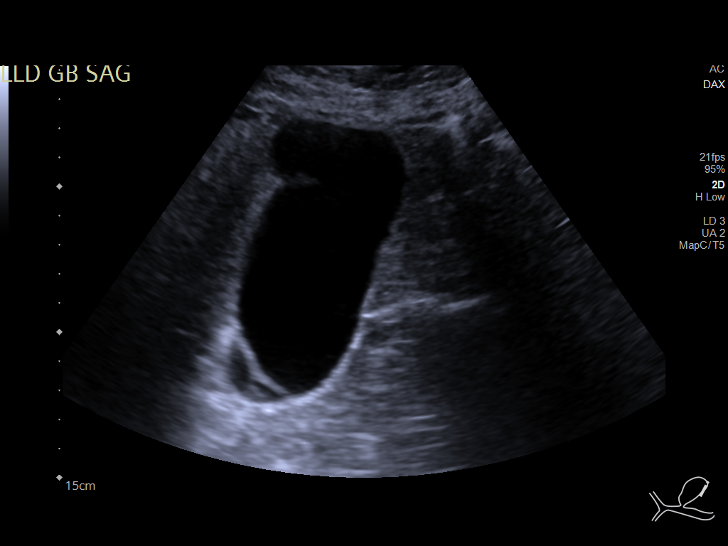
[im 19/55]
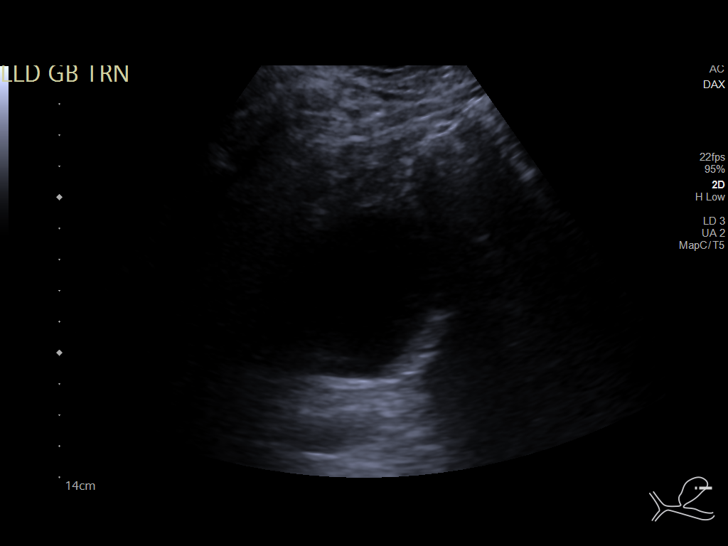
[im 21/55]
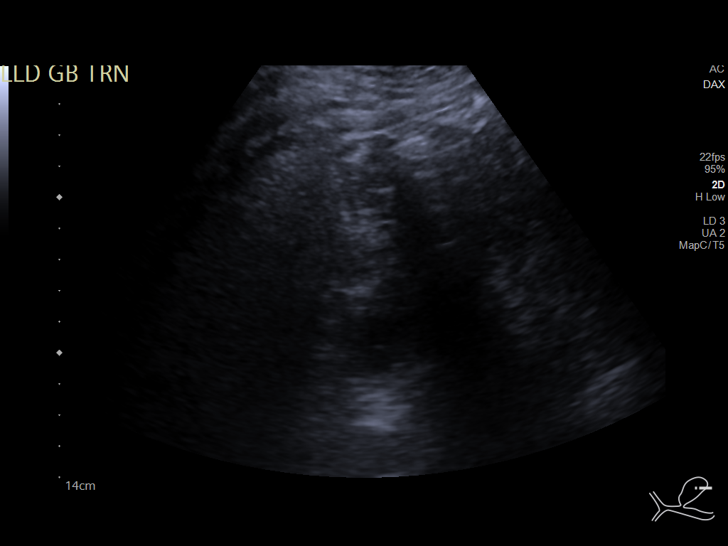
[im 25/55]
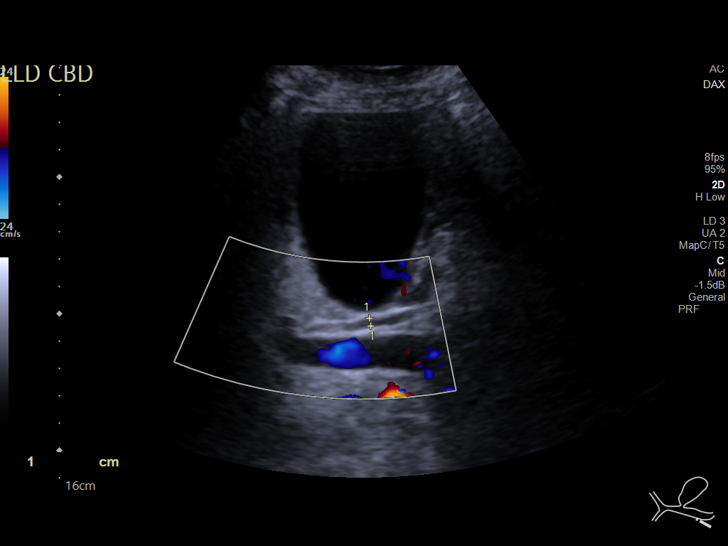
[im 30/55]
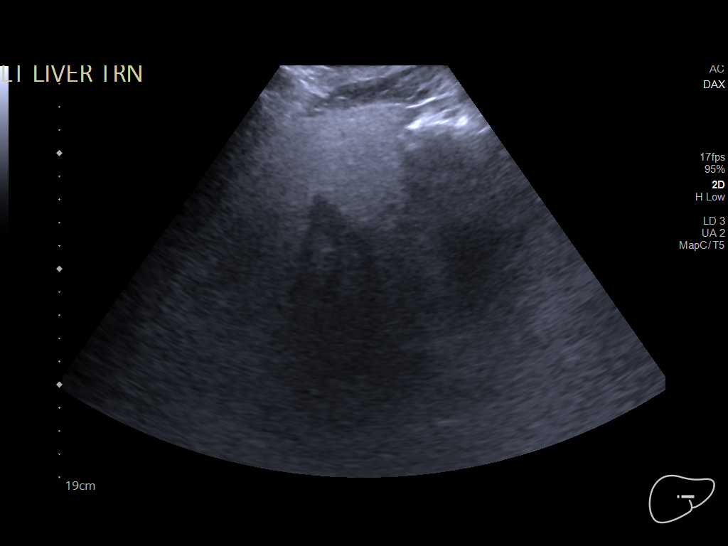
[im 34/55]
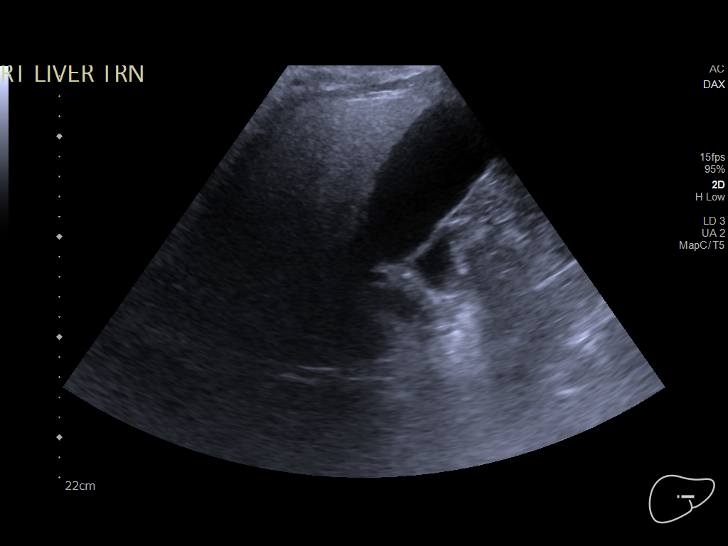
[im 37/55]
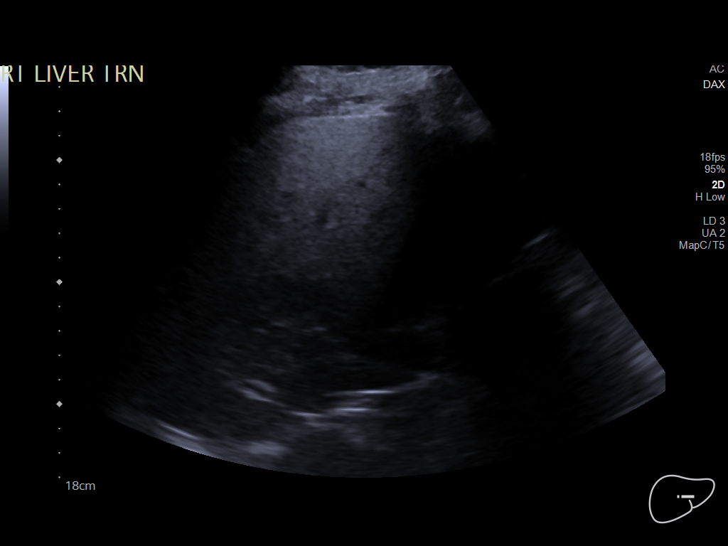
[im 41/55]
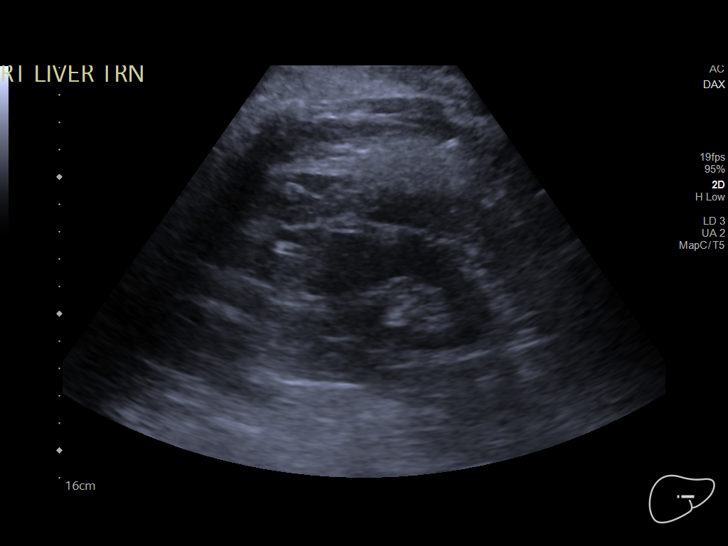
[im 46/55]
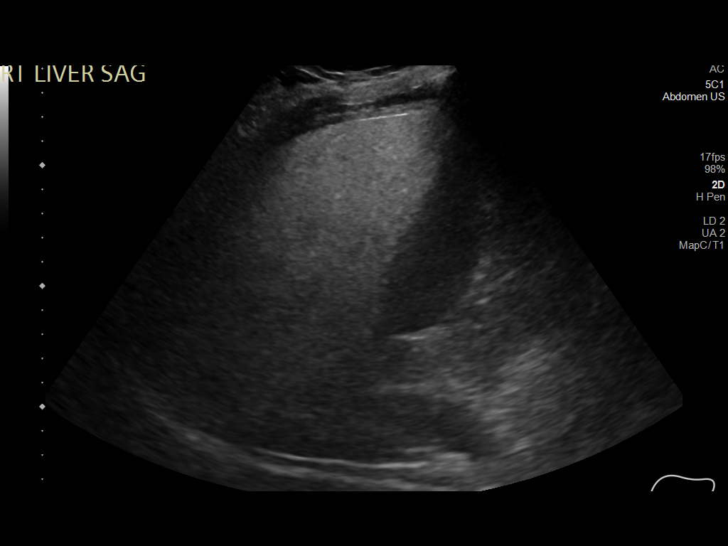
[im 50/55]
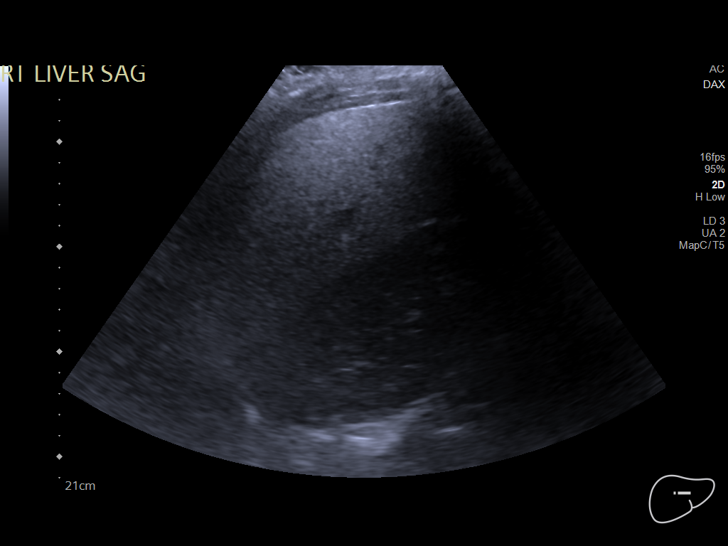
[im 55/55]
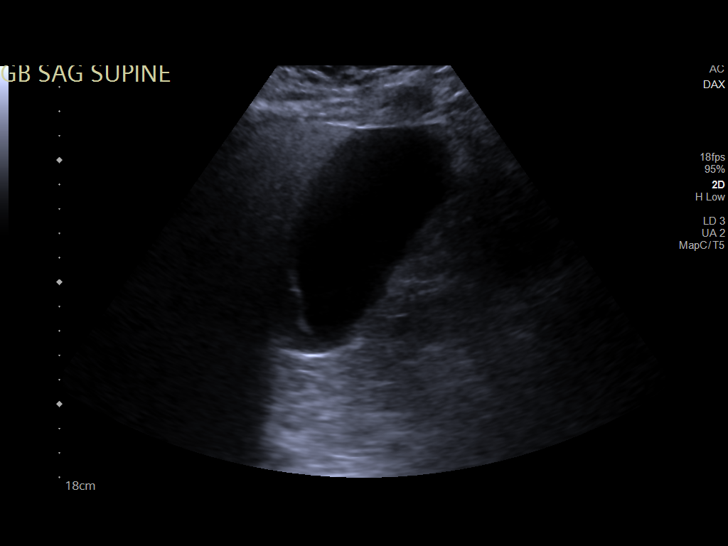

[14 of 25 positions shown; findings below may reference images not displayed]

FINDINGS: Gallbladder:

No gallstones or wall thickening visualized. No sonographic Murphy
sign noted by sonographer.

Common bile duct:

Diameter: 3 mm

Liver:

No focal lesion identified. Liver parenchyma shows diffusely
increased echogenicity. Portal vein is patent on color Doppler
imaging with normal direction of blood flow towards the liver.

Other: None.
IMPRESSION: Diffusely increased echotexture of liver parenchyma suggests fatty
deposition. No evidence for biliary dilatation.

## 2021-02-27 MED ORDER — CEPHALEXIN 500 MG PO CAPS: 500.0000 mg | ORAL_CAPSULE | Freq: Two times a day (BID) | ORAL | 0 refills | Status: AC

## 2021-02-27 NOTE — Discharge Summary (Signed)
Physician Discharge Summary   Patient: [name] MRN: [id number] DOB: 1940-12-16  Admit date:     02/25/2021  Discharge date: 02/27/21  Discharge Physician: [name]   PCP: Dennis Orn, MD   Recommendations at discharge:  Follow up with PCP and/or ophthalmology, Dr. Midge Kennedy, for consideration of alternative agent from diamox if confusion does not continue improving completely back to baseline. Recheck CMP suggested.  Discharge Diagnoses: Principal Problem:   Encephalopathy acute Active Problems:   Diabetes mellitus type 2 in obese (HCC)   Glaucoma   PAF (paroxysmal atrial fibrillation) (HCC)   Chronic kidney disease, stage 3a (HCC)   Essential hypertension   HLD (hyperlipidemia)   Obstructive sleep apnea   Leukocytosis  Hospital Course: [name] is a 81 y.o. male with medical history significant of PAF on eliquis,  T2DM, HTN, HLD, CKD stage 3, OSA presenting with week long history of weakness, lethargy and sleepy over the past week, urinating on himself and not acting himself. He was afebrile with stable vital signs, WBC 11.8k, lactic acid 2.6, bicarb 17, creatinine slightly above baseline at 1.48 (1.2 - 1.3), ammonia 43. CTH: no definite acute finding. New discrete area of encephalomalacia in periventricular right temporal lobe, compatible with interval but remote appearing insult. Similar small cortical infarct in left frontal lobe. 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 No acute intracranial pathology was noted.    Urinalysis revealed pyuria for which urine culture was sent and antibiotics started. With supportive care, the patient's confusion resolved. He has maximized benefit of inpatient management, will be discharged on oral antibiotics with return precautions.  Assessment and Plan: * Encephalopathy acute- (present on admission) Suspect  metabolic etiology with negative acute neuroimaging. Though imaging does suggest atrophy, previous strokes/microangiopathy predisposing to AMS with the right precipitant. - Continue antibiotics for UTI, started ceftriaxone with improvement, complete keflex at home. Urine culture no growth to date. - Mild ammonia elevation noted, previous CT abd/pelvis in 2015 showed fatty liver, though LFTs are not elevated, no clinical evidence of cirrhosis. RUQ U/S checked on day of discharge due to hyperbilirubinemia of unclear significance. No cholestasis noted, though fatty liver confirmed. We will monitor mentation without lactulose for now. - Having started acetazolamide prior to onset, with side effects including confusion, fatigue, drowsiness. If not continuing improvement at home, not returning to baseline, would recommend alternative agent. This was discussed in detail with patient and family who state [name] is aware and planning on following up.   - Continue long term oxycodone 5mg  BID to avoid withdrawal.  Leukocytosis- (present on admission) Mild, resolved with antibiotics.  Obstructive sleep apnea- (present on admission) Per wife, no CPAP was recommended after outpatient sleep studies. VBG without significant hypercarbia.  HLD (hyperlipidemia)- (present on admission) Lipid panel in 10/2020 LDL of 68, TC: 155, HDL 38, TG: 305  Essential hypertension- (present on admission) Blood pressures have been well controlled - Continue home medication of coreg. (PCP holding norvasc and lasix due to hypotension while on diamox)  Chronic kidney disease, stage 3a ([name])- (present on admission) Baseline of 1.2-1.4, at 1.48 on admission and has improved to baseline with IVF.  - Trend BMP at follow up, avoid nephrotoxins   PAF (paroxysmal atrial fibrillation) ([name])- (present on admission) In NSR  Continue eliquis and coreg   Glaucoma- (present on admission) No current pain.  - Continue gtt's and  acetazolamide 250mg  QID.    Diabetes mellitus type 2 in obese ([name])- (present on admission) HbA1c 7.0% last month.  - Cotninue home Tx.  Consultants: None Procedures performed: None  Disposition: Home Diet recommendation:  Discharge Diet Orders (From admission, onward)     Start     Ordered   02/27/21 0000  Diet - low sodium heart healthy        02/27/21 1405           Cardiac and Carb modified diet  DISCHARGE MEDICATION: Allergies as of 02/27/2021       Reactions   Ambien [zolpidem Tartrate]    "lost his mind"   Amlodipine Besylate Other (See Comments)   Other reaction(s): edema edema   Aspirin    Bleeding ulcer   Dorzolamide Hcl-timolol Mal    Other reaction(s): Other (See Comments), red and burning        Medication List     STOP taking these medications    amLODipine 5 MG tablet Commonly known as: NORVASC   furosemide 40 MG tablet Commonly known as: LASIX   metoprolol tartrate 25 MG tablet Commonly known as: LOPRESSOR       TAKE these medications    acetaZOLAMIDE 250 MG tablet Commonly known as: DIAMOX Take 250 mg by mouth 4 (four) times daily.   apixaban 5 MG Tabs tablet Commonly known as: ELIQUIS Take 5 mg by mouth 2 (two) times daily. What changed: Another medication with the same name was removed. Continue taking this medication, and follow the directions you see here.   carvedilol 6.25 MG tablet Commonly known as: COREG Take 6.25 mg by mouth 2 (two) times daily with a meal.   cephALEXin 500 MG capsule Commonly known as: KEFLEX Take 1 capsule (500 mg total) by mouth 2 (two) times daily for 5 days. Start taking on: February 28, 2021   Combigan 0.2-0.5 % ophthalmic solution Generic drug: brimonidine-timolol Place 1 drop into the right eye 2 (two) times daily.   DULoxetine 60 MG capsule Commonly known as: CYMBALTA Take 60 mg by mouth daily.   gabapentin 600 MG tablet Commonly known as: NEURONTIN Take 600 mg by mouth 2 (two)  times daily.   metFORMIN 500 MG tablet Commonly known as: GLUCOPHAGE Take 1,000 mg by mouth 2 (two) times daily with a meal.   multivitamin tablet Take 1 tablet by mouth daily.   ofloxacin 0.3 % ophthalmic solution Commonly known as: OCUFLOX Place 1 drop into the right eye 4 (four) times daily.   oxyCODONE-acetaminophen 5-325 MG tablet Commonly known as: PERCOCET/ROXICET Take 1 tablet by mouth in the morning and at bedtime.   polyethylene glycol 17 g packet Commonly known as: MIRALAX / GLYCOLAX Take 17 g by mouth daily as needed for moderate constipation.   Potassium Chloride ER 20 MEQ Tbcr Take 1 tablet by mouth daily.   prednisoLONE acetate 1 % ophthalmic suspension Commonly known as: PRED FORTE Place 1 drop into the right eye 4 (four) times daily.   Rocklatan 0.02-0.005 % Soln Generic drug: Netarsudil-Latanoprost Place 1 drop into both eyes at bedtime.   traZODone 150 MG tablet Commonly known as: DESYREL Take 50 mg by mouth at bedtime. Take along with 100 mg tablet=150 mg   traZODone 100 MG tablet Commonly known as: DESYREL Take 100 mg by mouth at bedtime. Take along with 50 mg TGGYIR=485 mg   Trulicity 1.5 IO/2.7OJ Sopn Generic drug: Dulaglutide Inject 1.5 mg into the skin every Saturday.  Follow-up Information     Dennis Orn, MD Follow up.   Specialty: Internal Medicine Contact information: [address], [address] [place] [address] [phone]                Feels well, cracking jokes, completely oriented, much better than arrival per himself and his wife. Discharge Exam: BP (!) 141/64 (BP Location: Left Arm)    Pulse (!) 54    Temp 98.7 F (37.1 C) (Oral)    Resp 18    SpO2 100%   Gen: Elderly, pleasant, chronically ill-appearing male in no distress Pulm: Nonlabored breathing room air. Clear. CV: Regular rate and rhythm. No murmur, rub, or gallop. No JVD, no pitting dependent edema. GI: Abdomen soft, protuberant but  non-tender, non-distended, with normoactive bowel sounds.  Skin: No new rashes, lesions or ulcers on visualized skin. Neuro: Alert and oriented. No focal neurological deficits. Psych: Judgement and insight appear fair. Mood euthymic & affect congruent. Behavior is appropriate.    Condition at discharge: stable  The results of significant diagnostics from this hospitalization (including imaging, microbiology, ancillary and laboratory) are listed below for reference.   Imaging Studies: CT Head Wo Contrast  Result Date: 02/25/2021 CLINICAL DATA:  Altered mental status, nontraumatic (Ped 0-17y) EXAM: CT HEAD WITHOUT CONTRAST TECHNIQUE: Contiguous axial images were obtained from the base of the skull through the vertex without intravenous contrast. RADIATION DOSE REDUCTION: This exam was performed according to the departmental dose-optimization program which includes automated exposure control, adjustment of the mA and/or kV according to patient size and/or use of iterative reconstruction technique. COMPARISON:  CT and MRI 01/06/2020. FINDINGS: Brain: discrete area of encephalomalacia in the periventricular right temporal lobe. No evidence of acute large vascular territory infarction, hemorrhage, hydrocephalus, extra-axial collection or mass lesion/mass effect. 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 Similar cerebral atrophy with ex vacuo ventricular dilation. Vascular: No hyperdense vessel identified. Calcific intracranial atherosclerosis. Skull: No acute fracture. Sinuses/Orbits: Clear sinuses.  Unremarkable orbits. Other: No mastoid effusions. IMPRESSION: 1. No definite acute abnormality. 2. New discrete area of encephalomalacia in the periventricular right temporal lobe, compatible with interval but remote appearing insult. MRI could further evaluate if clinically indicated. 3. Similar small cortical infarct in the left frontal lobe. Electronically Signed   By: Margaretha Sheffield M.D.    On: 02/25/2021 13:00   MR BRAIN WO CONTRAST  Result Date: 02/25/2021 CLINICAL DATA:  Altered mental status, concern for stroke EXAM: MRI HEAD WITHOUT CONTRAST TECHNIQUE: Multiplanar, multiecho pulse sequences of the brain and surrounding structures were obtained without intravenous contrast. COMPARISON:  Same-day noncontrast CT head FINDINGS: Brain: There is no evidence of acute intracranial hemorrhage, extra-axial fluid collection, or acute infarct There is mild global parenchymal volume loss with prominence of the ventricular system and extra-axial CSF spaces. There is a small remote cortical infarct in the left middle frontal gyrus (7-21). There is an additional remote infarct in the right temporal lobe as seen on the prior CT, new since the prior study from 01/06/2020. Otherwise, there is a mild burden of white matter signal abnormality likely reflecting mild chronic white matter microangiopathy. There is no mass lesion.  There is no midline shift. Vascular: Normal flow voids. Skull and upper cervical spine: Normal marrow signal. Sinuses/Orbits: The paranasal sinuses are clear. Bilateral lens implants are in place. The globes and orbits are otherwise unremarkable. Other: There are bilateral mastoid effusions. IMPRESSION: 1. No acute intracranial pathology. 2. Small remote cortical infarcts in  the left frontal lobe and right temporal lobe on a background of mild parenchymal volume loss and chronic white matter microangiopathy. 3. Bilateral mastoid effusions. Electronically Signed   By: Valetta Mole M.D.   On: 02/25/2021 15:50   DG CHEST PORT 1 VIEW  Result Date: 02/25/2021 CLINICAL DATA:  Altered mental status.  UTI. EXAM: PORTABLE CHEST 1 VIEW COMPARISON:  12/29/2019 FINDINGS: 1420 hours. The cardio pericardial silhouette is enlarged. The lungs are clear without focal pneumonia, edema, pneumothorax or pleural effusion. The visualized bony structures of the thorax show no acute abnormality. Telemetry  leads overlie the chest. IMPRESSION: No active disease. Electronically Signed   By: Misty Stanley M.D.   On: 02/25/2021 14:35   US Abdomen Limited RUQ (LIVER/GB)  Result Date: 02/27/2021 CLINICAL DATA:  Hyperbilirubinemia EXAM: ULTRASOUND ABDOMEN LIMITED RIGHT UPPER QUADRANT COMPARISON:  None. FINDINGS: Gallbladder: No gallstones or wall thickening visualized. No sonographic Murphy sign noted by sonographer. Common bile duct: Diameter: 3 mm Liver: No focal lesion identified. Liver parenchyma shows diffusely increased echogenicity. Portal vein is patent on color Doppler imaging with normal direction of blood flow towards the liver. Other: None. IMPRESSION: Diffusely increased echotexture of liver parenchyma suggests fatty deposition. No evidence for biliary dilatation. Electronically Signed   By: Misty Stanley M.D.   On: 02/27/2021 10:49    Microbiology: Results for orders placed or performed during the hospital encounter of 02/25/21  Urine Culture     Status: None   Collection Time: 02/25/21 12:13 PM   Specimen: Urine, Clean Catch  Result Value Ref Range Status   Specimen Description URINE, CLEAN CATCH  Final   Special Requests NONE  Final   Culture   Final    NO GROWTH Performed at [facility], [place] [address] [place], [place] [address]    Report Status 02/27/2021 FINAL  Final  Resp Panel by RT-PCR (Flu A&B, Covid) Nasopharyngeal Swab     Status: None   Collection Time: 02/25/21 12:13 PM   Specimen: Nasopharyngeal Swab; Nasopharyngeal(NP) swabs in vial transport medium  Result Value Ref Range Status   SARS Coronavirus 2 by RT PCR NEGATIVE NEGATIVE Final    Comment: (NOTE) SARS-CoV-2 target nucleic acids are NOT DETECTED.  The SARS-CoV-2 RNA is generally detectable in upper respiratory specimens during the acute phase of infection. The lowest concentration of SARS-CoV-2 viral copies this assay can detect is 138 copies/mL. A negative result does not preclude SARS-Cov-2 infection  and should not be used as the sole basis for treatment or other patient management decisions. A negative result may occur with  improper specimen collection/handling, submission of specimen other than nasopharyngeal swab, presence of viral mutation(s) within the areas targeted by this assay, and inadequate number of viral copies(<138 copies/mL). A negative result must be combined with clinical observations, patient history, and epidemiological information. The expected result is Negative.  Fact Sheet for Patients:  EntrepreneurPulse.com.au  Fact Sheet for Healthcare Providers:  IncredibleEmployment.be  This test is no t yet approved or cleared by the [place] FDA and  has been authorized for detection and/or diagnosis of SARS-CoV-2 by FDA under an Emergency Use Authorization (EUA). This EUA will remain  in effect (meaning this test can be used) for the duration of the COVID-19 declaration under Section 564(b)(1) of the Act, 21 U.S.C.section 360bbb-3(b)(1), unless the authorization is terminated  or revoked sooner.       Influenza A by PCR NEGATIVE NEGATIVE Final   Influenza B by PCR NEGATIVE  NEGATIVE Final    Comment: (NOTE) The Xpert Xpress SARS-CoV-2/FLU/RSV plus assay is intended as an aid in the diagnosis of influenza from Nasopharyngeal swab specimens and should not be used as a sole basis for treatment. Nasal washings and aspirates are unacceptable for Xpert Xpress SARS-CoV-2/FLU/RSV testing.  Fact Sheet for Patients: EntrepreneurPulse.com.au  Fact Sheet for Healthcare Providers: IncredibleEmployment.be  This test is not yet approved or cleared by the [place] FDA and has been authorized for detection and/or diagnosis of SARS-CoV-2 by FDA under an Emergency Use Authorization (EUA). This EUA will remain in effect (meaning this test can be used) for the duration of the COVID-19 declaration  under Section 564(b)(1) of the Act, 21 U.S.C. section 360bbb-3(b)(1), unless the authorization is terminated or revoked.  Performed at [facility], [place] [address], [place], [place] [address]   Culture, blood (routine x 2)     Status: None (Preliminary result)   Collection Time: 02/25/21  8:30 PM   Specimen: BLOOD RIGHT HAND  Result Value Ref Range Status   Specimen Description BLOOD RIGHT HAND  Final   Special Requests   Final    AEROBIC BOTTLE ONLY Blood Culture results may not be optimal due to an inadequate volume of blood received in culture bottles   Culture   Final    NO GROWTH 2 DAYS Performed at [facility], [place] [address] [place], [place] [address]    Report Status PENDING  Incomplete  Culture, blood (routine x 2)     Status: None (Preliminary result)   Collection Time: 02/25/21  8:34 PM   Specimen: BLOOD  Result Value Ref Range Status   Specimen Description BLOOD LEFT ANTECUBITAL  Final   Special Requests   Final    AEROBIC BOTTLE ONLY Blood Culture results may not be optimal due to an inadequate volume of blood received in culture bottles   Culture   Final    NO GROWTH 2 DAYS Performed at [facility], [place] [address] [place], [place] [address]    Report Status PENDING  Incomplete    Labs: CBC: Recent Labs  Lab 02/25/21 1252 02/26/21 0509 02/27/21 0256  WBC 11.8* 12.1* 10.2  NEUTROABS 7.3  --   --   HGB 13.9 14.3 13.5  HCT 43.5 43.1 42.4  MCV 95.0 90.4 93.8  PLT 215 222 836   Basic Metabolic Panel: Recent Labs  Lab 02/25/21 1252 02/26/21 0509 02/27/21 0256  NA 141 139 140  K 4.1 4.7 4.2  CL 113* 110 110  CO2 17* 17* 18*  GLUCOSE 144* 124* 130*  BUN 12 13 14   CREATININE 1.48* 1.31* 1.50*  CALCIUM 9.4 9.5 9.3   Liver Function Tests: Recent Labs  Lab 02/25/21 1252 02/27/21 0256  AST 21 32  ALT 11 10  ALKPHOS 87 84  BILITOT 0.5 1.4*  PROT 6.4* 6.3*  ALBUMIN 3.0* 3.0*   CBG: Recent Labs  Lab 02/26/21 1122  02/26/21 1642 02/26/21 2031 02/27/21 0619 02/27/21 1142  GLUCAP 189* 175* 151* 120* 140*    Discharge time spent: greater than 30 minutes.  Signed: Patrecia Pour, MD [facility] 02/27/2021

## 2021-02-27 NOTE — Progress Notes (Signed)
°  Transition of Care [facility]) Screening Note   Patient Details  Name: [name] Date of Birth: 03/05/1940   Transition of [facility]) CM/SW Contact:    Geralynn Ochs, LCSW Phone Number: 02/27/2021, 10:01 AM    Transition of Care Department [facility]) has reviewed patient and no TOC needs have been identified at this time; medical workup ongoing. We will continue to monitor patient advancement through interdisciplinary progression rounds. If new patient transition needs arise, please place a TOC consult.

## 2021-03-02 DIAGNOSIS — I129 Hypertensive chronic kidney disease with stage 1 through stage 4 chronic kidney disease, or unspecified chronic kidney disease: Secondary | ICD-10-CM | POA: Diagnosis not present

## 2021-03-02 DIAGNOSIS — E1122 Type 2 diabetes mellitus with diabetic chronic kidney disease: Secondary | ICD-10-CM | POA: Diagnosis not present

## 2021-03-02 DIAGNOSIS — I480 Paroxysmal atrial fibrillation: Secondary | ICD-10-CM | POA: Diagnosis not present

## 2021-03-02 DIAGNOSIS — E872 Acidosis, unspecified: Secondary | ICD-10-CM | POA: Diagnosis not present

## 2021-03-02 DIAGNOSIS — R531 Weakness: Secondary | ICD-10-CM | POA: Diagnosis not present

## 2021-03-02 DIAGNOSIS — N1832 Chronic kidney disease, stage 3b: Secondary | ICD-10-CM | POA: Diagnosis not present

## 2021-03-02 DIAGNOSIS — G9341 Metabolic encephalopathy: Secondary | ICD-10-CM | POA: Diagnosis not present

## 2021-03-02 LAB — CULTURE, BLOOD (ROUTINE X 2)
Culture: NO GROWTH
Culture: NO GROWTH

## 2021-03-08 DIAGNOSIS — G9341 Metabolic encephalopathy: Secondary | ICD-10-CM | POA: Diagnosis not present

## 2021-03-08 DIAGNOSIS — I480 Paroxysmal atrial fibrillation: Secondary | ICD-10-CM | POA: Diagnosis not present

## 2021-03-08 DIAGNOSIS — Z794 Long term (current) use of insulin: Secondary | ICD-10-CM | POA: Diagnosis not present

## 2021-03-08 DIAGNOSIS — E1122 Type 2 diabetes mellitus with diabetic chronic kidney disease: Secondary | ICD-10-CM | POA: Diagnosis not present

## 2021-03-08 DIAGNOSIS — I129 Hypertensive chronic kidney disease with stage 1 through stage 4 chronic kidney disease, or unspecified chronic kidney disease: Secondary | ICD-10-CM | POA: Diagnosis not present

## 2021-03-08 DIAGNOSIS — N1832 Chronic kidney disease, stage 3b: Secondary | ICD-10-CM | POA: Diagnosis not present

## 2021-03-08 DIAGNOSIS — R531 Weakness: Secondary | ICD-10-CM | POA: Diagnosis not present

## 2021-03-10 DIAGNOSIS — H401113 Primary open-angle glaucoma, right eye, severe stage: Secondary | ICD-10-CM | POA: Diagnosis not present

## 2021-03-10 DIAGNOSIS — H401123 Primary open-angle glaucoma, left eye, severe stage: Secondary | ICD-10-CM | POA: Diagnosis not present

## 2021-03-14 DIAGNOSIS — E1122 Type 2 diabetes mellitus with diabetic chronic kidney disease: Secondary | ICD-10-CM | POA: Diagnosis not present

## 2021-03-14 DIAGNOSIS — R531 Weakness: Secondary | ICD-10-CM | POA: Diagnosis not present

## 2021-03-14 DIAGNOSIS — I480 Paroxysmal atrial fibrillation: Secondary | ICD-10-CM | POA: Diagnosis not present

## 2021-03-14 DIAGNOSIS — R7989 Other specified abnormal findings of blood chemistry: Secondary | ICD-10-CM | POA: Diagnosis not present

## 2021-03-14 DIAGNOSIS — I129 Hypertensive chronic kidney disease with stage 1 through stage 4 chronic kidney disease, or unspecified chronic kidney disease: Secondary | ICD-10-CM | POA: Diagnosis not present

## 2021-03-14 DIAGNOSIS — G9341 Metabolic encephalopathy: Secondary | ICD-10-CM | POA: Diagnosis not present

## 2021-03-14 DIAGNOSIS — Z794 Long term (current) use of insulin: Secondary | ICD-10-CM | POA: Diagnosis not present

## 2021-03-14 DIAGNOSIS — N1832 Chronic kidney disease, stage 3b: Secondary | ICD-10-CM | POA: Diagnosis not present

## 2021-03-17 DIAGNOSIS — I480 Paroxysmal atrial fibrillation: Secondary | ICD-10-CM | POA: Diagnosis not present

## 2021-03-17 DIAGNOSIS — N1832 Chronic kidney disease, stage 3b: Secondary | ICD-10-CM | POA: Diagnosis not present

## 2021-03-17 DIAGNOSIS — E1122 Type 2 diabetes mellitus with diabetic chronic kidney disease: Secondary | ICD-10-CM | POA: Diagnosis not present

## 2021-03-17 DIAGNOSIS — I129 Hypertensive chronic kidney disease with stage 1 through stage 4 chronic kidney disease, or unspecified chronic kidney disease: Secondary | ICD-10-CM | POA: Diagnosis not present

## 2021-03-17 DIAGNOSIS — Z794 Long term (current) use of insulin: Secondary | ICD-10-CM | POA: Diagnosis not present

## 2021-03-17 DIAGNOSIS — R531 Weakness: Secondary | ICD-10-CM | POA: Diagnosis not present

## 2021-03-17 DIAGNOSIS — G9341 Metabolic encephalopathy: Secondary | ICD-10-CM | POA: Diagnosis not present

## 2021-03-18 DIAGNOSIS — G9341 Metabolic encephalopathy: Secondary | ICD-10-CM | POA: Diagnosis not present

## 2021-03-18 DIAGNOSIS — Z794 Long term (current) use of insulin: Secondary | ICD-10-CM | POA: Diagnosis not present

## 2021-03-18 DIAGNOSIS — R531 Weakness: Secondary | ICD-10-CM | POA: Diagnosis not present

## 2021-03-18 DIAGNOSIS — N1832 Chronic kidney disease, stage 3b: Secondary | ICD-10-CM | POA: Diagnosis not present

## 2021-03-18 DIAGNOSIS — I480 Paroxysmal atrial fibrillation: Secondary | ICD-10-CM | POA: Diagnosis not present

## 2021-03-18 DIAGNOSIS — E1122 Type 2 diabetes mellitus with diabetic chronic kidney disease: Secondary | ICD-10-CM | POA: Diagnosis not present

## 2021-03-18 DIAGNOSIS — I129 Hypertensive chronic kidney disease with stage 1 through stage 4 chronic kidney disease, or unspecified chronic kidney disease: Secondary | ICD-10-CM | POA: Diagnosis not present

## 2021-03-21 DIAGNOSIS — Z794 Long term (current) use of insulin: Secondary | ICD-10-CM | POA: Diagnosis not present

## 2021-03-21 DIAGNOSIS — H900 Conductive hearing loss, bilateral: Secondary | ICD-10-CM | POA: Diagnosis not present

## 2021-03-21 DIAGNOSIS — R531 Weakness: Secondary | ICD-10-CM | POA: Diagnosis not present

## 2021-03-21 DIAGNOSIS — I129 Hypertensive chronic kidney disease with stage 1 through stage 4 chronic kidney disease, or unspecified chronic kidney disease: Secondary | ICD-10-CM | POA: Diagnosis not present

## 2021-03-21 DIAGNOSIS — N1831 Chronic kidney disease, stage 3a: Secondary | ICD-10-CM | POA: Diagnosis not present

## 2021-03-21 DIAGNOSIS — E1122 Type 2 diabetes mellitus with diabetic chronic kidney disease: Secondary | ICD-10-CM | POA: Diagnosis not present

## 2021-03-21 DIAGNOSIS — R296 Repeated falls: Secondary | ICD-10-CM | POA: Diagnosis not present

## 2021-03-21 DIAGNOSIS — G9341 Metabolic encephalopathy: Secondary | ICD-10-CM | POA: Diagnosis not present

## 2021-03-21 DIAGNOSIS — H6123 Impacted cerumen, bilateral: Secondary | ICD-10-CM | POA: Diagnosis not present

## 2021-03-21 DIAGNOSIS — N1832 Chronic kidney disease, stage 3b: Secondary | ICD-10-CM | POA: Diagnosis not present

## 2021-03-21 DIAGNOSIS — I480 Paroxysmal atrial fibrillation: Secondary | ICD-10-CM | POA: Diagnosis not present

## 2021-03-23 DIAGNOSIS — N1832 Chronic kidney disease, stage 3b: Secondary | ICD-10-CM | POA: Diagnosis not present

## 2021-03-23 DIAGNOSIS — R531 Weakness: Secondary | ICD-10-CM | POA: Diagnosis not present

## 2021-03-23 DIAGNOSIS — I480 Paroxysmal atrial fibrillation: Secondary | ICD-10-CM | POA: Diagnosis not present

## 2021-03-23 DIAGNOSIS — Z794 Long term (current) use of insulin: Secondary | ICD-10-CM | POA: Diagnosis not present

## 2021-03-23 DIAGNOSIS — I129 Hypertensive chronic kidney disease with stage 1 through stage 4 chronic kidney disease, or unspecified chronic kidney disease: Secondary | ICD-10-CM | POA: Diagnosis not present

## 2021-03-23 DIAGNOSIS — G9341 Metabolic encephalopathy: Secondary | ICD-10-CM | POA: Diagnosis not present

## 2021-03-23 DIAGNOSIS — E1122 Type 2 diabetes mellitus with diabetic chronic kidney disease: Secondary | ICD-10-CM | POA: Diagnosis not present

## 2021-03-25 DIAGNOSIS — E1122 Type 2 diabetes mellitus with diabetic chronic kidney disease: Secondary | ICD-10-CM | POA: Diagnosis not present

## 2021-03-25 DIAGNOSIS — I129 Hypertensive chronic kidney disease with stage 1 through stage 4 chronic kidney disease, or unspecified chronic kidney disease: Secondary | ICD-10-CM | POA: Diagnosis not present

## 2021-03-25 DIAGNOSIS — R531 Weakness: Secondary | ICD-10-CM | POA: Diagnosis not present

## 2021-03-25 DIAGNOSIS — G9341 Metabolic encephalopathy: Secondary | ICD-10-CM | POA: Diagnosis not present

## 2021-03-25 DIAGNOSIS — Z794 Long term (current) use of insulin: Secondary | ICD-10-CM | POA: Diagnosis not present

## 2021-03-25 DIAGNOSIS — I480 Paroxysmal atrial fibrillation: Secondary | ICD-10-CM | POA: Diagnosis not present

## 2021-03-25 DIAGNOSIS — N1832 Chronic kidney disease, stage 3b: Secondary | ICD-10-CM | POA: Diagnosis not present

## 2021-03-28 DIAGNOSIS — E1122 Type 2 diabetes mellitus with diabetic chronic kidney disease: Secondary | ICD-10-CM | POA: Diagnosis not present

## 2021-03-28 DIAGNOSIS — G9341 Metabolic encephalopathy: Secondary | ICD-10-CM | POA: Diagnosis not present

## 2021-03-28 DIAGNOSIS — Z794 Long term (current) use of insulin: Secondary | ICD-10-CM | POA: Diagnosis not present

## 2021-03-28 DIAGNOSIS — N1832 Chronic kidney disease, stage 3b: Secondary | ICD-10-CM | POA: Diagnosis not present

## 2021-03-28 DIAGNOSIS — R531 Weakness: Secondary | ICD-10-CM | POA: Diagnosis not present

## 2021-03-28 DIAGNOSIS — I480 Paroxysmal atrial fibrillation: Secondary | ICD-10-CM | POA: Diagnosis not present

## 2021-03-28 DIAGNOSIS — I129 Hypertensive chronic kidney disease with stage 1 through stage 4 chronic kidney disease, or unspecified chronic kidney disease: Secondary | ICD-10-CM | POA: Diagnosis not present

## 2021-03-29 DIAGNOSIS — I480 Paroxysmal atrial fibrillation: Secondary | ICD-10-CM | POA: Diagnosis not present

## 2021-03-29 DIAGNOSIS — N1832 Chronic kidney disease, stage 3b: Secondary | ICD-10-CM | POA: Diagnosis not present

## 2021-03-29 DIAGNOSIS — Z794 Long term (current) use of insulin: Secondary | ICD-10-CM | POA: Diagnosis not present

## 2021-03-29 DIAGNOSIS — R531 Weakness: Secondary | ICD-10-CM | POA: Diagnosis not present

## 2021-03-29 DIAGNOSIS — E1122 Type 2 diabetes mellitus with diabetic chronic kidney disease: Secondary | ICD-10-CM | POA: Diagnosis not present

## 2021-03-29 DIAGNOSIS — G9341 Metabolic encephalopathy: Secondary | ICD-10-CM | POA: Diagnosis not present

## 2021-03-29 DIAGNOSIS — I129 Hypertensive chronic kidney disease with stage 1 through stage 4 chronic kidney disease, or unspecified chronic kidney disease: Secondary | ICD-10-CM | POA: Diagnosis not present

## 2021-03-31 DIAGNOSIS — G9341 Metabolic encephalopathy: Secondary | ICD-10-CM | POA: Diagnosis not present

## 2021-03-31 DIAGNOSIS — I480 Paroxysmal atrial fibrillation: Secondary | ICD-10-CM | POA: Diagnosis not present

## 2021-03-31 DIAGNOSIS — R531 Weakness: Secondary | ICD-10-CM | POA: Diagnosis not present

## 2021-03-31 DIAGNOSIS — E1122 Type 2 diabetes mellitus with diabetic chronic kidney disease: Secondary | ICD-10-CM | POA: Diagnosis not present

## 2021-03-31 DIAGNOSIS — I129 Hypertensive chronic kidney disease with stage 1 through stage 4 chronic kidney disease, or unspecified chronic kidney disease: Secondary | ICD-10-CM | POA: Diagnosis not present

## 2021-03-31 DIAGNOSIS — Z794 Long term (current) use of insulin: Secondary | ICD-10-CM | POA: Diagnosis not present

## 2021-03-31 DIAGNOSIS — N1832 Chronic kidney disease, stage 3b: Secondary | ICD-10-CM | POA: Diagnosis not present

## 2021-04-01 DIAGNOSIS — G9341 Metabolic encephalopathy: Secondary | ICD-10-CM | POA: Diagnosis not present

## 2021-04-01 DIAGNOSIS — I480 Paroxysmal atrial fibrillation: Secondary | ICD-10-CM | POA: Diagnosis not present

## 2021-04-01 DIAGNOSIS — E1122 Type 2 diabetes mellitus with diabetic chronic kidney disease: Secondary | ICD-10-CM | POA: Diagnosis not present

## 2021-04-01 DIAGNOSIS — I129 Hypertensive chronic kidney disease with stage 1 through stage 4 chronic kidney disease, or unspecified chronic kidney disease: Secondary | ICD-10-CM | POA: Diagnosis not present

## 2021-04-01 DIAGNOSIS — R531 Weakness: Secondary | ICD-10-CM | POA: Diagnosis not present

## 2021-04-01 DIAGNOSIS — N1832 Chronic kidney disease, stage 3b: Secondary | ICD-10-CM | POA: Diagnosis not present

## 2021-04-01 DIAGNOSIS — Z794 Long term (current) use of insulin: Secondary | ICD-10-CM | POA: Diagnosis not present

## 2021-04-04 DIAGNOSIS — N1831 Chronic kidney disease, stage 3a: Secondary | ICD-10-CM | POA: Diagnosis not present

## 2021-04-04 DIAGNOSIS — E1122 Type 2 diabetes mellitus with diabetic chronic kidney disease: Secondary | ICD-10-CM | POA: Diagnosis not present

## 2021-04-04 DIAGNOSIS — I10 Essential (primary) hypertension: Secondary | ICD-10-CM | POA: Diagnosis not present

## 2021-04-04 DIAGNOSIS — G8929 Other chronic pain: Secondary | ICD-10-CM | POA: Diagnosis not present

## 2021-04-05 DIAGNOSIS — N1832 Chronic kidney disease, stage 3b: Secondary | ICD-10-CM | POA: Diagnosis not present

## 2021-04-05 DIAGNOSIS — R531 Weakness: Secondary | ICD-10-CM | POA: Diagnosis not present

## 2021-04-05 DIAGNOSIS — I480 Paroxysmal atrial fibrillation: Secondary | ICD-10-CM | POA: Diagnosis not present

## 2021-04-05 DIAGNOSIS — I129 Hypertensive chronic kidney disease with stage 1 through stage 4 chronic kidney disease, or unspecified chronic kidney disease: Secondary | ICD-10-CM | POA: Diagnosis not present

## 2021-04-05 DIAGNOSIS — G9341 Metabolic encephalopathy: Secondary | ICD-10-CM | POA: Diagnosis not present

## 2021-04-05 DIAGNOSIS — Z794 Long term (current) use of insulin: Secondary | ICD-10-CM | POA: Diagnosis not present

## 2021-04-05 DIAGNOSIS — E1122 Type 2 diabetes mellitus with diabetic chronic kidney disease: Secondary | ICD-10-CM | POA: Diagnosis not present

## 2021-04-08 DIAGNOSIS — R531 Weakness: Secondary | ICD-10-CM | POA: Diagnosis not present

## 2021-04-08 DIAGNOSIS — E1122 Type 2 diabetes mellitus with diabetic chronic kidney disease: Secondary | ICD-10-CM | POA: Diagnosis not present

## 2021-04-08 DIAGNOSIS — I480 Paroxysmal atrial fibrillation: Secondary | ICD-10-CM | POA: Diagnosis not present

## 2021-04-08 DIAGNOSIS — N1832 Chronic kidney disease, stage 3b: Secondary | ICD-10-CM | POA: Diagnosis not present

## 2021-04-08 DIAGNOSIS — I129 Hypertensive chronic kidney disease with stage 1 through stage 4 chronic kidney disease, or unspecified chronic kidney disease: Secondary | ICD-10-CM | POA: Diagnosis not present

## 2021-04-08 DIAGNOSIS — G9341 Metabolic encephalopathy: Secondary | ICD-10-CM | POA: Diagnosis not present

## 2021-04-08 DIAGNOSIS — Z794 Long term (current) use of insulin: Secondary | ICD-10-CM | POA: Diagnosis not present

## 2021-04-13 DIAGNOSIS — I129 Hypertensive chronic kidney disease with stage 1 through stage 4 chronic kidney disease, or unspecified chronic kidney disease: Secondary | ICD-10-CM | POA: Diagnosis not present

## 2021-04-13 DIAGNOSIS — N1832 Chronic kidney disease, stage 3b: Secondary | ICD-10-CM | POA: Diagnosis not present

## 2021-04-13 DIAGNOSIS — G9341 Metabolic encephalopathy: Secondary | ICD-10-CM | POA: Diagnosis not present

## 2021-04-13 DIAGNOSIS — I480 Paroxysmal atrial fibrillation: Secondary | ICD-10-CM | POA: Diagnosis not present

## 2021-04-13 DIAGNOSIS — Z794 Long term (current) use of insulin: Secondary | ICD-10-CM | POA: Diagnosis not present

## 2021-04-13 DIAGNOSIS — E1122 Type 2 diabetes mellitus with diabetic chronic kidney disease: Secondary | ICD-10-CM | POA: Diagnosis not present

## 2021-04-13 DIAGNOSIS — R531 Weakness: Secondary | ICD-10-CM | POA: Diagnosis not present

## 2021-04-15 DIAGNOSIS — I129 Hypertensive chronic kidney disease with stage 1 through stage 4 chronic kidney disease, or unspecified chronic kidney disease: Secondary | ICD-10-CM | POA: Diagnosis not present

## 2021-04-15 DIAGNOSIS — E1122 Type 2 diabetes mellitus with diabetic chronic kidney disease: Secondary | ICD-10-CM | POA: Diagnosis not present

## 2021-04-15 DIAGNOSIS — I480 Paroxysmal atrial fibrillation: Secondary | ICD-10-CM | POA: Diagnosis not present

## 2021-04-15 DIAGNOSIS — N1832 Chronic kidney disease, stage 3b: Secondary | ICD-10-CM | POA: Diagnosis not present

## 2021-04-15 DIAGNOSIS — Z794 Long term (current) use of insulin: Secondary | ICD-10-CM | POA: Diagnosis not present

## 2021-04-15 DIAGNOSIS — G9341 Metabolic encephalopathy: Secondary | ICD-10-CM | POA: Diagnosis not present

## 2021-04-15 DIAGNOSIS — R531 Weakness: Secondary | ICD-10-CM | POA: Diagnosis not present

## 2021-04-19 DIAGNOSIS — I480 Paroxysmal atrial fibrillation: Secondary | ICD-10-CM | POA: Diagnosis not present

## 2021-04-19 DIAGNOSIS — E1122 Type 2 diabetes mellitus with diabetic chronic kidney disease: Secondary | ICD-10-CM | POA: Diagnosis not present

## 2021-04-19 DIAGNOSIS — Z794 Long term (current) use of insulin: Secondary | ICD-10-CM | POA: Diagnosis not present

## 2021-04-19 DIAGNOSIS — I129 Hypertensive chronic kidney disease with stage 1 through stage 4 chronic kidney disease, or unspecified chronic kidney disease: Secondary | ICD-10-CM | POA: Diagnosis not present

## 2021-04-19 DIAGNOSIS — R5381 Other malaise: Secondary | ICD-10-CM | POA: Diagnosis not present

## 2021-04-19 DIAGNOSIS — G9341 Metabolic encephalopathy: Secondary | ICD-10-CM | POA: Diagnosis not present

## 2021-04-19 DIAGNOSIS — N1832 Chronic kidney disease, stage 3b: Secondary | ICD-10-CM | POA: Diagnosis not present

## 2021-04-19 DIAGNOSIS — M21949 Unspecified acquired deformity of hand, unspecified hand: Secondary | ICD-10-CM | POA: Diagnosis not present

## 2021-04-19 DIAGNOSIS — R531 Weakness: Secondary | ICD-10-CM | POA: Diagnosis not present

## 2021-04-19 DIAGNOSIS — E1169 Type 2 diabetes mellitus with other specified complication: Secondary | ICD-10-CM | POA: Diagnosis not present

## 2021-04-19 DIAGNOSIS — I10 Essential (primary) hypertension: Secondary | ICD-10-CM | POA: Diagnosis not present

## 2021-04-20 DIAGNOSIS — I480 Paroxysmal atrial fibrillation: Secondary | ICD-10-CM | POA: Diagnosis not present

## 2021-04-20 DIAGNOSIS — Z794 Long term (current) use of insulin: Secondary | ICD-10-CM | POA: Diagnosis not present

## 2021-04-20 DIAGNOSIS — R531 Weakness: Secondary | ICD-10-CM | POA: Diagnosis not present

## 2021-04-20 DIAGNOSIS — N1832 Chronic kidney disease, stage 3b: Secondary | ICD-10-CM | POA: Diagnosis not present

## 2021-04-20 DIAGNOSIS — I129 Hypertensive chronic kidney disease with stage 1 through stage 4 chronic kidney disease, or unspecified chronic kidney disease: Secondary | ICD-10-CM | POA: Diagnosis not present

## 2021-04-20 DIAGNOSIS — G9341 Metabolic encephalopathy: Secondary | ICD-10-CM | POA: Diagnosis not present

## 2021-04-20 DIAGNOSIS — E1122 Type 2 diabetes mellitus with diabetic chronic kidney disease: Secondary | ICD-10-CM | POA: Diagnosis not present

## 2021-04-21 DIAGNOSIS — E1122 Type 2 diabetes mellitus with diabetic chronic kidney disease: Secondary | ICD-10-CM | POA: Diagnosis not present

## 2021-04-21 DIAGNOSIS — G9341 Metabolic encephalopathy: Secondary | ICD-10-CM | POA: Diagnosis not present

## 2021-04-21 DIAGNOSIS — I480 Paroxysmal atrial fibrillation: Secondary | ICD-10-CM | POA: Diagnosis not present

## 2021-04-21 DIAGNOSIS — N1832 Chronic kidney disease, stage 3b: Secondary | ICD-10-CM | POA: Diagnosis not present

## 2021-04-21 DIAGNOSIS — R531 Weakness: Secondary | ICD-10-CM | POA: Diagnosis not present

## 2021-04-21 DIAGNOSIS — Z794 Long term (current) use of insulin: Secondary | ICD-10-CM | POA: Diagnosis not present

## 2021-04-21 DIAGNOSIS — I129 Hypertensive chronic kidney disease with stage 1 through stage 4 chronic kidney disease, or unspecified chronic kidney disease: Secondary | ICD-10-CM | POA: Diagnosis not present

## 2021-04-22 DIAGNOSIS — E1122 Type 2 diabetes mellitus with diabetic chronic kidney disease: Secondary | ICD-10-CM | POA: Diagnosis not present

## 2021-04-22 DIAGNOSIS — N1832 Chronic kidney disease, stage 3b: Secondary | ICD-10-CM | POA: Diagnosis not present

## 2021-04-22 DIAGNOSIS — I480 Paroxysmal atrial fibrillation: Secondary | ICD-10-CM | POA: Diagnosis not present

## 2021-04-22 DIAGNOSIS — I129 Hypertensive chronic kidney disease with stage 1 through stage 4 chronic kidney disease, or unspecified chronic kidney disease: Secondary | ICD-10-CM | POA: Diagnosis not present

## 2021-04-22 DIAGNOSIS — Z794 Long term (current) use of insulin: Secondary | ICD-10-CM | POA: Diagnosis not present

## 2021-04-22 DIAGNOSIS — G9341 Metabolic encephalopathy: Secondary | ICD-10-CM | POA: Diagnosis not present

## 2021-04-22 DIAGNOSIS — R531 Weakness: Secondary | ICD-10-CM | POA: Diagnosis not present

## 2021-04-26 DIAGNOSIS — Z794 Long term (current) use of insulin: Secondary | ICD-10-CM | POA: Diagnosis not present

## 2021-04-26 DIAGNOSIS — R531 Weakness: Secondary | ICD-10-CM | POA: Diagnosis not present

## 2021-04-26 DIAGNOSIS — N1832 Chronic kidney disease, stage 3b: Secondary | ICD-10-CM | POA: Diagnosis not present

## 2021-04-26 DIAGNOSIS — G9341 Metabolic encephalopathy: Secondary | ICD-10-CM | POA: Diagnosis not present

## 2021-04-26 DIAGNOSIS — I129 Hypertensive chronic kidney disease with stage 1 through stage 4 chronic kidney disease, or unspecified chronic kidney disease: Secondary | ICD-10-CM | POA: Diagnosis not present

## 2021-04-26 DIAGNOSIS — I480 Paroxysmal atrial fibrillation: Secondary | ICD-10-CM | POA: Diagnosis not present

## 2021-04-26 DIAGNOSIS — E1122 Type 2 diabetes mellitus with diabetic chronic kidney disease: Secondary | ICD-10-CM | POA: Diagnosis not present

## 2021-04-27 DIAGNOSIS — G8929 Other chronic pain: Secondary | ICD-10-CM | POA: Diagnosis not present

## 2021-04-27 DIAGNOSIS — I10 Essential (primary) hypertension: Secondary | ICD-10-CM | POA: Diagnosis not present

## 2021-04-27 DIAGNOSIS — E781 Pure hyperglyceridemia: Secondary | ICD-10-CM | POA: Diagnosis not present

## 2021-04-27 DIAGNOSIS — E1122 Type 2 diabetes mellitus with diabetic chronic kidney disease: Secondary | ICD-10-CM | POA: Diagnosis not present

## 2021-04-28 DIAGNOSIS — I480 Paroxysmal atrial fibrillation: Secondary | ICD-10-CM | POA: Diagnosis not present

## 2021-04-28 DIAGNOSIS — N1832 Chronic kidney disease, stage 3b: Secondary | ICD-10-CM | POA: Diagnosis not present

## 2021-04-28 DIAGNOSIS — I129 Hypertensive chronic kidney disease with stage 1 through stage 4 chronic kidney disease, or unspecified chronic kidney disease: Secondary | ICD-10-CM | POA: Diagnosis not present

## 2021-04-28 DIAGNOSIS — G9341 Metabolic encephalopathy: Secondary | ICD-10-CM | POA: Diagnosis not present

## 2021-04-28 DIAGNOSIS — E1122 Type 2 diabetes mellitus with diabetic chronic kidney disease: Secondary | ICD-10-CM | POA: Diagnosis not present

## 2021-04-28 DIAGNOSIS — Z794 Long term (current) use of insulin: Secondary | ICD-10-CM | POA: Diagnosis not present

## 2021-04-28 DIAGNOSIS — R531 Weakness: Secondary | ICD-10-CM | POA: Diagnosis not present

## 2021-05-02 DIAGNOSIS — Z794 Long term (current) use of insulin: Secondary | ICD-10-CM | POA: Diagnosis not present

## 2021-05-02 DIAGNOSIS — N1832 Chronic kidney disease, stage 3b: Secondary | ICD-10-CM | POA: Diagnosis not present

## 2021-05-02 DIAGNOSIS — I129 Hypertensive chronic kidney disease with stage 1 through stage 4 chronic kidney disease, or unspecified chronic kidney disease: Secondary | ICD-10-CM | POA: Diagnosis not present

## 2021-05-02 DIAGNOSIS — G9341 Metabolic encephalopathy: Secondary | ICD-10-CM | POA: Diagnosis not present

## 2021-05-02 DIAGNOSIS — R531 Weakness: Secondary | ICD-10-CM | POA: Diagnosis not present

## 2021-05-02 DIAGNOSIS — I480 Paroxysmal atrial fibrillation: Secondary | ICD-10-CM | POA: Diagnosis not present

## 2021-05-02 DIAGNOSIS — E1122 Type 2 diabetes mellitus with diabetic chronic kidney disease: Secondary | ICD-10-CM | POA: Diagnosis not present

## 2021-05-03 DIAGNOSIS — G9341 Metabolic encephalopathy: Secondary | ICD-10-CM | POA: Diagnosis not present

## 2021-05-03 DIAGNOSIS — R531 Weakness: Secondary | ICD-10-CM | POA: Diagnosis not present

## 2021-05-03 DIAGNOSIS — I480 Paroxysmal atrial fibrillation: Secondary | ICD-10-CM | POA: Diagnosis not present

## 2021-05-03 DIAGNOSIS — Z794 Long term (current) use of insulin: Secondary | ICD-10-CM | POA: Diagnosis not present

## 2021-05-03 DIAGNOSIS — I129 Hypertensive chronic kidney disease with stage 1 through stage 4 chronic kidney disease, or unspecified chronic kidney disease: Secondary | ICD-10-CM | POA: Diagnosis not present

## 2021-05-03 DIAGNOSIS — N1832 Chronic kidney disease, stage 3b: Secondary | ICD-10-CM | POA: Diagnosis not present

## 2021-05-03 DIAGNOSIS — E1122 Type 2 diabetes mellitus with diabetic chronic kidney disease: Secondary | ICD-10-CM | POA: Diagnosis not present

## 2021-05-04 DIAGNOSIS — G9341 Metabolic encephalopathy: Secondary | ICD-10-CM | POA: Diagnosis not present

## 2021-05-04 DIAGNOSIS — I129 Hypertensive chronic kidney disease with stage 1 through stage 4 chronic kidney disease, or unspecified chronic kidney disease: Secondary | ICD-10-CM | POA: Diagnosis not present

## 2021-05-04 DIAGNOSIS — E1122 Type 2 diabetes mellitus with diabetic chronic kidney disease: Secondary | ICD-10-CM | POA: Diagnosis not present

## 2021-05-04 DIAGNOSIS — R531 Weakness: Secondary | ICD-10-CM | POA: Diagnosis not present

## 2021-05-04 DIAGNOSIS — Z794 Long term (current) use of insulin: Secondary | ICD-10-CM | POA: Diagnosis not present

## 2021-05-04 DIAGNOSIS — I480 Paroxysmal atrial fibrillation: Secondary | ICD-10-CM | POA: Diagnosis not present

## 2021-05-04 DIAGNOSIS — N1832 Chronic kidney disease, stage 3b: Secondary | ICD-10-CM | POA: Diagnosis not present

## 2021-05-05 DIAGNOSIS — E1122 Type 2 diabetes mellitus with diabetic chronic kidney disease: Secondary | ICD-10-CM | POA: Diagnosis not present

## 2021-05-05 DIAGNOSIS — R531 Weakness: Secondary | ICD-10-CM | POA: Diagnosis not present

## 2021-05-05 DIAGNOSIS — I129 Hypertensive chronic kidney disease with stage 1 through stage 4 chronic kidney disease, or unspecified chronic kidney disease: Secondary | ICD-10-CM | POA: Diagnosis not present

## 2021-05-05 DIAGNOSIS — G9341 Metabolic encephalopathy: Secondary | ICD-10-CM | POA: Diagnosis not present

## 2021-05-05 DIAGNOSIS — N1832 Chronic kidney disease, stage 3b: Secondary | ICD-10-CM | POA: Diagnosis not present

## 2021-05-05 DIAGNOSIS — I480 Paroxysmal atrial fibrillation: Secondary | ICD-10-CM | POA: Diagnosis not present

## 2021-05-05 DIAGNOSIS — Z794 Long term (current) use of insulin: Secondary | ICD-10-CM | POA: Diagnosis not present

## 2021-05-09 DIAGNOSIS — I480 Paroxysmal atrial fibrillation: Secondary | ICD-10-CM | POA: Diagnosis not present

## 2021-05-09 DIAGNOSIS — I129 Hypertensive chronic kidney disease with stage 1 through stage 4 chronic kidney disease, or unspecified chronic kidney disease: Secondary | ICD-10-CM | POA: Diagnosis not present

## 2021-05-09 DIAGNOSIS — G9341 Metabolic encephalopathy: Secondary | ICD-10-CM | POA: Diagnosis not present

## 2021-05-09 DIAGNOSIS — R531 Weakness: Secondary | ICD-10-CM | POA: Diagnosis not present

## 2021-05-09 DIAGNOSIS — Z794 Long term (current) use of insulin: Secondary | ICD-10-CM | POA: Diagnosis not present

## 2021-05-09 DIAGNOSIS — E1122 Type 2 diabetes mellitus with diabetic chronic kidney disease: Secondary | ICD-10-CM | POA: Diagnosis not present

## 2021-05-09 DIAGNOSIS — N1832 Chronic kidney disease, stage 3b: Secondary | ICD-10-CM | POA: Diagnosis not present

## 2021-05-20 DIAGNOSIS — R531 Weakness: Secondary | ICD-10-CM | POA: Diagnosis not present

## 2021-05-20 DIAGNOSIS — E1122 Type 2 diabetes mellitus with diabetic chronic kidney disease: Secondary | ICD-10-CM | POA: Diagnosis not present

## 2021-05-20 DIAGNOSIS — N1832 Chronic kidney disease, stage 3b: Secondary | ICD-10-CM | POA: Diagnosis not present

## 2021-05-20 DIAGNOSIS — G9341 Metabolic encephalopathy: Secondary | ICD-10-CM | POA: Diagnosis not present

## 2021-05-20 DIAGNOSIS — I480 Paroxysmal atrial fibrillation: Secondary | ICD-10-CM | POA: Diagnosis not present

## 2021-05-20 DIAGNOSIS — I129 Hypertensive chronic kidney disease with stage 1 through stage 4 chronic kidney disease, or unspecified chronic kidney disease: Secondary | ICD-10-CM | POA: Diagnosis not present

## 2021-05-20 DIAGNOSIS — Z794 Long term (current) use of insulin: Secondary | ICD-10-CM | POA: Diagnosis not present

## 2021-05-25 DIAGNOSIS — G9341 Metabolic encephalopathy: Secondary | ICD-10-CM | POA: Diagnosis not present

## 2021-05-25 DIAGNOSIS — N1832 Chronic kidney disease, stage 3b: Secondary | ICD-10-CM | POA: Diagnosis not present

## 2021-05-25 DIAGNOSIS — I480 Paroxysmal atrial fibrillation: Secondary | ICD-10-CM | POA: Diagnosis not present

## 2021-05-25 DIAGNOSIS — R531 Weakness: Secondary | ICD-10-CM | POA: Diagnosis not present

## 2021-05-25 DIAGNOSIS — N1831 Chronic kidney disease, stage 3a: Secondary | ICD-10-CM | POA: Diagnosis not present

## 2021-05-25 DIAGNOSIS — G8929 Other chronic pain: Secondary | ICD-10-CM | POA: Diagnosis not present

## 2021-05-25 DIAGNOSIS — I10 Essential (primary) hypertension: Secondary | ICD-10-CM | POA: Diagnosis not present

## 2021-05-25 DIAGNOSIS — E1122 Type 2 diabetes mellitus with diabetic chronic kidney disease: Secondary | ICD-10-CM | POA: Diagnosis not present

## 2021-05-25 DIAGNOSIS — E781 Pure hyperglyceridemia: Secondary | ICD-10-CM | POA: Diagnosis not present

## 2021-05-25 DIAGNOSIS — I129 Hypertensive chronic kidney disease with stage 1 through stage 4 chronic kidney disease, or unspecified chronic kidney disease: Secondary | ICD-10-CM | POA: Diagnosis not present

## 2021-05-25 DIAGNOSIS — Z794 Long term (current) use of insulin: Secondary | ICD-10-CM | POA: Diagnosis not present

## 2021-05-30 DIAGNOSIS — H6123 Impacted cerumen, bilateral: Secondary | ICD-10-CM | POA: Diagnosis not present

## 2021-06-01 DIAGNOSIS — G9341 Metabolic encephalopathy: Secondary | ICD-10-CM | POA: Diagnosis not present

## 2021-06-01 DIAGNOSIS — I480 Paroxysmal atrial fibrillation: Secondary | ICD-10-CM | POA: Diagnosis not present

## 2021-06-01 DIAGNOSIS — R531 Weakness: Secondary | ICD-10-CM | POA: Diagnosis not present

## 2021-06-01 DIAGNOSIS — I129 Hypertensive chronic kidney disease with stage 1 through stage 4 chronic kidney disease, or unspecified chronic kidney disease: Secondary | ICD-10-CM | POA: Diagnosis not present

## 2021-06-01 DIAGNOSIS — Z794 Long term (current) use of insulin: Secondary | ICD-10-CM | POA: Diagnosis not present

## 2021-06-01 DIAGNOSIS — N1832 Chronic kidney disease, stage 3b: Secondary | ICD-10-CM | POA: Diagnosis not present

## 2021-06-01 DIAGNOSIS — E1122 Type 2 diabetes mellitus with diabetic chronic kidney disease: Secondary | ICD-10-CM | POA: Diagnosis not present

## 2021-06-08 DIAGNOSIS — E1122 Type 2 diabetes mellitus with diabetic chronic kidney disease: Secondary | ICD-10-CM | POA: Diagnosis not present

## 2021-06-08 DIAGNOSIS — I129 Hypertensive chronic kidney disease with stage 1 through stage 4 chronic kidney disease, or unspecified chronic kidney disease: Secondary | ICD-10-CM | POA: Diagnosis not present

## 2021-06-08 DIAGNOSIS — R531 Weakness: Secondary | ICD-10-CM | POA: Diagnosis not present

## 2021-06-08 DIAGNOSIS — Z794 Long term (current) use of insulin: Secondary | ICD-10-CM | POA: Diagnosis not present

## 2021-06-08 DIAGNOSIS — N1832 Chronic kidney disease, stage 3b: Secondary | ICD-10-CM | POA: Diagnosis not present

## 2021-06-08 DIAGNOSIS — I480 Paroxysmal atrial fibrillation: Secondary | ICD-10-CM | POA: Diagnosis not present

## 2021-06-08 DIAGNOSIS — G9341 Metabolic encephalopathy: Secondary | ICD-10-CM | POA: Diagnosis not present

## 2021-06-14 DIAGNOSIS — I480 Paroxysmal atrial fibrillation: Secondary | ICD-10-CM | POA: Diagnosis not present

## 2021-06-14 DIAGNOSIS — I129 Hypertensive chronic kidney disease with stage 1 through stage 4 chronic kidney disease, or unspecified chronic kidney disease: Secondary | ICD-10-CM | POA: Diagnosis not present

## 2021-06-14 DIAGNOSIS — Z794 Long term (current) use of insulin: Secondary | ICD-10-CM | POA: Diagnosis not present

## 2021-06-14 DIAGNOSIS — N1832 Chronic kidney disease, stage 3b: Secondary | ICD-10-CM | POA: Diagnosis not present

## 2021-06-14 DIAGNOSIS — R531 Weakness: Secondary | ICD-10-CM | POA: Diagnosis not present

## 2021-06-14 DIAGNOSIS — E1122 Type 2 diabetes mellitus with diabetic chronic kidney disease: Secondary | ICD-10-CM | POA: Diagnosis not present

## 2021-06-14 DIAGNOSIS — G9341 Metabolic encephalopathy: Secondary | ICD-10-CM | POA: Diagnosis not present

## 2021-06-16 DIAGNOSIS — Z794 Long term (current) use of insulin: Secondary | ICD-10-CM | POA: Diagnosis not present

## 2021-06-16 DIAGNOSIS — E1122 Type 2 diabetes mellitus with diabetic chronic kidney disease: Secondary | ICD-10-CM | POA: Diagnosis not present

## 2021-06-16 DIAGNOSIS — N1832 Chronic kidney disease, stage 3b: Secondary | ICD-10-CM | POA: Diagnosis not present

## 2021-06-16 DIAGNOSIS — R531 Weakness: Secondary | ICD-10-CM | POA: Diagnosis not present

## 2021-06-16 DIAGNOSIS — G9341 Metabolic encephalopathy: Secondary | ICD-10-CM | POA: Diagnosis not present

## 2021-06-16 DIAGNOSIS — I480 Paroxysmal atrial fibrillation: Secondary | ICD-10-CM | POA: Diagnosis not present

## 2021-06-16 DIAGNOSIS — I129 Hypertensive chronic kidney disease with stage 1 through stage 4 chronic kidney disease, or unspecified chronic kidney disease: Secondary | ICD-10-CM | POA: Diagnosis not present

## 2021-06-21 DIAGNOSIS — G9341 Metabolic encephalopathy: Secondary | ICD-10-CM | POA: Diagnosis not present

## 2021-06-21 DIAGNOSIS — I129 Hypertensive chronic kidney disease with stage 1 through stage 4 chronic kidney disease, or unspecified chronic kidney disease: Secondary | ICD-10-CM | POA: Diagnosis not present

## 2021-06-21 DIAGNOSIS — Z794 Long term (current) use of insulin: Secondary | ICD-10-CM | POA: Diagnosis not present

## 2021-06-21 DIAGNOSIS — E1122 Type 2 diabetes mellitus with diabetic chronic kidney disease: Secondary | ICD-10-CM | POA: Diagnosis not present

## 2021-06-21 DIAGNOSIS — I480 Paroxysmal atrial fibrillation: Secondary | ICD-10-CM | POA: Diagnosis not present

## 2021-06-21 DIAGNOSIS — N1832 Chronic kidney disease, stage 3b: Secondary | ICD-10-CM | POA: Diagnosis not present

## 2021-06-21 DIAGNOSIS — R531 Weakness: Secondary | ICD-10-CM | POA: Diagnosis not present

## 2021-06-24 DIAGNOSIS — I480 Paroxysmal atrial fibrillation: Secondary | ICD-10-CM | POA: Diagnosis not present

## 2021-06-24 DIAGNOSIS — I129 Hypertensive chronic kidney disease with stage 1 through stage 4 chronic kidney disease, or unspecified chronic kidney disease: Secondary | ICD-10-CM | POA: Diagnosis not present

## 2021-06-24 DIAGNOSIS — Z794 Long term (current) use of insulin: Secondary | ICD-10-CM | POA: Diagnosis not present

## 2021-06-24 DIAGNOSIS — H905 Unspecified sensorineural hearing loss: Secondary | ICD-10-CM | POA: Diagnosis not present

## 2021-06-24 DIAGNOSIS — G9341 Metabolic encephalopathy: Secondary | ICD-10-CM | POA: Diagnosis not present

## 2021-06-24 DIAGNOSIS — N1832 Chronic kidney disease, stage 3b: Secondary | ICD-10-CM | POA: Diagnosis not present

## 2021-06-24 DIAGNOSIS — R531 Weakness: Secondary | ICD-10-CM | POA: Diagnosis not present

## 2021-06-24 DIAGNOSIS — E1122 Type 2 diabetes mellitus with diabetic chronic kidney disease: Secondary | ICD-10-CM | POA: Diagnosis not present

## 2021-06-28 DIAGNOSIS — I129 Hypertensive chronic kidney disease with stage 1 through stage 4 chronic kidney disease, or unspecified chronic kidney disease: Secondary | ICD-10-CM | POA: Diagnosis not present

## 2021-06-28 DIAGNOSIS — Z794 Long term (current) use of insulin: Secondary | ICD-10-CM | POA: Diagnosis not present

## 2021-06-28 DIAGNOSIS — G9341 Metabolic encephalopathy: Secondary | ICD-10-CM | POA: Diagnosis not present

## 2021-06-28 DIAGNOSIS — E1122 Type 2 diabetes mellitus with diabetic chronic kidney disease: Secondary | ICD-10-CM | POA: Diagnosis not present

## 2021-06-28 DIAGNOSIS — N1832 Chronic kidney disease, stage 3b: Secondary | ICD-10-CM | POA: Diagnosis not present

## 2021-06-28 DIAGNOSIS — I480 Paroxysmal atrial fibrillation: Secondary | ICD-10-CM | POA: Diagnosis not present

## 2021-06-28 DIAGNOSIS — R531 Weakness: Secondary | ICD-10-CM | POA: Diagnosis not present

## 2021-06-29 DIAGNOSIS — E1122 Type 2 diabetes mellitus with diabetic chronic kidney disease: Secondary | ICD-10-CM | POA: Diagnosis not present

## 2021-06-29 DIAGNOSIS — R531 Weakness: Secondary | ICD-10-CM | POA: Diagnosis not present

## 2021-06-29 DIAGNOSIS — Z794 Long term (current) use of insulin: Secondary | ICD-10-CM | POA: Diagnosis not present

## 2021-06-29 DIAGNOSIS — N1832 Chronic kidney disease, stage 3b: Secondary | ICD-10-CM | POA: Diagnosis not present

## 2021-06-29 DIAGNOSIS — I480 Paroxysmal atrial fibrillation: Secondary | ICD-10-CM | POA: Diagnosis not present

## 2021-06-29 DIAGNOSIS — G9341 Metabolic encephalopathy: Secondary | ICD-10-CM | POA: Diagnosis not present

## 2021-06-29 DIAGNOSIS — I129 Hypertensive chronic kidney disease with stage 1 through stage 4 chronic kidney disease, or unspecified chronic kidney disease: Secondary | ICD-10-CM | POA: Diagnosis not present

## 2021-06-30 DIAGNOSIS — G9341 Metabolic encephalopathy: Secondary | ICD-10-CM | POA: Diagnosis not present

## 2021-06-30 DIAGNOSIS — R531 Weakness: Secondary | ICD-10-CM | POA: Diagnosis not present

## 2021-06-30 DIAGNOSIS — N1832 Chronic kidney disease, stage 3b: Secondary | ICD-10-CM | POA: Diagnosis not present

## 2021-06-30 DIAGNOSIS — Z794 Long term (current) use of insulin: Secondary | ICD-10-CM | POA: Diagnosis not present

## 2021-06-30 DIAGNOSIS — I129 Hypertensive chronic kidney disease with stage 1 through stage 4 chronic kidney disease, or unspecified chronic kidney disease: Secondary | ICD-10-CM | POA: Diagnosis not present

## 2021-06-30 DIAGNOSIS — I480 Paroxysmal atrial fibrillation: Secondary | ICD-10-CM | POA: Diagnosis not present

## 2021-06-30 DIAGNOSIS — E1122 Type 2 diabetes mellitus with diabetic chronic kidney disease: Secondary | ICD-10-CM | POA: Diagnosis not present

## 2021-07-04 DIAGNOSIS — I129 Hypertensive chronic kidney disease with stage 1 through stage 4 chronic kidney disease, or unspecified chronic kidney disease: Secondary | ICD-10-CM | POA: Diagnosis not present

## 2021-07-04 DIAGNOSIS — Z794 Long term (current) use of insulin: Secondary | ICD-10-CM | POA: Diagnosis not present

## 2021-07-04 DIAGNOSIS — E1122 Type 2 diabetes mellitus with diabetic chronic kidney disease: Secondary | ICD-10-CM | POA: Diagnosis not present

## 2021-07-04 DIAGNOSIS — R531 Weakness: Secondary | ICD-10-CM | POA: Diagnosis not present

## 2021-07-04 DIAGNOSIS — I480 Paroxysmal atrial fibrillation: Secondary | ICD-10-CM | POA: Diagnosis not present

## 2021-07-04 DIAGNOSIS — N1832 Chronic kidney disease, stage 3b: Secondary | ICD-10-CM | POA: Diagnosis not present

## 2021-07-04 DIAGNOSIS — G9341 Metabolic encephalopathy: Secondary | ICD-10-CM | POA: Diagnosis not present

## 2021-07-05 DIAGNOSIS — I129 Hypertensive chronic kidney disease with stage 1 through stage 4 chronic kidney disease, or unspecified chronic kidney disease: Secondary | ICD-10-CM | POA: Diagnosis not present

## 2021-07-05 DIAGNOSIS — G9341 Metabolic encephalopathy: Secondary | ICD-10-CM | POA: Diagnosis not present

## 2021-07-05 DIAGNOSIS — N1832 Chronic kidney disease, stage 3b: Secondary | ICD-10-CM | POA: Diagnosis not present

## 2021-07-05 DIAGNOSIS — Z794 Long term (current) use of insulin: Secondary | ICD-10-CM | POA: Diagnosis not present

## 2021-07-05 DIAGNOSIS — I480 Paroxysmal atrial fibrillation: Secondary | ICD-10-CM | POA: Diagnosis not present

## 2021-07-05 DIAGNOSIS — R531 Weakness: Secondary | ICD-10-CM | POA: Diagnosis not present

## 2021-07-05 DIAGNOSIS — E1122 Type 2 diabetes mellitus with diabetic chronic kidney disease: Secondary | ICD-10-CM | POA: Diagnosis not present

## 2021-07-08 DIAGNOSIS — S81801A Unspecified open wound, right lower leg, initial encounter: Secondary | ICD-10-CM | POA: Diagnosis not present

## 2021-07-13 DIAGNOSIS — I10 Essential (primary) hypertension: Secondary | ICD-10-CM | POA: Diagnosis not present

## 2021-07-13 DIAGNOSIS — E1122 Type 2 diabetes mellitus with diabetic chronic kidney disease: Secondary | ICD-10-CM | POA: Diagnosis not present

## 2021-07-13 DIAGNOSIS — Z7984 Long term (current) use of oral hypoglycemic drugs: Secondary | ICD-10-CM | POA: Diagnosis not present

## 2021-07-13 DIAGNOSIS — Z7985 Long-term (current) use of injectable non-insulin antidiabetic drugs: Secondary | ICD-10-CM | POA: Diagnosis not present

## 2021-07-13 DIAGNOSIS — E1140 Type 2 diabetes mellitus with diabetic neuropathy, unspecified: Secondary | ICD-10-CM | POA: Diagnosis not present

## 2021-07-13 DIAGNOSIS — N1832 Chronic kidney disease, stage 3b: Secondary | ICD-10-CM | POA: Diagnosis not present

## 2021-07-13 DIAGNOSIS — I129 Hypertensive chronic kidney disease with stage 1 through stage 4 chronic kidney disease, or unspecified chronic kidney disease: Secondary | ICD-10-CM | POA: Diagnosis not present

## 2021-07-13 DIAGNOSIS — Z7901 Long term (current) use of anticoagulants: Secondary | ICD-10-CM | POA: Diagnosis not present

## 2021-07-13 DIAGNOSIS — I480 Paroxysmal atrial fibrillation: Secondary | ICD-10-CM | POA: Diagnosis not present

## 2021-07-13 DIAGNOSIS — R531 Weakness: Secondary | ICD-10-CM | POA: Diagnosis not present

## 2021-07-13 DIAGNOSIS — S81801D Unspecified open wound, right lower leg, subsequent encounter: Secondary | ICD-10-CM | POA: Diagnosis not present

## 2021-07-14 DIAGNOSIS — S81801D Unspecified open wound, right lower leg, subsequent encounter: Secondary | ICD-10-CM | POA: Diagnosis not present

## 2021-07-14 DIAGNOSIS — E1140 Type 2 diabetes mellitus with diabetic neuropathy, unspecified: Secondary | ICD-10-CM | POA: Diagnosis not present

## 2021-07-14 DIAGNOSIS — Z7985 Long-term (current) use of injectable non-insulin antidiabetic drugs: Secondary | ICD-10-CM | POA: Diagnosis not present

## 2021-07-14 DIAGNOSIS — E1122 Type 2 diabetes mellitus with diabetic chronic kidney disease: Secondary | ICD-10-CM | POA: Diagnosis not present

## 2021-07-14 DIAGNOSIS — I129 Hypertensive chronic kidney disease with stage 1 through stage 4 chronic kidney disease, or unspecified chronic kidney disease: Secondary | ICD-10-CM | POA: Diagnosis not present

## 2021-07-14 DIAGNOSIS — I480 Paroxysmal atrial fibrillation: Secondary | ICD-10-CM | POA: Diagnosis not present

## 2021-07-14 DIAGNOSIS — Z7984 Long term (current) use of oral hypoglycemic drugs: Secondary | ICD-10-CM | POA: Diagnosis not present

## 2021-07-14 DIAGNOSIS — Z7901 Long term (current) use of anticoagulants: Secondary | ICD-10-CM | POA: Diagnosis not present

## 2021-07-14 DIAGNOSIS — R531 Weakness: Secondary | ICD-10-CM | POA: Diagnosis not present

## 2021-07-14 DIAGNOSIS — N1832 Chronic kidney disease, stage 3b: Secondary | ICD-10-CM | POA: Diagnosis not present

## 2021-07-19 DIAGNOSIS — R531 Weakness: Secondary | ICD-10-CM | POA: Diagnosis not present

## 2021-07-19 DIAGNOSIS — Z7985 Long-term (current) use of injectable non-insulin antidiabetic drugs: Secondary | ICD-10-CM | POA: Diagnosis not present

## 2021-07-19 DIAGNOSIS — Z7901 Long term (current) use of anticoagulants: Secondary | ICD-10-CM | POA: Diagnosis not present

## 2021-07-19 DIAGNOSIS — I129 Hypertensive chronic kidney disease with stage 1 through stage 4 chronic kidney disease, or unspecified chronic kidney disease: Secondary | ICD-10-CM | POA: Diagnosis not present

## 2021-07-19 DIAGNOSIS — I480 Paroxysmal atrial fibrillation: Secondary | ICD-10-CM | POA: Diagnosis not present

## 2021-07-19 DIAGNOSIS — E1140 Type 2 diabetes mellitus with diabetic neuropathy, unspecified: Secondary | ICD-10-CM | POA: Diagnosis not present

## 2021-07-19 DIAGNOSIS — S81801D Unspecified open wound, right lower leg, subsequent encounter: Secondary | ICD-10-CM | POA: Diagnosis not present

## 2021-07-19 DIAGNOSIS — E1122 Type 2 diabetes mellitus with diabetic chronic kidney disease: Secondary | ICD-10-CM | POA: Diagnosis not present

## 2021-07-19 DIAGNOSIS — Z7984 Long term (current) use of oral hypoglycemic drugs: Secondary | ICD-10-CM | POA: Diagnosis not present

## 2021-07-19 DIAGNOSIS — N1832 Chronic kidney disease, stage 3b: Secondary | ICD-10-CM | POA: Diagnosis not present

## 2021-07-20 DIAGNOSIS — Z7985 Long-term (current) use of injectable non-insulin antidiabetic drugs: Secondary | ICD-10-CM | POA: Diagnosis not present

## 2021-07-20 DIAGNOSIS — E1140 Type 2 diabetes mellitus with diabetic neuropathy, unspecified: Secondary | ICD-10-CM | POA: Diagnosis not present

## 2021-07-20 DIAGNOSIS — Z7901 Long term (current) use of anticoagulants: Secondary | ICD-10-CM | POA: Diagnosis not present

## 2021-07-20 DIAGNOSIS — I129 Hypertensive chronic kidney disease with stage 1 through stage 4 chronic kidney disease, or unspecified chronic kidney disease: Secondary | ICD-10-CM | POA: Diagnosis not present

## 2021-07-20 DIAGNOSIS — R531 Weakness: Secondary | ICD-10-CM | POA: Diagnosis not present

## 2021-07-20 DIAGNOSIS — E1122 Type 2 diabetes mellitus with diabetic chronic kidney disease: Secondary | ICD-10-CM | POA: Diagnosis not present

## 2021-07-20 DIAGNOSIS — S81801D Unspecified open wound, right lower leg, subsequent encounter: Secondary | ICD-10-CM | POA: Diagnosis not present

## 2021-07-20 DIAGNOSIS — N1832 Chronic kidney disease, stage 3b: Secondary | ICD-10-CM | POA: Diagnosis not present

## 2021-07-20 DIAGNOSIS — I480 Paroxysmal atrial fibrillation: Secondary | ICD-10-CM | POA: Diagnosis not present

## 2021-07-20 DIAGNOSIS — Z7984 Long term (current) use of oral hypoglycemic drugs: Secondary | ICD-10-CM | POA: Diagnosis not present

## 2021-07-26 DIAGNOSIS — Z7901 Long term (current) use of anticoagulants: Secondary | ICD-10-CM | POA: Diagnosis not present

## 2021-07-26 DIAGNOSIS — Z7985 Long-term (current) use of injectable non-insulin antidiabetic drugs: Secondary | ICD-10-CM | POA: Diagnosis not present

## 2021-07-26 DIAGNOSIS — S81801D Unspecified open wound, right lower leg, subsequent encounter: Secondary | ICD-10-CM | POA: Diagnosis not present

## 2021-07-26 DIAGNOSIS — R531 Weakness: Secondary | ICD-10-CM | POA: Diagnosis not present

## 2021-07-26 DIAGNOSIS — Z7984 Long term (current) use of oral hypoglycemic drugs: Secondary | ICD-10-CM | POA: Diagnosis not present

## 2021-07-26 DIAGNOSIS — I480 Paroxysmal atrial fibrillation: Secondary | ICD-10-CM | POA: Diagnosis not present

## 2021-07-26 DIAGNOSIS — E1122 Type 2 diabetes mellitus with diabetic chronic kidney disease: Secondary | ICD-10-CM | POA: Diagnosis not present

## 2021-07-26 DIAGNOSIS — E1140 Type 2 diabetes mellitus with diabetic neuropathy, unspecified: Secondary | ICD-10-CM | POA: Diagnosis not present

## 2021-07-26 DIAGNOSIS — I129 Hypertensive chronic kidney disease with stage 1 through stage 4 chronic kidney disease, or unspecified chronic kidney disease: Secondary | ICD-10-CM | POA: Diagnosis not present

## 2021-07-26 DIAGNOSIS — N1832 Chronic kidney disease, stage 3b: Secondary | ICD-10-CM | POA: Diagnosis not present

## 2021-07-27 DIAGNOSIS — Z7901 Long term (current) use of anticoagulants: Secondary | ICD-10-CM | POA: Diagnosis not present

## 2021-07-27 DIAGNOSIS — I129 Hypertensive chronic kidney disease with stage 1 through stage 4 chronic kidney disease, or unspecified chronic kidney disease: Secondary | ICD-10-CM | POA: Diagnosis not present

## 2021-07-27 DIAGNOSIS — R531 Weakness: Secondary | ICD-10-CM | POA: Diagnosis not present

## 2021-07-27 DIAGNOSIS — Z7985 Long-term (current) use of injectable non-insulin antidiabetic drugs: Secondary | ICD-10-CM | POA: Diagnosis not present

## 2021-07-27 DIAGNOSIS — S81801D Unspecified open wound, right lower leg, subsequent encounter: Secondary | ICD-10-CM | POA: Diagnosis not present

## 2021-07-27 DIAGNOSIS — E1140 Type 2 diabetes mellitus with diabetic neuropathy, unspecified: Secondary | ICD-10-CM | POA: Diagnosis not present

## 2021-07-27 DIAGNOSIS — E1122 Type 2 diabetes mellitus with diabetic chronic kidney disease: Secondary | ICD-10-CM | POA: Diagnosis not present

## 2021-07-27 DIAGNOSIS — N1832 Chronic kidney disease, stage 3b: Secondary | ICD-10-CM | POA: Diagnosis not present

## 2021-07-27 DIAGNOSIS — Z7984 Long term (current) use of oral hypoglycemic drugs: Secondary | ICD-10-CM | POA: Diagnosis not present

## 2021-07-27 DIAGNOSIS — I480 Paroxysmal atrial fibrillation: Secondary | ICD-10-CM | POA: Diagnosis not present

## 2021-07-29 DIAGNOSIS — I480 Paroxysmal atrial fibrillation: Secondary | ICD-10-CM | POA: Diagnosis not present

## 2021-07-29 DIAGNOSIS — S81801D Unspecified open wound, right lower leg, subsequent encounter: Secondary | ICD-10-CM | POA: Diagnosis not present

## 2021-07-29 DIAGNOSIS — E1140 Type 2 diabetes mellitus with diabetic neuropathy, unspecified: Secondary | ICD-10-CM | POA: Diagnosis not present

## 2021-07-29 DIAGNOSIS — Z7984 Long term (current) use of oral hypoglycemic drugs: Secondary | ICD-10-CM | POA: Diagnosis not present

## 2021-07-29 DIAGNOSIS — Z7985 Long-term (current) use of injectable non-insulin antidiabetic drugs: Secondary | ICD-10-CM | POA: Diagnosis not present

## 2021-07-29 DIAGNOSIS — E1122 Type 2 diabetes mellitus with diabetic chronic kidney disease: Secondary | ICD-10-CM | POA: Diagnosis not present

## 2021-07-29 DIAGNOSIS — Z7901 Long term (current) use of anticoagulants: Secondary | ICD-10-CM | POA: Diagnosis not present

## 2021-07-29 DIAGNOSIS — I129 Hypertensive chronic kidney disease with stage 1 through stage 4 chronic kidney disease, or unspecified chronic kidney disease: Secondary | ICD-10-CM | POA: Diagnosis not present

## 2021-07-29 DIAGNOSIS — N1832 Chronic kidney disease, stage 3b: Secondary | ICD-10-CM | POA: Diagnosis not present

## 2021-07-29 DIAGNOSIS — R531 Weakness: Secondary | ICD-10-CM | POA: Diagnosis not present

## 2021-08-03 DIAGNOSIS — E1122 Type 2 diabetes mellitus with diabetic chronic kidney disease: Secondary | ICD-10-CM | POA: Diagnosis not present

## 2021-08-03 DIAGNOSIS — Z7985 Long-term (current) use of injectable non-insulin antidiabetic drugs: Secondary | ICD-10-CM | POA: Diagnosis not present

## 2021-08-03 DIAGNOSIS — Z7984 Long term (current) use of oral hypoglycemic drugs: Secondary | ICD-10-CM | POA: Diagnosis not present

## 2021-08-03 DIAGNOSIS — E1140 Type 2 diabetes mellitus with diabetic neuropathy, unspecified: Secondary | ICD-10-CM | POA: Diagnosis not present

## 2021-08-03 DIAGNOSIS — I480 Paroxysmal atrial fibrillation: Secondary | ICD-10-CM | POA: Diagnosis not present

## 2021-08-03 DIAGNOSIS — I129 Hypertensive chronic kidney disease with stage 1 through stage 4 chronic kidney disease, or unspecified chronic kidney disease: Secondary | ICD-10-CM | POA: Diagnosis not present

## 2021-08-03 DIAGNOSIS — N1832 Chronic kidney disease, stage 3b: Secondary | ICD-10-CM | POA: Diagnosis not present

## 2021-08-03 DIAGNOSIS — R531 Weakness: Secondary | ICD-10-CM | POA: Diagnosis not present

## 2021-08-03 DIAGNOSIS — S81801D Unspecified open wound, right lower leg, subsequent encounter: Secondary | ICD-10-CM | POA: Diagnosis not present

## 2021-08-03 DIAGNOSIS — Z7901 Long term (current) use of anticoagulants: Secondary | ICD-10-CM | POA: Diagnosis not present

## 2021-08-05 DIAGNOSIS — S81801D Unspecified open wound, right lower leg, subsequent encounter: Secondary | ICD-10-CM | POA: Diagnosis not present

## 2021-08-05 DIAGNOSIS — I129 Hypertensive chronic kidney disease with stage 1 through stage 4 chronic kidney disease, or unspecified chronic kidney disease: Secondary | ICD-10-CM | POA: Diagnosis not present

## 2021-08-05 DIAGNOSIS — E1140 Type 2 diabetes mellitus with diabetic neuropathy, unspecified: Secondary | ICD-10-CM | POA: Diagnosis not present

## 2021-08-05 DIAGNOSIS — I480 Paroxysmal atrial fibrillation: Secondary | ICD-10-CM | POA: Diagnosis not present

## 2021-08-05 DIAGNOSIS — R531 Weakness: Secondary | ICD-10-CM | POA: Diagnosis not present

## 2021-08-05 DIAGNOSIS — E1122 Type 2 diabetes mellitus with diabetic chronic kidney disease: Secondary | ICD-10-CM | POA: Diagnosis not present

## 2021-08-05 DIAGNOSIS — Z7985 Long-term (current) use of injectable non-insulin antidiabetic drugs: Secondary | ICD-10-CM | POA: Diagnosis not present

## 2021-08-05 DIAGNOSIS — N1832 Chronic kidney disease, stage 3b: Secondary | ICD-10-CM | POA: Diagnosis not present

## 2021-08-05 DIAGNOSIS — Z7901 Long term (current) use of anticoagulants: Secondary | ICD-10-CM | POA: Diagnosis not present

## 2021-08-05 DIAGNOSIS — Z7984 Long term (current) use of oral hypoglycemic drugs: Secondary | ICD-10-CM | POA: Diagnosis not present

## 2021-08-10 DIAGNOSIS — N1832 Chronic kidney disease, stage 3b: Secondary | ICD-10-CM | POA: Diagnosis not present

## 2021-08-10 DIAGNOSIS — E1122 Type 2 diabetes mellitus with diabetic chronic kidney disease: Secondary | ICD-10-CM | POA: Diagnosis not present

## 2021-08-10 DIAGNOSIS — S81801D Unspecified open wound, right lower leg, subsequent encounter: Secondary | ICD-10-CM | POA: Diagnosis not present

## 2021-08-10 DIAGNOSIS — I480 Paroxysmal atrial fibrillation: Secondary | ICD-10-CM | POA: Diagnosis not present

## 2021-08-10 DIAGNOSIS — Z7984 Long term (current) use of oral hypoglycemic drugs: Secondary | ICD-10-CM | POA: Diagnosis not present

## 2021-08-10 DIAGNOSIS — E1140 Type 2 diabetes mellitus with diabetic neuropathy, unspecified: Secondary | ICD-10-CM | POA: Diagnosis not present

## 2021-08-10 DIAGNOSIS — Z7985 Long-term (current) use of injectable non-insulin antidiabetic drugs: Secondary | ICD-10-CM | POA: Diagnosis not present

## 2021-08-10 DIAGNOSIS — I129 Hypertensive chronic kidney disease with stage 1 through stage 4 chronic kidney disease, or unspecified chronic kidney disease: Secondary | ICD-10-CM | POA: Diagnosis not present

## 2021-08-10 DIAGNOSIS — R531 Weakness: Secondary | ICD-10-CM | POA: Diagnosis not present

## 2021-08-10 DIAGNOSIS — Z7901 Long term (current) use of anticoagulants: Secondary | ICD-10-CM | POA: Diagnosis not present

## 2021-08-16 DIAGNOSIS — E1140 Type 2 diabetes mellitus with diabetic neuropathy, unspecified: Secondary | ICD-10-CM | POA: Diagnosis not present

## 2021-08-16 DIAGNOSIS — Z7901 Long term (current) use of anticoagulants: Secondary | ICD-10-CM | POA: Diagnosis not present

## 2021-08-16 DIAGNOSIS — R531 Weakness: Secondary | ICD-10-CM | POA: Diagnosis not present

## 2021-08-16 DIAGNOSIS — Z7984 Long term (current) use of oral hypoglycemic drugs: Secondary | ICD-10-CM | POA: Diagnosis not present

## 2021-08-16 DIAGNOSIS — E1122 Type 2 diabetes mellitus with diabetic chronic kidney disease: Secondary | ICD-10-CM | POA: Diagnosis not present

## 2021-08-16 DIAGNOSIS — I129 Hypertensive chronic kidney disease with stage 1 through stage 4 chronic kidney disease, or unspecified chronic kidney disease: Secondary | ICD-10-CM | POA: Diagnosis not present

## 2021-08-16 DIAGNOSIS — S81801D Unspecified open wound, right lower leg, subsequent encounter: Secondary | ICD-10-CM | POA: Diagnosis not present

## 2021-08-16 DIAGNOSIS — N1832 Chronic kidney disease, stage 3b: Secondary | ICD-10-CM | POA: Diagnosis not present

## 2021-08-16 DIAGNOSIS — Z7985 Long-term (current) use of injectable non-insulin antidiabetic drugs: Secondary | ICD-10-CM | POA: Diagnosis not present

## 2021-08-16 DIAGNOSIS — I480 Paroxysmal atrial fibrillation: Secondary | ICD-10-CM | POA: Diagnosis not present

## 2021-08-17 DIAGNOSIS — I129 Hypertensive chronic kidney disease with stage 1 through stage 4 chronic kidney disease, or unspecified chronic kidney disease: Secondary | ICD-10-CM | POA: Diagnosis not present

## 2021-08-17 DIAGNOSIS — Z7984 Long term (current) use of oral hypoglycemic drugs: Secondary | ICD-10-CM | POA: Diagnosis not present

## 2021-08-17 DIAGNOSIS — E1122 Type 2 diabetes mellitus with diabetic chronic kidney disease: Secondary | ICD-10-CM | POA: Diagnosis not present

## 2021-08-17 DIAGNOSIS — S81801D Unspecified open wound, right lower leg, subsequent encounter: Secondary | ICD-10-CM | POA: Diagnosis not present

## 2021-08-17 DIAGNOSIS — Z7985 Long-term (current) use of injectable non-insulin antidiabetic drugs: Secondary | ICD-10-CM | POA: Diagnosis not present

## 2021-08-17 DIAGNOSIS — R531 Weakness: Secondary | ICD-10-CM | POA: Diagnosis not present

## 2021-08-17 DIAGNOSIS — I480 Paroxysmal atrial fibrillation: Secondary | ICD-10-CM | POA: Diagnosis not present

## 2021-08-17 DIAGNOSIS — N1832 Chronic kidney disease, stage 3b: Secondary | ICD-10-CM | POA: Diagnosis not present

## 2021-08-17 DIAGNOSIS — Z7901 Long term (current) use of anticoagulants: Secondary | ICD-10-CM | POA: Diagnosis not present

## 2021-08-17 DIAGNOSIS — E1140 Type 2 diabetes mellitus with diabetic neuropathy, unspecified: Secondary | ICD-10-CM | POA: Diagnosis not present

## 2021-08-23 DIAGNOSIS — Z7984 Long term (current) use of oral hypoglycemic drugs: Secondary | ICD-10-CM | POA: Diagnosis not present

## 2021-08-23 DIAGNOSIS — E1122 Type 2 diabetes mellitus with diabetic chronic kidney disease: Secondary | ICD-10-CM | POA: Diagnosis not present

## 2021-08-23 DIAGNOSIS — I480 Paroxysmal atrial fibrillation: Secondary | ICD-10-CM | POA: Diagnosis not present

## 2021-08-23 DIAGNOSIS — R531 Weakness: Secondary | ICD-10-CM | POA: Diagnosis not present

## 2021-08-23 DIAGNOSIS — E1140 Type 2 diabetes mellitus with diabetic neuropathy, unspecified: Secondary | ICD-10-CM | POA: Diagnosis not present

## 2021-08-23 DIAGNOSIS — I129 Hypertensive chronic kidney disease with stage 1 through stage 4 chronic kidney disease, or unspecified chronic kidney disease: Secondary | ICD-10-CM | POA: Diagnosis not present

## 2021-08-23 DIAGNOSIS — Z7901 Long term (current) use of anticoagulants: Secondary | ICD-10-CM | POA: Diagnosis not present

## 2021-08-23 DIAGNOSIS — S81801D Unspecified open wound, right lower leg, subsequent encounter: Secondary | ICD-10-CM | POA: Diagnosis not present

## 2021-08-23 DIAGNOSIS — N1832 Chronic kidney disease, stage 3b: Secondary | ICD-10-CM | POA: Diagnosis not present

## 2021-08-23 DIAGNOSIS — Z7985 Long-term (current) use of injectable non-insulin antidiabetic drugs: Secondary | ICD-10-CM | POA: Diagnosis not present

## 2021-08-25 DIAGNOSIS — E1140 Type 2 diabetes mellitus with diabetic neuropathy, unspecified: Secondary | ICD-10-CM | POA: Diagnosis not present

## 2021-08-25 DIAGNOSIS — E1122 Type 2 diabetes mellitus with diabetic chronic kidney disease: Secondary | ICD-10-CM | POA: Diagnosis not present

## 2021-08-25 DIAGNOSIS — I480 Paroxysmal atrial fibrillation: Secondary | ICD-10-CM | POA: Diagnosis not present

## 2021-08-25 DIAGNOSIS — Z7984 Long term (current) use of oral hypoglycemic drugs: Secondary | ICD-10-CM | POA: Diagnosis not present

## 2021-08-25 DIAGNOSIS — S81801D Unspecified open wound, right lower leg, subsequent encounter: Secondary | ICD-10-CM | POA: Diagnosis not present

## 2021-08-25 DIAGNOSIS — Z7985 Long-term (current) use of injectable non-insulin antidiabetic drugs: Secondary | ICD-10-CM | POA: Diagnosis not present

## 2021-08-25 DIAGNOSIS — N1832 Chronic kidney disease, stage 3b: Secondary | ICD-10-CM | POA: Diagnosis not present

## 2021-08-25 DIAGNOSIS — R531 Weakness: Secondary | ICD-10-CM | POA: Diagnosis not present

## 2021-08-25 DIAGNOSIS — Z7901 Long term (current) use of anticoagulants: Secondary | ICD-10-CM | POA: Diagnosis not present

## 2021-08-25 DIAGNOSIS — I129 Hypertensive chronic kidney disease with stage 1 through stage 4 chronic kidney disease, or unspecified chronic kidney disease: Secondary | ICD-10-CM | POA: Diagnosis not present

## 2021-08-26 ENCOUNTER — Ambulatory Visit
Admission: RE | Admit: 2021-08-26 | Discharge: 2021-08-26 | Disposition: A | Discharge status: Home / Self Care | Payer: Medicare Other | Source: Ambulatory Visit | Attending: Physician Assistant | Admitting: Physician Assistant

## 2021-08-26 ENCOUNTER — Other Ambulatory Visit: Payer: Self-pay | Admitting: Physician Assistant

## 2021-08-26 DIAGNOSIS — L089 Local infection of the skin and subcutaneous tissue, unspecified: Secondary | ICD-10-CM

## 2021-08-26 DIAGNOSIS — M7989 Other specified soft tissue disorders: Secondary | ICD-10-CM | POA: Diagnosis not present

## 2021-08-29 ENCOUNTER — Other Ambulatory Visit: Payer: Self-pay

## 2021-08-29 ENCOUNTER — Inpatient Hospital Stay (HOSPITAL_COMMUNITY)
Admission: EM | Admit: 2021-08-29 | Discharge: 2021-08-31 | DRG: 988 | Disposition: A | Discharge status: Home / Self Care | Payer: Medicare Other | Attending: Internal Medicine | Admitting: Internal Medicine

## 2021-08-29 ENCOUNTER — Encounter (HOSPITAL_COMMUNITY): Payer: Self-pay

## 2021-08-29 DIAGNOSIS — Z7985 Long-term (current) use of injectable non-insulin antidiabetic drugs: Secondary | ICD-10-CM

## 2021-08-29 DIAGNOSIS — I872 Venous insufficiency (chronic) (peripheral): Secondary | ICD-10-CM | POA: Diagnosis not present

## 2021-08-29 DIAGNOSIS — L02511 Cutaneous abscess of right hand: Secondary | ICD-10-CM | POA: Diagnosis not present

## 2021-08-29 DIAGNOSIS — Z79899 Other long term (current) drug therapy: Secondary | ICD-10-CM

## 2021-08-29 DIAGNOSIS — E1169 Type 2 diabetes mellitus with other specified complication: Principal | ICD-10-CM | POA: Diagnosis present

## 2021-08-29 DIAGNOSIS — E1142 Type 2 diabetes mellitus with diabetic polyneuropathy: Secondary | ICD-10-CM | POA: Diagnosis present

## 2021-08-29 DIAGNOSIS — I480 Paroxysmal atrial fibrillation: Secondary | ICD-10-CM | POA: Diagnosis not present

## 2021-08-29 DIAGNOSIS — F5104 Psychophysiologic insomnia: Secondary | ICD-10-CM | POA: Diagnosis present

## 2021-08-29 DIAGNOSIS — M869 Osteomyelitis, unspecified: Secondary | ICD-10-CM | POA: Diagnosis present

## 2021-08-29 DIAGNOSIS — Z888 Allergy status to other drugs, medicaments and biological substances status: Secondary | ICD-10-CM

## 2021-08-29 DIAGNOSIS — E1165 Type 2 diabetes mellitus with hyperglycemia: Secondary | ICD-10-CM | POA: Diagnosis not present

## 2021-08-29 DIAGNOSIS — S61200A Unspecified open wound of right index finger without damage to nail, initial encounter: Secondary | ICD-10-CM | POA: Diagnosis present

## 2021-08-29 DIAGNOSIS — G8929 Other chronic pain: Secondary | ICD-10-CM | POA: Diagnosis present

## 2021-08-29 DIAGNOSIS — J309 Allergic rhinitis, unspecified: Secondary | ICD-10-CM | POA: Diagnosis not present

## 2021-08-29 DIAGNOSIS — J449 Chronic obstructive pulmonary disease, unspecified: Secondary | ICD-10-CM | POA: Diagnosis not present

## 2021-08-29 DIAGNOSIS — E1122 Type 2 diabetes mellitus with diabetic chronic kidney disease: Secondary | ICD-10-CM | POA: Diagnosis present

## 2021-08-29 DIAGNOSIS — E785 Hyperlipidemia, unspecified: Secondary | ICD-10-CM | POA: Diagnosis not present

## 2021-08-29 DIAGNOSIS — E872 Acidosis, unspecified: Secondary | ICD-10-CM | POA: Diagnosis present

## 2021-08-29 DIAGNOSIS — Z87891 Personal history of nicotine dependence: Secondary | ICD-10-CM | POA: Diagnosis not present

## 2021-08-29 DIAGNOSIS — E669 Obesity, unspecified: Secondary | ICD-10-CM | POA: Diagnosis present

## 2021-08-29 DIAGNOSIS — M86141 Other acute osteomyelitis, right hand: Secondary | ICD-10-CM | POA: Diagnosis not present

## 2021-08-29 DIAGNOSIS — Z79891 Long term (current) use of opiate analgesic: Secondary | ICD-10-CM

## 2021-08-29 DIAGNOSIS — Z6836 Body mass index (BMI) 36.0-36.9, adult: Secondary | ICD-10-CM | POA: Diagnosis not present

## 2021-08-29 DIAGNOSIS — Z993 Dependence on wheelchair: Secondary | ICD-10-CM | POA: Diagnosis not present

## 2021-08-29 DIAGNOSIS — Z7984 Long term (current) use of oral hypoglycemic drugs: Secondary | ICD-10-CM | POA: Diagnosis not present

## 2021-08-29 DIAGNOSIS — G4733 Obstructive sleep apnea (adult) (pediatric): Secondary | ICD-10-CM | POA: Diagnosis not present

## 2021-08-29 DIAGNOSIS — Z886 Allergy status to analgesic agent status: Secondary | ICD-10-CM

## 2021-08-29 DIAGNOSIS — N1831 Chronic kidney disease, stage 3a: Secondary | ICD-10-CM | POA: Diagnosis present

## 2021-08-29 DIAGNOSIS — Z7901 Long term (current) use of anticoagulants: Secondary | ICD-10-CM

## 2021-08-29 DIAGNOSIS — M86041 Acute hematogenous osteomyelitis, right hand: Secondary | ICD-10-CM | POA: Diagnosis not present

## 2021-08-29 DIAGNOSIS — L089 Local infection of the skin and subcutaneous tissue, unspecified: Secondary | ICD-10-CM | POA: Diagnosis not present

## 2021-08-29 DIAGNOSIS — E119 Type 2 diabetes mellitus without complications: Secondary | ICD-10-CM | POA: Diagnosis not present

## 2021-08-29 DIAGNOSIS — H409 Unspecified glaucoma: Secondary | ICD-10-CM | POA: Diagnosis not present

## 2021-08-29 DIAGNOSIS — Z89022 Acquired absence of left finger(s): Secondary | ICD-10-CM

## 2021-08-29 DIAGNOSIS — E781 Pure hyperglyceridemia: Secondary | ICD-10-CM | POA: Diagnosis not present

## 2021-08-29 DIAGNOSIS — I10 Essential (primary) hypertension: Secondary | ICD-10-CM | POA: Diagnosis not present

## 2021-08-29 DIAGNOSIS — M86142 Other acute osteomyelitis, left hand: Secondary | ICD-10-CM | POA: Diagnosis not present

## 2021-08-29 DIAGNOSIS — I129 Hypertensive chronic kidney disease with stage 1 through stage 4 chronic kidney disease, or unspecified chronic kidney disease: Secondary | ICD-10-CM | POA: Diagnosis not present

## 2021-08-29 LAB — BASIC METABOLIC PANEL
Anion gap: 13 (ref 5–15)
BUN: 11 mg/dL (ref 8–23)
CO2: 26 mmol/L (ref 22–32)
Calcium: 10.1 mg/dL (ref 8.9–10.3)
Chloride: 98 mmol/L (ref 98–111)
Creatinine, Ser: 1.28 mg/dL — ABNORMAL HIGH (ref 0.61–1.24)
GFR, Estimated: 56 mL/min — ABNORMAL LOW (ref >60)
Glucose, Bld: 259 mg/dL — ABNORMAL HIGH (ref 70–99)
Potassium: 3.9 mmol/L (ref 3.5–5.1)
Sodium: 137 mmol/L (ref 135–145)

## 2021-08-29 LAB — LACTIC ACID, PLASMA
Lactic Acid, Venous: 3.7 mmol/L (ref 0.5–1.9)
Lactic Acid, Venous: 3 mmol/L (ref 0.5–1.9)

## 2021-08-29 LAB — CBC
HCT: 40 % (ref 39.0–52.0)
Hemoglobin: 13.2 g/dL (ref 13.0–17.0)
MCH: 29.7 pg (ref 26.0–34.0)
MCHC: 33 g/dL (ref 30.0–36.0)
MCV: 90.1 fL (ref 80.0–100.0)
Platelets: 251 10*3/uL (ref 150–400)
RBC: 4.44 MIL/uL (ref 4.22–5.81)
RDW: 13.1 % (ref 11.5–15.5)
WBC: 10.2 10*3/uL (ref 4.0–10.5)
nRBC: 0 % (ref 0.0–0.2)

## 2021-08-29 LAB — HEMOGLOBIN A1C
Hgb A1c MFr Bld: 6.9 % — ABNORMAL HIGH (ref 4.8–5.6)
Mean Plasma Glucose: 151.33 mg/dL

## 2021-08-29 LAB — SEDIMENTATION RATE: Sed Rate: 47 mm/hr — ABNORMAL HIGH (ref 0–16)

## 2021-08-29 LAB — C-REACTIVE PROTEIN: CRP: 1.9 mg/dL — ABNORMAL HIGH (ref <1.0)

## 2021-08-29 MED ORDER — TRAZODONE HCL 50 MG PO TABS
50.0000 mg | ORAL_TABLET | Freq: Every day | ORAL | Status: DC
  Administered 2021-08-30: 50 mg via ORAL
  Filled 2021-08-29: qty 1

## 2021-08-29 MED ORDER — GABAPENTIN 600 MG PO TABS
600.0000 mg | ORAL_TABLET | Freq: Two times a day (BID) | ORAL | Status: DC
  Administered 2021-08-29 – 2021-08-31 (×4): 600 mg via ORAL
  Filled 2021-08-29 (×4): qty 1

## 2021-08-29 MED ORDER — VANCOMYCIN HCL 10 G IV SOLR
2250.0000 mg | Freq: Once | INTRAVENOUS | Status: AC
  Administered 2021-08-29: 2250 mg via INTRAVENOUS
  Filled 2021-08-29: qty 22.5

## 2021-08-29 MED ORDER — SODIUM CHLORIDE 0.9 % IV SOLN
2.0000 g | INTRAVENOUS | Status: DC
  Administered 2021-08-29 – 2021-08-30 (×2): 2 g via INTRAVENOUS
  Filled 2021-08-29 (×3): qty 20

## 2021-08-29 MED ORDER — APIXABAN 5 MG PO TABS
5.0000 mg | ORAL_TABLET | Freq: Two times a day (BID) | ORAL | Status: DC
  Administered 2021-08-29 – 2021-08-31 (×3): 5 mg via ORAL
  Filled 2021-08-29 (×4): qty 1

## 2021-08-29 MED ORDER — DOCUSATE SODIUM 100 MG PO CAPS
100.0000 mg | ORAL_CAPSULE | Freq: Two times a day (BID) | ORAL | Status: DC
  Administered 2021-08-29 – 2021-08-31 (×4): 100 mg via ORAL
  Filled 2021-08-29 (×4): qty 1

## 2021-08-29 MED ORDER — LACTATED RINGERS IV BOLUS
1000.0000 mL | Freq: Once | INTRAVENOUS | Status: AC
  Administered 2021-08-29: 1000 mL via INTRAVENOUS

## 2021-08-29 MED ORDER — SENNA 8.6 MG PO TABS
1.0000 | ORAL_TABLET | Freq: Two times a day (BID) | ORAL | Status: DC
  Administered 2021-08-30 – 2021-08-31 (×3): 8.6 mg via ORAL
  Filled 2021-08-29 (×3): qty 1

## 2021-08-29 MED ORDER — INSULIN ASPART 100 UNIT/ML IJ SOLN
0.0000 [IU] | Freq: Three times a day (TID) | INTRAMUSCULAR | Status: DC
  Administered 2021-08-30: 2 [IU] via SUBCUTANEOUS

## 2021-08-29 MED ORDER — ACETAMINOPHEN 325 MG PO TABS: 650.0000 mg | ORAL_TABLET | Freq: Four times a day (QID) | ORAL | Status: DC | PRN

## 2021-08-29 MED ORDER — ONDANSETRON HCL 4 MG PO TABS: 4.0000 mg | ORAL_TABLET | Freq: Four times a day (QID) | ORAL | Status: DC | PRN

## 2021-08-29 MED ORDER — DULOXETINE HCL 60 MG PO CPEP
60.0000 mg | ORAL_CAPSULE | Freq: Every day | ORAL | Status: DC
  Administered 2021-08-30 – 2021-08-31 (×2): 60 mg via ORAL
  Filled 2021-08-29: qty 1
  Filled 2021-08-29: qty 2

## 2021-08-29 MED ORDER — METRONIDAZOLE 500 MG/100ML IV SOLN
500.0000 mg | Freq: Two times a day (BID) | INTRAVENOUS | Status: DC
  Administered 2021-08-29 – 2021-08-31 (×4): 500 mg via INTRAVENOUS
  Filled 2021-08-29 (×4): qty 100

## 2021-08-29 MED ORDER — ACETAMINOPHEN 650 MG RE SUPP: 650.0000 mg | Freq: Four times a day (QID) | RECTAL | Status: DC | PRN

## 2021-08-29 MED ORDER — CARVEDILOL 6.25 MG PO TABS
6.2500 mg | ORAL_TABLET | Freq: Two times a day (BID) | ORAL | Status: DC
  Administered 2021-08-29 – 2021-08-31 (×4): 6.25 mg via ORAL
  Filled 2021-08-29: qty 1
  Filled 2021-08-29 (×2): qty 2
  Filled 2021-08-29 (×2): qty 1

## 2021-08-29 MED ORDER — VANCOMYCIN HCL 1250 MG/250ML IV SOLN
1250.0000 mg | INTRAVENOUS | Status: DC
  Administered 2021-08-30: 1250 mg via INTRAVENOUS
  Filled 2021-08-29 (×2): qty 250

## 2021-08-29 MED ORDER — ONDANSETRON HCL 4 MG/2ML IJ SOLN: 4.0000 mg | Freq: Four times a day (QID) | INTRAMUSCULAR | Status: DC | PRN

## 2021-08-29 MED ORDER — OXYCODONE-ACETAMINOPHEN 5-325 MG PO TABS
1.0000 | ORAL_TABLET | Freq: Four times a day (QID) | ORAL | Status: DC | PRN
  Administered 2021-08-29 – 2021-08-30 (×2): 1 via ORAL
  Filled 2021-08-29 (×2): qty 1

## 2021-08-29 MED ORDER — SODIUM CHLORIDE 0.9 % IV SOLN: INTRAVENOUS | Status: DC

## 2021-08-29 NOTE — ED Notes (Signed)
Per ED provider, delay drawing second lactic for correction prior to re-draw.

## 2021-08-29 NOTE — ED Provider Triage Note (Signed)
Emergency Medicine Provider Triage Evaluation Note  [name] , a 81 y.o. male  was evaluated in triage.  Pt complains of patient was told to report to the emergency department due to wound infection and right index finger.  He had an x-ray 3 days ago which showed bone degeneration concerning for osteomyelitis.  Patient with previous finger amputations, history of diabetes.  He denies any systemic fever, chills.  Review of Systems  Positive: Finger infection Negative: Fever, chills  Physical Exam  BP 136/73   Pulse 83   Temp 98 F (36.7 C)   Resp 18   Ht '5\' 9"'$  (1.753 m)   Wt 111.1 kg   SpO2 95%   BMI 36.18 kg/m  Gen:   Awake, no distress   Resp:  Normal effort  MSK:   Moves extremities without difficulty  Other:  Wound infection noted right index finger, foul-smelling drainage, radial, ulnar pulses intact in the affected extremity.  Medical Decision Making  Medically screening exam initiated at 2:23 PM.  Appropriate orders placed.  [name] was informed that the remainder of the evaluation will be completed by another provider, this initial triage assessment does not replace that evaluation, and the importance of remaining in the ED until their evaluation is complete.  Workup initiated   [name], [place] 08/29/21 1425

## 2021-08-29 NOTE — ED Provider Notes (Signed)
[facility] EMERGENCY DEPARTMENT Provider Note   CSN: [id number] Arrival date & time: 08/29/21  1300     History  Chief Complaint  Patient presents with   Wound Infection    [name] is a 81 y.o. male.  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  Patient started having symptoms earlier in the week.  He started to notice redness and swelling.  He was seen by one of the providers at the [place] [facility].  Patient had an x-ray performed on August 18.  The results showed that the patient has evidence of osteomyelitis in the index and long finger at the distal phalanges.  Patient was called by his doctor instructed to come to the ED.  Patient states he has been taking the antibiotics that was prescribed.  Looks a little less red than it did before.  It still draining.  He is not having any fevers.  No increasing pain but his sensation is very diminished because of his neuropathy.  Home Medications Prior to Admission medications   Medication Sig Start Date End Date Taking? Authorizing Provider  acetaZOLAMIDE (DIAMOX) 250 MG tablet Take 250 mg by mouth 4 (four) times daily. 02/10/21   [provider]  apixaban (ELIQUIS) 5 MG TABS tablet Take 5 mg by mouth 2 (two) times daily.    [provider]  carvedilol (COREG) 6.25 MG tablet Take 6.25 mg by mouth 2 (two) times daily with a meal. 02/09/21   [provider]  COMBIGAN 0.2-0.5 % ophthalmic solution Place 1 drop into the right eye 2 (two) times daily. 07/25/19   [provider]  Dulaglutide (TRULICITY) 1.5 TK/2.4OX SOPN Inject 1.5 mg into the skin every Saturday.    [provider]  DULoxetine (CYMBALTA) 60 MG capsule Take 60 mg by mouth daily.    [provider]  gabapentin (NEURONTIN) 600 MG  tablet Take 600 mg by mouth 2 (two) times daily.    [provider]  metFORMIN (GLUCOPHAGE) 500 MG tablet Take 1,000 mg by mouth 2 (two) times daily with a meal. 02/09/21   [provider]  Multiple Vitamin (MULTIVITAMIN) tablet Take 1 tablet by mouth daily.    [provider]  Netarsudil-Latanoprost (ROCKLATAN) 0.02-0.005 % SOLN Place 1 drop into both eyes at bedtime. 11/14/19   [provider]  ofloxacin (OCUFLOX) 0.3 % ophthalmic solution Place 1 drop into the right eye 4 (four) times daily. 02/09/21   [provider]  oxyCODONE-acetaminophen (PERCOCET/ROXICET) 5-325 MG tablet Take 1 tablet by mouth in the morning and at bedtime. 02/09/21   [provider]  polyethylene glycol (MIRALAX / GLYCOLAX) 17 g packet Take 17 g by mouth daily as needed for moderate constipation. Patient not taking: Reported on 02/25/2021 04/14/20   Shelly Coss, MD  Potassium Chloride ER 20 MEQ TBCR Take 1 tablet by mouth daily. 02/09/21   [provider]  prednisoLONE acetate (PRED FORTE) 1 % ophthalmic suspension Place 1 drop into the right eye 4 (four) times daily. 02/09/21   [provider]  traZODone (DESYREL) 100 MG tablet Take 100 mg by mouth at bedtime. Take along with 50 mg tablet=150 mg 02/09/21   [provider]  traZODone (DESYREL) 150 MG tablet Take 50 mg by mouth at bedtime. Take along with 100 mg tablet=150  mg 01/19/20   [provider]      Allergies    Ambien [zolpidem tartrate], Amlodipine besylate, Aspirin, and Dorzolamide hcl-timolol mal    Review of Systems   Review of Systems  Physical Exam Updated Vital Signs BP 136/65   Pulse 78   Temp 98 F (36.7 C)   Resp 18   Ht 1.753 m ('5\' 9"'$ )   Wt 111.1 kg   SpO2 95%   BMI 36.18 kg/m  Physical Exam Vitals and nursing note reviewed.  Constitutional:      General: He is not in acute distress.    Appearance: He is well-developed.  HENT:     Head: Normocephalic and  atraumatic.     Right Ear: External ear normal.     Left Ear: External ear normal.  Eyes:     General: No scleral icterus.       Right eye: No discharge.        Left eye: No discharge.     Conjunctiva/sclera: Conjunctivae normal.  Neck:     Trachea: No tracheal deviation.  Cardiovascular:     Rate and Rhythm: Normal rate and regular rhythm.  Pulmonary:     Effort: Pulmonary effort is normal. No respiratory distress.     Breath sounds: Normal breath sounds. No stridor.  Abdominal:     General: There is no distension.  Musculoskeletal:        General: Swelling and deformity present.     Cervical back: Neck supple.     Comments: Swelling, skin breakdown, mucopurulent drainage noted from the tip of the index finger, no significant erythema noted of the long finger.  Patient does have a flexion contracture of the index finger  Skin:    General: Skin is warm and dry.     Findings: No rash.  Neurological:     Mental Status: He is alert.     Cranial Nerves: Cranial nerve deficit: no gross deficits.       ED Results / Procedures / Treatments   Labs (all labs ordered are listed, but only abnormal results are displayed) Labs Reviewed  BASIC METABOLIC PANEL - Abnormal; Notable for the following components:      Result Value   Glucose, Bld 259 (*)    Creatinine, Ser 1.28 (*)    GFR, Estimated 56 (*)    All other components within normal limits  LACTIC ACID, PLASMA - Abnormal; Notable for the following components:   Lactic Acid, Venous 3.7 (*)    All other components within normal limits  SEDIMENTATION RATE - Abnormal; Notable for the following components:   Sed Rate 47 (*)    All other components within normal limits  C-REACTIVE PROTEIN - Abnormal; Notable for the following components:   CRP 1.9 (*)    All other components within normal limits  CBC  LACTIC ACID, PLASMA    EKG None  Radiology No results found.  Procedures Procedures    Medications Ordered in  ED Medications  lactated ringers bolus 1,000 mL (has no administration in time range)  cefTRIAXone (ROCEPHIN) 2 g in sodium chloride 0.9 % 100 mL IVPB (has no administration in time range)    And  metroNIDAZOLE (FLAGYL) IVPB 500 mg (has no administration in time range)    ED Course/ Medical Decision Making/ A&P Clinical Course as of 08/29/21 1648  Mon Aug 29, 2021  1620 CBC does not show leukocytosis.  Metabolic panel does show hyperglycemia.  Patient does have  elevated lactic acid level as well was an elevated C-reactive protein [JK]  1635 Case discussed with [facility].  Will see pt in consultation [JK]  1645 Case discussed with Dr Dwyane Dee regarding admission [JK]    Clinical Course User Index [JK] Dorie Rank, MD                           Medical Decision Making Amount and/or Complexity of Data Reviewed External Data Reviewed: radiology.    Details: reviewed xray from [place]: ordered. Decision-making details documented in ED Course.  Risk Prescription drug management. Drug therapy requiring intensive monitoring for toxicity. Decision regarding hospitalization.   Patient presents ED for worsening finger infection.  Patient's x-rays suggest osteomyelitis.  Patient does have evidence of mucopurulent drainage at the tip of his finger.  He is at risk for progression of his disease because of his history of diabetes and neuropathy.  Patient also has had previous finger amputation.  He may require surgical debridement.  Patient does have an elevated lactic acid level but no leukocytosis no fever.  No hypotension or tachycardia at this time to suggest evolving sepsis.  I have started the patient on IV antibiotics and IV fluids.  I will consult with orthopedic hand surgery and plan on admission to the medical service.        Final Clinical Impression(s) / ED Diagnoses Final diagnoses:  Osteomyelitis of left hand, unspecified type ([place])  Type 2 diabetes mellitus with  diabetic polyneuropathy, unspecified whether long term insulin use [facility])    Rx / DC Orders ED Discharge Orders     None         Dorie Rank, MD 08/29/21 [phone]

## 2021-08-29 NOTE — Anesthesia Preprocedure Evaluation (Signed)
Anesthesia Evaluation  Patient identified by MRN, date of birth, ID band Patient awake    Reviewed: Allergy & Precautions, NPO status , Patient's Chart, lab work & pertinent test results  History of Anesthesia Complications Negative for: history of anesthetic complications  Airway Mallampati: I   Neck ROM: Full    Dental  (+) Edentulous Upper, Edentulous Lower, Dental Advisory Given   Pulmonary sleep apnea (does not use CPAP) , COPD,  COPD inhaler, former smoker,    Pulmonary exam normal        Cardiovascular hypertension, Pt. on medications and Pt. on home beta blockers + DOE  + dysrhythmias Atrial Fibrillation + Valvular Problems/Murmurs (mild-mod) AS  Rhythm:Regular Rate:Normal + Systolic murmurs '21 Stress Myoview:Nuclear stress EF: 51%, no wall motion abnormalities, no ST segment deviation noted during stress. This is a low risk study, with No evidence of ischemia or infarction.  '21 ECHO: low normal LVF with EF 55-60%, grade 1 DD, Mild-moderate aortic valve stenosis. AVA VTI measures 1.37 cm, mean gradient 15.0 mmHg, Vmax  2.56 m/s. mild dilatation of the ascending aorta, 42 mm.    Neuro/Psych Diabetic neuropathy    GI/Hepatic negative GI ROS, Neg liver ROS,   Endo/Other  diabetes, Oral Hypoglycemic Agents  Renal/GU Renal InsufficiencyRenal disease (creat 1.51)     Musculoskeletal   Abdominal   Peds  Hematology eliquis   Anesthesia Other Findings   Reproductive/Obstetrics                            Anesthesia Physical  Anesthesia Plan  ASA: 3  Anesthesia Plan: General   Post-op Pain Management: Tylenol PO (pre-op)*   Induction: Intravenous  PONV Risk Score and Plan: 2 and Ondansetron and Dexamethasone  Airway Management Planned: LMA  Additional Equipment: None  Intra-op Plan:   Post-operative Plan: Extubation in OR  Informed Consent: I have reviewed the patients  History and Physical, chart, labs and discussed the procedure including the risks, benefits and alternatives for the proposed anesthesia with the patient or authorized representative who has indicated his/her understanding and acceptance.     Dental advisory given  Plan Discussed with: CRNA and Anesthesiologist  Anesthesia Plan Comments:        Anesthesia Quick Evaluation

## 2021-08-29 NOTE — H&P (View-Only) (Signed)
HAND SURGERY CONSULTATION  REQUESTING PHYSICIAN: Shawna Clamp, MD   Chief Complaint: Infected right index finger  HPI: ARTEMIS LOYAL is a 81 y.o. male who presents with an infection involving the right index finger.  He has a history of type 2 diabetes with peripheral neuropathy, morbid obesity, paroxysmal A-fib on Eliquis and is status post right ring finger ray resection for infection.  He has limited function of his right hand secondary to severe stiffness and joint contractures.  He developed swelling and erythema of the right index finger over the last week or so.  He was seen by his primary care physician who performed x-rays.  X-rays were suggestive of osteomyelitis involving the distal phalanx of the index finger and he was referred to the ER for further management.  Most of the history is provided by his family at bedside.  They note that he has multiple small injuries involving the hands secondary to his loss of sensation and neuropathy.  They are unsure what was the precipitating event for this particular infection.  He denies any fevers, chills, chest pain, shortness of breath, or other systemic symptoms tonight.  Hand dominance: Right   Past Medical History:  Diagnosis Date   Adenomatous colon polyp    Allergic rhinitis    BMI 40.0-44.9, adult (HCC)    Chronic insomnia    Chronic low back pain    Constipation, unspecified    Diabetes mellitus ([place]) 2015   DJD (degenerative joint disease) of knee    Hypercalcemia    Hypertension    Hypertriglyceridemia    Junctional tachycardia (HCC)    Lumbar facet arthropathy    Morbid obesity (HCC)    Neuropathy    Nonallergic rhinitis    OSA (obstructive sleep apnea)    Other chronic pain    Peripheral axonal neuropathy    Upper GI bleed    due to aspirin   Venous insufficiency    Venous stasis dermatitis of both lower extremities    Past Surgical History:  Procedure Laterality Date   cataract     Bilateral   GLAUCOMA  SURGERY     HAND SURGERY Left    s/p amputation of left finger   I & D EXTREMITY Right 04/10/2020   Procedure: IRRIGATION AND DEBRIDEMENT AND AMPUTATION OF RIGHT RING FINGER;  Surgeon: Iran Planas, MD;  Location: [facility];  Service: Orthopedics;  Laterality: Right;   Social History   Socioeconomic History   Marital status: Married    Spouse name: Not on file   Number of children: Not on file   Years of education: Not on file   Highest education level: Not on file  Occupational History   Not on file  Tobacco Use   Smoking status: Former   Smokeless tobacco: Never   Tobacco comments:    Quit 20 years ago.    Substance and Sexual Activity   Alcohol use: No   Drug use: No   Sexual activity: Not on file  Other Topics Concern   Not on file  Social History Narrative   Lives with wife.  They have two grown sons.   Retired Engineer, maintenance.   Highest level of education:  GED   Social Determinants of Health   Financial Resource Strain: Not on file  Food Insecurity: Not on file  Transportation Needs: Not on file  Physical Activity: Not on file  Stress: Not on file  Social Connections: Not on file   Family History  Problem Relation Age of Onset   Gout Father    Osteoarthritis Mother        Deceased, 60   Healthy Brother        x2   Healthy Son        x2   - negative except otherwise stated in the family history section Allergies  Allergen Reactions   Ambien [Zolpidem Tartrate]     "lost his mind"   Amlodipine Besylate Other (See Comments)    Other reaction(s): edema edema    Aspirin     Bleeding ulcer   Dorzolamide Hcl-Timolol Mal     Other reaction(s): Other (See Comments), red and burning   Prior to Admission medications   Medication Sig Start Date End Date Taking? Authorizing Provider  acetaZOLAMIDE (DIAMOX) 250 MG tablet Take 250 mg by mouth 4 (four) times daily. 02/10/21   [provider]  apixaban (ELIQUIS) 5 MG TABS tablet Take 5 mg by mouth 2 (two)  times daily.    [provider]  carvedilol (COREG) 6.25 MG tablet Take 6.25 mg by mouth 2 (two) times daily with a meal. 02/09/21   [provider]  COMBIGAN 0.2-0.5 % ophthalmic solution Place 1 drop into the right eye 2 (two) times daily. 07/25/19   [provider]  Dulaglutide (TRULICITY) 1.5 YH/0.6CB SOPN Inject 1.5 mg into the skin every Saturday.    [provider]  DULoxetine (CYMBALTA) 60 MG capsule Take 60 mg by mouth daily.    [provider]  gabapentin (NEURONTIN) 600 MG tablet Take 600 mg by mouth 2 (two) times daily.    [provider]  metFORMIN (GLUCOPHAGE) 500 MG tablet Take 1,000 mg by mouth 2 (two) times daily with a meal. 02/09/21   [provider]  Multiple Vitamin (MULTIVITAMIN) tablet Take 1 tablet by mouth daily.    [provider]  Netarsudil-Latanoprost (ROCKLATAN) 0.02-0.005 % SOLN Place 1 drop into both eyes at bedtime. 11/14/19   [provider]  ofloxacin (OCUFLOX) 0.3 % ophthalmic solution Place 1 drop into the right eye 4 (four) times daily. 02/09/21   [provider]  oxyCODONE-acetaminophen (PERCOCET/ROXICET) 5-325 MG tablet Take 1 tablet by mouth in the morning and at bedtime. 02/09/21   [provider]  polyethylene glycol (MIRALAX / GLYCOLAX) 17 g packet Take 17 g by mouth daily as needed for moderate constipation. Patient not taking: Reported on 02/25/2021 04/14/20   Shelly Coss, MD  Potassium Chloride ER 20 MEQ TBCR Take 1 tablet by mouth daily. 02/09/21   [provider]  prednisoLONE acetate (PRED FORTE) 1 % ophthalmic suspension Place 1 drop into the right eye 4 (four) times daily. 02/09/21   [provider]  traZODone (DESYREL) 100 MG tablet Take 100 mg by mouth at bedtime. Take along with 50 mg tablet=150 mg 02/09/21   [provider]  traZODone (DESYREL) 150 MG tablet Take 50 mg by mouth at bedtime. Take along with 100 mg tablet=150 mg 01/19/20    [provider]   No results found. - Positive ROS: All other systems have been reviewed and were otherwise negative with the exception of those mentioned in the HPI and as above.  Physical Exam: General: No acute distress, resting comfortably Cardiovascular: BUE warm and well perfused, normal rate Respiratory: Normal WOB on RA Skin: Warm and dry Neurologic: Sensation intact distally Psychiatric: Patient is at baseline mood and affect  Right hand: Abscess involving the distal aspect of the index  finger from the level of the mid aspect of middle phalanx to the tip.  There is drainage from the distal aspect of the finger.  The skin proximal to the PIP joint is warm and erythematous but without fluctuance or obvious abscess.  He has some healing wounds at various places over his hand including the ulnar aspect of the thumb and over the dorsal aspect of the middle finger.  He is status post ring finger ray resection resection hand.  All his fingers are warm and well-perfused.   Assessment: 81 year old male with above past medical history presents with a right index finger infection with underlying osteomyelitis.  Plan: Plan for right index finger amputation tomorrow afternoon.  Please keep patient n.p.o. at midnight.  We will take intraoperative cultures.    Thank you for the consult and the opportunity to see Mr. Bodnar  Sherilyn Cooter, M.D. [facility] 9:23 PM

## 2021-08-29 NOTE — ED Triage Notes (Signed)
Patient has wound infection to right index finger.  Had xray 3 days ago and results came back as bone was compromised and was sent here for iv ANTBIOTICS.

## 2021-08-29 NOTE — ED Notes (Signed)
RN attempted for IV x2, IV team consult entered.

## 2021-08-29 NOTE — Consult Note (Signed)
HAND SURGERY CONSULTATION  REQUESTING PHYSICIAN: Shawna Clamp, MD   Chief Complaint: Infected right index finger  HPI: [name] is a 81 y.o. male who presents with an infection involving the right index finger.  He has a history of type 2 diabetes with peripheral neuropathy, morbid obesity, paroxysmal A-fib on Eliquis and is status post right ring finger ray resection for infection.  He has limited function of his right hand secondary to severe stiffness and joint contractures.  He developed swelling and erythema of the right index finger over the last week or so.  He was seen by his primary care physician who performed x-rays.  X-rays were suggestive of osteomyelitis involving the distal phalanx of the index finger and he was referred to the ER for further management.  Most of the history is provided by his family at bedside.  They note that he has multiple small injuries involving the hands secondary to his loss of sensation and neuropathy.  They are unsure what was the precipitating event for this particular infection.  He denies any fevers, chills, chest pain, shortness of breath, or other systemic symptoms tonight.  Hand dominance: Right   Past Medical History:  Diagnosis Date   Adenomatous colon polyp    Allergic rhinitis    BMI 40.0-44.9, adult (HCC)    Chronic insomnia    Chronic low back pain    Constipation, unspecified    Diabetes mellitus ([place]) 2015   DJD (degenerative joint disease) of knee    Hypercalcemia    Hypertension    Hypertriglyceridemia    Junctional tachycardia (HCC)    Lumbar facet arthropathy    Morbid obesity (HCC)    Neuropathy    Nonallergic rhinitis    OSA (obstructive sleep apnea)    Other chronic pain    Peripheral axonal neuropathy    Upper GI bleed    due to aspirin   Venous insufficiency    Venous stasis dermatitis of both lower extremities    Past Surgical History:  Procedure Laterality Date   cataract     Bilateral   GLAUCOMA  SURGERY     HAND SURGERY Left    s/p amputation of left finger   I & D EXTREMITY Right 04/10/2020   Procedure: IRRIGATION AND DEBRIDEMENT AND AMPUTATION OF RIGHT RING FINGER;  Surgeon: Iran Planas, MD;  Location: [facility];  Service: Orthopedics;  Laterality: Right;   Social History   Socioeconomic History   Marital status: Married    Spouse name: Not on file   Number of children: Not on file   Years of education: Not on file   Highest education level: Not on file  Occupational History   Not on file  Tobacco Use   Smoking status: Former   Smokeless tobacco: Never   Tobacco comments:    Quit 20 years ago.    Substance and Sexual Activity   Alcohol use: No   Drug use: No   Sexual activity: Not on file  Other Topics Concern   Not on file  Social History Narrative   Lives with wife.  They have two grown sons.   Retired Engineer, maintenance.   Highest level of education:  GED   Social Determinants of Health   Financial Resource Strain: Not on file  Food Insecurity: Not on file  Transportation Needs: Not on file  Physical Activity: Not on file  Stress: Not on file  Social Connections: Not on file   Family History  Problem Relation Age of Onset   Gout Father    Osteoarthritis Mother        Deceased, 28   Healthy Brother        x2   Healthy Son        x2   - negative except otherwise stated in the family history section Allergies  Allergen Reactions   Ambien [Zolpidem Tartrate]     "lost his mind"   Amlodipine Besylate Other (See Comments)    Other reaction(s): edema edema    Aspirin     Bleeding ulcer   Dorzolamide Hcl-Timolol Mal     Other reaction(s): Other (See Comments), red and burning   Prior to Admission medications   Medication Sig Start Date End Date Taking? Authorizing Provider  acetaZOLAMIDE (DIAMOX) 250 MG tablet Take 250 mg by mouth 4 (four) times daily. 02/10/21   [provider]  apixaban (ELIQUIS) 5 MG TABS tablet Take 5 mg by mouth 2 (two)  times daily.    [provider]  carvedilol (COREG) 6.25 MG tablet Take 6.25 mg by mouth 2 (two) times daily with a meal. 02/09/21   [provider]  COMBIGAN 0.2-0.5 % ophthalmic solution Place 1 drop into the right eye 2 (two) times daily. 07/25/19   [provider]  Dulaglutide (TRULICITY) 1.5 NT/7.0YF SOPN Inject 1.5 mg into the skin every Saturday.    [provider]  DULoxetine (CYMBALTA) 60 MG capsule Take 60 mg by mouth daily.    [provider]  gabapentin (NEURONTIN) 600 MG tablet Take 600 mg by mouth 2 (two) times daily.    [provider]  metFORMIN (GLUCOPHAGE) 500 MG tablet Take 1,000 mg by mouth 2 (two) times daily with a meal. 02/09/21   [provider]  Multiple Vitamin (MULTIVITAMIN) tablet Take 1 tablet by mouth daily.    [provider]  Netarsudil-Latanoprost (ROCKLATAN) 0.02-0.005 % SOLN Place 1 drop into both eyes at bedtime. 11/14/19   [provider]  ofloxacin (OCUFLOX) 0.3 % ophthalmic solution Place 1 drop into the right eye 4 (four) times daily. 02/09/21   [provider]  oxyCODONE-acetaminophen (PERCOCET/ROXICET) 5-325 MG tablet Take 1 tablet by mouth in the morning and at bedtime. 02/09/21   [provider]  polyethylene glycol (MIRALAX / GLYCOLAX) 17 g packet Take 17 g by mouth daily as needed for moderate constipation. Patient not taking: Reported on 02/25/2021 04/14/20   Shelly Coss, MD  Potassium Chloride ER 20 MEQ TBCR Take 1 tablet by mouth daily. 02/09/21   [provider]  prednisoLONE acetate (PRED FORTE) 1 % ophthalmic suspension Place 1 drop into the right eye 4 (four) times daily. 02/09/21   [provider]  traZODone (DESYREL) 100 MG tablet Take 100 mg by mouth at bedtime. Take along with 50 mg tablet=150 mg 02/09/21   [provider]  traZODone (DESYREL) 150 MG tablet Take 50 mg by mouth at bedtime. Take along with 100 mg tablet=150 mg 01/19/20    [provider]   No results found. - Positive ROS: All other systems have been reviewed and were otherwise negative with the exception of those mentioned in the HPI and as above.  Physical Exam: General: No acute distress, resting comfortably Cardiovascular: BUE warm and well perfused, normal rate Respiratory: Normal WOB on RA Skin: Warm and dry Neurologic: Sensation intact distally Psychiatric: Patient is at baseline mood and affect  Right hand: Abscess involving the distal aspect of the index  finger from the level of the mid aspect of middle phalanx to the tip.  There is drainage from the distal aspect of the finger.  The skin proximal to the PIP joint is warm and erythematous but without fluctuance or obvious abscess.  He has some healing wounds at various places over his hand including the ulnar aspect of the thumb and over the dorsal aspect of the middle finger.  He is status post ring finger ray resection resection hand.  All his fingers are warm and well-perfused.   Assessment: 81 year old male with above past medical history presents with a right index finger infection with underlying osteomyelitis.  Plan: Plan for right index finger amputation tomorrow afternoon.  Please keep patient n.p.o. at midnight.  We will take intraoperative cultures.    Thank you for the consult and the opportunity to see Mr. Kamps  Sherilyn Cooter, M.D. [facility] 9:23 PM

## 2021-08-29 NOTE — Progress Notes (Signed)
Pharmacy Antibiotic Note  [name] is a 81 y.o. male for which pharmacy has been consulted for vancomycin dosing for  hand osteomyelitis .  Patient with a history of AF on eliquis, HTN, DM, neuropathy, chronic pain, venous insufficiency, OSA, prior amputation of lt index finger. Patient presenting with rt finger infection. Outpt XR on 8/18 w/ evidence of osteomyelitis in the index and long finger at the distal phalanges.  SCr 1.28 - near baseline WBC 10.2; LA 3.7; T 98 F; HR 83; RR 18  Plan: Vancomycin 2250 mg once then 1250 mg q24hr (eAUC 544.4) unless change in renal function Trend WBC, Fever, Renal function, & Clinical course F/u cultures, clinical course, WBC, fever De-escalate when able Levels at steady state  Height: '5\' 9"'$  (175.3 cm) Weight: 111.1 kg (245 lb) IBW/kg (Calculated) : 70.7  Temp (24hrs), Avg:98 F (36.7 C), Min:98 F (36.7 C), Max:98 F (36.7 C)  Recent Labs  Lab 08/29/21 1410 08/29/21 1440  WBC  --  10.2  CREATININE  --  1.28*  LATICACIDVEN 3.7*  --     Estimated Creatinine Clearance: 54.7 mL/min (A) (by C-G formula based on SCr of 1.28 mg/dL (H)).    Allergies  Allergen Reactions   Ambien [Zolpidem Tartrate]     "lost his mind"   Amlodipine Besylate Other (See Comments)    Other reaction(s): edema edema    Aspirin     Bleeding ulcer   Dorzolamide Hcl-Timolol Mal     Other reaction(s): Other (See Comments), red and burning    Antimicrobials this admission: vancomycin 8/21 >>   Microbiology results: Pending  Thank you for allowing pharmacy to be a part of this patient's care.  Lorelei Pont, PharmD, BCPS 08/29/2021 5:24 PM ED Clinical Pharmacist -  [phone]

## 2021-08-29 NOTE — H&P (Signed)
History and Physical    [name] LEX:[id number] DOB: 1941/01/06 DOA: 08/29/2021  PCP: Lavone Orn, MD   Patient coming from: Home  I have personally briefly reviewed patient's old medical records in [place]  Chief Complaint: Right Index finger wound and drainage for one week.  HPI: [name] is a 81 y.o. male with PMH significant for diabetes mellitus type 2, Essential hypertension, morbid obesity, diabetic neuropathy, peripheral axonal neuropathy, venous stasis dermatitis of lower extremities, obstructive sleep apnea, paroxysmal A-fib on Eliquis, glaucoma, chronic insomnia, presented in the ED with right index finger wound and drainage for 1 week.  Patient has peripheral neuropathy and underwent left hand index finger amputation few years back, he also underwent right ring finger amputation last year in April, He has developed redness and swelling in the right index finger which started draining 1 week ago.  He went to see his primary care doctor who has ordered x-ray, they received a call today that x-ray reported as concerning for osteomyelitis and they were told to come to the ED for admission.  Patient reports having pain in the right index finger, describes pain as sharp and burning 5/ 10 on the pain scale associated with foul-smelling discharge.  Patient is wheelchair-bound for last 2 years due to generalized weakness.  Family denies any fever, chills, weight loss, cough, recent travel, sick contacts.  ED course: He was hemodynamically stable.   Temp 98, HR 78, RR 18, BP 136/65, SPO2 95% on room Labs include: Sodium 137, potassium 3.9, chloride 98, bicarb 26, glucose 259, BUN 11, creatinine 1.28, calcium 10.1, CRP 1.9, lactic acid 3.7, WBC 10.2, hemoglobin 13.2, hematocrit 40.0, MCV 90.1, platelet 251, sed rate 49.  X-ray hand: New erosion of the distal phalanges of the index and long fingers concerning for osteomyelitis.  Review of Systems:  Review of Systems   Constitutional: Negative.   HENT: Negative.    Eyes: Negative.   Respiratory: Negative.    Cardiovascular: Negative.   Gastrointestinal: Negative.   Genitourinary: Negative.   Musculoskeletal: Negative.   Skin:        Erythematous open wound with drainage right  index finger  Neurological:  Positive for weakness.  Endo/Heme/Allergies: Negative.   Psychiatric/Behavioral:  The patient is nervous/anxious and has insomnia.      Past Medical History:  Diagnosis Date   Adenomatous colon polyp    Allergic rhinitis    BMI 40.0-44.9, adult (HCC)    Chronic insomnia    Chronic low back pain    Constipation, unspecified    Diabetes mellitus ([place]) 2015   DJD (degenerative joint disease) of knee    Hypercalcemia    Hypertension    Hypertriglyceridemia    Junctional tachycardia (HCC)    Lumbar facet arthropathy    Morbid obesity (HCC)    Neuropathy    Nonallergic rhinitis    OSA (obstructive sleep apnea)    Other chronic pain    Peripheral axonal neuropathy    Upper GI bleed    due to aspirin   Venous insufficiency    Venous stasis dermatitis of both lower extremities     Past Surgical History:  Procedure Laterality Date   cataract     Bilateral   GLAUCOMA SURGERY     HAND SURGERY Left    s/p amputation of left finger   I & D EXTREMITY Right 04/10/2020   Procedure: IRRIGATION AND DEBRIDEMENT AND AMPUTATION OF RIGHT RING FINGER;  Surgeon: Iran Planas,  MD;  Location: [facility];  Service: Orthopedics;  Laterality: Right;     reports that he has quit smoking. He has never used smokeless tobacco. He reports that he does not drink alcohol and does not use drugs.  Allergies  Allergen Reactions   Ambien [Zolpidem Tartrate]     "lost his mind"   Amlodipine Besylate Other (See Comments)    Other reaction(s): edema edema    Aspirin     Bleeding ulcer   Dorzolamide Hcl-Timolol Mal     Other reaction(s): Other (See Comments), red and burning    Family History  Problem  Relation Age of Onset   Gout Father    Osteoarthritis Mother        Deceased, 25   Healthy Brother        x2   Healthy Son        x2   Family history reviewed and not pertinent.  Prior to Admission medications   Medication Sig Start Date End Date Taking? Authorizing Provider  acetaZOLAMIDE (DIAMOX) 250 MG tablet Take 250 mg by mouth 4 (four) times daily. 02/10/21   [provider]  apixaban (ELIQUIS) 5 MG TABS tablet Take 5 mg by mouth 2 (two) times daily.    [provider]  carvedilol (COREG) 6.25 MG tablet Take 6.25 mg by mouth 2 (two) times daily with a meal. 02/09/21   [provider]  COMBIGAN 0.2-0.5 % ophthalmic solution Place 1 drop into the right eye 2 (two) times daily. 07/25/19   [provider]  Dulaglutide (TRULICITY) 1.5 YS/0.6TK SOPN Inject 1.5 mg into the skin every Saturday.    [provider]  DULoxetine (CYMBALTA) 60 MG capsule Take 60 mg by mouth daily.    [provider]  gabapentin (NEURONTIN) 600 MG tablet Take 600 mg by mouth 2 (two) times daily.    [provider]  metFORMIN (GLUCOPHAGE) 500 MG tablet Take 1,000 mg by mouth 2 (two) times daily with a meal. 02/09/21   [provider]  Multiple Vitamin (MULTIVITAMIN) tablet Take 1 tablet by mouth daily.    [provider]  Netarsudil-Latanoprost (ROCKLATAN) 0.02-0.005 % SOLN Place 1 drop into both eyes at bedtime. 11/14/19   [provider]  ofloxacin (OCUFLOX) 0.3 % ophthalmic solution Place 1 drop into the right eye 4 (four) times daily. 02/09/21   [provider]  oxyCODONE-acetaminophen (PERCOCET/ROXICET) 5-325 MG tablet Take 1 tablet by mouth in the morning and at bedtime. 02/09/21   [provider]  polyethylene glycol (MIRALAX / GLYCOLAX) 17 g packet Take 17 g by mouth daily as needed for moderate constipation. Patient not taking: Reported on 02/25/2021 04/14/20   Shelly Coss, MD  Potassium Chloride ER 20 MEQ TBCR  Take 1 tablet by mouth daily. 02/09/21   [provider]  prednisoLONE acetate (PRED FORTE) 1 % ophthalmic suspension Place 1 drop into the right eye 4 (four) times daily. 02/09/21   [provider]  traZODone (DESYREL) 100 MG tablet Take 100 mg by mouth at bedtime. Take along with 50 mg tablet=150 mg 02/09/21   [provider]  traZODone (DESYREL) 150 MG tablet Take 50 mg by mouth at bedtime. Take along with 100 mg tablet=150 mg 01/19/20   [provider]    Physical Exam: Vitals:   08/29/21 1333 08/29/21 1413 08/29/21 1608  BP: 136/73  136/65  Pulse: 83  78  Resp: 18  18  Temp: 98 F (36.7 C)  SpO2: 95%  95%  Weight:  111.1 kg   Height:  '5\' 9"'$  (1.753 m)     Constitutional: Appears comfortable, not in any acute distress. Vitals:   08/29/21 1333 08/29/21 1413 08/29/21 1608  BP: 136/73  136/65  Pulse: 83  78  Resp: 18  18  Temp: 98 F (36.7 C)    SpO2: 95%  95%  Weight:  111.1 kg   Height:  '5\' 9"'$  (1.753 m)    Eyes: PERRL, lids and conjunctivae normal ENMT: Mucous membranes are moist.  Posterior pharynx without any exudate. Neck: normal, supple, no masses, no thyromegaly Respiratory: CTA bilaterally, normal respiratory effort, no accessory muscle use.  RR 15 Cardiovascular: S1-S2 heard, regular rate and rhythm, no murmur. Abdomen: Soft, non tender, non distended, BS+ Musculoskeletal: Amputated left index finger, amputated right ring finger, erythematous right index finger with open wound draining greenish pus. Skin: no rashes, lesions, ulcers. No induration Neurologic: Alert oriented x3, following full commands.  Strength 5-5 in all 4 limbs. Psychiatric: Normal judgment and insight. Alert and oriented x 3. Normal mood.     Labs on Admission: I have personally reviewed following labs and imaging studies  CBC: Recent Labs  Lab 08/29/21 1440  WBC 10.2  HGB 13.2  HCT 40.0  MCV 90.1  PLT 734   Basic Metabolic Panel: Recent Labs  Lab  08/29/21 1440  NA 137  K 3.9  CL 98  CO2 26  GLUCOSE 259*  BUN 11  CREATININE 1.28*  CALCIUM 10.1   GFR: Estimated Creatinine Clearance: 54.7 mL/min (A) (by C-G formula based on SCr of 1.28 mg/dL (H)). Liver Function Tests: No results for input(s): "AST", "ALT", "ALKPHOS", "BILITOT", "PROT", "ALBUMIN" in the last 168 hours. No results for input(s): "LIPASE", "AMYLASE" in the last 168 hours. No results for input(s): "AMMONIA" in the last 168 hours. Coagulation Profile: No results for input(s): "INR", "PROTIME" in the last 168 hours. Cardiac Enzymes: No results for input(s): "CKTOTAL", "CKMB", "CKMBINDEX", "TROPONINI" in the last 168 hours. BNP (last 3 results) No results for input(s): "PROBNP" in the last 8760 hours. HbA1C: No results for input(s): "HGBA1C" in the last 72 hours. CBG: No results for input(s): "GLUCAP" in the last 168 hours. Lipid Profile: No results for input(s): "CHOL", "HDL", "LDLCALC", "TRIG", "CHOLHDL", "LDLDIRECT" in the last 72 hours. Thyroid Function Tests: No results for input(s): "TSH", "T4TOTAL", "FREET4", "T3FREE", "THYROIDAB" in the last 72 hours. Anemia Panel: No results for input(s): "VITAMINB12", "FOLATE", "FERRITIN", "TIBC", "IRON", "RETICCTPCT" in the last 72 hours. Urine analysis:    Component Value Date/Time   COLORURINE YELLOW 02/25/2021 0016   APPEARANCEUR HAZY (A) 02/25/2021 0016   LABSPEC 1.015 02/25/2021 0016   PHURINE 5.0 02/25/2021 0016   GLUCOSEU NEGATIVE 02/25/2021 0016   HGBUR MODERATE (A) 02/25/2021 0016   BILIRUBINUR NEGATIVE 02/25/2021 0016   KETONESUR NEGATIVE 02/25/2021 0016   PROTEINUR NEGATIVE 02/25/2021 0016   NITRITE NEGATIVE 02/25/2021 0016   LEUKOCYTESUR NEGATIVE 02/25/2021 0016    Radiological Exams on Admission: No results found.  EKG: Ordered.  Please review  Assessment/Plan Principal Problem:   Hand osteomyelitis, right (HCC) Active Problems:   Diabetes mellitus type 2 in obese (HCC)   Glaucoma    PAF (paroxysmal atrial fibrillation) (HCC)   Chronic insomnia   Chronic kidney disease, stage 3a (HCC)   Essential hypertension   HLD (hyperlipidemia)   Obstructive sleep apnea   Right index finger osteomyelitis: Patient presented with right index finger redness, open wound with  drainage for one week. X-ray shows finding concerning for osteomyelitis. Continue IV antibiotics ( ceftriaxone, Flagyl and vancomycin ). Pharmacy consulted for vancomycin dosing. Adequate pain control with oxycodone as needed. Hand surgery consulted , Dr. Robina Ade notified by ED physician CRP and sed rate is elevated, No signs of sepsis noted.  Diabetes mellitus II: Hold metformin and Trulicity. Start regular insulin sliding scale. Carb modified diet.  Obtain hemoglobin A1c  Essential hypertension: Continue Coreg 6.25 mg twice daily  Paroxysmal A-fib: Heart rate is well controlled.   Continue Coreg and Eliquis  Diabetic neuropathy: Continue Cymbalta and gabapentin.  Lactic acidosis: Likely in the setting of osteomyelitis No other signs of sepsis, continue IV hydration, recheck lactic acid.  Morbid obesity: Diet and exercise discussed in detail. Body mass index is 36.18 kg/m.   Glaucoma: Continue home antiglaucoma medications.  Chronic insomnia: Continue trazodone 150 mg at bedtime.  CKD stage IIIa: Serum creatinine at baseline.  Remains between 1.2-1.4. Continue IV hydration.  Generalized weakness: Patient is wheelchair-bound for last 2 years. PT and OT eval.   DVT prophylaxis: Eliquis Code Status: Full code Family Communication: Wife and son at bedside Disposition Plan:  Status is: Inpatient Remains inpatient appropriate because: Admitted for right hand osteomyelitis requiring IV antibiotics and need and surgical evaluation.  May need amputation.   Consults called: Hand surgery Dr. Robina Ade Admission status: Inpatient   Shawna Clamp MD [facility]  If 7PM-7AM,  please contact night-coverage   08/29/2021, 5:39 PM

## 2021-08-30 ENCOUNTER — Encounter (HOSPITAL_COMMUNITY)
Admission: EM | Disposition: A | Discharge status: Home / Self Care | Payer: Self-pay | Source: Home / Self Care | Attending: Family Medicine

## 2021-08-30 ENCOUNTER — Inpatient Hospital Stay (HOSPITAL_COMMUNITY): Payer: Medicare Other | Admitting: Anesthesiology

## 2021-08-30 ENCOUNTER — Encounter (HOSPITAL_COMMUNITY): Payer: Self-pay | Admitting: Family Medicine

## 2021-08-30 ENCOUNTER — Other Ambulatory Visit: Payer: Self-pay

## 2021-08-30 DIAGNOSIS — Z87891 Personal history of nicotine dependence: Secondary | ICD-10-CM

## 2021-08-30 DIAGNOSIS — L089 Local infection of the skin and subcutaneous tissue, unspecified: Secondary | ICD-10-CM | POA: Diagnosis not present

## 2021-08-30 DIAGNOSIS — I10 Essential (primary) hypertension: Secondary | ICD-10-CM | POA: Diagnosis not present

## 2021-08-30 DIAGNOSIS — J449 Chronic obstructive pulmonary disease, unspecified: Secondary | ICD-10-CM

## 2021-08-30 DIAGNOSIS — M86141 Other acute osteomyelitis, right hand: Secondary | ICD-10-CM | POA: Diagnosis not present

## 2021-08-30 DIAGNOSIS — M86041 Acute hematogenous osteomyelitis, right hand: Secondary | ICD-10-CM | POA: Diagnosis not present

## 2021-08-30 HISTORY — PX: AMPUTATION: SHX166

## 2021-08-30 LAB — CBC
HCT: 42.3 % (ref 39.0–52.0)
Hemoglobin: 13.8 g/dL (ref 13.0–17.0)
MCH: 29.7 pg (ref 26.0–34.0)
MCHC: 32.6 g/dL (ref 30.0–36.0)
MCV: 91.2 fL (ref 80.0–100.0)
Platelets: 225 10*3/uL (ref 150–400)
RBC: 4.64 MIL/uL (ref 4.22–5.81)
RDW: 13.2 % (ref 11.5–15.5)
WBC: 9.1 10*3/uL (ref 4.0–10.5)
nRBC: 0 % (ref 0.0–0.2)

## 2021-08-30 LAB — GLUCOSE, CAPILLARY
Glucose-Capillary: 146 mg/dL — ABNORMAL HIGH (ref 70–99)
Glucose-Capillary: 136 mg/dL — ABNORMAL HIGH (ref 70–99)
Glucose-Capillary: 239 mg/dL — ABNORMAL HIGH (ref 70–99)
Glucose-Capillary: 257 mg/dL — ABNORMAL HIGH (ref 70–99)

## 2021-08-30 LAB — COMPREHENSIVE METABOLIC PANEL
ALT: 18 U/L (ref 0–44)
AST: 25 U/L (ref 15–41)
Albumin: 3.1 g/dL — ABNORMAL LOW (ref 3.5–5.0)
Alkaline Phosphatase: 65 U/L (ref 38–126)
Anion gap: 12 (ref 5–15)
BUN: 11 mg/dL (ref 8–23)
CO2: 27 mmol/L (ref 22–32)
Calcium: 9.7 mg/dL (ref 8.9–10.3)
Chloride: 102 mmol/L (ref 98–111)
Creatinine, Ser: 1.1 mg/dL (ref 0.61–1.24)
GFR, Estimated: >60 mL/min (ref >60)
Glucose, Bld: 152 mg/dL — ABNORMAL HIGH (ref 70–99)
Potassium: 4.1 mmol/L (ref 3.5–5.1)
Sodium: 141 mmol/L (ref 135–145)
Total Bilirubin: 0.7 mg/dL (ref 0.3–1.2)
Total Protein: 7 g/dL (ref 6.5–8.1)

## 2021-08-30 LAB — PHOSPHORUS: Phosphorus: 3.7 mg/dL (ref 2.5–4.6)

## 2021-08-30 LAB — CBG MONITORING, ED: Glucose-Capillary: 144 mg/dL — ABNORMAL HIGH (ref 70–99)

## 2021-08-30 LAB — MAGNESIUM: Magnesium: 1.9 mg/dL (ref 1.7–2.4)

## 2021-08-30 SURGERY — AMPUTATION DIGIT
Anesthesia: General | Laterality: Right

## 2021-08-30 MED ORDER — PHENYLEPHRINE 80 MCG/ML (10ML) SYRINGE FOR IV PUSH (FOR BLOOD PRESSURE SUPPORT)
PREFILLED_SYRINGE | INTRAVENOUS | Status: AC
  Filled 2021-08-30: qty 10

## 2021-08-30 MED ORDER — PROMETHAZINE HCL 25 MG/ML IJ SOLN: 6.2500 mg | INTRAMUSCULAR | Status: DC | PRN

## 2021-08-30 MED ORDER — AMISULPRIDE (ANTIEMETIC) 5 MG/2ML IV SOLN: 10.0000 mg | Freq: Once | INTRAVENOUS | Status: DC | PRN

## 2021-08-30 MED ORDER — ROCURONIUM BROMIDE 10 MG/ML (PF) SYRINGE
PREFILLED_SYRINGE | INTRAVENOUS | Status: AC
  Filled 2021-08-30: qty 10

## 2021-08-30 MED ORDER — LACTATED RINGERS IV SOLN
INTRAVENOUS | Status: DC
Start: 2021-08-30 — End: 2021-08-30

## 2021-08-30 MED ORDER — LIDOCAINE 2% (20 MG/ML) 5 ML SYRINGE
INTRAMUSCULAR | Status: AC
  Filled 2021-08-30: qty 5

## 2021-08-30 MED ORDER — ONDANSETRON HCL 4 MG/2ML IJ SOLN
INTRAMUSCULAR | Status: DC | PRN
  Administered 2021-08-30: 4 mg via INTRAVENOUS

## 2021-08-30 MED ORDER — INSULIN ASPART 100 UNIT/ML IJ SOLN
0.0000 [IU] | Freq: Every day | INTRAMUSCULAR | Status: DC
  Administered 2021-08-30: 3 [IU] via SUBCUTANEOUS

## 2021-08-30 MED ORDER — 0.9 % SODIUM CHLORIDE (POUR BTL) OPTIME
TOPICAL | Status: DC | PRN
  Administered 2021-08-30: 1000 mL

## 2021-08-30 MED ORDER — BUPIVACAINE HCL (PF) 0.25 % IJ SOLN
INTRAMUSCULAR | Status: AC
  Filled 2021-08-30: qty 30

## 2021-08-30 MED ORDER — PROPOFOL 10 MG/ML IV BOLUS
INTRAVENOUS | Status: DC | PRN
  Administered 2021-08-30: 150 mg via INTRAVENOUS

## 2021-08-30 MED ORDER — ORAL CARE MOUTH RINSE
15.0000 mL | Freq: Once | OROMUCOSAL | Status: AC
Start: 2021-08-30 — End: 2021-08-30

## 2021-08-30 MED ORDER — BACITRACIN ZINC 500 UNIT/GM EX OINT
TOPICAL_OINTMENT | CUTANEOUS | Status: AC
  Filled 2021-08-30: qty 28.35

## 2021-08-30 MED ORDER — ONDANSETRON HCL 4 MG/2ML IJ SOLN
INTRAMUSCULAR | Status: AC
  Filled 2021-08-30: qty 4

## 2021-08-30 MED ORDER — DEXAMETHASONE SODIUM PHOSPHATE 10 MG/ML IJ SOLN
INTRAMUSCULAR | Status: DC | PRN
  Administered 2021-08-30: 4 mg via INTRAVENOUS

## 2021-08-30 MED ORDER — CHLORHEXIDINE GLUCONATE 0.12 % MT SOLN
OROMUCOSAL | Status: AC
  Filled 2021-08-30: qty 15

## 2021-08-30 MED ORDER — FENTANYL CITRATE (PF) 250 MCG/5ML IJ SOLN
INTRAMUSCULAR | Status: AC
  Filled 2021-08-30: qty 5

## 2021-08-30 MED ORDER — ACETAMINOPHEN 500 MG PO TABS
1000.0000 mg | ORAL_TABLET | Freq: Once | ORAL | Status: AC
  Administered 2021-08-30: 1000 mg via ORAL

## 2021-08-30 MED ORDER — INSULIN ASPART 100 UNIT/ML IJ SOLN: 0.0000 [IU] | INTRAMUSCULAR | Status: DC | PRN

## 2021-08-30 MED ORDER — PHENYLEPHRINE 80 MCG/ML (10ML) SYRINGE FOR IV PUSH (FOR BLOOD PRESSURE SUPPORT)
PREFILLED_SYRINGE | INTRAVENOUS | Status: DC | PRN
  Administered 2021-08-30: 80 ug via INTRAVENOUS

## 2021-08-30 MED ORDER — FENTANYL CITRATE (PF) 100 MCG/2ML IJ SOLN: 25.0000 ug | INTRAMUSCULAR | Status: DC | PRN

## 2021-08-30 MED ORDER — FENTANYL CITRATE (PF) 250 MCG/5ML IJ SOLN
INTRAMUSCULAR | Status: DC | PRN
  Administered 2021-08-30 (×2): 25 ug via INTRAVENOUS

## 2021-08-30 MED ORDER — INSULIN ASPART 100 UNIT/ML IJ SOLN: 0.0000 [IU] | Freq: Three times a day (TID) | INTRAMUSCULAR | Status: DC

## 2021-08-30 MED ORDER — ACETAMINOPHEN 500 MG PO TABS
ORAL_TABLET | ORAL | Status: AC
  Filled 2021-08-30: qty 2

## 2021-08-30 MED ORDER — DEXAMETHASONE SODIUM PHOSPHATE 10 MG/ML IJ SOLN
INTRAMUSCULAR | Status: AC
  Filled 2021-08-30: qty 2

## 2021-08-30 MED ORDER — CHLORHEXIDINE GLUCONATE 0.12 % MT SOLN
15.0000 mL | Freq: Once | OROMUCOSAL | Status: AC
  Administered 2021-08-30: 15 mL via OROMUCOSAL

## 2021-08-30 MED ORDER — LIDOCAINE 2% (20 MG/ML) 5 ML SYRINGE
INTRAMUSCULAR | Status: DC | PRN
  Administered 2021-08-30: 100 mg via INTRAVENOUS

## 2021-08-30 SURGICAL SUPPLY — 46 items
BAG COUNTER SPONGE SURGICOUNT (BAG) ×1 IMPLANT
BAG SPNG CNTER NS LX DISP (BAG) ×1
BNDG COHESIVE 1X5 TAN STRL LF (GAUZE/BANDAGES/DRESSINGS) ×1 IMPLANT
BNDG CONFORM 2 STRL LF (GAUZE/BANDAGES/DRESSINGS) IMPLANT
BNDG ELASTIC 2X5.8 VLCR STR LF (GAUZE/BANDAGES/DRESSINGS) ×2 IMPLANT
BNDG ELASTIC 3X5.8 VLCR STR LF (GAUZE/BANDAGES/DRESSINGS) IMPLANT
BNDG ELASTIC 4X5.8 VLCR STR LF (GAUZE/BANDAGES/DRESSINGS) IMPLANT
BNDG GAUZE ELAST 4 BULKY (GAUZE/BANDAGES/DRESSINGS) ×2 IMPLANT
CORD BIPOLAR FORCEPS 12FT (ELECTRODE) ×1 IMPLANT
COVER SURGICAL LIGHT HANDLE (MISCELLANEOUS) ×1 IMPLANT
CUFF TOURN SGL QUICK 18X4 (TOURNIQUET CUFF) ×2 IMPLANT
CUFF TOURN SGL QUICK 24 (TOURNIQUET CUFF)
CUFF TRNQT CYL 24X4X16.5-23 (TOURNIQUET CUFF) IMPLANT
DRAPE SURG 17X23 STRL (DRAPES) ×2 IMPLANT
DRSG ADAPTIC 3X8 NADH LF (GAUZE/BANDAGES/DRESSINGS) ×2 IMPLANT
GAUZE SPONGE 2X2 8PLY STRL LF (GAUZE/BANDAGES/DRESSINGS) IMPLANT
GAUZE SPONGE 4X4 12PLY STRL (GAUZE/BANDAGES/DRESSINGS) IMPLANT
GLOVE BIO SURGEON STRL SZ7 (GLOVE) ×2 IMPLANT
GLOVE SURG UNDER POLY LF SZ7 (GLOVE) ×1 IMPLANT
GOWN STRL REUS W/ TWL LRG LVL3 (GOWN DISPOSABLE) ×2 IMPLANT
GOWN STRL REUS W/ TWL XL LVL3 (GOWN DISPOSABLE) ×1 IMPLANT
GOWN STRL REUS W/TWL LRG LVL3 (GOWN DISPOSABLE) ×2
GOWN STRL REUS W/TWL XL LVL3 (GOWN DISPOSABLE) ×1
KIT BASIN OR (CUSTOM PROCEDURE TRAY) ×2 IMPLANT
KIT TURNOVER KIT B (KITS) ×2 IMPLANT
MANIFOLD NEPTUNE II (INSTRUMENTS) ×1 IMPLANT
NDL HYPO 25GX1X1/2 BEV (NEEDLE) IMPLANT
NEEDLE HYPO 25GX1X1/2 BEV (NEEDLE) IMPLANT
NS IRRIG 1000ML POUR BTL (IV SOLUTION) ×2 IMPLANT
PACK ORTHO EXTREMITY (CUSTOM PROCEDURE TRAY) ×2 IMPLANT
PAD ARMBOARD 7.5X6 YLW CONV (MISCELLANEOUS) ×2 IMPLANT
PAD CAST 4YDX4 CTTN HI CHSV (CAST SUPPLIES) IMPLANT
PADDING CAST COTTON 4X4 STRL (CAST SUPPLIES)
SOAP 2 % CHG 4 OZ (WOUND CARE) ×1 IMPLANT
SPECIMEN JAR SMALL (MISCELLANEOUS) ×2 IMPLANT
SUT MERSILENE 4 0 P 3 (SUTURE) IMPLANT
SUT MNCRL AB 4-0 PS2 18 (SUTURE) IMPLANT
SUT PROLENE 4 0 PS 2 18 (SUTURE) IMPLANT
SUT VICRYL RAPIDE 4/0 PS 2 (SUTURE) IMPLANT
SYR CONTROL 10ML LL (SYRINGE) IMPLANT
TOURNI-COT LRG GREEN (MISCELLANEOUS) ×1 IMPLANT
TOURNIQUET ADLT LRG GRN (MISCELLANEOUS) IMPLANT
TOWEL GREEN STERILE (TOWEL DISPOSABLE) ×1 IMPLANT
TOWEL GREEN STERILE FF (TOWEL DISPOSABLE) ×2 IMPLANT
TUBE CONNECTING 12X1/4 (SUCTIONS) IMPLANT
WATER STERILE IRR 1000ML POUR (IV SOLUTION) ×1 IMPLANT

## 2021-08-30 NOTE — Anesthesia Postprocedure Evaluation (Deleted)
Anesthesia Post Note  Patient: [name]  Procedure(s) Performed: Right index finger Amputaion (Right)     Anesthesia Post Evaluation No notable events documented.  Last Vitals:  Vitals:   08/30/21 1048 08/30/21 1255  BP: (!) 149/73 (!) 142/64  Pulse: 73 63  Resp: 17 17  Temp: 36.7 C   SpO2: 93% 98%    Last Pain:  Vitals:   08/30/21 1255  TempSrc:   PainSc: Asleep                 Henrry Feil DANIEL

## 2021-08-30 NOTE — Interval H&P Note (Signed)
History and Physical Interval Note:  08/30/2021 11:36 AM  [name]  has presented today for surgery, with the diagnosis of Right index finger infection.  The various methods of treatment have been discussed with the patient and family. After consideration of risks, benefits and other options for treatment, the patient has consented to  Procedure(s): Right index finger Amputaion (Right) as a surgical intervention.  The patient's history has been reviewed, patient examined, no change in status, stable for surgery.  I have reviewed the patient's chart and labs.  Questions were answered to the patient's satisfaction.     [name]

## 2021-08-30 NOTE — Anesthesia Procedure Notes (Signed)
Procedure Name: LMA Insertion Date/Time: 08/30/2021 12:03 PM  Performed by: Bryson Corona, CRNAPre-anesthesia Checklist: Patient identified, Emergency Drugs available, Suction available and Patient being monitored Patient Re-evaluated:Patient Re-evaluated prior to induction Oxygen Delivery Method: Circle System Utilized Preoxygenation: Pre-oxygenation with 100% oxygen Induction Type: IV induction LMA: LMA with gastric port inserted LMA Size: 4.0 Number of attempts: 1 Placement Confirmation: positive ETCO2 Tube secured with: Tape Dental Injury: Teeth and Oropharynx as per pre-operative assessment

## 2021-08-30 NOTE — Transfer of Care (Signed)
Immediate Anesthesia Transfer of Care Note  Patient: [name]  Procedure(s) Performed: Right index finger Amputaion (Right)  Patient Location: PACU  Anesthesia Type:General  Level of Consciousness: awake and alert   Airway & Oxygen Therapy: Patient Spontanous Breathing and Patient connected to face mask oxygen  Post-op Assessment: Report given to RN and Post -op Vital signs reviewed and stable  Post vital signs: Reviewed and stable  Last Vitals:  Vitals Value Taken Time  BP 142/64 08/30/21 1252  Temp    Pulse 63 08/30/21 1258  Resp 14 08/30/21 1258  SpO2 98 % 08/30/21 1258  Vitals shown include unvalidated device data.  Last Pain:  Vitals:   08/30/21 1119  TempSrc:   PainSc: 0-No pain         Complications: No notable events documented.

## 2021-08-30 NOTE — Anesthesia Postprocedure Evaluation (Signed)
Anesthesia Post Note  Patient: [name]  Procedure(s) Performed: Right index finger Amputaion (Right)     Patient location during evaluation: PACU Anesthesia Type: General Level of consciousness: sedated Pain management: pain level controlled Vital Signs Assessment: post-procedure vital signs reviewed and stable Respiratory status: spontaneous breathing and respiratory function stable Cardiovascular status: stable Postop Assessment: no apparent nausea or vomiting Anesthetic complications: no   No notable events documented.  Last Vitals:  Vitals:   08/30/21 1315 08/30/21 1330  BP: 129/69 (!) 130/59  Pulse: 67 64  Resp: 12 16  Temp:    SpO2: 93% 93%    Last Pain:  Vitals:   08/30/21 1315  TempSrc:   PainSc: Asleep                 Jazaria Jarecki DANIEL

## 2021-08-30 NOTE — ED Notes (Signed)
Breakfast order placed ?

## 2021-08-30 NOTE — Brief Op Note (Signed)
08/30/2021  12:41 PM  PATIENT:  [name]  81 y.o. male  PRE-OPERATIVE DIAGNOSIS:  Right index finger infection  POST-OPERATIVE DIAGNOSIS:  Right index finger infection  PROCEDURE:  Procedure(s): Right index finger Amputaion (Right)  SURGEON:  Surgeon(s) and Role:    * Sherilyn Cooter, MD - Primary  PHYSICIAN ASSISTANT:   ASSISTANTS: none   ANESTHESIA:   general  EBL:  <5 mL  BLOOD ADMINISTERED:none  DRAINS: none   LOCAL MEDICATIONS USED:  NONE  SPECIMEN:  No Specimen  DISPOSITION OF SPECIMEN:  N/A  COUNTS:  YES  TOURNIQUET:  * Missing tourniquet times found for documented tourniquets in log: [id number] *  DICTATION: .Dragon Dictation  PLAN OF CARE: Discharge to home after PACU  PATIENT DISPOSITION:  PACU - hemodynamically stable.   Delay start of Pharmacological VTE agent (>24hrs) due to surgical blood loss or risk of bleeding: not applicable

## 2021-08-30 NOTE — Progress Notes (Signed)
PROGRESS NOTE    [name]  JOA:[id number] DOB: Mar 08, 1940 DOA: 08/29/2021 PCP: Lavone Orn, MD    Brief Narrative:  This 81 y.o. male with PMH significant for diabetes mellitus type 2, Essential hypertension, morbid obesity, diabetic neuropathy, peripheral axonal neuropathy, venous stasis dermatitis of lower extremities, obstructive sleep apnea, paroxysmal A-fib on Eliquis, glaucoma, chronic insomnia, presented in the ED with right index finger wound and drainage for 1 week.  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  Patient is scheduled to have amputation of right index finger today.  Assessment & Plan:   Principal Problem:   Hand osteomyelitis, right (HCC) Active Problems:   Diabetes mellitus type 2 in obese (HCC)   Glaucoma   PAF (paroxysmal atrial fibrillation) (HCC)   Chronic insomnia   Chronic kidney disease, stage 3a (HCC)   Essential hypertension   HLD (hyperlipidemia)   Obstructive sleep apnea   Right index finger osteomyelitis: Patient presented with right index finger redness, open wound with drainage for one week. X-ray shows findings  concerning for osteomyelitis. Continue IV antibiotics ( ceftriaxone, Flagyl and vancomycin ). Pharmacy consulted for vancomycin dosing. Adequate pain control with oxycodone as needed. Hand surgery consulted , Dr. Robina Ade notified by ED physician CRP and sed rate is elevated, No signs of sepsis noted. Patient is scheduled to have right index finger amputation today.   Diabetes mellitus II: Hold metformin and Trulicity. Continue regular insulin sliding scale. Carb modified diet.   Hemoglobin A1c 6.9.   Essential hypertension: Continue Coreg 6.25 mg twice daily.   Paroxysmal A-fib: Heart rate is well controlled.   Continue  Coreg and Eliquis.   Diabetic neuropathy: Continue Cymbalta and gabapentin..   Lactic acidosis: Likely in the setting of osteomyelitis No other signs of sepsis, continue IV hydration, Recheck lactic acid 3.0.  Continue to trend   Morbid obesity: Diet and exercise discussed in detail. Body mass index is 36.18 kg/m.    Glaucoma: Continue home antiglaucoma medications.   Chronic insomnia: Continue trazodone 150 mg at bedtime.   CKD stage IIIa: Serum creatinine at baseline.  Remains between 1.2-1.4. Continue IV hydration.   Generalized weakness: Patient is wheelchair-bound for last 2 years. PT and OT eval.     DVT prophylaxis: Eliquis Code Status: Full code Family Communication: No family at bed side. Disposition Plan:   Status is: Inpatient Remains inpatient appropriate because: Admitted for right index finger abscess and chronic wound, requiring antibiotics.  Patient is scheduled to have right index finger amputation today by hand surgery.   Consultants:  Hand surgery  Procedures: Amputation of right index finger Antimicrobials: Ceftriaxone, Flagyl, vancomycin  Subjective: Patient was seen and examined at bedside.  Overnight events noted.   Patient reports no pain,  states he has neuropathy.   Patient is going to have amputation of right index finger today.  Objective: Vitals:   08/30/21 0538 08/30/21 0813 08/30/21 1000 08/30/21 1048  BP: (!) 158/82 (!) 148/85 (!) 152/81 (!) 149/73  Pulse: 60 63 66 73  Resp: '13 15 16 17  '$ Temp: (!) 97.4 F (36.3 C) 97.8 F (36.6 C) 97.9 F (36.6 C) 98.1 F (36.7 C)  TempSrc: Oral Oral Oral Oral  SpO2: 94% 95% 96% 93%  Weight:    113.4 kg  Height:    '5\' 9"'$  (1.753 m)  Intake/Output Summary (Last 24 hours) at 08/30/2021 1206 Last data filed at 08/30/2021 0558 Gross per 24 hour  Intake 193.71 ml  Output --  Net 193.71 ml   Filed Weights   08/29/21 1413 08/30/21 1048  Weight: 111.1 kg 113.4 kg     Examination:  General exam: Appears comfortable, not in any acute distress, deconditioned Respiratory system: CTA bilaterally, no wheezing, no crackles, normal respiratory effort. Cardiovascular system: S1 & S2 heard, regular rate and rhythm, no murmur.   Gastrointestinal system: Abdomen is soft, nontender, nondistended, BS +. Central nervous system: Alert and oriented x3. No focal neurological deficits. Extremities:  Amputated left index finger, amputated right ring finger, erythematous right index finger with open wound draining greenish pus Skin: No rashes, lesions or ulcers Psychiatry: Judgement and insight appear normal. Mood & affect appropriate.     Data Reviewed: I have personally reviewed following labs and imaging studies  CBC: Recent Labs  Lab 08/29/21 1440 08/30/21 0354  WBC 10.2 9.1  HGB 13.2 13.8  HCT 40.0 42.3  MCV 90.1 91.2  PLT 251 761   Basic Metabolic Panel: Recent Labs  Lab 08/29/21 1440 08/30/21 0354  NA 137 141  K 3.9 4.1  CL 98 102  CO2 26 27  GLUCOSE 259* 152*  BUN 11 11  CREATININE 1.28* 1.10  CALCIUM 10.1 9.7  MG  --  1.9  PHOS  --  3.7   GFR: Estimated Creatinine Clearance: 64.3 mL/min (by C-G formula based on SCr of 1.1 mg/dL). Liver Function Tests: Recent Labs  Lab 08/30/21 0354  AST 25  ALT 18  ALKPHOS 65  BILITOT 0.7  PROT 7.0  ALBUMIN 3.1*   No results for input(s): "LIPASE", "AMYLASE" in the last 168 hours. No results for input(s): "AMMONIA" in the last 168 hours. Coagulation Profile: No results for input(s): "INR", "PROTIME" in the last 168 hours. Cardiac Enzymes: No results for input(s): "CKTOTAL", "CKMB", "CKMBINDEX", "TROPONINI" in the last 168 hours. BNP (last 3 results) No results for input(s): "PROBNP" in the last 8760 hours. HbA1C: Recent Labs    08/29/21 1745  HGBA1C 6.9*   CBG: Recent Labs  Lab 08/30/21 0755 08/30/21 1049  GLUCAP 144* 146*   Lipid Profile: No results for input(s): "CHOL",  "HDL", "LDLCALC", "TRIG", "CHOLHDL", "LDLDIRECT" in the last 72 hours. Thyroid Function Tests: No results for input(s): "TSH", "T4TOTAL", "FREET4", "T3FREE", "THYROIDAB" in the last 72 hours. Anemia Panel: No results for input(s): "VITAMINB12", "FOLATE", "FERRITIN", "TIBC", "IRON", "RETICCTPCT" in the last 72 hours. Sepsis Labs: Recent Labs  Lab 08/29/21 1410 08/29/21 1623  LATICACIDVEN 3.7* 3.0*    No results found for this or any previous visit (from the past 240 hour(s)).   Radiology Studies: No results found.  Scheduled Meds:  acetaminophen       [MAR Hold] apixaban  5 mg Oral BID   [MAR Hold] carvedilol  6.25 mg Oral BID WC   chlorhexidine       [MAR Hold] docusate sodium  100 mg Oral BID   [MAR Hold] DULoxetine  60 mg Oral Daily   [MAR Hold] gabapentin  600 mg Oral BID   [MAR Hold] insulin aspart  0-6 Units Subcutaneous TID WC   [MAR Hold] senna  1 tablet Oral BID   [MAR Hold] traZODone  50 mg Oral QHS   Continuous Infusions:  sodium chloride 75 mL/hr at 08/29/21 1953   [MAR Hold] cefTRIAXone (ROCEPHIN)  IV Stopped (08/29/21 2132)   And   [facility]  Hold] metronidazole Stopped (08/30/21 0558)   lactated ringers 10 mL/hr at 08/30/21 1122   [MAR Hold] vancomycin       LOS: 1 day    Time spent: 35 mins    Corrina Steffensen, MD [facility]   If 7PM-7AM, please contact night-coverage

## 2021-08-30 NOTE — ED Notes (Signed)
Pt in room resting. NAD chest rising and falling. Introduced self to Pt. Updated on plan of care. Pt A&O x3 at baseline. O2 applied for comfort while resting.

## 2021-08-30 NOTE — Progress Notes (Signed)
       CROSS COVER NOTE  NAME: JOWELL BOSSI MRN: [id number] DOB : [birth year]    Date of Service   08/30/21  HPI/Events of Note   Notified by nursing of CBG 257 and no HS insulin ordered  Interventions   Plan: qHS SSI ordered, CBG goal 140-180    This document was prepared using Dragon voice recognition software and may include unintentional dictation errors.  Neomia Glass DNP, MHA, FNP-BC Nurse Practitioner [facility] Pager [phone]

## 2021-08-31 ENCOUNTER — Encounter (HOSPITAL_COMMUNITY): Payer: Self-pay | Admitting: Orthopedic Surgery

## 2021-08-31 DIAGNOSIS — M86141 Other acute osteomyelitis, right hand: Secondary | ICD-10-CM | POA: Diagnosis not present

## 2021-08-31 DIAGNOSIS — N1831 Chronic kidney disease, stage 3a: Secondary | ICD-10-CM | POA: Diagnosis not present

## 2021-08-31 DIAGNOSIS — E669 Obesity, unspecified: Secondary | ICD-10-CM

## 2021-08-31 DIAGNOSIS — E1169 Type 2 diabetes mellitus with other specified complication: Secondary | ICD-10-CM | POA: Diagnosis not present

## 2021-08-31 DIAGNOSIS — F5104 Psychophysiologic insomnia: Secondary | ICD-10-CM

## 2021-08-31 DIAGNOSIS — I10 Essential (primary) hypertension: Secondary | ICD-10-CM

## 2021-08-31 LAB — GLUCOSE, CAPILLARY
Glucose-Capillary: 150 mg/dL — ABNORMAL HIGH (ref 70–99)
Glucose-Capillary: 226 mg/dL — ABNORMAL HIGH (ref 70–99)

## 2021-08-31 MED ORDER — OXYCODONE-ACETAMINOPHEN 5-325 MG PO TABS: 1.0000 | ORAL_TABLET | Freq: Two times a day (BID) | ORAL | 0 refills | Status: DC

## 2021-08-31 MED ORDER — PREDNISOLONE ACETATE 1 % OP SUSP
1.0000 [drp] | OPHTHALMIC | Status: DC
Start: 2021-08-31 — End: 2021-08-31
  Administered 2021-08-31: 1 [drp] via OPHTHALMIC
  Filled 2021-08-31: qty 5

## 2021-08-31 MED ORDER — DOXYCYCLINE HYCLATE 100 MG PO CAPS: 100.0000 mg | ORAL_CAPSULE | Freq: Two times a day (BID) | ORAL | 0 refills | Status: AC

## 2021-08-31 MED ORDER — METFORMIN HCL 500 MG PO TABS: 1000.0000 mg | ORAL_TABLET | Freq: Two times a day (BID) | ORAL | 3 refills | Status: DC

## 2021-08-31 NOTE — Progress Notes (Signed)
IV's removed. DC instructions given and explained. Pt spouse present for DC instructions. All questions answered. Pt and spouse awaiting transport by son

## 2021-08-31 NOTE — Discharge Summary (Signed)
Physician Discharge Summary   Patient: [name] MRN: [id number] DOB: October 03, 1940  Admit date:     08/29/2021  Discharge date: 08/31/21  Discharge Physician: Estill Cotta, MD    PCP: Lavone Orn, MD   Recommendations at discharge:   Placed on doxycycline 100 mg twice daily for 14 days. Outpatient follow-up with Dr. Tempie Donning within a week.  Discharge Diagnoses:    Hand osteomyelitis, right ([place]) status post right index finger amputation   Diabetes mellitus type 2 in obese (HCC)   Glaucoma   PAF (paroxysmal atrial fibrillation) (HCC)   Chronic insomnia   Chronic kidney disease, stage 3a (HCC)   Essential hypertension   HLD (hyperlipidemia)   Obstructive sleep apnea    Hospital Course: Briefly patient is a 81 y.o. male with PMH significant for diabetes mellitus type 2, Essential hypertension, morbid obesity, diabetic neuropathy, peripheral axonal neuropathy, venous stasis dermatitis of lower extremities, obstructive sleep apnea, paroxysmal A-fib on Eliquis, glaucoma, chronic insomnia, presented in the ED with right index finger wound and drainage for 1 week.  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  Assessment and Plan:  Right index finger abscess, osteomyelitis -Presented with right index finger redness, open wound with drainage for a week.  X-ray findings concerning for osteomyelitis. -Patient was placed on IV ceftriaxone Flagyl and vancomycin. -Hand surgery was consulted, patient was seen by Dr. Tempie Donning and underwent right index finger amputation on 8/22 -He is cleared for discharge by Dr. Tempie Donning, discussed in detail, recommended doxycycline, will follow-up in clinic in 1 week.   Diabetes mellitus type 2, uncontrolled with hyperglycemia -HbA1c 6.9, continue metformin,  Trulicity  Essential hypertension Continue Coreg twice daily   Paroxysmal atrial fibrillation -Heart rate controlled, continue Coreg -Continue eliquis  Diabetic neuropathy -Stable, continue Cymbalta, gabapentin   Lactic acidosis -Likely in the setting of osteomyelitis and infection, no other signs of sepsis.  Patient was placed on IV fluid hydration.  Obesity Body mass index is 36.92 kg/m.  -Counseled on diet and weight control  Glaucoma Continue eyedrops  Chronic insomnia Continue trazodone  CKD stage IIIa -Stable, continue monitoring, creatinine remains in the baseline range    Generalized weakness: Patient is wheelchair-bound for last 2 years. PT and OT eval.     Pain control - [place] Controlled Substance Reporting System database was reviewed. and patient was instructed, not to drive, operate heavy machinery, perform activities at heights, swimming or participation in water activities or provide baby-sitting services while on Pain, Sleep and Anxiety Medications; until their outpatient Physician has advised to do so again. Also recommended to not to take more than prescribed Pain, Sleep and Anxiety Medications.  Consultants: Hand surgery Procedures performed: Right index finger amputation Disposition: Home Diet recommendation:  Discharge Diet Orders (From admission, onward)     Start     Ordered   08/31/21 0000  Diet Carb Modified        08/31/21 1019           Carb modified diet DISCHARGE MEDICATION: Allergies as of 08/31/2021       Reactions   Ambien [zolpidem Tartrate] Other (See Comments)   Confusion Memory loss   Aspirin Other (See Comments)   Bleeding ulcer   Cosopt [dorzolamide Hcl-timolol Mal] Other (See Comments)   Red and burning eyes   Diamox [  acetazolamide] Other (See Comments)   Unknown reaction        Medication List     STOP taking these medications    amoxicillin-clavulanate 875-125 MG tablet Commonly known as:  AUGMENTIN   doxycycline 100 MG capsule Commonly known as: MONODOX       TAKE these medications    amLODipine 5 MG tablet Commonly known as: NORVASC Take 5 mg by mouth daily.   apixaban 5 MG Tabs tablet Commonly known as: ELIQUIS Take 5 mg by mouth 2 (two) times daily.   carvedilol 6.25 MG tablet Commonly known as: COREG Take 6.25 mg by mouth 2 (two) times daily with a meal.   Combigan 0.2-0.5 % ophthalmic solution Generic drug: brimonidine-timolol Place 1 drop into the left eye 2 (two) times daily.   doxycycline 100 MG capsule Commonly known as: VIBRAMYCIN Take 1 capsule (100 mg total) by mouth 2 (two) times daily for 14 days.   DULoxetine 60 MG capsule Commonly known as: CYMBALTA Take 60 mg by mouth daily.   furosemide 40 MG tablet Commonly known as: LASIX Take 40 mg by mouth every other day.   gabapentin 600 MG tablet Commonly known as: NEURONTIN Take 600 mg by mouth 2 (two) times daily.   metFORMIN 500 MG tablet Commonly known as: GLUCOPHAGE Take 2 tablets (1,000 mg total) by mouth 2 (two) times daily with a meal.   multivitamin tablet Take 1 tablet by mouth daily.   oxyCODONE-acetaminophen 5-325 MG tablet Commonly known as: PERCOCET/ROXICET Take 1 tablet by mouth in the morning and at bedtime.   potassium chloride SA 20 MEQ tablet Commonly known as: KLOR-CON M Take 20 mEq by mouth every other day.   prednisoLONE acetate 1 % ophthalmic suspension Commonly known as: PRED FORTE Place 1 drop into the right eye every morning.   traZODone 100 MG tablet Commonly known as: DESYREL Take 100 mg by mouth at bedtime.   Trulicity 1.5 MW/1.0UV Sopn Generic drug: Dulaglutide Inject 1.5 mg into the skin every Saturday.               Discharge Care Instructions  (From admission, onward)           Start     Ordered   08/31/21 0000  Leave dressing on - Keep it clean, dry, and intact until clinic visit        08/31/21 1019             Follow-up Information     Sherilyn Cooter, MD. Schedule an appointment as soon as possible for a visit in 1 week(s).   Specialty: Orthopedic Surgery Why: for right hand dressing and follow-up Contact information: [place] [address] [phone]         Lavone Orn, MD. Schedule an appointment as soon as possible for a visit in 2 week(s).   Specialty: Internal Medicine Why: for hospital follow-up Contact information: [address] [place] [address] [phone]                Discharge Exam: Danley Danker Weights   08/29/21 1413 08/30/21 1048  Weight: 111.1 kg 113.4 kg   S: Sitting up in the chair, eating breakfast, wants to go home, no acute issues overnight  Vitals:   08/30/21 1700 08/30/21 2013 08/31/21 0414 08/31/21 0928  BP: 135/80 125/67 (!) 141/76 (!) 146/62  Pulse: 79 87 74 66  Resp: '18 19 17   '$ Temp: 98.1 F (36.7 C) 99.9 F (  37.7 C) 98.6 F (37 C)   TempSrc:  Oral    SpO2: 97% 98% 95%   Weight:      Height:        Physical Exam General: Alert and oriented x 3, NAD, hearing deficit Cardiovascular: S1 S2 clear, RRR.  Respiratory: CTAB, no wheezing, rales or rhonchi Gastrointestinal: Soft, nontender, nondistended, NBS Ext: no pedal edema bilaterally Neuro: no new deficits Skin: Right hand dressing intact Psych: Normal affect and demeanor, alert and oriented x3    Condition at discharge: fair  The results of significant diagnostics from this hospitalization (including imaging, microbiology, ancillary and laboratory) are listed below for reference.   Imaging Studies: DG Hand 2 View Right  Result Date: 08/29/2021 CLINICAL DATA:  Finger infection. Multiple wounds. History of diabetes. EXAM: RIGHT HAND - 2 VIEW COMPARISON:  Right hand radiographs 04/10/2020 FINDINGS: There has been interval ring finger amputation at the level of the mid to distal shaft of the metacarpal. There is new erosion of the distal aspect  of the distal phalanx of the index finger with regional soft tissue swelling. New, milder or rosea is also noted involving the tuft of the distal phalanx of the long finger. No acute fracture or dislocation is identified. 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 Atherosclerotic vascular calcifications are noted. IMPRESSION: New erosion of the distal phalanges of the index and long fingers concerning for osteomyelitis. Electronically Signed   By: Logan Bores M.D.   On: 08/29/2021 09:46    Microbiology: Results for orders placed or performed during the hospital encounter of 02/25/21  Urine Culture     Status: None   Collection Time: 02/25/21 12:13 PM   Specimen: Urine, Clean Catch  Result Value Ref Range Status   Specimen Description URINE, CLEAN CATCH  Final   Special Requests NONE  Final   Culture   Final    NO GROWTH Performed at [facility], [place] [address] [place], [place] [address]    Report Status 02/27/2021 FINAL  Final  Resp Panel by RT-PCR (Flu A&B, Covid) Nasopharyngeal Swab     Status: None   Collection Time: 02/25/21 12:13 PM   Specimen: Nasopharyngeal Swab; Nasopharyngeal(NP) swabs in vial transport medium  Result Value Ref Range Status   SARS Coronavirus 2 by RT PCR NEGATIVE NEGATIVE Final    Comment: (NOTE) SARS-CoV-2 target nucleic acids are NOT DETECTED.  The SARS-CoV-2 RNA is generally detectable in upper respiratory specimens during the acute phase of infection. The lowest concentration of SARS-CoV-2 viral copies this assay can detect is 138 copies/mL. A negative result does not preclude SARS-Cov-2 infection and should not be used as the sole basis for treatment or other patient management decisions. A negative result may occur with  improper specimen collection/handling, submission of specimen other than nasopharyngeal swab, presence of viral mutation(s) within the areas targeted by this  assay, and inadequate number of viral copies(<138 copies/mL). A negative result must be combined with clinical observations, patient history, and epidemiological information. The expected result is Negative.  Fact Sheet for Patients:  EntrepreneurPulse.com.au  Fact Sheet for Healthcare Providers:  IncredibleEmployment.be  This test is no t yet approved or cleared by the [place] FDA and  has been authorized for detection and/or diagnosis of SARS-CoV-2 by FDA under an Emergency Use Authorization (EUA). This EUA will remain  in effect (meaning this test can be used) for the duration of  the COVID-19 declaration under Section 564(b)(1) of the Act, 21 U.S.C.section 360bbb-3(b)(1), unless the authorization is terminated  or revoked sooner.       Influenza A by PCR NEGATIVE NEGATIVE Final   Influenza B by PCR NEGATIVE NEGATIVE Final    Comment: (NOTE) The Xpert Xpress SARS-CoV-2/FLU/RSV plus assay is intended as an aid in the diagnosis of influenza from Nasopharyngeal swab specimens and should not be used as a sole basis for treatment. Nasal washings and aspirates are unacceptable for Xpert Xpress SARS-CoV-2/FLU/RSV testing.  Fact Sheet for Patients: EntrepreneurPulse.com.au  Fact Sheet for Healthcare Providers: IncredibleEmployment.be  This test is not yet approved or cleared by the [place] FDA and has been authorized for detection and/or diagnosis of SARS-CoV-2 by FDA under an Emergency Use Authorization (EUA). This EUA will remain in effect (meaning this test can be used) for the duration of the COVID-19 declaration under Section 564(b)(1) of the Act, 21 U.S.C. section 360bbb-3(b)(1), unless the authorization is terminated or revoked.  Performed at [facility], [place] [address], [place], [place] [address]   Culture, blood (routine x 2)     Status: None   Collection Time: 02/25/21  8:30 PM    Specimen: BLOOD RIGHT HAND  Result Value Ref Range Status   Specimen Description BLOOD RIGHT HAND  Final   Special Requests   Final    AEROBIC BOTTLE ONLY Blood Culture results may not be optimal due to an inadequate volume of blood received in culture bottles   Culture   Final    NO GROWTH 5 DAYS Performed at [facility], [place] [address], [place], [place] [address]    Report Status 03/02/2021 FINAL  Final  Culture, blood (routine x 2)     Status: None   Collection Time: 02/25/21  8:34 PM   Specimen: BLOOD  Result Value Ref Range Status   Specimen Description BLOOD LEFT ANTECUBITAL  Final   Special Requests   Final    AEROBIC BOTTLE ONLY Blood Culture results may not be optimal due to an inadequate volume of blood received in culture bottles   Culture   Final    NO GROWTH 5 DAYS Performed at [facility], [place] [address] [place], [place] [address]    Report Status 03/02/2021 FINAL  Final    Labs: CBC: Recent Labs  Lab 08/29/21 1440 08/30/21 0354  WBC 10.2 9.1  HGB 13.2 13.8  HCT 40.0 42.3  MCV 90.1 91.2  PLT 251 702   Basic Metabolic Panel: Recent Labs  Lab 08/29/21 1440 08/30/21 0354  NA 137 141  K 3.9 4.1  CL 98 102  CO2 26 27  GLUCOSE 259* 152*  BUN 11 11  CREATININE 1.28* 1.10  CALCIUM 10.1 9.7  MG  --  1.9  PHOS  --  3.7   Liver Function Tests: Recent Labs  Lab 08/30/21 0354  AST 25  ALT 18  ALKPHOS 65  BILITOT 0.7  PROT 7.0  ALBUMIN 3.1*   CBG: Recent Labs  Lab 08/30/21 1251 08/30/21 1646 08/30/21 2012 08/31/21 0808 08/31/21 1154  GLUCAP 136* 239* 257* 150* 226*    Discharge time spent: greater than 30 minutes.  Signed: Estill Cotta, MD [facility] 08/31/2021

## 2021-09-01 DIAGNOSIS — I480 Paroxysmal atrial fibrillation: Secondary | ICD-10-CM | POA: Diagnosis not present

## 2021-09-01 DIAGNOSIS — Z7985 Long-term (current) use of injectable non-insulin antidiabetic drugs: Secondary | ICD-10-CM | POA: Diagnosis not present

## 2021-09-01 DIAGNOSIS — S81801D Unspecified open wound, right lower leg, subsequent encounter: Secondary | ICD-10-CM | POA: Diagnosis not present

## 2021-09-01 DIAGNOSIS — I129 Hypertensive chronic kidney disease with stage 1 through stage 4 chronic kidney disease, or unspecified chronic kidney disease: Secondary | ICD-10-CM | POA: Diagnosis not present

## 2021-09-01 DIAGNOSIS — N1832 Chronic kidney disease, stage 3b: Secondary | ICD-10-CM | POA: Diagnosis not present

## 2021-09-01 DIAGNOSIS — E1140 Type 2 diabetes mellitus with diabetic neuropathy, unspecified: Secondary | ICD-10-CM | POA: Diagnosis not present

## 2021-09-01 DIAGNOSIS — E1122 Type 2 diabetes mellitus with diabetic chronic kidney disease: Secondary | ICD-10-CM | POA: Diagnosis not present

## 2021-09-01 DIAGNOSIS — R531 Weakness: Secondary | ICD-10-CM | POA: Diagnosis not present

## 2021-09-01 DIAGNOSIS — Z7901 Long term (current) use of anticoagulants: Secondary | ICD-10-CM | POA: Diagnosis not present

## 2021-09-01 DIAGNOSIS — Z7984 Long term (current) use of oral hypoglycemic drugs: Secondary | ICD-10-CM | POA: Diagnosis not present

## 2021-09-06 ENCOUNTER — Ambulatory Visit (INDEPENDENT_AMBULATORY_CARE_PROVIDER_SITE_OTHER): Payer: Medicare Other | Admitting: Orthopedic Surgery

## 2021-09-06 DIAGNOSIS — M86141 Other acute osteomyelitis, right hand: Secondary | ICD-10-CM

## 2021-09-06 NOTE — Progress Notes (Signed)
Post-Op Visit Note   Patient: [name]           Date of Birth: Sep 22, 1940           MRN: [id number] Visit Date: 09/06/2021 PCP: Lavone Orn, MD   Assessment & Plan:  Chief Complaint:  Chief Complaint  Patient presents with   Right Index Finger - Routine Post Op   Visit Diagnoses:  1. Other acute osteomyelitis of right hand ([place])     Plan: Patient is one week s/p right index finger amputation through the proximal phalanx for acute on chronic infection with osteomyelitis.  The incision is clean, dry, and well approximated without surrounding erythema or drainage.  The wound was cleaned and redressed.  I can see him back next week for another wound check.   Follow-Up Instructions: No follow-ups on file.   Orders:  No orders of the defined types were placed in this encounter.  No orders of the defined types were placed in this encounter.   Imaging: No results found.  PMFS History: Patient Active Problem List   Diagnosis Date Noted   Hand osteomyelitis, right ([place]) 08/29/2021   Chronic insomnia 02/25/2021   Chronic kidney disease, stage 3a ([place]) 02/25/2021   HLD (hyperlipidemia) 02/25/2021   Obstructive sleep apnea 02/25/2021   Encephalopathy acute 02/25/2021   Leukocytosis 02/25/2021   Infection of right hand 04/10/2020   Acute osteomyelitis of hand including fingers, right ([place]) 04/10/2020   PAF (paroxysmal atrial fibrillation) ([place]) 04/10/2020   Weakness 01/06/2020   Orthostatic hypotension 01/06/2020   Diabetes mellitus type 2 in obese ([place]) 01/06/2020   Glaucoma 01/06/2020   Tachycardia 07/10/2019   Essential hypertension 08/01/2013   Past Medical History:  Diagnosis Date   Adenomatous colon polyp    Allergic rhinitis    BMI 40.0-44.9, adult (HCC)    Chronic insomnia    Chronic low back pain    Constipation, unspecified    Diabetes mellitus ([place]) 2015   DJD (degenerative joint disease) of knee    Hypercalcemia    Hypertension     Hypertriglyceridemia    Junctional tachycardia (HCC)    Lumbar facet arthropathy    Morbid obesity (HCC)    Neuropathy    Nonallergic rhinitis    OSA (obstructive sleep apnea)    Other chronic pain    Peripheral axonal neuropathy    Upper GI bleed    due to aspirin   Venous insufficiency    Venous stasis dermatitis of both lower extremities     Family History  Problem Relation Age of Onset   Gout Father    Osteoarthritis Mother        Deceased, 52   Healthy Brother        x2   Healthy Son        x2    Past Surgical History:  Procedure Laterality Date   AMPUTATION Right 08/30/2021   Procedure: Right index finger Amputaion;  Surgeon: Sherilyn Cooter, MD;  Location: [facility];  Service: Orthopedics;  Laterality: Right;   cataract     Bilateral   GLAUCOMA SURGERY     HAND SURGERY Left    s/p amputation of left finger   I & D EXTREMITY Right 04/10/2020   Procedure: IRRIGATION AND DEBRIDEMENT AND AMPUTATION OF RIGHT RING FINGER;  Surgeon: Iran Planas, MD;  Location: [facility];  Service: Orthopedics;  Laterality: Right;   Social History   Occupational History   Not on file  Tobacco  Use   Smoking status: Former   Smokeless tobacco: Never   Tobacco comments:    Quit 20 years ago.    Substance and Sexual Activity   Alcohol use: No   Drug use: No   Sexual activity: Not on file

## 2021-09-07 DIAGNOSIS — S81801D Unspecified open wound, right lower leg, subsequent encounter: Secondary | ICD-10-CM | POA: Diagnosis not present

## 2021-09-07 DIAGNOSIS — Z7984 Long term (current) use of oral hypoglycemic drugs: Secondary | ICD-10-CM | POA: Diagnosis not present

## 2021-09-07 DIAGNOSIS — E1122 Type 2 diabetes mellitus with diabetic chronic kidney disease: Secondary | ICD-10-CM | POA: Diagnosis not present

## 2021-09-07 DIAGNOSIS — Z4781 Encounter for orthopedic aftercare following surgical amputation: Secondary | ICD-10-CM | POA: Diagnosis not present

## 2021-09-07 DIAGNOSIS — Z7985 Long-term (current) use of injectable non-insulin antidiabetic drugs: Secondary | ICD-10-CM | POA: Diagnosis not present

## 2021-09-07 DIAGNOSIS — S61011D Laceration without foreign body of right thumb without damage to nail, subsequent encounter: Secondary | ICD-10-CM | POA: Diagnosis not present

## 2021-09-07 DIAGNOSIS — E1140 Type 2 diabetes mellitus with diabetic neuropathy, unspecified: Secondary | ICD-10-CM | POA: Diagnosis not present

## 2021-09-07 DIAGNOSIS — Z89021 Acquired absence of right finger(s): Secondary | ICD-10-CM | POA: Diagnosis not present

## 2021-09-07 DIAGNOSIS — I129 Hypertensive chronic kidney disease with stage 1 through stage 4 chronic kidney disease, or unspecified chronic kidney disease: Secondary | ICD-10-CM | POA: Diagnosis not present

## 2021-09-07 DIAGNOSIS — N1832 Chronic kidney disease, stage 3b: Secondary | ICD-10-CM | POA: Diagnosis not present

## 2021-09-15 DIAGNOSIS — N1832 Chronic kidney disease, stage 3b: Secondary | ICD-10-CM | POA: Diagnosis not present

## 2021-09-15 DIAGNOSIS — Z7985 Long-term (current) use of injectable non-insulin antidiabetic drugs: Secondary | ICD-10-CM | POA: Diagnosis not present

## 2021-09-15 DIAGNOSIS — S61011D Laceration without foreign body of right thumb without damage to nail, subsequent encounter: Secondary | ICD-10-CM | POA: Diagnosis not present

## 2021-09-15 DIAGNOSIS — Z89021 Acquired absence of right finger(s): Secondary | ICD-10-CM | POA: Diagnosis not present

## 2021-09-15 DIAGNOSIS — S81801D Unspecified open wound, right lower leg, subsequent encounter: Secondary | ICD-10-CM | POA: Diagnosis not present

## 2021-09-15 DIAGNOSIS — E1122 Type 2 diabetes mellitus with diabetic chronic kidney disease: Secondary | ICD-10-CM | POA: Diagnosis not present

## 2021-09-15 DIAGNOSIS — I129 Hypertensive chronic kidney disease with stage 1 through stage 4 chronic kidney disease, or unspecified chronic kidney disease: Secondary | ICD-10-CM | POA: Diagnosis not present

## 2021-09-15 DIAGNOSIS — Z7984 Long term (current) use of oral hypoglycemic drugs: Secondary | ICD-10-CM | POA: Diagnosis not present

## 2021-09-15 DIAGNOSIS — Z4781 Encounter for orthopedic aftercare following surgical amputation: Secondary | ICD-10-CM | POA: Diagnosis not present

## 2021-09-15 DIAGNOSIS — E1140 Type 2 diabetes mellitus with diabetic neuropathy, unspecified: Secondary | ICD-10-CM | POA: Diagnosis not present

## 2021-09-21 DIAGNOSIS — H18222 Idiopathic corneal edema, left eye: Secondary | ICD-10-CM | POA: Diagnosis not present

## 2021-09-21 DIAGNOSIS — E119 Type 2 diabetes mellitus without complications: Secondary | ICD-10-CM | POA: Diagnosis not present

## 2021-09-21 DIAGNOSIS — Z961 Presence of intraocular lens: Secondary | ICD-10-CM | POA: Diagnosis not present

## 2021-09-21 DIAGNOSIS — H401113 Primary open-angle glaucoma, right eye, severe stage: Secondary | ICD-10-CM | POA: Diagnosis not present

## 2021-09-23 DIAGNOSIS — Z89021 Acquired absence of right finger(s): Secondary | ICD-10-CM | POA: Diagnosis not present

## 2021-09-23 DIAGNOSIS — E1122 Type 2 diabetes mellitus with diabetic chronic kidney disease: Secondary | ICD-10-CM | POA: Diagnosis not present

## 2021-09-23 DIAGNOSIS — S61011D Laceration without foreign body of right thumb without damage to nail, subsequent encounter: Secondary | ICD-10-CM | POA: Diagnosis not present

## 2021-09-23 DIAGNOSIS — S81801D Unspecified open wound, right lower leg, subsequent encounter: Secondary | ICD-10-CM | POA: Diagnosis not present

## 2021-09-23 DIAGNOSIS — Z7984 Long term (current) use of oral hypoglycemic drugs: Secondary | ICD-10-CM | POA: Diagnosis not present

## 2021-09-23 DIAGNOSIS — Z4781 Encounter for orthopedic aftercare following surgical amputation: Secondary | ICD-10-CM | POA: Diagnosis not present

## 2021-09-23 DIAGNOSIS — N1832 Chronic kidney disease, stage 3b: Secondary | ICD-10-CM | POA: Diagnosis not present

## 2021-09-23 DIAGNOSIS — I129 Hypertensive chronic kidney disease with stage 1 through stage 4 chronic kidney disease, or unspecified chronic kidney disease: Secondary | ICD-10-CM | POA: Diagnosis not present

## 2021-09-23 DIAGNOSIS — E1140 Type 2 diabetes mellitus with diabetic neuropathy, unspecified: Secondary | ICD-10-CM | POA: Diagnosis not present

## 2021-09-23 DIAGNOSIS — Z7985 Long-term (current) use of injectable non-insulin antidiabetic drugs: Secondary | ICD-10-CM | POA: Diagnosis not present

## 2021-09-23 NOTE — Op Note (Signed)
   Date of Surgery: 09/23/2021  INDICATIONS: Patient is a 81 y.o.-year-old male with a right infection with underlying osteomyelitis.  Risks, benefits, and alternatives to surgery were again discussed with the patient in the preoperative area. The patient wishes to proceed with surgery.  Informed consent was signed after our discussion.   PREOPERATIVE DIAGNOSIS:  Right index finger infection with osteomyelitis  POSTOPERATIVE DIAGNOSIS: Same.  PROCEDURE:  Amputation of right index finger through proximal phalanx   SURGEON: Audria Nine, M.D.  ASSIST:   ANESTHESIA:  general  IV FLUIDS AND URINE: See anesthesia.  ESTIMATED BLOOD LOSS: <5 mL.  IMPLANTS: * No implants in log *   DRAINS: None  COMPLICATIONS: None  DESCRIPTION OF PROCEDURE: The patient was met in the preoperative holding area where the surgical site was marked and the consent form was verified.  The patient was then taken to the operating room and transferred to the operating table.  All bony prominences were well padded.  General endotracheal anesthesia was induced.  The operative extremity was prepped and draped in the usual and sterile fashion.  A formal time-out was performed to confirm that this was the correct patient, surgery, side, and site.    Following a formal timeout, a finger tourniquet was applied.  There was obvious infection of the index finger from around the level of the PIP joint to the fingertip.  The skin of the middle phalanx was very swollen and erythematous.  I therefore decided to amputate the finger to the level of the PIP joint and head of proximal phalanx to maximize the chances of wound healing given patient's diabetes and history of multiple infections..  [place] incision was designed at the level of the amputation.  The skin was sharply incised.  The extensor apparatus was sharply incised.  The incision was carried out to the volar aspect of the finger and the FDS and FDP tendons were  divided.  The finger was disarticulated at the PIP joint.  A rongeur was used to shorten the proximal phalanx to allow tension-free closure of the skin flaps.  Tenotomy scissors were used to identify the radial and ulnar digital neurovascular bundles.  A traction neurectomy was performed on both the radial and ulnar digital nerve to prevent painful neuroma at the site of the incision.  Tourniquet was removed.  Hemostasis was achieved with a combination of bipolar electrocautery and direct pressure over the wound.  The skin was then closed using a 4-0 Vicryl Rapide suture in a simple fashion.  There was no tension on the skin flaps.  Following wound closure, the wound was dressed with Xeroform, bacitracin ointment, 4 x 4, Kling wrap, and an Ace wrap.  The patient was reversed anesthesia Uneventfully.  Transferred to the postoperative bed.  Taken to PACU in stable condition.  All counts were correct at the end of the procedure.  POSTOPERATIVE PLAN: Patient will be returned to his room on the floor.  He should be discharged on oral antibiotics.  I can see him back in the office in 1 week after discharge for wound check.  Audria Nine, MD 9:50 AM

## 2021-09-27 DIAGNOSIS — Z4781 Encounter for orthopedic aftercare following surgical amputation: Secondary | ICD-10-CM | POA: Diagnosis not present

## 2021-09-27 DIAGNOSIS — N1832 Chronic kidney disease, stage 3b: Secondary | ICD-10-CM | POA: Diagnosis not present

## 2021-09-27 DIAGNOSIS — Z89021 Acquired absence of right finger(s): Secondary | ICD-10-CM | POA: Diagnosis not present

## 2021-09-27 DIAGNOSIS — I480 Paroxysmal atrial fibrillation: Secondary | ICD-10-CM | POA: Diagnosis not present

## 2021-09-27 DIAGNOSIS — S81801D Unspecified open wound, right lower leg, subsequent encounter: Secondary | ICD-10-CM | POA: Diagnosis not present

## 2021-09-27 DIAGNOSIS — G8929 Other chronic pain: Secondary | ICD-10-CM | POA: Diagnosis not present

## 2021-09-27 DIAGNOSIS — E781 Pure hyperglyceridemia: Secondary | ICD-10-CM | POA: Diagnosis not present

## 2021-09-27 DIAGNOSIS — I129 Hypertensive chronic kidney disease with stage 1 through stage 4 chronic kidney disease, or unspecified chronic kidney disease: Secondary | ICD-10-CM | POA: Diagnosis not present

## 2021-09-27 DIAGNOSIS — E1122 Type 2 diabetes mellitus with diabetic chronic kidney disease: Secondary | ICD-10-CM | POA: Diagnosis not present

## 2021-09-27 DIAGNOSIS — Z7985 Long-term (current) use of injectable non-insulin antidiabetic drugs: Secondary | ICD-10-CM | POA: Diagnosis not present

## 2021-09-27 DIAGNOSIS — S61011D Laceration without foreign body of right thumb without damage to nail, subsequent encounter: Secondary | ICD-10-CM | POA: Diagnosis not present

## 2021-09-27 DIAGNOSIS — I10 Essential (primary) hypertension: Secondary | ICD-10-CM | POA: Diagnosis not present

## 2021-09-27 DIAGNOSIS — Z7984 Long term (current) use of oral hypoglycemic drugs: Secondary | ICD-10-CM | POA: Diagnosis not present

## 2021-09-27 DIAGNOSIS — E1140 Type 2 diabetes mellitus with diabetic neuropathy, unspecified: Secondary | ICD-10-CM | POA: Diagnosis not present

## 2021-10-05 DIAGNOSIS — N1832 Chronic kidney disease, stage 3b: Secondary | ICD-10-CM | POA: Diagnosis not present

## 2021-10-05 DIAGNOSIS — Z89021 Acquired absence of right finger(s): Secondary | ICD-10-CM | POA: Diagnosis not present

## 2021-10-05 DIAGNOSIS — E1140 Type 2 diabetes mellitus with diabetic neuropathy, unspecified: Secondary | ICD-10-CM | POA: Diagnosis not present

## 2021-10-05 DIAGNOSIS — S81801D Unspecified open wound, right lower leg, subsequent encounter: Secondary | ICD-10-CM | POA: Diagnosis not present

## 2021-10-05 DIAGNOSIS — Z7984 Long term (current) use of oral hypoglycemic drugs: Secondary | ICD-10-CM | POA: Diagnosis not present

## 2021-10-05 DIAGNOSIS — E1122 Type 2 diabetes mellitus with diabetic chronic kidney disease: Secondary | ICD-10-CM | POA: Diagnosis not present

## 2021-10-05 DIAGNOSIS — I129 Hypertensive chronic kidney disease with stage 1 through stage 4 chronic kidney disease, or unspecified chronic kidney disease: Secondary | ICD-10-CM | POA: Diagnosis not present

## 2021-10-05 DIAGNOSIS — S61011D Laceration without foreign body of right thumb without damage to nail, subsequent encounter: Secondary | ICD-10-CM | POA: Diagnosis not present

## 2021-10-05 DIAGNOSIS — Z4781 Encounter for orthopedic aftercare following surgical amputation: Secondary | ICD-10-CM | POA: Diagnosis not present

## 2021-10-05 DIAGNOSIS — Z7985 Long-term (current) use of injectable non-insulin antidiabetic drugs: Secondary | ICD-10-CM | POA: Diagnosis not present

## 2021-10-13 DIAGNOSIS — Z7985 Long-term (current) use of injectable non-insulin antidiabetic drugs: Secondary | ICD-10-CM | POA: Diagnosis not present

## 2021-10-13 DIAGNOSIS — Z7984 Long term (current) use of oral hypoglycemic drugs: Secondary | ICD-10-CM | POA: Diagnosis not present

## 2021-10-13 DIAGNOSIS — I129 Hypertensive chronic kidney disease with stage 1 through stage 4 chronic kidney disease, or unspecified chronic kidney disease: Secondary | ICD-10-CM | POA: Diagnosis not present

## 2021-10-13 DIAGNOSIS — E1122 Type 2 diabetes mellitus with diabetic chronic kidney disease: Secondary | ICD-10-CM | POA: Diagnosis not present

## 2021-10-13 DIAGNOSIS — S61011D Laceration without foreign body of right thumb without damage to nail, subsequent encounter: Secondary | ICD-10-CM | POA: Diagnosis not present

## 2021-10-13 DIAGNOSIS — N1832 Chronic kidney disease, stage 3b: Secondary | ICD-10-CM | POA: Diagnosis not present

## 2021-10-13 DIAGNOSIS — E1140 Type 2 diabetes mellitus with diabetic neuropathy, unspecified: Secondary | ICD-10-CM | POA: Diagnosis not present

## 2021-10-13 DIAGNOSIS — Z4781 Encounter for orthopedic aftercare following surgical amputation: Secondary | ICD-10-CM | POA: Diagnosis not present

## 2021-10-13 DIAGNOSIS — Z89021 Acquired absence of right finger(s): Secondary | ICD-10-CM | POA: Diagnosis not present

## 2021-10-13 DIAGNOSIS — S81801D Unspecified open wound, right lower leg, subsequent encounter: Secondary | ICD-10-CM | POA: Diagnosis not present

## 2021-10-20 DIAGNOSIS — Z961 Presence of intraocular lens: Secondary | ICD-10-CM | POA: Diagnosis not present

## 2021-10-20 DIAGNOSIS — H401113 Primary open-angle glaucoma, right eye, severe stage: Secondary | ICD-10-CM | POA: Diagnosis not present

## 2021-10-20 DIAGNOSIS — H18222 Idiopathic corneal edema, left eye: Secondary | ICD-10-CM | POA: Diagnosis not present

## 2021-10-27 DIAGNOSIS — S81801D Unspecified open wound, right lower leg, subsequent encounter: Secondary | ICD-10-CM | POA: Diagnosis not present

## 2021-10-27 DIAGNOSIS — I129 Hypertensive chronic kidney disease with stage 1 through stage 4 chronic kidney disease, or unspecified chronic kidney disease: Secondary | ICD-10-CM | POA: Diagnosis not present

## 2021-10-27 DIAGNOSIS — S61011D Laceration without foreign body of right thumb without damage to nail, subsequent encounter: Secondary | ICD-10-CM | POA: Diagnosis not present

## 2021-10-27 DIAGNOSIS — Z7984 Long term (current) use of oral hypoglycemic drugs: Secondary | ICD-10-CM | POA: Diagnosis not present

## 2021-10-27 DIAGNOSIS — Z89021 Acquired absence of right finger(s): Secondary | ICD-10-CM | POA: Diagnosis not present

## 2021-10-27 DIAGNOSIS — E1122 Type 2 diabetes mellitus with diabetic chronic kidney disease: Secondary | ICD-10-CM | POA: Diagnosis not present

## 2021-10-27 DIAGNOSIS — E1140 Type 2 diabetes mellitus with diabetic neuropathy, unspecified: Secondary | ICD-10-CM | POA: Diagnosis not present

## 2021-10-27 DIAGNOSIS — N1832 Chronic kidney disease, stage 3b: Secondary | ICD-10-CM | POA: Diagnosis not present

## 2021-10-27 DIAGNOSIS — Z7985 Long-term (current) use of injectable non-insulin antidiabetic drugs: Secondary | ICD-10-CM | POA: Diagnosis not present

## 2021-10-27 DIAGNOSIS — Z4781 Encounter for orthopedic aftercare following surgical amputation: Secondary | ICD-10-CM | POA: Diagnosis not present

## 2021-10-28 DIAGNOSIS — I129 Hypertensive chronic kidney disease with stage 1 through stage 4 chronic kidney disease, or unspecified chronic kidney disease: Secondary | ICD-10-CM | POA: Diagnosis not present

## 2021-10-28 DIAGNOSIS — I480 Paroxysmal atrial fibrillation: Secondary | ICD-10-CM | POA: Diagnosis not present

## 2021-10-28 DIAGNOSIS — D6869 Other thrombophilia: Secondary | ICD-10-CM | POA: Diagnosis not present

## 2021-10-28 DIAGNOSIS — G4733 Obstructive sleep apnea (adult) (pediatric): Secondary | ICD-10-CM | POA: Diagnosis not present

## 2021-10-28 DIAGNOSIS — Z23 Encounter for immunization: Secondary | ICD-10-CM | POA: Diagnosis not present

## 2021-10-28 DIAGNOSIS — E781 Pure hyperglyceridemia: Secondary | ICD-10-CM | POA: Diagnosis not present

## 2021-10-28 DIAGNOSIS — I5032 Chronic diastolic (congestive) heart failure: Secondary | ICD-10-CM | POA: Diagnosis not present

## 2021-10-28 DIAGNOSIS — Z0000 Encounter for general adult medical examination without abnormal findings: Secondary | ICD-10-CM | POA: Diagnosis not present

## 2021-10-28 DIAGNOSIS — M869 Osteomyelitis, unspecified: Secondary | ICD-10-CM | POA: Diagnosis not present

## 2021-10-28 DIAGNOSIS — E1122 Type 2 diabetes mellitus with diabetic chronic kidney disease: Secondary | ICD-10-CM | POA: Diagnosis not present

## 2021-10-28 DIAGNOSIS — N1831 Chronic kidney disease, stage 3a: Secondary | ICD-10-CM | POA: Diagnosis not present

## 2021-10-28 DIAGNOSIS — M62838 Other muscle spasm: Secondary | ICD-10-CM | POA: Diagnosis not present

## 2021-10-31 DIAGNOSIS — S81801D Unspecified open wound, right lower leg, subsequent encounter: Secondary | ICD-10-CM | POA: Diagnosis not present

## 2021-10-31 DIAGNOSIS — Z89021 Acquired absence of right finger(s): Secondary | ICD-10-CM | POA: Diagnosis not present

## 2021-10-31 DIAGNOSIS — N1832 Chronic kidney disease, stage 3b: Secondary | ICD-10-CM | POA: Diagnosis not present

## 2021-10-31 DIAGNOSIS — E1122 Type 2 diabetes mellitus with diabetic chronic kidney disease: Secondary | ICD-10-CM | POA: Diagnosis not present

## 2021-10-31 DIAGNOSIS — S61011D Laceration without foreign body of right thumb without damage to nail, subsequent encounter: Secondary | ICD-10-CM | POA: Diagnosis not present

## 2021-10-31 DIAGNOSIS — Z4781 Encounter for orthopedic aftercare following surgical amputation: Secondary | ICD-10-CM | POA: Diagnosis not present

## 2021-10-31 DIAGNOSIS — E1140 Type 2 diabetes mellitus with diabetic neuropathy, unspecified: Secondary | ICD-10-CM | POA: Diagnosis not present

## 2021-10-31 DIAGNOSIS — Z7985 Long-term (current) use of injectable non-insulin antidiabetic drugs: Secondary | ICD-10-CM | POA: Diagnosis not present

## 2021-10-31 DIAGNOSIS — I129 Hypertensive chronic kidney disease with stage 1 through stage 4 chronic kidney disease, or unspecified chronic kidney disease: Secondary | ICD-10-CM | POA: Diagnosis not present

## 2021-10-31 DIAGNOSIS — Z7984 Long term (current) use of oral hypoglycemic drugs: Secondary | ICD-10-CM | POA: Diagnosis not present

## 2021-11-08 DIAGNOSIS — L03012 Cellulitis of left finger: Secondary | ICD-10-CM | POA: Diagnosis not present

## 2021-11-08 DIAGNOSIS — L03011 Cellulitis of right finger: Secondary | ICD-10-CM | POA: Diagnosis not present

## 2021-11-11 DIAGNOSIS — L03011 Cellulitis of right finger: Secondary | ICD-10-CM | POA: Diagnosis not present

## 2021-11-11 DIAGNOSIS — L03012 Cellulitis of left finger: Secondary | ICD-10-CM | POA: Diagnosis not present

## 2021-11-17 DIAGNOSIS — L03012 Cellulitis of left finger: Secondary | ICD-10-CM | POA: Diagnosis not present

## 2021-11-17 DIAGNOSIS — L03011 Cellulitis of right finger: Secondary | ICD-10-CM | POA: Diagnosis not present

## 2021-11-17 DIAGNOSIS — R238 Other skin changes: Secondary | ICD-10-CM | POA: Diagnosis not present

## 2021-11-29 DIAGNOSIS — I10 Essential (primary) hypertension: Secondary | ICD-10-CM | POA: Diagnosis not present

## 2021-11-29 DIAGNOSIS — N1831 Chronic kidney disease, stage 3a: Secondary | ICD-10-CM | POA: Diagnosis not present

## 2021-11-29 DIAGNOSIS — G8929 Other chronic pain: Secondary | ICD-10-CM | POA: Diagnosis not present

## 2021-11-29 DIAGNOSIS — M179 Osteoarthritis of knee, unspecified: Secondary | ICD-10-CM | POA: Diagnosis not present

## 2021-11-29 DIAGNOSIS — E781 Pure hyperglyceridemia: Secondary | ICD-10-CM | POA: Diagnosis not present

## 2021-11-29 DIAGNOSIS — E1122 Type 2 diabetes mellitus with diabetic chronic kidney disease: Secondary | ICD-10-CM | POA: Diagnosis not present

## 2021-11-29 DIAGNOSIS — I129 Hypertensive chronic kidney disease with stage 1 through stage 4 chronic kidney disease, or unspecified chronic kidney disease: Secondary | ICD-10-CM | POA: Diagnosis not present

## 2022-01-24 DIAGNOSIS — E1122 Type 2 diabetes mellitus with diabetic chronic kidney disease: Secondary | ICD-10-CM | POA: Diagnosis not present

## 2022-01-24 DIAGNOSIS — I10 Essential (primary) hypertension: Secondary | ICD-10-CM | POA: Diagnosis not present

## 2022-01-24 DIAGNOSIS — I129 Hypertensive chronic kidney disease with stage 1 through stage 4 chronic kidney disease, or unspecified chronic kidney disease: Secondary | ICD-10-CM | POA: Diagnosis not present

## 2022-01-24 DIAGNOSIS — N1831 Chronic kidney disease, stage 3a: Secondary | ICD-10-CM | POA: Diagnosis not present

## 2022-01-24 DIAGNOSIS — G8929 Other chronic pain: Secondary | ICD-10-CM | POA: Diagnosis not present

## 2022-01-24 DIAGNOSIS — E781 Pure hyperglyceridemia: Secondary | ICD-10-CM | POA: Diagnosis not present

## 2022-01-31 DIAGNOSIS — Z961 Presence of intraocular lens: Secondary | ICD-10-CM | POA: Diagnosis not present

## 2022-01-31 DIAGNOSIS — H401113 Primary open-angle glaucoma, right eye, severe stage: Secondary | ICD-10-CM | POA: Diagnosis not present

## 2022-01-31 DIAGNOSIS — H18222 Idiopathic corneal edema, left eye: Secondary | ICD-10-CM | POA: Diagnosis not present

## 2022-03-07 DIAGNOSIS — M4696 Unspecified inflammatory spondylopathy, lumbar region: Secondary | ICD-10-CM | POA: Diagnosis not present

## 2022-03-07 DIAGNOSIS — E876 Hypokalemia: Secondary | ICD-10-CM | POA: Diagnosis not present

## 2022-03-07 DIAGNOSIS — I130 Hypertensive heart and chronic kidney disease with heart failure and stage 1 through stage 4 chronic kidney disease, or unspecified chronic kidney disease: Secondary | ICD-10-CM | POA: Diagnosis not present

## 2022-03-07 DIAGNOSIS — N1832 Chronic kidney disease, stage 3b: Secondary | ICD-10-CM | POA: Diagnosis not present

## 2022-03-07 DIAGNOSIS — I480 Paroxysmal atrial fibrillation: Secondary | ICD-10-CM | POA: Diagnosis not present

## 2022-03-07 DIAGNOSIS — I5032 Chronic diastolic (congestive) heart failure: Secondary | ICD-10-CM | POA: Diagnosis not present

## 2022-03-07 DIAGNOSIS — I129 Hypertensive chronic kidney disease with stage 1 through stage 4 chronic kidney disease, or unspecified chronic kidney disease: Secondary | ICD-10-CM | POA: Diagnosis not present

## 2022-03-07 DIAGNOSIS — E1122 Type 2 diabetes mellitus with diabetic chronic kidney disease: Secondary | ICD-10-CM | POA: Diagnosis not present

## 2022-03-07 DIAGNOSIS — R29898 Other symptoms and signs involving the musculoskeletal system: Secondary | ICD-10-CM | POA: Diagnosis not present

## 2022-03-07 DIAGNOSIS — E1142 Type 2 diabetes mellitus with diabetic polyneuropathy: Secondary | ICD-10-CM | POA: Diagnosis not present

## 2022-03-07 DIAGNOSIS — D6869 Other thrombophilia: Secondary | ICD-10-CM | POA: Diagnosis not present

## 2022-03-13 DIAGNOSIS — E1142 Type 2 diabetes mellitus with diabetic polyneuropathy: Secondary | ICD-10-CM | POA: Diagnosis not present

## 2022-03-13 DIAGNOSIS — Z7984 Long term (current) use of oral hypoglycemic drugs: Secondary | ICD-10-CM | POA: Diagnosis not present

## 2022-03-13 DIAGNOSIS — E1122 Type 2 diabetes mellitus with diabetic chronic kidney disease: Secondary | ICD-10-CM | POA: Diagnosis not present

## 2022-03-13 DIAGNOSIS — Z7901 Long term (current) use of anticoagulants: Secondary | ICD-10-CM | POA: Diagnosis not present

## 2022-03-13 DIAGNOSIS — R29898 Other symptoms and signs involving the musculoskeletal system: Secondary | ICD-10-CM | POA: Diagnosis not present

## 2022-03-13 DIAGNOSIS — I130 Hypertensive heart and chronic kidney disease with heart failure and stage 1 through stage 4 chronic kidney disease, or unspecified chronic kidney disease: Secondary | ICD-10-CM | POA: Diagnosis not present

## 2022-03-13 DIAGNOSIS — N1832 Chronic kidney disease, stage 3b: Secondary | ICD-10-CM | POA: Diagnosis not present

## 2022-03-13 DIAGNOSIS — I350 Nonrheumatic aortic (valve) stenosis: Secondary | ICD-10-CM | POA: Diagnosis not present

## 2022-03-13 DIAGNOSIS — I480 Paroxysmal atrial fibrillation: Secondary | ICD-10-CM | POA: Diagnosis not present

## 2022-03-13 DIAGNOSIS — I5032 Chronic diastolic (congestive) heart failure: Secondary | ICD-10-CM | POA: Diagnosis not present

## 2022-03-15 DIAGNOSIS — E1142 Type 2 diabetes mellitus with diabetic polyneuropathy: Secondary | ICD-10-CM | POA: Diagnosis not present

## 2022-03-15 DIAGNOSIS — E1122 Type 2 diabetes mellitus with diabetic chronic kidney disease: Secondary | ICD-10-CM | POA: Diagnosis not present

## 2022-03-15 DIAGNOSIS — I5032 Chronic diastolic (congestive) heart failure: Secondary | ICD-10-CM | POA: Diagnosis not present

## 2022-03-15 DIAGNOSIS — R29898 Other symptoms and signs involving the musculoskeletal system: Secondary | ICD-10-CM | POA: Diagnosis not present

## 2022-03-15 DIAGNOSIS — I480 Paroxysmal atrial fibrillation: Secondary | ICD-10-CM | POA: Diagnosis not present

## 2022-03-15 DIAGNOSIS — Z7901 Long term (current) use of anticoagulants: Secondary | ICD-10-CM | POA: Diagnosis not present

## 2022-03-15 DIAGNOSIS — I350 Nonrheumatic aortic (valve) stenosis: Secondary | ICD-10-CM | POA: Diagnosis not present

## 2022-03-15 DIAGNOSIS — I130 Hypertensive heart and chronic kidney disease with heart failure and stage 1 through stage 4 chronic kidney disease, or unspecified chronic kidney disease: Secondary | ICD-10-CM | POA: Diagnosis not present

## 2022-03-15 DIAGNOSIS — N1832 Chronic kidney disease, stage 3b: Secondary | ICD-10-CM | POA: Diagnosis not present

## 2022-03-15 DIAGNOSIS — Z7984 Long term (current) use of oral hypoglycemic drugs: Secondary | ICD-10-CM | POA: Diagnosis not present

## 2022-03-20 DIAGNOSIS — E1142 Type 2 diabetes mellitus with diabetic polyneuropathy: Secondary | ICD-10-CM | POA: Diagnosis not present

## 2022-03-20 DIAGNOSIS — I480 Paroxysmal atrial fibrillation: Secondary | ICD-10-CM | POA: Diagnosis not present

## 2022-03-20 DIAGNOSIS — I130 Hypertensive heart and chronic kidney disease with heart failure and stage 1 through stage 4 chronic kidney disease, or unspecified chronic kidney disease: Secondary | ICD-10-CM | POA: Diagnosis not present

## 2022-03-20 DIAGNOSIS — R29898 Other symptoms and signs involving the musculoskeletal system: Secondary | ICD-10-CM | POA: Diagnosis not present

## 2022-03-20 DIAGNOSIS — E1122 Type 2 diabetes mellitus with diabetic chronic kidney disease: Secondary | ICD-10-CM | POA: Diagnosis not present

## 2022-03-20 DIAGNOSIS — N1832 Chronic kidney disease, stage 3b: Secondary | ICD-10-CM | POA: Diagnosis not present

## 2022-03-20 DIAGNOSIS — Z7901 Long term (current) use of anticoagulants: Secondary | ICD-10-CM | POA: Diagnosis not present

## 2022-03-20 DIAGNOSIS — I350 Nonrheumatic aortic (valve) stenosis: Secondary | ICD-10-CM | POA: Diagnosis not present

## 2022-03-20 DIAGNOSIS — I5032 Chronic diastolic (congestive) heart failure: Secondary | ICD-10-CM | POA: Diagnosis not present

## 2022-03-20 DIAGNOSIS — Z7984 Long term (current) use of oral hypoglycemic drugs: Secondary | ICD-10-CM | POA: Diagnosis not present

## 2022-03-23 DIAGNOSIS — I130 Hypertensive heart and chronic kidney disease with heart failure and stage 1 through stage 4 chronic kidney disease, or unspecified chronic kidney disease: Secondary | ICD-10-CM | POA: Diagnosis not present

## 2022-03-23 DIAGNOSIS — E1142 Type 2 diabetes mellitus with diabetic polyneuropathy: Secondary | ICD-10-CM | POA: Diagnosis not present

## 2022-03-23 DIAGNOSIS — I480 Paroxysmal atrial fibrillation: Secondary | ICD-10-CM | POA: Diagnosis not present

## 2022-03-23 DIAGNOSIS — I350 Nonrheumatic aortic (valve) stenosis: Secondary | ICD-10-CM | POA: Diagnosis not present

## 2022-03-23 DIAGNOSIS — I5032 Chronic diastolic (congestive) heart failure: Secondary | ICD-10-CM | POA: Diagnosis not present

## 2022-03-23 DIAGNOSIS — R29898 Other symptoms and signs involving the musculoskeletal system: Secondary | ICD-10-CM | POA: Diagnosis not present

## 2022-03-23 DIAGNOSIS — N1832 Chronic kidney disease, stage 3b: Secondary | ICD-10-CM | POA: Diagnosis not present

## 2022-03-23 DIAGNOSIS — Z7901 Long term (current) use of anticoagulants: Secondary | ICD-10-CM | POA: Diagnosis not present

## 2022-03-23 DIAGNOSIS — Z7984 Long term (current) use of oral hypoglycemic drugs: Secondary | ICD-10-CM | POA: Diagnosis not present

## 2022-03-23 DIAGNOSIS — E1122 Type 2 diabetes mellitus with diabetic chronic kidney disease: Secondary | ICD-10-CM | POA: Diagnosis not present

## 2022-03-27 DIAGNOSIS — S61002A Unspecified open wound of left thumb without damage to nail, initial encounter: Secondary | ICD-10-CM | POA: Diagnosis not present

## 2022-03-27 DIAGNOSIS — H6123 Impacted cerumen, bilateral: Secondary | ICD-10-CM | POA: Diagnosis not present

## 2022-03-29 DIAGNOSIS — I350 Nonrheumatic aortic (valve) stenosis: Secondary | ICD-10-CM | POA: Diagnosis not present

## 2022-03-29 DIAGNOSIS — N1832 Chronic kidney disease, stage 3b: Secondary | ICD-10-CM | POA: Diagnosis not present

## 2022-03-29 DIAGNOSIS — R29898 Other symptoms and signs involving the musculoskeletal system: Secondary | ICD-10-CM | POA: Diagnosis not present

## 2022-03-29 DIAGNOSIS — E1122 Type 2 diabetes mellitus with diabetic chronic kidney disease: Secondary | ICD-10-CM | POA: Diagnosis not present

## 2022-03-29 DIAGNOSIS — E1142 Type 2 diabetes mellitus with diabetic polyneuropathy: Secondary | ICD-10-CM | POA: Diagnosis not present

## 2022-03-29 DIAGNOSIS — Z7984 Long term (current) use of oral hypoglycemic drugs: Secondary | ICD-10-CM | POA: Diagnosis not present

## 2022-03-29 DIAGNOSIS — I5032 Chronic diastolic (congestive) heart failure: Secondary | ICD-10-CM | POA: Diagnosis not present

## 2022-03-29 DIAGNOSIS — Z7901 Long term (current) use of anticoagulants: Secondary | ICD-10-CM | POA: Diagnosis not present

## 2022-03-29 DIAGNOSIS — I480 Paroxysmal atrial fibrillation: Secondary | ICD-10-CM | POA: Diagnosis not present

## 2022-03-29 DIAGNOSIS — I130 Hypertensive heart and chronic kidney disease with heart failure and stage 1 through stage 4 chronic kidney disease, or unspecified chronic kidney disease: Secondary | ICD-10-CM | POA: Diagnosis not present

## 2022-03-30 DIAGNOSIS — L03012 Cellulitis of left finger: Secondary | ICD-10-CM | POA: Diagnosis not present

## 2022-04-05 DIAGNOSIS — I130 Hypertensive heart and chronic kidney disease with heart failure and stage 1 through stage 4 chronic kidney disease, or unspecified chronic kidney disease: Secondary | ICD-10-CM | POA: Diagnosis not present

## 2022-04-05 DIAGNOSIS — N1832 Chronic kidney disease, stage 3b: Secondary | ICD-10-CM | POA: Diagnosis not present

## 2022-04-05 DIAGNOSIS — E1142 Type 2 diabetes mellitus with diabetic polyneuropathy: Secondary | ICD-10-CM | POA: Diagnosis not present

## 2022-04-05 DIAGNOSIS — Z7901 Long term (current) use of anticoagulants: Secondary | ICD-10-CM | POA: Diagnosis not present

## 2022-04-05 DIAGNOSIS — R29898 Other symptoms and signs involving the musculoskeletal system: Secondary | ICD-10-CM | POA: Diagnosis not present

## 2022-04-05 DIAGNOSIS — E1122 Type 2 diabetes mellitus with diabetic chronic kidney disease: Secondary | ICD-10-CM | POA: Diagnosis not present

## 2022-04-05 DIAGNOSIS — Z7984 Long term (current) use of oral hypoglycemic drugs: Secondary | ICD-10-CM | POA: Diagnosis not present

## 2022-04-05 DIAGNOSIS — I350 Nonrheumatic aortic (valve) stenosis: Secondary | ICD-10-CM | POA: Diagnosis not present

## 2022-04-05 DIAGNOSIS — I5032 Chronic diastolic (congestive) heart failure: Secondary | ICD-10-CM | POA: Diagnosis not present

## 2022-04-05 DIAGNOSIS — I480 Paroxysmal atrial fibrillation: Secondary | ICD-10-CM | POA: Diagnosis not present

## 2022-04-13 DIAGNOSIS — I480 Paroxysmal atrial fibrillation: Secondary | ICD-10-CM | POA: Diagnosis not present

## 2022-04-13 DIAGNOSIS — E1122 Type 2 diabetes mellitus with diabetic chronic kidney disease: Secondary | ICD-10-CM | POA: Diagnosis not present

## 2022-04-13 DIAGNOSIS — I350 Nonrheumatic aortic (valve) stenosis: Secondary | ICD-10-CM | POA: Diagnosis not present

## 2022-04-13 DIAGNOSIS — I130 Hypertensive heart and chronic kidney disease with heart failure and stage 1 through stage 4 chronic kidney disease, or unspecified chronic kidney disease: Secondary | ICD-10-CM | POA: Diagnosis not present

## 2022-04-13 DIAGNOSIS — I5032 Chronic diastolic (congestive) heart failure: Secondary | ICD-10-CM | POA: Diagnosis not present

## 2022-04-13 DIAGNOSIS — Z7984 Long term (current) use of oral hypoglycemic drugs: Secondary | ICD-10-CM | POA: Diagnosis not present

## 2022-04-13 DIAGNOSIS — R29898 Other symptoms and signs involving the musculoskeletal system: Secondary | ICD-10-CM | POA: Diagnosis not present

## 2022-04-13 DIAGNOSIS — Z7901 Long term (current) use of anticoagulants: Secondary | ICD-10-CM | POA: Diagnosis not present

## 2022-04-13 DIAGNOSIS — E1142 Type 2 diabetes mellitus with diabetic polyneuropathy: Secondary | ICD-10-CM | POA: Diagnosis not present

## 2022-04-13 DIAGNOSIS — N1832 Chronic kidney disease, stage 3b: Secondary | ICD-10-CM | POA: Diagnosis not present

## 2022-04-17 DIAGNOSIS — E1142 Type 2 diabetes mellitus with diabetic polyneuropathy: Secondary | ICD-10-CM | POA: Diagnosis not present

## 2022-04-17 DIAGNOSIS — E1122 Type 2 diabetes mellitus with diabetic chronic kidney disease: Secondary | ICD-10-CM | POA: Diagnosis not present

## 2022-04-17 DIAGNOSIS — I350 Nonrheumatic aortic (valve) stenosis: Secondary | ICD-10-CM | POA: Diagnosis not present

## 2022-04-17 DIAGNOSIS — I480 Paroxysmal atrial fibrillation: Secondary | ICD-10-CM | POA: Diagnosis not present

## 2022-04-17 DIAGNOSIS — N1832 Chronic kidney disease, stage 3b: Secondary | ICD-10-CM | POA: Diagnosis not present

## 2022-04-17 DIAGNOSIS — Z7901 Long term (current) use of anticoagulants: Secondary | ICD-10-CM | POA: Diagnosis not present

## 2022-04-17 DIAGNOSIS — R29898 Other symptoms and signs involving the musculoskeletal system: Secondary | ICD-10-CM | POA: Diagnosis not present

## 2022-04-17 DIAGNOSIS — I130 Hypertensive heart and chronic kidney disease with heart failure and stage 1 through stage 4 chronic kidney disease, or unspecified chronic kidney disease: Secondary | ICD-10-CM | POA: Diagnosis not present

## 2022-04-17 DIAGNOSIS — I5032 Chronic diastolic (congestive) heart failure: Secondary | ICD-10-CM | POA: Diagnosis not present

## 2022-04-17 DIAGNOSIS — Z7984 Long term (current) use of oral hypoglycemic drugs: Secondary | ICD-10-CM | POA: Diagnosis not present

## 2022-04-24 DIAGNOSIS — I350 Nonrheumatic aortic (valve) stenosis: Secondary | ICD-10-CM | POA: Diagnosis not present

## 2022-04-24 DIAGNOSIS — N1832 Chronic kidney disease, stage 3b: Secondary | ICD-10-CM | POA: Diagnosis not present

## 2022-04-24 DIAGNOSIS — I480 Paroxysmal atrial fibrillation: Secondary | ICD-10-CM | POA: Diagnosis not present

## 2022-04-24 DIAGNOSIS — Z7901 Long term (current) use of anticoagulants: Secondary | ICD-10-CM | POA: Diagnosis not present

## 2022-04-24 DIAGNOSIS — E1122 Type 2 diabetes mellitus with diabetic chronic kidney disease: Secondary | ICD-10-CM | POA: Diagnosis not present

## 2022-04-24 DIAGNOSIS — R29898 Other symptoms and signs involving the musculoskeletal system: Secondary | ICD-10-CM | POA: Diagnosis not present

## 2022-04-24 DIAGNOSIS — I130 Hypertensive heart and chronic kidney disease with heart failure and stage 1 through stage 4 chronic kidney disease, or unspecified chronic kidney disease: Secondary | ICD-10-CM | POA: Diagnosis not present

## 2022-04-24 DIAGNOSIS — E1142 Type 2 diabetes mellitus with diabetic polyneuropathy: Secondary | ICD-10-CM | POA: Diagnosis not present

## 2022-04-24 DIAGNOSIS — I5032 Chronic diastolic (congestive) heart failure: Secondary | ICD-10-CM | POA: Diagnosis not present

## 2022-04-24 DIAGNOSIS — Z7984 Long term (current) use of oral hypoglycemic drugs: Secondary | ICD-10-CM | POA: Diagnosis not present

## 2022-05-01 DIAGNOSIS — Z961 Presence of intraocular lens: Secondary | ICD-10-CM | POA: Diagnosis not present

## 2022-05-01 DIAGNOSIS — H401133 Primary open-angle glaucoma, bilateral, severe stage: Secondary | ICD-10-CM | POA: Diagnosis not present

## 2022-05-01 DIAGNOSIS — H1820 Unspecified corneal edema: Secondary | ICD-10-CM | POA: Diagnosis not present

## 2022-05-03 DIAGNOSIS — Z7901 Long term (current) use of anticoagulants: Secondary | ICD-10-CM | POA: Diagnosis not present

## 2022-05-03 DIAGNOSIS — I480 Paroxysmal atrial fibrillation: Secondary | ICD-10-CM | POA: Diagnosis not present

## 2022-05-03 DIAGNOSIS — N1832 Chronic kidney disease, stage 3b: Secondary | ICD-10-CM | POA: Diagnosis not present

## 2022-05-03 DIAGNOSIS — I5032 Chronic diastolic (congestive) heart failure: Secondary | ICD-10-CM | POA: Diagnosis not present

## 2022-05-03 DIAGNOSIS — I130 Hypertensive heart and chronic kidney disease with heart failure and stage 1 through stage 4 chronic kidney disease, or unspecified chronic kidney disease: Secondary | ICD-10-CM | POA: Diagnosis not present

## 2022-05-03 DIAGNOSIS — E1122 Type 2 diabetes mellitus with diabetic chronic kidney disease: Secondary | ICD-10-CM | POA: Diagnosis not present

## 2022-05-03 DIAGNOSIS — Z7984 Long term (current) use of oral hypoglycemic drugs: Secondary | ICD-10-CM | POA: Diagnosis not present

## 2022-05-03 DIAGNOSIS — I350 Nonrheumatic aortic (valve) stenosis: Secondary | ICD-10-CM | POA: Diagnosis not present

## 2022-05-03 DIAGNOSIS — R29898 Other symptoms and signs involving the musculoskeletal system: Secondary | ICD-10-CM | POA: Diagnosis not present

## 2022-05-03 DIAGNOSIS — E1142 Type 2 diabetes mellitus with diabetic polyneuropathy: Secondary | ICD-10-CM | POA: Diagnosis not present

## 2022-05-05 DIAGNOSIS — E1122 Type 2 diabetes mellitus with diabetic chronic kidney disease: Secondary | ICD-10-CM | POA: Diagnosis not present

## 2022-05-05 DIAGNOSIS — R29898 Other symptoms and signs involving the musculoskeletal system: Secondary | ICD-10-CM | POA: Diagnosis not present

## 2022-05-05 DIAGNOSIS — I350 Nonrheumatic aortic (valve) stenosis: Secondary | ICD-10-CM | POA: Diagnosis not present

## 2022-05-05 DIAGNOSIS — I5032 Chronic diastolic (congestive) heart failure: Secondary | ICD-10-CM | POA: Diagnosis not present

## 2022-05-05 DIAGNOSIS — Z7901 Long term (current) use of anticoagulants: Secondary | ICD-10-CM | POA: Diagnosis not present

## 2022-05-05 DIAGNOSIS — N1832 Chronic kidney disease, stage 3b: Secondary | ICD-10-CM | POA: Diagnosis not present

## 2022-05-05 DIAGNOSIS — I130 Hypertensive heart and chronic kidney disease with heart failure and stage 1 through stage 4 chronic kidney disease, or unspecified chronic kidney disease: Secondary | ICD-10-CM | POA: Diagnosis not present

## 2022-05-05 DIAGNOSIS — Z7984 Long term (current) use of oral hypoglycemic drugs: Secondary | ICD-10-CM | POA: Diagnosis not present

## 2022-05-05 DIAGNOSIS — I480 Paroxysmal atrial fibrillation: Secondary | ICD-10-CM | POA: Diagnosis not present

## 2022-05-05 DIAGNOSIS — E1142 Type 2 diabetes mellitus with diabetic polyneuropathy: Secondary | ICD-10-CM | POA: Diagnosis not present

## 2022-05-08 DIAGNOSIS — E1142 Type 2 diabetes mellitus with diabetic polyneuropathy: Secondary | ICD-10-CM | POA: Diagnosis not present

## 2022-05-08 DIAGNOSIS — I5032 Chronic diastolic (congestive) heart failure: Secondary | ICD-10-CM | POA: Diagnosis not present

## 2022-05-08 DIAGNOSIS — R29898 Other symptoms and signs involving the musculoskeletal system: Secondary | ICD-10-CM | POA: Diagnosis not present

## 2022-05-08 DIAGNOSIS — Z7901 Long term (current) use of anticoagulants: Secondary | ICD-10-CM | POA: Diagnosis not present

## 2022-05-08 DIAGNOSIS — N1832 Chronic kidney disease, stage 3b: Secondary | ICD-10-CM | POA: Diagnosis not present

## 2022-05-08 DIAGNOSIS — I350 Nonrheumatic aortic (valve) stenosis: Secondary | ICD-10-CM | POA: Diagnosis not present

## 2022-05-08 DIAGNOSIS — I480 Paroxysmal atrial fibrillation: Secondary | ICD-10-CM | POA: Diagnosis not present

## 2022-05-08 DIAGNOSIS — I130 Hypertensive heart and chronic kidney disease with heart failure and stage 1 through stage 4 chronic kidney disease, or unspecified chronic kidney disease: Secondary | ICD-10-CM | POA: Diagnosis not present

## 2022-05-08 DIAGNOSIS — Z7984 Long term (current) use of oral hypoglycemic drugs: Secondary | ICD-10-CM | POA: Diagnosis not present

## 2022-05-08 DIAGNOSIS — E1122 Type 2 diabetes mellitus with diabetic chronic kidney disease: Secondary | ICD-10-CM | POA: Diagnosis not present

## 2022-05-16 DIAGNOSIS — I5032 Chronic diastolic (congestive) heart failure: Secondary | ICD-10-CM | POA: Diagnosis not present

## 2022-05-16 DIAGNOSIS — E1142 Type 2 diabetes mellitus with diabetic polyneuropathy: Secondary | ICD-10-CM | POA: Diagnosis not present

## 2022-05-16 DIAGNOSIS — Z7901 Long term (current) use of anticoagulants: Secondary | ICD-10-CM | POA: Diagnosis not present

## 2022-05-16 DIAGNOSIS — N1832 Chronic kidney disease, stage 3b: Secondary | ICD-10-CM | POA: Diagnosis not present

## 2022-05-16 DIAGNOSIS — I130 Hypertensive heart and chronic kidney disease with heart failure and stage 1 through stage 4 chronic kidney disease, or unspecified chronic kidney disease: Secondary | ICD-10-CM | POA: Diagnosis not present

## 2022-05-16 DIAGNOSIS — Z7984 Long term (current) use of oral hypoglycemic drugs: Secondary | ICD-10-CM | POA: Diagnosis not present

## 2022-05-16 DIAGNOSIS — E1122 Type 2 diabetes mellitus with diabetic chronic kidney disease: Secondary | ICD-10-CM | POA: Diagnosis not present

## 2022-05-16 DIAGNOSIS — I480 Paroxysmal atrial fibrillation: Secondary | ICD-10-CM | POA: Diagnosis not present

## 2022-05-16 DIAGNOSIS — I350 Nonrheumatic aortic (valve) stenosis: Secondary | ICD-10-CM | POA: Diagnosis not present

## 2022-05-16 DIAGNOSIS — R29898 Other symptoms and signs involving the musculoskeletal system: Secondary | ICD-10-CM | POA: Diagnosis not present

## 2022-05-18 DIAGNOSIS — I130 Hypertensive heart and chronic kidney disease with heart failure and stage 1 through stage 4 chronic kidney disease, or unspecified chronic kidney disease: Secondary | ICD-10-CM | POA: Diagnosis not present

## 2022-05-18 DIAGNOSIS — Z7984 Long term (current) use of oral hypoglycemic drugs: Secondary | ICD-10-CM | POA: Diagnosis not present

## 2022-05-18 DIAGNOSIS — N1832 Chronic kidney disease, stage 3b: Secondary | ICD-10-CM | POA: Diagnosis not present

## 2022-05-18 DIAGNOSIS — I350 Nonrheumatic aortic (valve) stenosis: Secondary | ICD-10-CM | POA: Diagnosis not present

## 2022-05-18 DIAGNOSIS — I5032 Chronic diastolic (congestive) heart failure: Secondary | ICD-10-CM | POA: Diagnosis not present

## 2022-05-18 DIAGNOSIS — R29898 Other symptoms and signs involving the musculoskeletal system: Secondary | ICD-10-CM | POA: Diagnosis not present

## 2022-05-18 DIAGNOSIS — E1122 Type 2 diabetes mellitus with diabetic chronic kidney disease: Secondary | ICD-10-CM | POA: Diagnosis not present

## 2022-05-18 DIAGNOSIS — Z7901 Long term (current) use of anticoagulants: Secondary | ICD-10-CM | POA: Diagnosis not present

## 2022-05-18 DIAGNOSIS — I480 Paroxysmal atrial fibrillation: Secondary | ICD-10-CM | POA: Diagnosis not present

## 2022-05-18 DIAGNOSIS — E1142 Type 2 diabetes mellitus with diabetic polyneuropathy: Secondary | ICD-10-CM | POA: Diagnosis not present

## 2022-05-23 DIAGNOSIS — R29898 Other symptoms and signs involving the musculoskeletal system: Secondary | ICD-10-CM | POA: Diagnosis not present

## 2022-05-23 DIAGNOSIS — E1122 Type 2 diabetes mellitus with diabetic chronic kidney disease: Secondary | ICD-10-CM | POA: Diagnosis not present

## 2022-05-23 DIAGNOSIS — I480 Paroxysmal atrial fibrillation: Secondary | ICD-10-CM | POA: Diagnosis not present

## 2022-05-23 DIAGNOSIS — I350 Nonrheumatic aortic (valve) stenosis: Secondary | ICD-10-CM | POA: Diagnosis not present

## 2022-05-23 DIAGNOSIS — I5032 Chronic diastolic (congestive) heart failure: Secondary | ICD-10-CM | POA: Diagnosis not present

## 2022-05-23 DIAGNOSIS — Z7901 Long term (current) use of anticoagulants: Secondary | ICD-10-CM | POA: Diagnosis not present

## 2022-05-23 DIAGNOSIS — I130 Hypertensive heart and chronic kidney disease with heart failure and stage 1 through stage 4 chronic kidney disease, or unspecified chronic kidney disease: Secondary | ICD-10-CM | POA: Diagnosis not present

## 2022-05-23 DIAGNOSIS — Z7984 Long term (current) use of oral hypoglycemic drugs: Secondary | ICD-10-CM | POA: Diagnosis not present

## 2022-05-23 DIAGNOSIS — N1832 Chronic kidney disease, stage 3b: Secondary | ICD-10-CM | POA: Diagnosis not present

## 2022-05-23 DIAGNOSIS — E1142 Type 2 diabetes mellitus with diabetic polyneuropathy: Secondary | ICD-10-CM | POA: Diagnosis not present

## 2022-05-24 DIAGNOSIS — Z7984 Long term (current) use of oral hypoglycemic drugs: Secondary | ICD-10-CM | POA: Diagnosis not present

## 2022-05-24 DIAGNOSIS — E119 Type 2 diabetes mellitus without complications: Secondary | ICD-10-CM | POA: Diagnosis not present

## 2022-05-24 DIAGNOSIS — Z961 Presence of intraocular lens: Secondary | ICD-10-CM | POA: Diagnosis not present

## 2022-05-24 DIAGNOSIS — H1820 Unspecified corneal edema: Secondary | ICD-10-CM | POA: Diagnosis not present

## 2022-05-24 DIAGNOSIS — H401133 Primary open-angle glaucoma, bilateral, severe stage: Secondary | ICD-10-CM | POA: Diagnosis not present

## 2022-05-25 DIAGNOSIS — N1832 Chronic kidney disease, stage 3b: Secondary | ICD-10-CM | POA: Diagnosis not present

## 2022-05-25 DIAGNOSIS — I130 Hypertensive heart and chronic kidney disease with heart failure and stage 1 through stage 4 chronic kidney disease, or unspecified chronic kidney disease: Secondary | ICD-10-CM | POA: Diagnosis not present

## 2022-05-25 DIAGNOSIS — I350 Nonrheumatic aortic (valve) stenosis: Secondary | ICD-10-CM | POA: Diagnosis not present

## 2022-05-25 DIAGNOSIS — I5032 Chronic diastolic (congestive) heart failure: Secondary | ICD-10-CM | POA: Diagnosis not present

## 2022-05-25 DIAGNOSIS — E1142 Type 2 diabetes mellitus with diabetic polyneuropathy: Secondary | ICD-10-CM | POA: Diagnosis not present

## 2022-05-25 DIAGNOSIS — E1122 Type 2 diabetes mellitus with diabetic chronic kidney disease: Secondary | ICD-10-CM | POA: Diagnosis not present

## 2022-05-25 DIAGNOSIS — I480 Paroxysmal atrial fibrillation: Secondary | ICD-10-CM | POA: Diagnosis not present

## 2022-05-25 DIAGNOSIS — R29898 Other symptoms and signs involving the musculoskeletal system: Secondary | ICD-10-CM | POA: Diagnosis not present

## 2022-05-25 DIAGNOSIS — Z7984 Long term (current) use of oral hypoglycemic drugs: Secondary | ICD-10-CM | POA: Diagnosis not present

## 2022-05-25 DIAGNOSIS — Z7901 Long term (current) use of anticoagulants: Secondary | ICD-10-CM | POA: Diagnosis not present

## 2022-05-29 DIAGNOSIS — Z7984 Long term (current) use of oral hypoglycemic drugs: Secondary | ICD-10-CM | POA: Diagnosis not present

## 2022-05-29 DIAGNOSIS — E1122 Type 2 diabetes mellitus with diabetic chronic kidney disease: Secondary | ICD-10-CM | POA: Diagnosis not present

## 2022-05-29 DIAGNOSIS — N1832 Chronic kidney disease, stage 3b: Secondary | ICD-10-CM | POA: Diagnosis not present

## 2022-05-29 DIAGNOSIS — I130 Hypertensive heart and chronic kidney disease with heart failure and stage 1 through stage 4 chronic kidney disease, or unspecified chronic kidney disease: Secondary | ICD-10-CM | POA: Diagnosis not present

## 2022-05-29 DIAGNOSIS — I480 Paroxysmal atrial fibrillation: Secondary | ICD-10-CM | POA: Diagnosis not present

## 2022-05-29 DIAGNOSIS — I5032 Chronic diastolic (congestive) heart failure: Secondary | ICD-10-CM | POA: Diagnosis not present

## 2022-05-29 DIAGNOSIS — I350 Nonrheumatic aortic (valve) stenosis: Secondary | ICD-10-CM | POA: Diagnosis not present

## 2022-05-29 DIAGNOSIS — Z7901 Long term (current) use of anticoagulants: Secondary | ICD-10-CM | POA: Diagnosis not present

## 2022-05-29 DIAGNOSIS — E1142 Type 2 diabetes mellitus with diabetic polyneuropathy: Secondary | ICD-10-CM | POA: Diagnosis not present

## 2022-05-29 DIAGNOSIS — R29898 Other symptoms and signs involving the musculoskeletal system: Secondary | ICD-10-CM | POA: Diagnosis not present

## 2022-05-31 DIAGNOSIS — R29898 Other symptoms and signs involving the musculoskeletal system: Secondary | ICD-10-CM | POA: Diagnosis not present

## 2022-05-31 DIAGNOSIS — Z7984 Long term (current) use of oral hypoglycemic drugs: Secondary | ICD-10-CM | POA: Diagnosis not present

## 2022-05-31 DIAGNOSIS — I130 Hypertensive heart and chronic kidney disease with heart failure and stage 1 through stage 4 chronic kidney disease, or unspecified chronic kidney disease: Secondary | ICD-10-CM | POA: Diagnosis not present

## 2022-05-31 DIAGNOSIS — E1142 Type 2 diabetes mellitus with diabetic polyneuropathy: Secondary | ICD-10-CM | POA: Diagnosis not present

## 2022-05-31 DIAGNOSIS — Z7901 Long term (current) use of anticoagulants: Secondary | ICD-10-CM | POA: Diagnosis not present

## 2022-05-31 DIAGNOSIS — N1832 Chronic kidney disease, stage 3b: Secondary | ICD-10-CM | POA: Diagnosis not present

## 2022-05-31 DIAGNOSIS — E1122 Type 2 diabetes mellitus with diabetic chronic kidney disease: Secondary | ICD-10-CM | POA: Diagnosis not present

## 2022-05-31 DIAGNOSIS — I5032 Chronic diastolic (congestive) heart failure: Secondary | ICD-10-CM | POA: Diagnosis not present

## 2022-05-31 DIAGNOSIS — I480 Paroxysmal atrial fibrillation: Secondary | ICD-10-CM | POA: Diagnosis not present

## 2022-05-31 DIAGNOSIS — I350 Nonrheumatic aortic (valve) stenosis: Secondary | ICD-10-CM | POA: Diagnosis not present

## 2022-06-06 DIAGNOSIS — I350 Nonrheumatic aortic (valve) stenosis: Secondary | ICD-10-CM | POA: Diagnosis not present

## 2022-06-06 DIAGNOSIS — N1832 Chronic kidney disease, stage 3b: Secondary | ICD-10-CM | POA: Diagnosis not present

## 2022-06-06 DIAGNOSIS — I480 Paroxysmal atrial fibrillation: Secondary | ICD-10-CM | POA: Diagnosis not present

## 2022-06-06 DIAGNOSIS — E1142 Type 2 diabetes mellitus with diabetic polyneuropathy: Secondary | ICD-10-CM | POA: Diagnosis not present

## 2022-06-06 DIAGNOSIS — Z7901 Long term (current) use of anticoagulants: Secondary | ICD-10-CM | POA: Diagnosis not present

## 2022-06-06 DIAGNOSIS — I130 Hypertensive heart and chronic kidney disease with heart failure and stage 1 through stage 4 chronic kidney disease, or unspecified chronic kidney disease: Secondary | ICD-10-CM | POA: Diagnosis not present

## 2022-06-06 DIAGNOSIS — R29898 Other symptoms and signs involving the musculoskeletal system: Secondary | ICD-10-CM | POA: Diagnosis not present

## 2022-06-06 DIAGNOSIS — Z7984 Long term (current) use of oral hypoglycemic drugs: Secondary | ICD-10-CM | POA: Diagnosis not present

## 2022-06-06 DIAGNOSIS — I5032 Chronic diastolic (congestive) heart failure: Secondary | ICD-10-CM | POA: Diagnosis not present

## 2022-06-06 DIAGNOSIS — E1122 Type 2 diabetes mellitus with diabetic chronic kidney disease: Secondary | ICD-10-CM | POA: Diagnosis not present

## 2022-06-09 DIAGNOSIS — N1832 Chronic kidney disease, stage 3b: Secondary | ICD-10-CM | POA: Diagnosis not present

## 2022-06-09 DIAGNOSIS — R29898 Other symptoms and signs involving the musculoskeletal system: Secondary | ICD-10-CM | POA: Diagnosis not present

## 2022-06-09 DIAGNOSIS — E1122 Type 2 diabetes mellitus with diabetic chronic kidney disease: Secondary | ICD-10-CM | POA: Diagnosis not present

## 2022-06-09 DIAGNOSIS — E1142 Type 2 diabetes mellitus with diabetic polyneuropathy: Secondary | ICD-10-CM | POA: Diagnosis not present

## 2022-06-09 DIAGNOSIS — Z7984 Long term (current) use of oral hypoglycemic drugs: Secondary | ICD-10-CM | POA: Diagnosis not present

## 2022-06-09 DIAGNOSIS — I350 Nonrheumatic aortic (valve) stenosis: Secondary | ICD-10-CM | POA: Diagnosis not present

## 2022-06-09 DIAGNOSIS — I480 Paroxysmal atrial fibrillation: Secondary | ICD-10-CM | POA: Diagnosis not present

## 2022-06-09 DIAGNOSIS — Z7901 Long term (current) use of anticoagulants: Secondary | ICD-10-CM | POA: Diagnosis not present

## 2022-06-09 DIAGNOSIS — I130 Hypertensive heart and chronic kidney disease with heart failure and stage 1 through stage 4 chronic kidney disease, or unspecified chronic kidney disease: Secondary | ICD-10-CM | POA: Diagnosis not present

## 2022-06-09 DIAGNOSIS — I5032 Chronic diastolic (congestive) heart failure: Secondary | ICD-10-CM | POA: Diagnosis not present

## 2022-06-12 DIAGNOSIS — E119 Type 2 diabetes mellitus without complications: Secondary | ICD-10-CM | POA: Diagnosis not present

## 2022-06-12 DIAGNOSIS — H18222 Idiopathic corneal edema, left eye: Secondary | ICD-10-CM | POA: Diagnosis not present

## 2022-06-12 DIAGNOSIS — H401113 Primary open-angle glaucoma, right eye, severe stage: Secondary | ICD-10-CM | POA: Diagnosis not present

## 2022-06-12 DIAGNOSIS — Z961 Presence of intraocular lens: Secondary | ICD-10-CM | POA: Diagnosis not present

## 2022-06-13 DIAGNOSIS — I130 Hypertensive heart and chronic kidney disease with heart failure and stage 1 through stage 4 chronic kidney disease, or unspecified chronic kidney disease: Secondary | ICD-10-CM | POA: Diagnosis not present

## 2022-06-13 DIAGNOSIS — L089 Local infection of the skin and subcutaneous tissue, unspecified: Secondary | ICD-10-CM | POA: Diagnosis not present

## 2022-06-13 DIAGNOSIS — I5032 Chronic diastolic (congestive) heart failure: Secondary | ICD-10-CM | POA: Diagnosis not present

## 2022-06-13 DIAGNOSIS — E1122 Type 2 diabetes mellitus with diabetic chronic kidney disease: Secondary | ICD-10-CM | POA: Diagnosis not present

## 2022-06-13 DIAGNOSIS — E1142 Type 2 diabetes mellitus with diabetic polyneuropathy: Secondary | ICD-10-CM | POA: Diagnosis not present

## 2022-06-13 DIAGNOSIS — I129 Hypertensive chronic kidney disease with stage 1 through stage 4 chronic kidney disease, or unspecified chronic kidney disease: Secondary | ICD-10-CM | POA: Diagnosis not present

## 2022-06-13 DIAGNOSIS — N1832 Chronic kidney disease, stage 3b: Secondary | ICD-10-CM | POA: Diagnosis not present

## 2022-06-13 DIAGNOSIS — T148XXA Other injury of unspecified body region, initial encounter: Secondary | ICD-10-CM | POA: Diagnosis not present

## 2022-06-16 DIAGNOSIS — N1832 Chronic kidney disease, stage 3b: Secondary | ICD-10-CM | POA: Diagnosis not present

## 2022-06-16 DIAGNOSIS — E1142 Type 2 diabetes mellitus with diabetic polyneuropathy: Secondary | ICD-10-CM | POA: Diagnosis not present

## 2022-06-16 DIAGNOSIS — E1122 Type 2 diabetes mellitus with diabetic chronic kidney disease: Secondary | ICD-10-CM | POA: Diagnosis not present

## 2022-06-16 DIAGNOSIS — I5032 Chronic diastolic (congestive) heart failure: Secondary | ICD-10-CM | POA: Diagnosis not present

## 2022-06-16 DIAGNOSIS — I350 Nonrheumatic aortic (valve) stenosis: Secondary | ICD-10-CM | POA: Diagnosis not present

## 2022-06-16 DIAGNOSIS — Z7984 Long term (current) use of oral hypoglycemic drugs: Secondary | ICD-10-CM | POA: Diagnosis not present

## 2022-06-16 DIAGNOSIS — Z7901 Long term (current) use of anticoagulants: Secondary | ICD-10-CM | POA: Diagnosis not present

## 2022-06-16 DIAGNOSIS — I480 Paroxysmal atrial fibrillation: Secondary | ICD-10-CM | POA: Diagnosis not present

## 2022-06-16 DIAGNOSIS — I130 Hypertensive heart and chronic kidney disease with heart failure and stage 1 through stage 4 chronic kidney disease, or unspecified chronic kidney disease: Secondary | ICD-10-CM | POA: Diagnosis not present

## 2022-06-16 DIAGNOSIS — R29898 Other symptoms and signs involving the musculoskeletal system: Secondary | ICD-10-CM | POA: Diagnosis not present

## 2022-06-19 DIAGNOSIS — R29898 Other symptoms and signs involving the musculoskeletal system: Secondary | ICD-10-CM | POA: Diagnosis not present

## 2022-06-19 DIAGNOSIS — Z7901 Long term (current) use of anticoagulants: Secondary | ICD-10-CM | POA: Diagnosis not present

## 2022-06-19 DIAGNOSIS — E1122 Type 2 diabetes mellitus with diabetic chronic kidney disease: Secondary | ICD-10-CM | POA: Diagnosis not present

## 2022-06-19 DIAGNOSIS — I5032 Chronic diastolic (congestive) heart failure: Secondary | ICD-10-CM | POA: Diagnosis not present

## 2022-06-19 DIAGNOSIS — I350 Nonrheumatic aortic (valve) stenosis: Secondary | ICD-10-CM | POA: Diagnosis not present

## 2022-06-19 DIAGNOSIS — I480 Paroxysmal atrial fibrillation: Secondary | ICD-10-CM | POA: Diagnosis not present

## 2022-06-19 DIAGNOSIS — I130 Hypertensive heart and chronic kidney disease with heart failure and stage 1 through stage 4 chronic kidney disease, or unspecified chronic kidney disease: Secondary | ICD-10-CM | POA: Diagnosis not present

## 2022-06-19 DIAGNOSIS — Z7984 Long term (current) use of oral hypoglycemic drugs: Secondary | ICD-10-CM | POA: Diagnosis not present

## 2022-06-19 DIAGNOSIS — E1142 Type 2 diabetes mellitus with diabetic polyneuropathy: Secondary | ICD-10-CM | POA: Diagnosis not present

## 2022-06-19 DIAGNOSIS — N1832 Chronic kidney disease, stage 3b: Secondary | ICD-10-CM | POA: Diagnosis not present

## 2022-06-23 DIAGNOSIS — R29898 Other symptoms and signs involving the musculoskeletal system: Secondary | ICD-10-CM | POA: Diagnosis not present

## 2022-06-23 DIAGNOSIS — I480 Paroxysmal atrial fibrillation: Secondary | ICD-10-CM | POA: Diagnosis not present

## 2022-06-23 DIAGNOSIS — I5032 Chronic diastolic (congestive) heart failure: Secondary | ICD-10-CM | POA: Diagnosis not present

## 2022-06-23 DIAGNOSIS — I350 Nonrheumatic aortic (valve) stenosis: Secondary | ICD-10-CM | POA: Diagnosis not present

## 2022-06-23 DIAGNOSIS — N1832 Chronic kidney disease, stage 3b: Secondary | ICD-10-CM | POA: Diagnosis not present

## 2022-06-23 DIAGNOSIS — Z7901 Long term (current) use of anticoagulants: Secondary | ICD-10-CM | POA: Diagnosis not present

## 2022-06-23 DIAGNOSIS — Z7984 Long term (current) use of oral hypoglycemic drugs: Secondary | ICD-10-CM | POA: Diagnosis not present

## 2022-06-23 DIAGNOSIS — E1122 Type 2 diabetes mellitus with diabetic chronic kidney disease: Secondary | ICD-10-CM | POA: Diagnosis not present

## 2022-06-23 DIAGNOSIS — E1142 Type 2 diabetes mellitus with diabetic polyneuropathy: Secondary | ICD-10-CM | POA: Diagnosis not present

## 2022-06-23 DIAGNOSIS — I130 Hypertensive heart and chronic kidney disease with heart failure and stage 1 through stage 4 chronic kidney disease, or unspecified chronic kidney disease: Secondary | ICD-10-CM | POA: Diagnosis not present

## 2022-06-28 DIAGNOSIS — Z7901 Long term (current) use of anticoagulants: Secondary | ICD-10-CM | POA: Diagnosis not present

## 2022-06-28 DIAGNOSIS — N1832 Chronic kidney disease, stage 3b: Secondary | ICD-10-CM | POA: Diagnosis not present

## 2022-06-28 DIAGNOSIS — I480 Paroxysmal atrial fibrillation: Secondary | ICD-10-CM | POA: Diagnosis not present

## 2022-06-28 DIAGNOSIS — I350 Nonrheumatic aortic (valve) stenosis: Secondary | ICD-10-CM | POA: Diagnosis not present

## 2022-06-28 DIAGNOSIS — E1122 Type 2 diabetes mellitus with diabetic chronic kidney disease: Secondary | ICD-10-CM | POA: Diagnosis not present

## 2022-06-28 DIAGNOSIS — I5032 Chronic diastolic (congestive) heart failure: Secondary | ICD-10-CM | POA: Diagnosis not present

## 2022-06-28 DIAGNOSIS — E1142 Type 2 diabetes mellitus with diabetic polyneuropathy: Secondary | ICD-10-CM | POA: Diagnosis not present

## 2022-06-28 DIAGNOSIS — R29898 Other symptoms and signs involving the musculoskeletal system: Secondary | ICD-10-CM | POA: Diagnosis not present

## 2022-06-28 DIAGNOSIS — Z7984 Long term (current) use of oral hypoglycemic drugs: Secondary | ICD-10-CM | POA: Diagnosis not present

## 2022-06-28 DIAGNOSIS — I130 Hypertensive heart and chronic kidney disease with heart failure and stage 1 through stage 4 chronic kidney disease, or unspecified chronic kidney disease: Secondary | ICD-10-CM | POA: Diagnosis not present

## 2022-07-06 DIAGNOSIS — Z7984 Long term (current) use of oral hypoglycemic drugs: Secondary | ICD-10-CM | POA: Diagnosis not present

## 2022-07-06 DIAGNOSIS — N1832 Chronic kidney disease, stage 3b: Secondary | ICD-10-CM | POA: Diagnosis not present

## 2022-07-06 DIAGNOSIS — Z7901 Long term (current) use of anticoagulants: Secondary | ICD-10-CM | POA: Diagnosis not present

## 2022-07-06 DIAGNOSIS — I130 Hypertensive heart and chronic kidney disease with heart failure and stage 1 through stage 4 chronic kidney disease, or unspecified chronic kidney disease: Secondary | ICD-10-CM | POA: Diagnosis not present

## 2022-07-06 DIAGNOSIS — E1142 Type 2 diabetes mellitus with diabetic polyneuropathy: Secondary | ICD-10-CM | POA: Diagnosis not present

## 2022-07-06 DIAGNOSIS — R29898 Other symptoms and signs involving the musculoskeletal system: Secondary | ICD-10-CM | POA: Diagnosis not present

## 2022-07-06 DIAGNOSIS — I350 Nonrheumatic aortic (valve) stenosis: Secondary | ICD-10-CM | POA: Diagnosis not present

## 2022-07-06 DIAGNOSIS — I480 Paroxysmal atrial fibrillation: Secondary | ICD-10-CM | POA: Diagnosis not present

## 2022-07-06 DIAGNOSIS — E1122 Type 2 diabetes mellitus with diabetic chronic kidney disease: Secondary | ICD-10-CM | POA: Diagnosis not present

## 2022-07-06 DIAGNOSIS — I5032 Chronic diastolic (congestive) heart failure: Secondary | ICD-10-CM | POA: Diagnosis not present

## 2022-07-11 DIAGNOSIS — Z7901 Long term (current) use of anticoagulants: Secondary | ICD-10-CM | POA: Diagnosis not present

## 2022-07-11 DIAGNOSIS — N1832 Chronic kidney disease, stage 3b: Secondary | ICD-10-CM | POA: Diagnosis not present

## 2022-07-11 DIAGNOSIS — I350 Nonrheumatic aortic (valve) stenosis: Secondary | ICD-10-CM | POA: Diagnosis not present

## 2022-07-11 DIAGNOSIS — E1142 Type 2 diabetes mellitus with diabetic polyneuropathy: Secondary | ICD-10-CM | POA: Diagnosis not present

## 2022-07-11 DIAGNOSIS — Z7984 Long term (current) use of oral hypoglycemic drugs: Secondary | ICD-10-CM | POA: Diagnosis not present

## 2022-07-11 DIAGNOSIS — E1122 Type 2 diabetes mellitus with diabetic chronic kidney disease: Secondary | ICD-10-CM | POA: Diagnosis not present

## 2022-07-11 DIAGNOSIS — R29898 Other symptoms and signs involving the musculoskeletal system: Secondary | ICD-10-CM | POA: Diagnosis not present

## 2022-07-11 DIAGNOSIS — I130 Hypertensive heart and chronic kidney disease with heart failure and stage 1 through stage 4 chronic kidney disease, or unspecified chronic kidney disease: Secondary | ICD-10-CM | POA: Diagnosis not present

## 2022-07-11 DIAGNOSIS — I480 Paroxysmal atrial fibrillation: Secondary | ICD-10-CM | POA: Diagnosis not present

## 2022-07-11 DIAGNOSIS — I5032 Chronic diastolic (congestive) heart failure: Secondary | ICD-10-CM | POA: Diagnosis not present

## 2022-07-19 DIAGNOSIS — H1820 Unspecified corneal edema: Secondary | ICD-10-CM | POA: Diagnosis not present

## 2022-07-20 DIAGNOSIS — I5032 Chronic diastolic (congestive) heart failure: Secondary | ICD-10-CM | POA: Diagnosis not present

## 2022-07-20 DIAGNOSIS — I480 Paroxysmal atrial fibrillation: Secondary | ICD-10-CM | POA: Diagnosis not present

## 2022-07-20 DIAGNOSIS — Z7984 Long term (current) use of oral hypoglycemic drugs: Secondary | ICD-10-CM | POA: Diagnosis not present

## 2022-07-20 DIAGNOSIS — R29898 Other symptoms and signs involving the musculoskeletal system: Secondary | ICD-10-CM | POA: Diagnosis not present

## 2022-07-20 DIAGNOSIS — Z7901 Long term (current) use of anticoagulants: Secondary | ICD-10-CM | POA: Diagnosis not present

## 2022-07-20 DIAGNOSIS — I130 Hypertensive heart and chronic kidney disease with heart failure and stage 1 through stage 4 chronic kidney disease, or unspecified chronic kidney disease: Secondary | ICD-10-CM | POA: Diagnosis not present

## 2022-07-20 DIAGNOSIS — N1832 Chronic kidney disease, stage 3b: Secondary | ICD-10-CM | POA: Diagnosis not present

## 2022-07-20 DIAGNOSIS — E1122 Type 2 diabetes mellitus with diabetic chronic kidney disease: Secondary | ICD-10-CM | POA: Diagnosis not present

## 2022-07-20 DIAGNOSIS — E1142 Type 2 diabetes mellitus with diabetic polyneuropathy: Secondary | ICD-10-CM | POA: Diagnosis not present

## 2022-07-20 DIAGNOSIS — I350 Nonrheumatic aortic (valve) stenosis: Secondary | ICD-10-CM | POA: Diagnosis not present

## 2022-07-27 DIAGNOSIS — R29898 Other symptoms and signs involving the musculoskeletal system: Secondary | ICD-10-CM | POA: Diagnosis not present

## 2022-07-27 DIAGNOSIS — I350 Nonrheumatic aortic (valve) stenosis: Secondary | ICD-10-CM | POA: Diagnosis not present

## 2022-07-27 DIAGNOSIS — E1122 Type 2 diabetes mellitus with diabetic chronic kidney disease: Secondary | ICD-10-CM | POA: Diagnosis not present

## 2022-07-27 DIAGNOSIS — N1832 Chronic kidney disease, stage 3b: Secondary | ICD-10-CM | POA: Diagnosis not present

## 2022-07-27 DIAGNOSIS — E1142 Type 2 diabetes mellitus with diabetic polyneuropathy: Secondary | ICD-10-CM | POA: Diagnosis not present

## 2022-07-27 DIAGNOSIS — I5032 Chronic diastolic (congestive) heart failure: Secondary | ICD-10-CM | POA: Diagnosis not present

## 2022-07-27 DIAGNOSIS — Z7901 Long term (current) use of anticoagulants: Secondary | ICD-10-CM | POA: Diagnosis not present

## 2022-07-27 DIAGNOSIS — I130 Hypertensive heart and chronic kidney disease with heart failure and stage 1 through stage 4 chronic kidney disease, or unspecified chronic kidney disease: Secondary | ICD-10-CM | POA: Diagnosis not present

## 2022-07-27 DIAGNOSIS — Z7984 Long term (current) use of oral hypoglycemic drugs: Secondary | ICD-10-CM | POA: Diagnosis not present

## 2022-07-27 DIAGNOSIS — I480 Paroxysmal atrial fibrillation: Secondary | ICD-10-CM | POA: Diagnosis not present

## 2022-08-03 DIAGNOSIS — H1820 Unspecified corneal edema: Secondary | ICD-10-CM | POA: Diagnosis not present

## 2022-08-03 DIAGNOSIS — H1812 Bullous keratopathy, left eye: Secondary | ICD-10-CM | POA: Diagnosis not present

## 2022-08-03 DIAGNOSIS — Z7901 Long term (current) use of anticoagulants: Secondary | ICD-10-CM | POA: Diagnosis not present

## 2022-08-03 DIAGNOSIS — I480 Paroxysmal atrial fibrillation: Secondary | ICD-10-CM | POA: Diagnosis not present

## 2022-08-03 DIAGNOSIS — E1122 Type 2 diabetes mellitus with diabetic chronic kidney disease: Secondary | ICD-10-CM | POA: Diagnosis not present

## 2022-08-03 DIAGNOSIS — G4733 Obstructive sleep apnea (adult) (pediatric): Secondary | ICD-10-CM | POA: Diagnosis not present

## 2022-08-03 DIAGNOSIS — I129 Hypertensive chronic kidney disease with stage 1 through stage 4 chronic kidney disease, or unspecified chronic kidney disease: Secondary | ICD-10-CM | POA: Diagnosis not present

## 2022-08-03 DIAGNOSIS — N183 Chronic kidney disease, stage 3 unspecified: Secondary | ICD-10-CM | POA: Diagnosis not present

## 2022-08-03 DIAGNOSIS — Z7984 Long term (current) use of oral hypoglycemic drugs: Secondary | ICD-10-CM | POA: Diagnosis not present

## 2022-08-11 DIAGNOSIS — H401133 Primary open-angle glaucoma, bilateral, severe stage: Secondary | ICD-10-CM | POA: Diagnosis not present

## 2022-08-11 DIAGNOSIS — Z961 Presence of intraocular lens: Secondary | ICD-10-CM | POA: Diagnosis not present

## 2022-08-11 DIAGNOSIS — Z947 Corneal transplant status: Secondary | ICD-10-CM | POA: Diagnosis not present

## 2022-08-11 DIAGNOSIS — E119 Type 2 diabetes mellitus without complications: Secondary | ICD-10-CM | POA: Diagnosis not present

## 2022-08-18 DIAGNOSIS — N1832 Chronic kidney disease, stage 3b: Secondary | ICD-10-CM | POA: Diagnosis not present

## 2022-08-18 DIAGNOSIS — E1142 Type 2 diabetes mellitus with diabetic polyneuropathy: Secondary | ICD-10-CM | POA: Diagnosis not present

## 2022-08-18 DIAGNOSIS — Z7984 Long term (current) use of oral hypoglycemic drugs: Secondary | ICD-10-CM | POA: Diagnosis not present

## 2022-08-18 DIAGNOSIS — I5032 Chronic diastolic (congestive) heart failure: Secondary | ICD-10-CM | POA: Diagnosis not present

## 2022-08-18 DIAGNOSIS — I350 Nonrheumatic aortic (valve) stenosis: Secondary | ICD-10-CM | POA: Diagnosis not present

## 2022-08-18 DIAGNOSIS — E1122 Type 2 diabetes mellitus with diabetic chronic kidney disease: Secondary | ICD-10-CM | POA: Diagnosis not present

## 2022-08-18 DIAGNOSIS — Z7901 Long term (current) use of anticoagulants: Secondary | ICD-10-CM | POA: Diagnosis not present

## 2022-08-18 DIAGNOSIS — R29898 Other symptoms and signs involving the musculoskeletal system: Secondary | ICD-10-CM | POA: Diagnosis not present

## 2022-08-18 DIAGNOSIS — I480 Paroxysmal atrial fibrillation: Secondary | ICD-10-CM | POA: Diagnosis not present

## 2022-08-18 DIAGNOSIS — I130 Hypertensive heart and chronic kidney disease with heart failure and stage 1 through stage 4 chronic kidney disease, or unspecified chronic kidney disease: Secondary | ICD-10-CM | POA: Diagnosis not present

## 2022-09-06 DIAGNOSIS — E119 Type 2 diabetes mellitus without complications: Secondary | ICD-10-CM | POA: Diagnosis not present

## 2022-09-06 DIAGNOSIS — Z961 Presence of intraocular lens: Secondary | ICD-10-CM | POA: Diagnosis not present

## 2022-09-06 DIAGNOSIS — Z947 Corneal transplant status: Secondary | ICD-10-CM | POA: Diagnosis not present

## 2022-09-06 DIAGNOSIS — H401133 Primary open-angle glaucoma, bilateral, severe stage: Secondary | ICD-10-CM | POA: Diagnosis not present

## 2022-10-05 DIAGNOSIS — H1820 Unspecified corneal edema: Secondary | ICD-10-CM | POA: Diagnosis not present

## 2022-10-05 DIAGNOSIS — I129 Hypertensive chronic kidney disease with stage 1 through stage 4 chronic kidney disease, or unspecified chronic kidney disease: Secondary | ICD-10-CM | POA: Diagnosis not present

## 2022-10-05 DIAGNOSIS — N1831 Chronic kidney disease, stage 3a: Secondary | ICD-10-CM | POA: Diagnosis not present

## 2022-10-05 DIAGNOSIS — Z7984 Long term (current) use of oral hypoglycemic drugs: Secondary | ICD-10-CM | POA: Diagnosis not present

## 2022-10-05 DIAGNOSIS — H18062 Stromal corneal pigmentations, left eye: Secondary | ICD-10-CM | POA: Diagnosis not present

## 2022-10-05 DIAGNOSIS — T868412 Corneal transplant failure, left eye: Secondary | ICD-10-CM | POA: Diagnosis not present

## 2022-10-05 DIAGNOSIS — Z7901 Long term (current) use of anticoagulants: Secondary | ICD-10-CM | POA: Diagnosis not present

## 2022-10-05 DIAGNOSIS — Z79899 Other long term (current) drug therapy: Secondary | ICD-10-CM | POA: Diagnosis not present

## 2022-10-05 DIAGNOSIS — I480 Paroxysmal atrial fibrillation: Secondary | ICD-10-CM | POA: Diagnosis not present

## 2022-10-05 DIAGNOSIS — Z87891 Personal history of nicotine dependence: Secondary | ICD-10-CM | POA: Diagnosis not present

## 2022-10-05 DIAGNOSIS — H18332 Rupture in Descemet's membrane, left eye: Secondary | ICD-10-CM | POA: Diagnosis not present

## 2022-11-01 DIAGNOSIS — E1122 Type 2 diabetes mellitus with diabetic chronic kidney disease: Secondary | ICD-10-CM | POA: Diagnosis not present

## 2022-11-01 DIAGNOSIS — G4733 Obstructive sleep apnea (adult) (pediatric): Secondary | ICD-10-CM | POA: Diagnosis not present

## 2022-11-01 DIAGNOSIS — D6869 Other thrombophilia: Secondary | ICD-10-CM | POA: Diagnosis not present

## 2022-11-01 DIAGNOSIS — M24541 Contracture, right hand: Secondary | ICD-10-CM | POA: Diagnosis not present

## 2022-11-01 DIAGNOSIS — Z0000 Encounter for general adult medical examination without abnormal findings: Secondary | ICD-10-CM | POA: Diagnosis not present

## 2022-11-01 DIAGNOSIS — I129 Hypertensive chronic kidney disease with stage 1 through stage 4 chronic kidney disease, or unspecified chronic kidney disease: Secondary | ICD-10-CM | POA: Diagnosis not present

## 2022-11-01 DIAGNOSIS — Z79899 Other long term (current) drug therapy: Secondary | ICD-10-CM | POA: Diagnosis not present

## 2022-11-01 DIAGNOSIS — G8929 Other chronic pain: Secondary | ICD-10-CM | POA: Diagnosis not present

## 2022-11-01 DIAGNOSIS — N1832 Chronic kidney disease, stage 3b: Secondary | ICD-10-CM | POA: Diagnosis not present

## 2022-11-01 DIAGNOSIS — E1142 Type 2 diabetes mellitus with diabetic polyneuropathy: Secondary | ICD-10-CM | POA: Diagnosis not present

## 2022-11-01 DIAGNOSIS — I480 Paroxysmal atrial fibrillation: Secondary | ICD-10-CM | POA: Diagnosis not present

## 2022-11-01 DIAGNOSIS — Z23 Encounter for immunization: Secondary | ICD-10-CM | POA: Diagnosis not present

## 2022-11-09 DIAGNOSIS — M170 Bilateral primary osteoarthritis of knee: Secondary | ICD-10-CM | POA: Diagnosis not present

## 2022-11-09 DIAGNOSIS — M1711 Unilateral primary osteoarthritis, right knee: Secondary | ICD-10-CM | POA: Diagnosis not present

## 2022-11-09 DIAGNOSIS — M1712 Unilateral primary osteoarthritis, left knee: Secondary | ICD-10-CM | POA: Diagnosis not present

## 2022-11-09 DIAGNOSIS — M25572 Pain in left ankle and joints of left foot: Secondary | ICD-10-CM | POA: Diagnosis not present

## 2022-11-09 DIAGNOSIS — M25571 Pain in right ankle and joints of right foot: Secondary | ICD-10-CM | POA: Diagnosis not present

## 2022-11-14 DIAGNOSIS — M79641 Pain in right hand: Secondary | ICD-10-CM | POA: Diagnosis not present

## 2023-02-15 DIAGNOSIS — L03116 Cellulitis of left lower limb: Secondary | ICD-10-CM | POA: Diagnosis not present

## 2023-02-15 DIAGNOSIS — S91302A Unspecified open wound, left foot, initial encounter: Secondary | ICD-10-CM | POA: Diagnosis not present

## 2023-02-21 DIAGNOSIS — Z556 Problems related to health literacy: Secondary | ICD-10-CM | POA: Diagnosis not present

## 2023-02-21 DIAGNOSIS — E11621 Type 2 diabetes mellitus with foot ulcer: Secondary | ICD-10-CM | POA: Diagnosis not present

## 2023-02-21 DIAGNOSIS — Z993 Dependence on wheelchair: Secondary | ICD-10-CM | POA: Diagnosis not present

## 2023-02-21 DIAGNOSIS — N189 Chronic kidney disease, unspecified: Secondary | ICD-10-CM | POA: Diagnosis not present

## 2023-02-21 DIAGNOSIS — I129 Hypertensive chronic kidney disease with stage 1 through stage 4 chronic kidney disease, or unspecified chronic kidney disease: Secondary | ICD-10-CM | POA: Diagnosis not present

## 2023-02-21 DIAGNOSIS — E1151 Type 2 diabetes mellitus with diabetic peripheral angiopathy without gangrene: Secondary | ICD-10-CM | POA: Diagnosis not present

## 2023-02-21 DIAGNOSIS — I480 Paroxysmal atrial fibrillation: Secondary | ICD-10-CM | POA: Diagnosis not present

## 2023-02-21 DIAGNOSIS — G4733 Obstructive sleep apnea (adult) (pediatric): Secondary | ICD-10-CM | POA: Diagnosis not present

## 2023-02-21 DIAGNOSIS — I872 Venous insufficiency (chronic) (peripheral): Secondary | ICD-10-CM | POA: Diagnosis not present

## 2023-02-21 DIAGNOSIS — E1122 Type 2 diabetes mellitus with diabetic chronic kidney disease: Secondary | ICD-10-CM | POA: Diagnosis not present

## 2023-02-21 DIAGNOSIS — Z7985 Long-term (current) use of injectable non-insulin antidiabetic drugs: Secondary | ICD-10-CM | POA: Diagnosis not present

## 2023-02-21 DIAGNOSIS — Z7984 Long term (current) use of oral hypoglycemic drugs: Secondary | ICD-10-CM | POA: Diagnosis not present

## 2023-02-21 DIAGNOSIS — Z7901 Long term (current) use of anticoagulants: Secondary | ICD-10-CM | POA: Diagnosis not present

## 2023-02-21 DIAGNOSIS — E1142 Type 2 diabetes mellitus with diabetic polyneuropathy: Secondary | ICD-10-CM | POA: Diagnosis not present

## 2023-02-21 DIAGNOSIS — L97522 Non-pressure chronic ulcer of other part of left foot with fat layer exposed: Secondary | ICD-10-CM | POA: Diagnosis not present

## 2023-02-21 DIAGNOSIS — Z87891 Personal history of nicotine dependence: Secondary | ICD-10-CM | POA: Diagnosis not present

## 2023-02-21 DIAGNOSIS — L03116 Cellulitis of left lower limb: Secondary | ICD-10-CM | POA: Diagnosis not present

## 2023-02-21 DIAGNOSIS — G8929 Other chronic pain: Secondary | ICD-10-CM | POA: Diagnosis not present

## 2023-02-21 DIAGNOSIS — I350 Nonrheumatic aortic (valve) stenosis: Secondary | ICD-10-CM | POA: Diagnosis not present

## 2023-02-27 ENCOUNTER — Ambulatory Visit
Admission: RE | Admit: 2023-02-27 | Discharge: 2023-02-27 | Disposition: A | Discharge status: Home / Self Care | Payer: Medicare Other | Source: Ambulatory Visit | Attending: Internal Medicine | Admitting: Internal Medicine

## 2023-02-27 ENCOUNTER — Other Ambulatory Visit: Payer: Self-pay | Admitting: Internal Medicine

## 2023-02-27 DIAGNOSIS — S91302A Unspecified open wound, left foot, initial encounter: Secondary | ICD-10-CM | POA: Diagnosis not present

## 2023-02-27 DIAGNOSIS — M8610 Other acute osteomyelitis, unspecified site: Secondary | ICD-10-CM | POA: Diagnosis not present

## 2023-02-27 DIAGNOSIS — Z79899 Other long term (current) drug therapy: Secondary | ICD-10-CM | POA: Diagnosis not present

## 2023-02-27 DIAGNOSIS — L03116 Cellulitis of left lower limb: Secondary | ICD-10-CM | POA: Diagnosis not present

## 2023-02-27 DIAGNOSIS — S91302S Unspecified open wound, left foot, sequela: Secondary | ICD-10-CM | POA: Diagnosis not present

## 2023-02-27 DIAGNOSIS — S91302D Unspecified open wound, left foot, subsequent encounter: Secondary | ICD-10-CM | POA: Diagnosis not present

## 2023-02-27 DIAGNOSIS — M869 Osteomyelitis, unspecified: Secondary | ICD-10-CM | POA: Diagnosis not present

## 2023-03-02 DIAGNOSIS — N189 Chronic kidney disease, unspecified: Secondary | ICD-10-CM | POA: Diagnosis not present

## 2023-03-02 DIAGNOSIS — L97522 Non-pressure chronic ulcer of other part of left foot with fat layer exposed: Secondary | ICD-10-CM | POA: Diagnosis not present

## 2023-03-02 DIAGNOSIS — G8929 Other chronic pain: Secondary | ICD-10-CM | POA: Diagnosis not present

## 2023-03-02 DIAGNOSIS — I129 Hypertensive chronic kidney disease with stage 1 through stage 4 chronic kidney disease, or unspecified chronic kidney disease: Secondary | ICD-10-CM | POA: Diagnosis not present

## 2023-03-02 DIAGNOSIS — E11621 Type 2 diabetes mellitus with foot ulcer: Secondary | ICD-10-CM | POA: Diagnosis not present

## 2023-03-02 DIAGNOSIS — I872 Venous insufficiency (chronic) (peripheral): Secondary | ICD-10-CM | POA: Diagnosis not present

## 2023-03-02 DIAGNOSIS — G4733 Obstructive sleep apnea (adult) (pediatric): Secondary | ICD-10-CM | POA: Diagnosis not present

## 2023-03-02 DIAGNOSIS — Z7985 Long-term (current) use of injectable non-insulin antidiabetic drugs: Secondary | ICD-10-CM | POA: Diagnosis not present

## 2023-03-02 DIAGNOSIS — Z993 Dependence on wheelchair: Secondary | ICD-10-CM | POA: Diagnosis not present

## 2023-03-02 DIAGNOSIS — I350 Nonrheumatic aortic (valve) stenosis: Secondary | ICD-10-CM | POA: Diagnosis not present

## 2023-03-02 DIAGNOSIS — Z556 Problems related to health literacy: Secondary | ICD-10-CM | POA: Diagnosis not present

## 2023-03-02 DIAGNOSIS — L03116 Cellulitis of left lower limb: Secondary | ICD-10-CM | POA: Diagnosis not present

## 2023-03-02 DIAGNOSIS — E1142 Type 2 diabetes mellitus with diabetic polyneuropathy: Secondary | ICD-10-CM | POA: Diagnosis not present

## 2023-03-02 DIAGNOSIS — I480 Paroxysmal atrial fibrillation: Secondary | ICD-10-CM | POA: Diagnosis not present

## 2023-03-02 DIAGNOSIS — E1122 Type 2 diabetes mellitus with diabetic chronic kidney disease: Secondary | ICD-10-CM | POA: Diagnosis not present

## 2023-03-02 DIAGNOSIS — Z7984 Long term (current) use of oral hypoglycemic drugs: Secondary | ICD-10-CM | POA: Diagnosis not present

## 2023-03-02 DIAGNOSIS — Z7901 Long term (current) use of anticoagulants: Secondary | ICD-10-CM | POA: Diagnosis not present

## 2023-03-02 DIAGNOSIS — E1151 Type 2 diabetes mellitus with diabetic peripheral angiopathy without gangrene: Secondary | ICD-10-CM | POA: Diagnosis not present

## 2023-03-02 DIAGNOSIS — Z87891 Personal history of nicotine dependence: Secondary | ICD-10-CM | POA: Diagnosis not present

## 2023-03-07 DIAGNOSIS — E1151 Type 2 diabetes mellitus with diabetic peripheral angiopathy without gangrene: Secondary | ICD-10-CM | POA: Diagnosis not present

## 2023-03-07 DIAGNOSIS — Z556 Problems related to health literacy: Secondary | ICD-10-CM | POA: Diagnosis not present

## 2023-03-07 DIAGNOSIS — G8929 Other chronic pain: Secondary | ICD-10-CM | POA: Diagnosis not present

## 2023-03-07 DIAGNOSIS — L03116 Cellulitis of left lower limb: Secondary | ICD-10-CM | POA: Diagnosis not present

## 2023-03-07 DIAGNOSIS — Z7985 Long-term (current) use of injectable non-insulin antidiabetic drugs: Secondary | ICD-10-CM | POA: Diagnosis not present

## 2023-03-07 DIAGNOSIS — E1122 Type 2 diabetes mellitus with diabetic chronic kidney disease: Secondary | ICD-10-CM | POA: Diagnosis not present

## 2023-03-07 DIAGNOSIS — N189 Chronic kidney disease, unspecified: Secondary | ICD-10-CM | POA: Diagnosis not present

## 2023-03-07 DIAGNOSIS — I129 Hypertensive chronic kidney disease with stage 1 through stage 4 chronic kidney disease, or unspecified chronic kidney disease: Secondary | ICD-10-CM | POA: Diagnosis not present

## 2023-03-07 DIAGNOSIS — Z7901 Long term (current) use of anticoagulants: Secondary | ICD-10-CM | POA: Diagnosis not present

## 2023-03-07 DIAGNOSIS — E1142 Type 2 diabetes mellitus with diabetic polyneuropathy: Secondary | ICD-10-CM | POA: Diagnosis not present

## 2023-03-07 DIAGNOSIS — E11621 Type 2 diabetes mellitus with foot ulcer: Secondary | ICD-10-CM | POA: Diagnosis not present

## 2023-03-07 DIAGNOSIS — Z87891 Personal history of nicotine dependence: Secondary | ICD-10-CM | POA: Diagnosis not present

## 2023-03-07 DIAGNOSIS — Z7984 Long term (current) use of oral hypoglycemic drugs: Secondary | ICD-10-CM | POA: Diagnosis not present

## 2023-03-07 DIAGNOSIS — I480 Paroxysmal atrial fibrillation: Secondary | ICD-10-CM | POA: Diagnosis not present

## 2023-03-07 DIAGNOSIS — L97522 Non-pressure chronic ulcer of other part of left foot with fat layer exposed: Secondary | ICD-10-CM | POA: Diagnosis not present

## 2023-03-07 DIAGNOSIS — Z993 Dependence on wheelchair: Secondary | ICD-10-CM | POA: Diagnosis not present

## 2023-03-07 DIAGNOSIS — I350 Nonrheumatic aortic (valve) stenosis: Secondary | ICD-10-CM | POA: Diagnosis not present

## 2023-03-07 DIAGNOSIS — G4733 Obstructive sleep apnea (adult) (pediatric): Secondary | ICD-10-CM | POA: Diagnosis not present

## 2023-03-07 DIAGNOSIS — I872 Venous insufficiency (chronic) (peripheral): Secondary | ICD-10-CM | POA: Diagnosis not present

## 2023-03-09 ENCOUNTER — Ambulatory Visit: Payer: Medicare Other | Admitting: Family

## 2023-03-14 DIAGNOSIS — L03116 Cellulitis of left lower limb: Secondary | ICD-10-CM | POA: Diagnosis not present

## 2023-03-14 DIAGNOSIS — E1142 Type 2 diabetes mellitus with diabetic polyneuropathy: Secondary | ICD-10-CM | POA: Diagnosis not present

## 2023-03-14 DIAGNOSIS — Z993 Dependence on wheelchair: Secondary | ICD-10-CM | POA: Diagnosis not present

## 2023-03-14 DIAGNOSIS — Z7985 Long-term (current) use of injectable non-insulin antidiabetic drugs: Secondary | ICD-10-CM | POA: Diagnosis not present

## 2023-03-14 DIAGNOSIS — Z7901 Long term (current) use of anticoagulants: Secondary | ICD-10-CM | POA: Diagnosis not present

## 2023-03-14 DIAGNOSIS — E1122 Type 2 diabetes mellitus with diabetic chronic kidney disease: Secondary | ICD-10-CM | POA: Diagnosis not present

## 2023-03-14 DIAGNOSIS — L97522 Non-pressure chronic ulcer of other part of left foot with fat layer exposed: Secondary | ICD-10-CM | POA: Diagnosis not present

## 2023-03-14 DIAGNOSIS — Z87891 Personal history of nicotine dependence: Secondary | ICD-10-CM | POA: Diagnosis not present

## 2023-03-14 DIAGNOSIS — Z556 Problems related to health literacy: Secondary | ICD-10-CM | POA: Diagnosis not present

## 2023-03-14 DIAGNOSIS — I872 Venous insufficiency (chronic) (peripheral): Secondary | ICD-10-CM | POA: Diagnosis not present

## 2023-03-14 DIAGNOSIS — I480 Paroxysmal atrial fibrillation: Secondary | ICD-10-CM | POA: Diagnosis not present

## 2023-03-14 DIAGNOSIS — N189 Chronic kidney disease, unspecified: Secondary | ICD-10-CM | POA: Diagnosis not present

## 2023-03-14 DIAGNOSIS — E1151 Type 2 diabetes mellitus with diabetic peripheral angiopathy without gangrene: Secondary | ICD-10-CM | POA: Diagnosis not present

## 2023-03-14 DIAGNOSIS — Z7984 Long term (current) use of oral hypoglycemic drugs: Secondary | ICD-10-CM | POA: Diagnosis not present

## 2023-03-14 DIAGNOSIS — G8929 Other chronic pain: Secondary | ICD-10-CM | POA: Diagnosis not present

## 2023-03-14 DIAGNOSIS — I129 Hypertensive chronic kidney disease with stage 1 through stage 4 chronic kidney disease, or unspecified chronic kidney disease: Secondary | ICD-10-CM | POA: Diagnosis not present

## 2023-03-14 DIAGNOSIS — I350 Nonrheumatic aortic (valve) stenosis: Secondary | ICD-10-CM | POA: Diagnosis not present

## 2023-03-14 DIAGNOSIS — G4733 Obstructive sleep apnea (adult) (pediatric): Secondary | ICD-10-CM | POA: Diagnosis not present

## 2023-03-14 DIAGNOSIS — E11621 Type 2 diabetes mellitus with foot ulcer: Secondary | ICD-10-CM | POA: Diagnosis not present

## 2023-03-15 ENCOUNTER — Ambulatory Visit: Payer: Medicare Other | Admitting: Orthopedic Surgery

## 2023-03-15 ENCOUNTER — Encounter: Payer: Self-pay | Admitting: Orthopedic Surgery

## 2023-03-15 ENCOUNTER — Encounter (HOSPITAL_COMMUNITY): Payer: Self-pay | Admitting: Orthopedic Surgery

## 2023-03-15 ENCOUNTER — Other Ambulatory Visit: Payer: Self-pay

## 2023-03-15 DIAGNOSIS — M869 Osteomyelitis, unspecified: Secondary | ICD-10-CM | POA: Diagnosis not present

## 2023-03-15 NOTE — Progress Notes (Signed)
 Anesthesia Chart Review: SAME DAY WORK-UP  Case: [id number] Date/Time: 03/16/23 1110   Procedure: AMPUTATION, FOOT, RAY (Left) - LEFT FOOT 5TH RAY AMPUTATION 28810   Anesthesia type: Choice   Pre-op diagnosis: Osteomyelitis Left Foot 5th Metatarsal   [facility]   Surgeons: Nadara Mustard, MD       DISCUSSION: Patient is an 83 year old male scheduled for the above procedure. He was added to OR scheduled after 03/15/23 visit with Dr. Lajoyce Corners for initial evaluation for left foot osteomyelitis fifth metatarsal head.    History includes former smoker, HTN, DM2, neuropathy, PAF (01/2020 in setting of AKI/dehydration), AS (mild-moderate 07/2019), hypertriglyceridemia, anemia, venous insufficiency, junctional tachycardia, glaucoma (s/p surgery), corneal implant, osteomyelitis (right ring finger ray amputation 04/10/20; right index finder (s/p right index finger amputation through proximal phalanx 08/30/21), GI bleed (02/2002). Notes suggest remote sleep study showed no significant OSA (report not available).   He saw cardiologist Dr. Rollene Rotunda once in July 2021 for dyspnea. He had a non-ischemic stress test in 09/2019. Echo in 07/2019 showed LVEF 55-60%, no RWMA, grade 1 DD, normal RV systolic function, trivial MR, AV with indeterminate number of cusps, mild to moderate AS (AVA VTI 1.37 cm, AV mean gradient 15.0 mmHg, AV Vmax 2.56 m/s), ascending aorta 42 mm. Echo felt overall stable since 2015. It appears follow-up was through primary care. He was started on Eliquis in 01/2020 for PAF during admission for weakness and falls and found to have AKI/dehydration.  As above, add-on after evaluation on 03/15/23. Last Eliquis 03/15/23 and last Trulicity was 03/10/23. Anesthesia team to evaluate on the day of surgery.   VS:  BP Readings from Last 3 Encounters:  08/31/21 (!) 146/62  02/27/21 (!) 141/64  04/14/20 139/74   Pulse Readings from Last 3 Encounters:  08/31/21 66  02/27/21 (!) 54  04/14/20 65     PROVIDERS: Kirby Funk, MD (Inactive). Evaluated by Hillard Danker, MD on 02/27/23.    LABS: For day of surgery as indicated.   IMAGES: Xray left foot 02/27/23: IMPRESSION: Findings concerning for osteomyelitis of the base of the proximal phalanx of the fifth digit.    EKG: Last EKG noted 02/25/21: Sinus rhythm at 57 bpm Probable left ventricular hypertrophy No significant change since last tracing Confirmed by Alvira Monday ([id number]) on 02/25/2021 12:23:17 PM  CV: Nuclear stress test 09/11/19: Nuclear stress EF: 51%. There are no wall motion abnormalities. Echocardiogram ejection fraction 55% for comparison. There was no ST segment deviation noted during stress. The study is normal. This is a low risk study. No evidence of ischemia or infarction.   Echo 07/30/19: IMPRESSIONS   1. Left ventricular ejection fraction, by estimation, is 55 to 60%. The  left ventricle has normal function. The left ventricle has no regional  wall motion abnormalities. Left ventricular diastolic parameters are  consistent with Grade I diastolic  dysfunction (impaired relaxation). The average left ventricular global  longitudinal strain is -21.4 %. The global longitudinal strain is normal.   2. Right ventricular systolic function is normal. The right ventricular  size is normal. Tricuspid regurgitation signal is inadequate for assessing  PA pressure.   3. The mitral valve is degenerative. Trivial mitral valve regurgitation.  No evidence of mitral stenosis.   4. The aortic valve has an indeterminant number of cusps. Aortic valve  regurgitation is not visualized. Mild to moderate aortic valve stenosis.  Aortic valve area, by VTI measures 1.37 cm. Aortic valve  mean gradient  measures 15.0 mmHg. Aortic valve Vmax   measures 2.56 m/s.   5. There is mild dilatation of the ascending aorta measuring 42 mm.   Comparison(s): No significant change from prior study. 08/07/13 EF 60-65%.  Mild AS  mean PG, peak.    Past Medical History:  Diagnosis Date   Adenomatous colon polyp    Allergic rhinitis    Anemia    Aortic stenosis 07/30/2019   mild to moderate AS (AVA VTI 1.37 cm, AV mean gradient 15.0 mmHg, AV Vmax 2.56 m/s)   BMI 40.0-44.9, adult (HCC)    Chronic insomnia    Chronic low back pain    Constipation, unspecified    Diabetes mellitus (HCC) 2015   DJD (degenerative joint disease) of knee    Hypercalcemia    Hypertension    Hypertriglyceridemia    Junctional tachycardia (HCC)    Lumbar facet arthropathy    Morbid obesity (HCC)    Neuropathy    Nonallergic rhinitis    OSA (obstructive sleep apnea)    wife denies   Other chronic pain    PAF (paroxysmal atrial fibrillation) (HCC) 01/2020   Peripheral axonal neuropathy    Upper GI bleed    due to aspirin   Venous insufficiency    Venous stasis dermatitis of both lower extremities     Past Surgical History:  Procedure Laterality Date   AMPUTATION Right 08/30/2021   Procedure: Right index finger Amputaion;  Surgeon: Marlyne Beards, MD;  [facility];  Service: Orthopedics;  Laterality: Right;   cataract     Bilateral   CORNEAL TRANSPLANT Bilateral    GLAUCOMA SURGERY     HAND SURGERY Left    s/p amputation of left finger   I & D EXTREMITY Right 04/10/2020   Procedure: IRRIGATION AND DEBRIDEMENT AND AMPUTATION OF RIGHT RING FINGER;  Surgeon: Bradly Bienenstock, MD;  [facility];  Service: Orthopedics;  Laterality: Right;    MEDICATIONS: No current facility-administered medications for this encounter.    acetaminophen (TYLENOL) 650 MG CR tablet   amLODipine (NORVASC) 5 MG tablet   apixaban (ELIQUIS) 5 MG TABS tablet   carvedilol (COREG) 6.25 MG tablet   Dulaglutide (TRULICITY) 1.5 MG/0.5ML SOPN   DULoxetine (CYMBALTA) 60 MG capsule   gabapentin (NEURONTIN) 600 MG tablet   metFORMIN (GLUCOPHAGE) 500 MG tablet   Multiple Vitamin (MULTIVITAMIN) tablet   oxyCODONE-acetaminophen  (PERCOCET/ROXICET) 5-325 MG tablet   prednisoLONE acetate (PRED FORTE) 1 % ophthalmic suspension   traZODone (DESYREL) 100 MG tablet    Shonna Chock, PA-C Surgical Short Stay/Anesthesiology [facility] Phone [phone] [facility] Phone [phone] 03/15/2023 4:27 PM

## 2023-03-15 NOTE — Progress Notes (Addendum)
 SDW CALL  Patient and wife were given pre-op instructions over the phone. The opportunity was given for the patient and wife to ask questions. No further questions asked. Patient's wife verbalized understanding of instructions given.   PCP - Forest Becker Cardiologist - denies but did see Dr. Rollene Rotunda on 07/11/19  PPM/ICD - denies Device Orders - n/a Rep Notified - n/a  Chest x-ray - denies EKG - DOS Stress Test - 09/11/19 ECHO - 07/30/19 Cardiac Cath - denies  Sleep Study - wife denies that patient has sleep apnea   Fasting Blood Sugar - 120-130 Checks Blood Sugar _1-2__ times a week  Last dose of GLP1 agonist-  March 1st GLP1 instructions: wife aware that patient does not need to take Trulicity  Blood Thinner Instructions: last dose of Eliquis was 3/6 Aspirin Instructions: n/a  ERAS Protcol - clears until 0815 PRE-SURGERY Ensure or G2- n/a  COVID TEST-  n/a   Anesthesia review:  yes   Patient denies shortness of breath, fever, cough and chest pain over the phone call   All instructions explained to the patient, with a verbal understanding of the material. Patient agrees to go over the instructions while at home for a better understanding.

## 2023-03-15 NOTE — Anesthesia Preprocedure Evaluation (Addendum)
 Anesthesia Evaluation  Patient identified by MRN, date of birth, ID band Patient awake    Reviewed: Allergy & Precautions, H&P , NPO status , Patient's Chart, lab work & pertinent test results, reviewed documented beta blocker date and time   Airway Mallampati: III  TM Distance: >3 FB Neck ROM: Full    Dental no notable dental hx. (+) Edentulous Upper, Edentulous Lower, Dental Advisory Given   Pulmonary sleep apnea , former smoker   Pulmonary exam normal breath sounds clear to auscultation       Cardiovascular hypertension, Pt. on medications and Pt. on home beta blockers + Valvular Problems/Murmurs AS  Rhythm:Regular Rate:Normal     Neuro/Psych negative neurological ROS  negative psych ROS   GI/Hepatic negative GI ROS, Neg liver ROS,,,  Endo/Other  diabetes, Type 2, Oral Hypoglycemic Agents    Renal/GU negative Renal ROS  negative genitourinary   Musculoskeletal  (+) Arthritis ,    Abdominal   Peds  Hematology  (+) Blood dyscrasia, anemia   Anesthesia Other Findings   Reproductive/Obstetrics negative OB ROS                             Anesthesia Physical Anesthesia Plan  ASA: 3  Anesthesia Plan: MAC   Post-op Pain Management: Minimal or no pain anticipated   Induction: Intravenous  PONV Risk Score and Plan: 2 and Ondansetron, Propofol infusion and Dexamethasone  Airway Management Planned: Natural Airway and Simple Face Mask  Additional Equipment:   Intra-op Plan:   Post-operative Plan:   Informed Consent: I have reviewed the patients History and Physical, chart, labs and discussed the procedure including the risks, benefits and alternatives for the proposed anesthesia with the patient or authorized representative who has indicated his/her understanding and acceptance.     Dental advisory given  Plan Discussed with: CRNA  Anesthesia Plan Comments: (PAT note written  03/15/2023 by Shonna Chock, PA-C.  )       Anesthesia Quick Evaluation

## 2023-03-15 NOTE — Progress Notes (Signed)
 Office Visit Note   Patient: [name]           Date of Birth: 03-Oct-1940           MRN: [id number] Visit Date: 03/15/2023              Requested by: Thana Ates, MD [address] [place],  [place] [address] PCP: Kirby Funk, MD (Inactive)  Chief Complaint  Patient presents with   Left Foot - Pain      HPI: Patient is a 83 year old gentleman who is seen for initial evaluation for left foot osteomyelitis fifth metatarsal head.  Patient is just completed a course of Augmentin and doxycycline.  Ulcer noticed at the end of January.  Patient reports a history of neuropathy.  Patient has ankle-brachial indices that is over 36 years old.  Assessment & Plan: Visit Diagnoses:  1. 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)     Plan: Will plan for a left foot fifth ray amputation outpatient.  Risk and benefits were discussed including risk of the wound not healing need for additional surgery.  Discussed the importance of minimizing weightbearing.  Follow-Up Instructions: Return in about 2 weeks (around 03/29/2023).   Ortho Exam  Patient is alert, oriented, no adenopathy, well-dressed, normal affect, normal respiratory effort. Examination of both feet patient has a palpable dorsalis pedis pulse bilaterally.  With the Doppler patient has a strong biphasic dorsalis pedis pulse.  Radiograph shows calcification of the arteries past the ankle.  Radiograph shows chronic destructive changes of the fifth metatarsal head left foot consistent with chronic osteomyelitis.  There is no ascending cellulitis.  There is a necrotic ulcer that probes to bone over the fifth metatarsal head.  Previous ankle-brachial indices showed triphasic flow.  Imaging: No results found. No images are attached to the encounter.  Labs: Lab Results  Component Value Date   HGBA1C 6.9 (H) 08/29/2021   HGBA1C 6.9 (H) 04/10/2020   HGBA1C 6.9 (H) 01/06/2020   ESRSEDRATE 47 (H) 08/29/2021    ESRSEDRATE 33 (H) 01/05/2020   CRP 1.9 (H) 08/29/2021   REPTSTATUS 03/02/2021 FINAL 02/25/2021   GRAMSTAIN  04/10/2020    FEW WBC PRESENT,BOTH PMN AND MONONUCLEAR NO ORGANISMS SEEN    CULT  02/25/2021    NO GROWTH 5 DAYS Performed at [facility], [address], [place], [place] [address]    [facility] STAPHYLOCOCCUS AUREUS 04/10/2020     Lab Results  Component Value Date   ALBUMIN 3.1 (L) 08/30/2021   ALBUMIN 3.0 (L) 02/27/2021   ALBUMIN 3.0 (L) 02/25/2021    Lab Results  Component Value Date   MG 1.9 08/30/2021   MG 2.2 04/11/2020   MG 1.9 01/13/2020   No results found for: "VD25OH"  No results found for: "PREALBUMIN"    Latest Ref Rng & Units 08/30/2021    3:54 AM 08/29/2021    2:40 PM 02/27/2021    2:56 AM  CBC EXTENDED  WBC 4.0 - 10.5 K/uL 9.1  10.2  10.2   RBC 4.22 - 5.81 MIL/uL 4.64  4.44  4.52   Hemoglobin 13.0 - 17.0 g/dL 82.9  56.2  13.0   HCT 39.0 - 52.0 % 42.3  40.0  42.4   Platelets 150 - 400 K/uL 225  251  227      There is no height or weight on file to calculate BMI.  Orders:  No orders of the defined types were  placed in this encounter.  No orders of the defined types were placed in this encounter.    Procedures: No procedures performed  Clinical Data: No additional findings.  ROS:  All other systems negative, except as noted in the HPI. Review of Systems  Objective: Vital Signs: There were no vitals taken for this visit.  Specialty Comments:  No specialty comments available.  PMFS History: Patient Active Problem List   Diagnosis Date Noted   Hand osteomyelitis, right (HCC) 08/29/2021   Chronic insomnia 02/25/2021   Chronic kidney disease, stage 3a (HCC) 02/25/2021   HLD (hyperlipidemia) 02/25/2021   Obstructive sleep apnea 02/25/2021   Encephalopathy acute 02/25/2021   Leukocytosis 02/25/2021   Infection of right hand 04/10/2020   Acute osteomyelitis of hand including fingers, right (HCC) 04/10/2020   PAF (paroxysmal  atrial fibrillation) (HCC) 04/10/2020   Weakness 01/06/2020   Orthostatic hypotension 01/06/2020   Type 2 diabetes mellitus with obesity (HCC) 01/06/2020   Glaucoma 01/06/2020   Tachycardia 07/10/2019   Essential hypertension 08/01/2013   Past Medical History:  Diagnosis Date   Adenomatous colon polyp    Allergic rhinitis    BMI 40.0-44.9, adult (HCC)    Chronic insomnia    Chronic low back pain    Constipation, unspecified    Diabetes mellitus (HCC) 2015   DJD (degenerative joint disease) of knee    Hypercalcemia    Hypertension    Hypertriglyceridemia    Junctional tachycardia (HCC)    Lumbar facet arthropathy    Morbid obesity (HCC)    Neuropathy    Nonallergic rhinitis    OSA (obstructive sleep apnea)    Other chronic pain    Peripheral axonal neuropathy    Upper GI bleed    due to aspirin   Venous insufficiency    Venous stasis dermatitis of both lower extremities     Family History  Problem Relation Age of Onset   Gout Father    Osteoarthritis Mother        Deceased, 74   Healthy Brother        x2   Healthy Son        x2    Past Surgical History:  Procedure Laterality Date   AMPUTATION Right 08/30/2021   Procedure: Right index finger Amputaion;  Surgeon: Marlyne Beards, MD;  [facility];  Service: Orthopedics;  Laterality: Right;   cataract     Bilateral   GLAUCOMA SURGERY     HAND SURGERY Left    s/p amputation of left finger   I & D EXTREMITY Right 04/10/2020   Procedure: IRRIGATION AND DEBRIDEMENT AND AMPUTATION OF RIGHT RING FINGER;  Surgeon: Bradly Bienenstock, MD;  [facility];  Service: Orthopedics;  Laterality: Right;   Social History   Occupational History   Not on file  Tobacco Use   Smoking status: Former   Smokeless tobacco: Never   Tobacco comments:    Quit 20 years ago.    Substance and Sexual Activity   Alcohol use: No   Drug use: No   Sexual activity: Not on file

## 2023-03-16 ENCOUNTER — Ambulatory Visit (HOSPITAL_COMMUNITY): Payer: Self-pay | Admitting: Vascular Surgery

## 2023-03-16 ENCOUNTER — Ambulatory Visit (HOSPITAL_COMMUNITY)
Admission: RE | Admit: 2023-03-16 | Discharge: 2023-03-16 | Disposition: A | Discharge status: Home / Self Care | Attending: Orthopedic Surgery | Admitting: Orthopedic Surgery

## 2023-03-16 ENCOUNTER — Encounter (HOSPITAL_COMMUNITY)
Admission: RE | Disposition: A | Discharge status: Home / Self Care | Payer: Self-pay | Source: Home / Self Care | Attending: Orthopedic Surgery

## 2023-03-16 ENCOUNTER — Other Ambulatory Visit: Payer: Self-pay

## 2023-03-16 DIAGNOSIS — L97519 Non-pressure chronic ulcer of other part of right foot with unspecified severity: Secondary | ICD-10-CM | POA: Diagnosis not present

## 2023-03-16 DIAGNOSIS — Z87891 Personal history of nicotine dependence: Secondary | ICD-10-CM | POA: Diagnosis not present

## 2023-03-16 DIAGNOSIS — N1831 Chronic kidney disease, stage 3a: Secondary | ICD-10-CM | POA: Diagnosis not present

## 2023-03-16 DIAGNOSIS — M869 Osteomyelitis, unspecified: Secondary | ICD-10-CM | POA: Diagnosis not present

## 2023-03-16 DIAGNOSIS — G4730 Sleep apnea, unspecified: Secondary | ICD-10-CM | POA: Diagnosis not present

## 2023-03-16 DIAGNOSIS — E119 Type 2 diabetes mellitus without complications: Secondary | ICD-10-CM | POA: Diagnosis not present

## 2023-03-16 DIAGNOSIS — Z7984 Long term (current) use of oral hypoglycemic drugs: Secondary | ICD-10-CM | POA: Diagnosis not present

## 2023-03-16 DIAGNOSIS — M1990 Unspecified osteoarthritis, unspecified site: Secondary | ICD-10-CM | POA: Diagnosis not present

## 2023-03-16 DIAGNOSIS — E11621 Type 2 diabetes mellitus with foot ulcer: Secondary | ICD-10-CM | POA: Insufficient documentation

## 2023-03-16 DIAGNOSIS — E1169 Type 2 diabetes mellitus with other specified complication: Secondary | ICD-10-CM

## 2023-03-16 DIAGNOSIS — Z6832 Body mass index (BMI) 32.0-32.9, adult: Secondary | ICD-10-CM | POA: Insufficient documentation

## 2023-03-16 DIAGNOSIS — I872 Venous insufficiency (chronic) (peripheral): Secondary | ICD-10-CM | POA: Insufficient documentation

## 2023-03-16 DIAGNOSIS — G4733 Obstructive sleep apnea (adult) (pediatric): Secondary | ICD-10-CM | POA: Diagnosis not present

## 2023-03-16 DIAGNOSIS — I10 Essential (primary) hypertension: Secondary | ICD-10-CM | POA: Insufficient documentation

## 2023-03-16 DIAGNOSIS — D649 Anemia, unspecified: Secondary | ICD-10-CM | POA: Diagnosis not present

## 2023-03-16 DIAGNOSIS — I129 Hypertensive chronic kidney disease with stage 1 through stage 4 chronic kidney disease, or unspecified chronic kidney disease: Secondary | ICD-10-CM | POA: Diagnosis not present

## 2023-03-16 DIAGNOSIS — M86172 Other acute osteomyelitis, left ankle and foot: Secondary | ICD-10-CM | POA: Diagnosis not present

## 2023-03-16 HISTORY — PX: AMPUTATION: SHX166

## 2023-03-16 HISTORY — DX: Anemia, unspecified: D64.9

## 2023-03-16 LAB — GLUCOSE, CAPILLARY
Glucose-Capillary: 140 mg/dL — ABNORMAL HIGH (ref 70–99)
Glucose-Capillary: 114 mg/dL — ABNORMAL HIGH (ref 70–99)
Glucose-Capillary: 136 mg/dL — ABNORMAL HIGH (ref 70–99)

## 2023-03-16 LAB — CBC
HCT: 30.3 % — ABNORMAL LOW (ref 39.0–52.0)
Hemoglobin: 9.1 g/dL — ABNORMAL LOW (ref 13.0–17.0)
MCH: 26.1 pg (ref 26.0–34.0)
MCHC: 30 g/dL (ref 30.0–36.0)
MCV: 87.1 fL (ref 80.0–100.0)
Platelets: 192 10*3/uL (ref 150–400)
RBC: 3.48 MIL/uL — ABNORMAL LOW (ref 4.22–5.81)
RDW: 15.3 % (ref 11.5–15.5)
WBC: 6.3 10*3/uL (ref 4.0–10.5)
nRBC: 0 % (ref 0.0–0.2)

## 2023-03-16 LAB — BASIC METABOLIC PANEL
Anion gap: 11 (ref 5–15)
BUN: 15 mg/dL (ref 8–23)
CO2: 25 mmol/L (ref 22–32)
Calcium: 9.2 mg/dL (ref 8.9–10.3)
Chloride: 102 mmol/L (ref 98–111)
Creatinine, Ser: 1.15 mg/dL (ref 0.61–1.24)
GFR, Estimated: >60 mL/min (ref >60)
Glucose, Bld: 128 mg/dL — ABNORMAL HIGH (ref 70–99)
Potassium: 3.9 mmol/L (ref 3.5–5.1)
Sodium: 138 mmol/L (ref 135–145)

## 2023-03-16 SURGERY — AMPUTATION, FOOT, RAY
Anesthesia: Monitor Anesthesia Care | Laterality: Left

## 2023-03-16 MED ORDER — CHLORHEXIDINE GLUCONATE 0.12 % MT SOLN
15.0000 mL | Freq: Once | OROMUCOSAL | Status: AC
  Administered 2023-03-16: 15 mL via OROMUCOSAL
  Filled 2023-03-16: qty 15

## 2023-03-16 MED ORDER — FENTANYL CITRATE (PF) 100 MCG/2ML IJ SOLN: 25.0000 ug | INTRAMUSCULAR | Status: DC | PRN

## 2023-03-16 MED ORDER — PROPOFOL 500 MG/50ML IV EMUL
INTRAVENOUS | Status: DC | PRN
  Administered 2023-03-16: 50 ug/kg/min via INTRAVENOUS

## 2023-03-16 MED ORDER — ORAL CARE MOUTH RINSE: 15.0000 mL | Freq: Once | OROMUCOSAL | Status: AC

## 2023-03-16 MED ORDER — 0.9 % SODIUM CHLORIDE (POUR BTL) OPTIME
TOPICAL | Status: DC | PRN
  Administered 2023-03-16: 1000 mL

## 2023-03-16 MED ORDER — ACETAMINOPHEN 500 MG PO TABS
1000.0000 mg | ORAL_TABLET | Freq: Once | ORAL | Status: AC
  Administered 2023-03-16: 1000 mg via ORAL
  Filled 2023-03-16: qty 2

## 2023-03-16 MED ORDER — INSULIN ASPART 100 UNIT/ML IJ SOLN
0.0000 [IU] | INTRAMUSCULAR | Status: DC | PRN
Start: 2023-03-16 — End: 2023-03-16

## 2023-03-16 MED ORDER — CEFAZOLIN SODIUM-DEXTROSE 2-4 GM/100ML-% IV SOLN
INTRAVENOUS | Status: AC
  Filled 2023-03-16: qty 100

## 2023-03-16 MED ORDER — CEFAZOLIN SODIUM-DEXTROSE 2-4 GM/100ML-% IV SOLN
2.0000 g | INTRAVENOUS | Status: AC
  Administered 2023-03-16: 2 g via INTRAVENOUS

## 2023-03-16 MED ORDER — VASHE WOUND IRRIGATION OPTIME
TOPICAL | Status: DC | PRN
  Administered 2023-03-16: 34 [oz_av]

## 2023-03-16 MED ORDER — LIDOCAINE HCL 1 % IJ SOLN
INTRAMUSCULAR | Status: DC | PRN
Start: 2023-03-16 — End: 2023-03-16
  Administered 2023-03-16: 20 mL

## 2023-03-16 MED ORDER — LIDOCAINE HCL (PF) 1 % IJ SOLN
INTRAMUSCULAR | Status: AC
  Filled 2023-03-16: qty 30

## 2023-03-16 MED ORDER — LACTATED RINGERS IV SOLN: INTRAVENOUS | Status: DC

## 2023-03-16 SURGICAL SUPPLY — 27 items
BAG COUNTER SPONGE SURGICOUNT (BAG) ×1 IMPLANT
BLADE SURG 21 STRL SS (BLADE) ×2 IMPLANT
BNDG COHESIVE 4X5 TAN STRL LF (GAUZE/BANDAGES/DRESSINGS) ×2 IMPLANT
BNDG GAUZE DERMACEA FLUFF 4 (GAUZE/BANDAGES/DRESSINGS) ×2 IMPLANT
COVER SURGICAL LIGHT HANDLE (MISCELLANEOUS) ×4 IMPLANT
DRAPE DERMATAC (DRAPES) IMPLANT
DRAPE U-SHAPE 47X51 STRL (DRAPES) ×2 IMPLANT
DRESSING PEEL AND PLC PRVNA 13 (GAUZE/BANDAGES/DRESSINGS) IMPLANT
DRSG ADAPTIC 3X8 NADH LF (GAUZE/BANDAGES/DRESSINGS) ×1 IMPLANT
DRSG PEEL AND PLACE PREVENA 13 (GAUZE/BANDAGES/DRESSINGS) ×1 IMPLANT
DURAPREP 26ML APPLICATOR (WOUND CARE) ×2 IMPLANT
ELECT REM PT RETURN 9FT ADLT (ELECTROSURGICAL) ×1 IMPLANT
ELECTRODE REM PT RTRN 9FT ADLT (ELECTROSURGICAL) ×2 IMPLANT
GAUZE PAD ABD 8X10 STRL (GAUZE/BANDAGES/DRESSINGS) ×4 IMPLANT
GAUZE SPONGE 4X4 12PLY STRL (GAUZE/BANDAGES/DRESSINGS) ×1 IMPLANT
GLOVE BIOGEL PI IND STRL 9 (GLOVE) ×2 IMPLANT
GLOVE SURG ORTHO 9.0 STRL STRW (GLOVE) ×2 IMPLANT
GOWN STRL REUS W/ TWL XL LVL3 (GOWN DISPOSABLE) ×2 IMPLANT
KIT BASIN OR (CUSTOM PROCEDURE TRAY) ×2 IMPLANT
KIT TURNOVER KIT B (KITS) ×1 IMPLANT
NS IRRIG 1000ML POUR BTL (IV SOLUTION) ×1 IMPLANT
PACK ORTHO EXTREMITY (CUSTOM PROCEDURE TRAY) ×1 IMPLANT
PAD ARMBOARD 7.5X6 YLW CONV (MISCELLANEOUS) ×2 IMPLANT
SUT ETHILON 2 0 PSLX (SUTURE) ×1 IMPLANT
TOWEL GREEN STERILE (TOWEL DISPOSABLE) ×1 IMPLANT
TUBE CONNECTING 12X1/4 (SUCTIONS) ×1 IMPLANT
YANKAUER SUCT BULB TIP NO VENT (SUCTIONS) ×2 IMPLANT

## 2023-03-16 NOTE — Interval H&P Note (Signed)
 History and Physical Interval Note:  03/16/2023 11:42 AM  [name]  has presented today for surgery, with the diagnosis of Osteomyelitis Left Foot 5th Metatarsal.  The various methods of treatment have been discussed with the patient and family. After consideration of risks, benefits and other options for treatment, the patient has consented to  Procedure(s) with comments: AMPUTATION, FOOT, RAY (Left) - LEFT FOOT 5TH RAY AMPUTATION [id number] as a surgical intervention.  The patient's history has been reviewed, patient examined, no change in status, stable for surgery.  I have reviewed the patient's chart and labs.  Questions were answered to the patient's satisfaction.     Nadara Mustard

## 2023-03-16 NOTE — Transfer of Care (Signed)
 Immediate Anesthesia Transfer of Care Note  Patient: [name]  Procedure(s) Performed: AMPUTATION, FOOT, RAY (Left)  Patient Location: PACU  Anesthesia Type:MAC  Level of Consciousness: drowsy and patient cooperative  Airway & Oxygen Therapy: Patient Spontanous Breathing and Patient connected to nasal cannula oxygen  Post-op Assessment: Report given to RN, Post -op Vital signs reviewed and stable, and Patient moving all extremities X 4  Post vital signs: Reviewed and stable  Last Vitals:  Vitals Value Taken Time  BP 141/68 03/16/23 1227  Temp    Pulse 61 03/16/23 1229  Resp 19 03/16/23 1229  SpO2 98 % 03/16/23 1229  Vitals shown include unfiled device data.  Last Pain:  Vitals:   03/16/23 1031  TempSrc:   PainSc: 0-No pain         Complications: No notable events documented.

## 2023-03-16 NOTE — H&P (Signed)
 [name] is an 83 y.o. male.   Chief Complaint: Chronic draining ulcer right foot little toe HPI: Arville Lime is a 83 year old gentleman who is seen for initial evaluation for left foot osteomyelitis fifth metatarsal head. Patient is just completed a course of Augmentin and doxycycline. Ulcer noticed at the end of January. Patient reports a history of neuropathy. Patient has ankle-brachial indices that is over 30 years old.   Past Medical History:  Diagnosis Date   Adenomatous colon polyp    Allergic rhinitis    Anemia    Aortic stenosis 07/30/2019   mild to moderate AS (AVA VTI 1.37 cm, AV mean gradient 15.0 mmHg, AV Vmax 2.56 m/s)   BMI 40.0-44.9, adult (HCC)    Chronic insomnia    Chronic low back pain    Constipation, unspecified    Diabetes mellitus (HCC) 2015   DJD (degenerative joint disease) of knee    Hypercalcemia    Hypertension    Hypertriglyceridemia    Junctional tachycardia (HCC)    Lumbar facet arthropathy    Morbid obesity (HCC)    Neuropathy    Nonallergic rhinitis    OSA (obstructive sleep apnea)    wife denies   Other chronic pain    PAF (paroxysmal atrial fibrillation) (HCC) 01/2020   Peripheral axonal neuropathy    Upper GI bleed    due to aspirin   Venous insufficiency    Venous stasis dermatitis of both lower extremities     Past Surgical History:  Procedure Laterality Date   AMPUTATION Right 08/30/2021   Procedure: Right index finger Amputaion;  Surgeon: Marlyne Beards, MD;  Location: [facility] OR;  Service: Orthopedics;  Laterality: Right;   cataract     Bilateral   CORNEAL TRANSPLANT Bilateral    GLAUCOMA SURGERY     HAND SURGERY Left    s/p amputation of left finger   I & D EXTREMITY Right 04/10/2020   Procedure: IRRIGATION AND DEBRIDEMENT AND AMPUTATION OF RIGHT RING FINGER;  Surgeon: Bradly Bienenstock, MD;  Location: [facility] OR;  Service: Orthopedics;  Laterality: Right;    Family History  Problem Relation Age of Onset   Gout Father     Osteoarthritis Mother        Deceased, 29   Healthy Brother        x2   Healthy Son        x2   Social History:  reports that he has quit smoking. He has never used smokeless tobacco. He reports that he does not drink alcohol and does not use drugs.  Allergies:  Allergies  Allergen Reactions   Ambien [Zolpidem Tartrate] Other (See Comments)    Confusion Memory loss   Aspirin Other (See Comments)    Bleeding ulcer   Cosopt [Dorzolamide Hcl-Timolol Mal] Other (See Comments)    Red and burning eyes   Diamox [Acetazolamide] Other (See Comments)    Unknown reaction    No medications prior to admission.    No results found for this or any previous visit (from the past 48 hours). No results found.  Review of Systems  All other systems reviewed and are negative.   There were no vitals taken for this visit. Physical Exam  Patient is alert, oriented, no adenopathy, well-dressed, normal affect, normal respiratory effort. Examination of both feet patient has a palpable dorsalis pedis pulse bilaterally.  With the Doppler patient has a strong biphasic dorsalis pedis pulse.  Radiograph shows calcification of the arteries past the  ankle.  Radiograph shows chronic destructive changes of the fifth metatarsal head left foot consistent with chronic osteomyelitis.  There is no ascending cellulitis.  There is a necrotic ulcer that probes to bone over the fifth metatarsal head.  Previous ankle-brachial indices showed triphasic flow. Assessment/Plan 1. 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  Risk and benefits were discussed including risk of the wound not healing need for additional surgery.  Discussed the importance of minimizing weightbearing.  Nadara Mustard, MD 03/16/2023, 6:51 AM

## 2023-03-16 NOTE — Op Note (Signed)
 03/16/2023  12:39 PM  PATIENT:  [name]    PRE-OPERATIVE DIAGNOSIS:  Osteomyelitis Left Foot 5th Metatarsal  POST-OPERATIVE DIAGNOSIS:  Same  PROCEDURE: Amputation fifth ray left foot. Local tissue transfer for wound closure 10 x 4 cm. Application of 13 cm Prevena wound VAC.  SURGEON:  Nadara Mustard, MD  PHYSICIAN ASSISTANT:None ANESTHESIA:   General  PREOPERATIVE INDICATIONS:  [name] is a  83 y.o. male with a diagnosis of Osteomyelitis Left Foot 5th Metatarsal who failed conservative measures and elected for surgical management.    The risks benefits and alternatives were discussed with the patient preoperatively including but not limited to the risks of infection, bleeding, nerve injury, cardiopulmonary complications, the need for revision surgery, among others, and the patient was willing to proceed.  OPERATIVE IMPLANTS:   * No implants in log *  @ENCIMAGES @  OPERATIVE FINDINGS: Patient had good petechial bleeding.  Tissue margins were clear.  OPERATIVE PROCEDURE: Patient brought the operating room and underwent a MAC anesthetic.  The left lower extremity was prepped using DuraPrep draped into a sterile field a timeout was called.  10 cc of 1% lidocaine plain was used for local block.  After adequate levels anesthesia were obtained a racquet incision was made around the ulcerative tissue in the fifth ray.  This left a wound that was 10 x 4 cm.  The fifth ray was resected through the base.  Electrocautery was used hemostasis.  The wound was irrigated with Vashe irrigation.  The tissue margins were undermined and local tissue transfer was used to close the wound 10 x 4 cm.  A 13 cm Prevena wound VAC was applied this had a good suction fit the entire leg was wrapped out with Coban to the venous insufficiency.  Patient was taken to PACU in stable condition.   DISCHARGE PLANNING:  Antibiotic duration: Preoperative antibiotics  Weightbearing: Touchdown weightbearing on  the left  Pain medication: Patient has pain medicine at home  Dressing care/ Wound VAC: Wound VAC  Ambulatory devices: Walker or crutches  Discharge to: Home.  Follow-up: In the office 1 week post operative.

## 2023-03-16 NOTE — Anesthesia Postprocedure Evaluation (Signed)
 Anesthesia Post Note  Patient: [name]  Procedure(s) Performed: AMPUTATION, FOOT, RAY (Left)     Patient location during evaluation: PACU Anesthesia Type: MAC Level of consciousness: awake and alert Pain management: pain level controlled Vital Signs Assessment: post-procedure vital signs reviewed and stable Respiratory status: spontaneous breathing, nonlabored ventilation and respiratory function stable Cardiovascular status: stable and blood pressure returned to baseline Postop Assessment: no apparent nausea or vomiting Anesthetic complications: no  No notable events documented.  Last Vitals:  Vitals:   03/16/23 1228 03/16/23 1230  BP: (!) 141/68 (!) 141/70  Pulse: 64 61  Resp: 15 19  Temp: 36.4 C   SpO2: 98% 98%    Last Pain:  Vitals:   03/16/23 1230  TempSrc:   PainSc: Asleep                 Ashleyanne Hemmingway,W. EDMOND

## 2023-03-17 ENCOUNTER — Encounter (HOSPITAL_COMMUNITY): Payer: Self-pay | Admitting: Orthopedic Surgery

## 2023-03-23 ENCOUNTER — Ambulatory Visit (INDEPENDENT_AMBULATORY_CARE_PROVIDER_SITE_OTHER): Admitting: Family

## 2023-03-23 DIAGNOSIS — M869 Osteomyelitis, unspecified: Secondary | ICD-10-CM

## 2023-03-26 ENCOUNTER — Encounter: Payer: Self-pay | Admitting: Family

## 2023-03-26 NOTE — Progress Notes (Signed)
 Post-Op Visit Note   Patient: [name]           Date of Birth: 06-17-1940           MRN: [id number] Visit Date: 03/23/2023 PCP: Kirby Funk, MD (Inactive)  Chief Complaint:  Chief Complaint  Patient presents with   Left Foot - Routine Post Op    03/16/23 left 5th ray amputation    HPI:  HPI The patient is an 83 year old gentleman seen status post left fifth ray amputation Ortho Exam On examination left foot the incision is approximated with sutures there is no gaping there is some surrounding maceration and scant bloody drainage moderate edema  Visit Diagnoses: No diagnosis found.  Plan: Begin daily dose of cleansing.  Dry dressings.  Emphasized minimizing weightbearing elevate for swelling follow-up in 2 weeks for suture removal  Follow-Up Instructions: No follow-ups on file.   Imaging: No results found.  Orders:  No orders of the defined types were placed in this encounter.  No orders of the defined types were placed in this encounter.    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   Glaucoma 01/06/2020   Tachycardia 07/10/2019   Essential hypertension 08/01/2013   Past Medical History:  Diagnosis Date   Adenomatous colon polyp    Allergic rhinitis    Anemia    Aortic stenosis 07/30/2019   mild to moderate AS (AVA VTI 1.37 cm, AV mean gradient 15.0 mmHg, AV Vmax 2.56 m/s)   BMI  40.0-44.9, adult (HCC)    Chronic insomnia    Chronic low back pain    Constipation, unspecified    Diabetes mellitus (HCC) 2015   DJD (degenerative joint disease) of knee    Hypercalcemia    Hypertension    Hypertriglyceridemia    Junctional tachycardia (HCC)    Lumbar facet arthropathy    Morbid obesity (HCC)    Neuropathy    Nonallergic rhinitis    OSA (obstructive sleep apnea)    wife denies   Other chronic pain    PAF (paroxysmal atrial fibrillation) (HCC) 01/2020   Peripheral axonal neuropathy    Upper GI bleed    due to aspirin   Venous insufficiency    Venous stasis dermatitis of both lower extremities     Family History  Problem Relation Age of Onset   Gout Father    Osteoarthritis Mother        Deceased, 48   Healthy Brother        x2   Healthy Son        x2    Past Surgical History:  Procedure Laterality Date   AMPUTATION Right 08/30/2021   Procedure: Right index finger Amputaion;  Surgeon: Marlyne Beards, MD;  Location: [facility] OR;  Service: Orthopedics;  Laterality: Right;   AMPUTATION Left 03/16/2023   Procedure: AMPUTATION, FOOT, RAY;  Surgeon: Nadara Mustard, MD;  Location: [facility];  Service: Orthopedics;  Laterality: Left;  LEFT FOOT 5TH RAY AMPUTATION 28810   cataract     Bilateral   CORNEAL TRANSPLANT Bilateral    GLAUCOMA SURGERY     HAND SURGERY Left    s/p amputation of left finger   I & D EXTREMITY Right 04/10/2020   Procedure: IRRIGATION AND DEBRIDEMENT AND AMPUTATION OF RIGHT RING FINGER;  Surgeon: Bradly Bienenstock, MD;  Location: [facility];  Service: Orthopedics;  Laterality: Right;   Social History   Occupational History   Not on file  Tobacco Use   Smoking status: Former   Smokeless tobacco: Never   Tobacco comments:    Quit 20 years ago.    Substance and Sexual Activity   Alcohol use: No   Drug use: No   Sexual activity: Not on file

## 2023-03-28 DIAGNOSIS — Z993 Dependence on wheelchair: Secondary | ICD-10-CM | POA: Diagnosis not present

## 2023-03-28 DIAGNOSIS — G4733 Obstructive sleep apnea (adult) (pediatric): Secondary | ICD-10-CM | POA: Diagnosis not present

## 2023-03-28 DIAGNOSIS — E11621 Type 2 diabetes mellitus with foot ulcer: Secondary | ICD-10-CM | POA: Diagnosis not present

## 2023-03-28 DIAGNOSIS — I350 Nonrheumatic aortic (valve) stenosis: Secondary | ICD-10-CM | POA: Diagnosis not present

## 2023-03-28 DIAGNOSIS — L03116 Cellulitis of left lower limb: Secondary | ICD-10-CM | POA: Diagnosis not present

## 2023-03-28 DIAGNOSIS — Z87891 Personal history of nicotine dependence: Secondary | ICD-10-CM | POA: Diagnosis not present

## 2023-03-28 DIAGNOSIS — N189 Chronic kidney disease, unspecified: Secondary | ICD-10-CM | POA: Diagnosis not present

## 2023-03-28 DIAGNOSIS — E1142 Type 2 diabetes mellitus with diabetic polyneuropathy: Secondary | ICD-10-CM | POA: Diagnosis not present

## 2023-03-28 DIAGNOSIS — E1151 Type 2 diabetes mellitus with diabetic peripheral angiopathy without gangrene: Secondary | ICD-10-CM | POA: Diagnosis not present

## 2023-03-28 DIAGNOSIS — I129 Hypertensive chronic kidney disease with stage 1 through stage 4 chronic kidney disease, or unspecified chronic kidney disease: Secondary | ICD-10-CM | POA: Diagnosis not present

## 2023-03-28 DIAGNOSIS — Z7984 Long term (current) use of oral hypoglycemic drugs: Secondary | ICD-10-CM | POA: Diagnosis not present

## 2023-03-28 DIAGNOSIS — E1122 Type 2 diabetes mellitus with diabetic chronic kidney disease: Secondary | ICD-10-CM | POA: Diagnosis not present

## 2023-03-28 DIAGNOSIS — Z7901 Long term (current) use of anticoagulants: Secondary | ICD-10-CM | POA: Diagnosis not present

## 2023-03-28 DIAGNOSIS — I480 Paroxysmal atrial fibrillation: Secondary | ICD-10-CM | POA: Diagnosis not present

## 2023-03-28 DIAGNOSIS — Z7985 Long-term (current) use of injectable non-insulin antidiabetic drugs: Secondary | ICD-10-CM | POA: Diagnosis not present

## 2023-03-28 DIAGNOSIS — L97522 Non-pressure chronic ulcer of other part of left foot with fat layer exposed: Secondary | ICD-10-CM | POA: Diagnosis not present

## 2023-03-28 DIAGNOSIS — I872 Venous insufficiency (chronic) (peripheral): Secondary | ICD-10-CM | POA: Diagnosis not present

## 2023-03-28 DIAGNOSIS — Z556 Problems related to health literacy: Secondary | ICD-10-CM | POA: Diagnosis not present

## 2023-03-28 DIAGNOSIS — G8929 Other chronic pain: Secondary | ICD-10-CM | POA: Diagnosis not present

## 2023-04-05 DIAGNOSIS — E1122 Type 2 diabetes mellitus with diabetic chronic kidney disease: Secondary | ICD-10-CM | POA: Diagnosis not present

## 2023-04-05 DIAGNOSIS — N189 Chronic kidney disease, unspecified: Secondary | ICD-10-CM | POA: Diagnosis not present

## 2023-04-05 DIAGNOSIS — I129 Hypertensive chronic kidney disease with stage 1 through stage 4 chronic kidney disease, or unspecified chronic kidney disease: Secondary | ICD-10-CM | POA: Diagnosis not present

## 2023-04-05 DIAGNOSIS — E1151 Type 2 diabetes mellitus with diabetic peripheral angiopathy without gangrene: Secondary | ICD-10-CM | POA: Diagnosis not present

## 2023-04-05 DIAGNOSIS — Z556 Problems related to health literacy: Secondary | ICD-10-CM | POA: Diagnosis not present

## 2023-04-05 DIAGNOSIS — Z993 Dependence on wheelchair: Secondary | ICD-10-CM | POA: Diagnosis not present

## 2023-04-05 DIAGNOSIS — I480 Paroxysmal atrial fibrillation: Secondary | ICD-10-CM | POA: Diagnosis not present

## 2023-04-05 DIAGNOSIS — G8929 Other chronic pain: Secondary | ICD-10-CM | POA: Diagnosis not present

## 2023-04-05 DIAGNOSIS — I350 Nonrheumatic aortic (valve) stenosis: Secondary | ICD-10-CM | POA: Diagnosis not present

## 2023-04-05 DIAGNOSIS — Z87891 Personal history of nicotine dependence: Secondary | ICD-10-CM | POA: Diagnosis not present

## 2023-04-05 DIAGNOSIS — L97522 Non-pressure chronic ulcer of other part of left foot with fat layer exposed: Secondary | ICD-10-CM | POA: Diagnosis not present

## 2023-04-05 DIAGNOSIS — G4733 Obstructive sleep apnea (adult) (pediatric): Secondary | ICD-10-CM | POA: Diagnosis not present

## 2023-04-05 DIAGNOSIS — E1142 Type 2 diabetes mellitus with diabetic polyneuropathy: Secondary | ICD-10-CM | POA: Diagnosis not present

## 2023-04-05 DIAGNOSIS — Z7901 Long term (current) use of anticoagulants: Secondary | ICD-10-CM | POA: Diagnosis not present

## 2023-04-05 DIAGNOSIS — Z7984 Long term (current) use of oral hypoglycemic drugs: Secondary | ICD-10-CM | POA: Diagnosis not present

## 2023-04-05 DIAGNOSIS — Z7985 Long-term (current) use of injectable non-insulin antidiabetic drugs: Secondary | ICD-10-CM | POA: Diagnosis not present

## 2023-04-05 DIAGNOSIS — I872 Venous insufficiency (chronic) (peripheral): Secondary | ICD-10-CM | POA: Diagnosis not present

## 2023-04-05 DIAGNOSIS — E11621 Type 2 diabetes mellitus with foot ulcer: Secondary | ICD-10-CM | POA: Diagnosis not present

## 2023-04-05 DIAGNOSIS — L03116 Cellulitis of left lower limb: Secondary | ICD-10-CM | POA: Diagnosis not present

## 2023-04-06 ENCOUNTER — Ambulatory Visit (INDEPENDENT_AMBULATORY_CARE_PROVIDER_SITE_OTHER): Admitting: Family

## 2023-04-06 DIAGNOSIS — M869 Osteomyelitis, unspecified: Secondary | ICD-10-CM

## 2023-04-10 ENCOUNTER — Encounter: Payer: Self-pay | Admitting: Family

## 2023-04-10 NOTE — Progress Notes (Signed)
 Post-Op Visit Note   Patient: [name]           Date of Birth: 06/18/1940           MRN: [id number] Visit Date: 04/06/2023 PCP: Kirby Funk, MD (Inactive)  Chief Complaint:  Chief Complaint  Patient presents with   Left Foot - Routine Post Op    HPI:  HPI The patient is an 83 year old gentleman seen status post fifth ray amputation of the left foot Ortho Exam On examination left foot the incision is approximated with sutures there is no gaping or drainage there is some dorsal ischemic changes along the course of the incision.  There is no active drainage or maceration  Visit Diagnoses: No diagnosis found.  Plan: Daily Dial soap cleansing.  Dry dressings.  Minimize weightbearing.  Follow-Up Instructions: No follow-ups on file.   Imaging: No results found.  Orders:  No orders of the defined types were placed in this encounter.  No orders of the defined types were placed in this encounter.    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   Glaucoma 01/06/2020   Tachycardia 07/10/2019   Essential hypertension 08/01/2013   Past Medical History:  Diagnosis Date   Adenomatous colon polyp    Allergic rhinitis    Anemia    Aortic stenosis 07/30/2019   mild to moderate AS (AVA VTI 1.37 cm, AV mean gradient 15.0 mmHg, AV Vmax 2.56 m/s)   BMI 40.0-44.9, adult (HCC)    Chronic insomnia     Chronic low back pain    Constipation, unspecified    Diabetes mellitus (HCC) 2015   DJD (degenerative joint disease) of knee    Hypercalcemia    Hypertension    Hypertriglyceridemia    Junctional tachycardia (HCC)    Lumbar facet arthropathy    Morbid obesity (HCC)    Neuropathy    Nonallergic rhinitis    OSA (obstructive sleep apnea)    wife denies   Other chronic pain    PAF (paroxysmal atrial fibrillation) (HCC) 01/2020   Peripheral axonal neuropathy    Upper GI bleed    due to aspirin   Venous insufficiency    Venous stasis dermatitis of both lower extremities     Family History  Problem Relation Age of Onset   Gout Father    Osteoarthritis Mother        Deceased, 7   Healthy Brother        x2   Healthy Son        x2    Past Surgical History:  Procedure Laterality Date   AMPUTATION Right 08/30/2021   Procedure: Right index finger Amputaion;  Surgeon: Marlyne Beards, MD;  Location: [facility];  Service: Orthopedics;  Laterality: Right;   AMPUTATION Left 03/16/2023   Procedure: AMPUTATION, FOOT, RAY;  Surgeon: Nadara Mustard, MD;  Location: [facility];  Service: Orthopedics;  Laterality: Left;  LEFT FOOT 5TH  RAY AMPUTATION 28810   cataract     Bilateral   CORNEAL TRANSPLANT Bilateral    GLAUCOMA SURGERY     HAND SURGERY Left    s/p amputation of left finger   I & D EXTREMITY Right 04/10/2020   Procedure: IRRIGATION AND DEBRIDEMENT AND AMPUTATION OF RIGHT RING FINGER;  Surgeon: Bradly Bienenstock, MD;  Location: [facility];  Service: Orthopedics;  Laterality: Right;   Social History   Occupational History   Not on file  Tobacco Use   Smoking status: Former   Smokeless tobacco: Never   Tobacco comments:    Quit 20 years ago.    Substance and Sexual Activity   Alcohol use: No   Drug use: No   Sexual activity: Not on file

## 2023-04-12 ENCOUNTER — Encounter (HOSPITAL_COMMUNITY): Payer: Self-pay | Admitting: Internal Medicine

## 2023-04-12 ENCOUNTER — Other Ambulatory Visit: Payer: Self-pay

## 2023-04-12 ENCOUNTER — Emergency Department (HOSPITAL_COMMUNITY)

## 2023-04-12 ENCOUNTER — Inpatient Hospital Stay (HOSPITAL_COMMUNITY)
Admission: EM | Admit: 2023-04-12 | Discharge: 2023-04-23 | DRG: 862 | Disposition: A | Discharge status: Hospice | Attending: Internal Medicine | Admitting: Internal Medicine

## 2023-04-12 DIAGNOSIS — M869 Osteomyelitis, unspecified: Secondary | ICD-10-CM | POA: Diagnosis present

## 2023-04-12 DIAGNOSIS — G9341 Metabolic encephalopathy: Secondary | ICD-10-CM | POA: Diagnosis not present

## 2023-04-12 DIAGNOSIS — Z9841 Cataract extraction status, right eye: Secondary | ICD-10-CM

## 2023-04-12 DIAGNOSIS — R0989 Other specified symptoms and signs involving the circulatory and respiratory systems: Secondary | ICD-10-CM | POA: Diagnosis not present

## 2023-04-12 DIAGNOSIS — Z888 Allergy status to other drugs, medicaments and biological substances status: Secondary | ICD-10-CM

## 2023-04-12 DIAGNOSIS — D649 Anemia, unspecified: Secondary | ICD-10-CM | POA: Diagnosis not present

## 2023-04-12 DIAGNOSIS — R1311 Dysphagia, oral phase: Secondary | ICD-10-CM | POA: Diagnosis present

## 2023-04-12 DIAGNOSIS — I959 Hypotension, unspecified: Secondary | ICD-10-CM | POA: Diagnosis present

## 2023-04-12 DIAGNOSIS — N1831 Chronic kidney disease, stage 3a: Secondary | ICD-10-CM

## 2023-04-12 DIAGNOSIS — Z66 Do not resuscitate: Secondary | ICD-10-CM | POA: Diagnosis not present

## 2023-04-12 DIAGNOSIS — E1142 Type 2 diabetes mellitus with diabetic polyneuropathy: Secondary | ICD-10-CM | POA: Diagnosis not present

## 2023-04-12 DIAGNOSIS — E876 Hypokalemia: Secondary | ICD-10-CM | POA: Diagnosis not present

## 2023-04-12 DIAGNOSIS — Z89432 Acquired absence of left foot: Secondary | ICD-10-CM

## 2023-04-12 DIAGNOSIS — Z993 Dependence on wheelchair: Secondary | ICD-10-CM

## 2023-04-12 DIAGNOSIS — R748 Abnormal levels of other serum enzymes: Secondary | ICD-10-CM | POA: Diagnosis present

## 2023-04-12 DIAGNOSIS — I130 Hypertensive heart and chronic kidney disease with heart failure and stage 1 through stage 4 chronic kidney disease, or unspecified chronic kidney disease: Secondary | ICD-10-CM | POA: Diagnosis present

## 2023-04-12 DIAGNOSIS — K760 Fatty (change of) liver, not elsewhere classified: Secondary | ICD-10-CM | POA: Diagnosis not present

## 2023-04-12 DIAGNOSIS — Z886 Allergy status to analgesic agent status: Secondary | ICD-10-CM

## 2023-04-12 DIAGNOSIS — R404 Transient alteration of awareness: Secondary | ICD-10-CM | POA: Diagnosis not present

## 2023-04-12 DIAGNOSIS — Z89021 Acquired absence of right finger(s): Secondary | ICD-10-CM

## 2023-04-12 DIAGNOSIS — I5033 Acute on chronic diastolic (congestive) heart failure: Secondary | ICD-10-CM | POA: Diagnosis not present

## 2023-04-12 DIAGNOSIS — Z743 Need for continuous supervision: Secondary | ICD-10-CM | POA: Diagnosis not present

## 2023-04-12 DIAGNOSIS — R0609 Other forms of dyspnea: Secondary | ICD-10-CM | POA: Diagnosis not present

## 2023-04-12 DIAGNOSIS — R739 Hyperglycemia, unspecified: Secondary | ICD-10-CM | POA: Diagnosis not present

## 2023-04-12 DIAGNOSIS — R7401 Elevation of levels of liver transaminase levels: Secondary | ICD-10-CM | POA: Diagnosis present

## 2023-04-12 DIAGNOSIS — E781 Pure hyperglyceridemia: Secondary | ICD-10-CM | POA: Diagnosis present

## 2023-04-12 DIAGNOSIS — I5023 Acute on chronic systolic (congestive) heart failure: Secondary | ICD-10-CM | POA: Diagnosis not present

## 2023-04-12 DIAGNOSIS — E669 Obesity, unspecified: Secondary | ICD-10-CM | POA: Diagnosis not present

## 2023-04-12 DIAGNOSIS — E1165 Type 2 diabetes mellitus with hyperglycemia: Secondary | ICD-10-CM | POA: Diagnosis not present

## 2023-04-12 DIAGNOSIS — Z947 Corneal transplant status: Secondary | ICD-10-CM | POA: Diagnosis not present

## 2023-04-12 DIAGNOSIS — I083 Combined rheumatic disorders of mitral, aortic and tricuspid valves: Secondary | ICD-10-CM | POA: Diagnosis not present

## 2023-04-12 DIAGNOSIS — R5381 Other malaise: Secondary | ICD-10-CM | POA: Diagnosis present

## 2023-04-12 DIAGNOSIS — Z87891 Personal history of nicotine dependence: Secondary | ICD-10-CM

## 2023-04-12 DIAGNOSIS — M47816 Spondylosis without myelopathy or radiculopathy, lumbar region: Secondary | ICD-10-CM | POA: Diagnosis present

## 2023-04-12 DIAGNOSIS — Z860101 Personal history of adenomatous and serrated colon polyps: Secondary | ICD-10-CM

## 2023-04-12 DIAGNOSIS — N179 Acute kidney failure, unspecified: Secondary | ICD-10-CM | POA: Diagnosis present

## 2023-04-12 DIAGNOSIS — Z794 Long term (current) use of insulin: Secondary | ICD-10-CM | POA: Diagnosis not present

## 2023-04-12 DIAGNOSIS — Y835 Amputation of limb(s) as the cause of abnormal reaction of the patient, or of later complication, without mention of misadventure at the time of the procedure: Secondary | ICD-10-CM | POA: Diagnosis present

## 2023-04-12 DIAGNOSIS — Z79899 Other long term (current) drug therapy: Secondary | ICD-10-CM

## 2023-04-12 DIAGNOSIS — E872 Acidosis, unspecified: Secondary | ICD-10-CM | POA: Diagnosis not present

## 2023-04-12 DIAGNOSIS — E1169 Type 2 diabetes mellitus with other specified complication: Secondary | ICD-10-CM | POA: Diagnosis not present

## 2023-04-12 DIAGNOSIS — Z515 Encounter for palliative care: Secondary | ICD-10-CM

## 2023-04-12 DIAGNOSIS — E871 Hypo-osmolality and hyponatremia: Secondary | ICD-10-CM | POA: Diagnosis not present

## 2023-04-12 DIAGNOSIS — R531 Weakness: Secondary | ICD-10-CM | POA: Diagnosis not present

## 2023-04-12 DIAGNOSIS — D62 Acute posthemorrhagic anemia: Secondary | ICD-10-CM | POA: Diagnosis present

## 2023-04-12 DIAGNOSIS — Z9889 Other specified postprocedural states: Secondary | ICD-10-CM

## 2023-04-12 DIAGNOSIS — G8929 Other chronic pain: Secondary | ICD-10-CM | POA: Diagnosis present

## 2023-04-12 DIAGNOSIS — Z8261 Family history of arthritis: Secondary | ICD-10-CM

## 2023-04-12 DIAGNOSIS — I10 Essential (primary) hypertension: Secondary | ICD-10-CM | POA: Diagnosis not present

## 2023-04-12 DIAGNOSIS — R6889 Other general symptoms and signs: Secondary | ICD-10-CM | POA: Diagnosis not present

## 2023-04-12 DIAGNOSIS — E66811 Obesity, class 1: Secondary | ICD-10-CM

## 2023-04-12 DIAGNOSIS — I499 Cardiac arrhythmia, unspecified: Secondary | ICD-10-CM | POA: Diagnosis not present

## 2023-04-12 DIAGNOSIS — D509 Iron deficiency anemia, unspecified: Secondary | ICD-10-CM | POA: Diagnosis not present

## 2023-04-12 DIAGNOSIS — R451 Restlessness and agitation: Secondary | ICD-10-CM | POA: Diagnosis not present

## 2023-04-12 DIAGNOSIS — A419 Sepsis, unspecified organism: Secondary | ICD-10-CM | POA: Diagnosis not present

## 2023-04-12 DIAGNOSIS — I5043 Acute on chronic combined systolic (congestive) and diastolic (congestive) heart failure: Secondary | ICD-10-CM | POA: Diagnosis not present

## 2023-04-12 DIAGNOSIS — E1152 Type 2 diabetes mellitus with diabetic peripheral angiopathy with gangrene: Secondary | ICD-10-CM | POA: Diagnosis not present

## 2023-04-12 DIAGNOSIS — G4733 Obstructive sleep apnea (adult) (pediatric): Secondary | ICD-10-CM | POA: Diagnosis present

## 2023-04-12 DIAGNOSIS — I4892 Unspecified atrial flutter: Secondary | ICD-10-CM | POA: Diagnosis not present

## 2023-04-12 DIAGNOSIS — I2510 Atherosclerotic heart disease of native coronary artery without angina pectoris: Secondary | ICD-10-CM | POA: Diagnosis present

## 2023-04-12 DIAGNOSIS — I672 Cerebral atherosclerosis: Secondary | ICD-10-CM | POA: Diagnosis not present

## 2023-04-12 DIAGNOSIS — L7622 Postprocedural hemorrhage and hematoma of skin and subcutaneous tissue following other procedure: Secondary | ICD-10-CM | POA: Diagnosis not present

## 2023-04-12 DIAGNOSIS — I480 Paroxysmal atrial fibrillation: Secondary | ICD-10-CM | POA: Diagnosis not present

## 2023-04-12 DIAGNOSIS — Z89422 Acquired absence of other left toe(s): Secondary | ICD-10-CM | POA: Diagnosis not present

## 2023-04-12 DIAGNOSIS — Z9842 Cataract extraction status, left eye: Secondary | ICD-10-CM

## 2023-04-12 DIAGNOSIS — R188 Other ascites: Secondary | ICD-10-CM | POA: Diagnosis not present

## 2023-04-12 DIAGNOSIS — E1122 Type 2 diabetes mellitus with diabetic chronic kidney disease: Secondary | ICD-10-CM | POA: Diagnosis present

## 2023-04-12 DIAGNOSIS — Z7985 Long-term (current) use of injectable non-insulin antidiabetic drugs: Secondary | ICD-10-CM

## 2023-04-12 DIAGNOSIS — Z7189 Other specified counseling: Secondary | ICD-10-CM | POA: Diagnosis not present

## 2023-04-12 DIAGNOSIS — I872 Venous insufficiency (chronic) (peripheral): Secondary | ICD-10-CM | POA: Diagnosis present

## 2023-04-12 DIAGNOSIS — Z6833 Body mass index (BMI) 33.0-33.9, adult: Secondary | ICD-10-CM

## 2023-04-12 DIAGNOSIS — Z7984 Long term (current) use of oral hypoglycemic drugs: Secondary | ICD-10-CM

## 2023-04-12 DIAGNOSIS — T8141XA Infection following a procedure, superficial incisional surgical site, initial encounter: Secondary | ICD-10-CM | POA: Diagnosis not present

## 2023-04-12 DIAGNOSIS — Z7901 Long term (current) use of anticoagulants: Secondary | ICD-10-CM

## 2023-04-12 DIAGNOSIS — Z8719 Personal history of other diseases of the digestive system: Secondary | ICD-10-CM

## 2023-04-12 DIAGNOSIS — F5104 Psychophysiologic insomnia: Secondary | ICD-10-CM | POA: Diagnosis present

## 2023-04-12 DIAGNOSIS — L0889 Other specified local infections of the skin and subcutaneous tissue: Secondary | ICD-10-CM | POA: Diagnosis present

## 2023-04-12 DIAGNOSIS — D72829 Elevated white blood cell count, unspecified: Secondary | ICD-10-CM | POA: Diagnosis present

## 2023-04-12 DIAGNOSIS — R680 Hypothermia, not associated with low environmental temperature: Secondary | ICD-10-CM | POA: Diagnosis present

## 2023-04-12 DIAGNOSIS — Z789 Other specified health status: Secondary | ICD-10-CM | POA: Diagnosis not present

## 2023-04-12 LAB — BRAIN NATRIURETIC PEPTIDE: B Natriuretic Peptide: 1901.9 pg/mL — ABNORMAL HIGH (ref 0.0–100.0)

## 2023-04-12 LAB — COMPREHENSIVE METABOLIC PANEL WITH GFR
ALT: 577 U/L — ABNORMAL HIGH (ref 0–44)
AST: 686 U/L — ABNORMAL HIGH (ref 15–41)
Albumin: 2.5 g/dL — ABNORMAL LOW (ref 3.5–5.0)
Alkaline Phosphatase: 87 U/L (ref 38–126)
Anion gap: 17 — ABNORMAL HIGH (ref 5–15)
BUN: 55 mg/dL — ABNORMAL HIGH (ref 8–23)
CO2: 18 mmol/L — ABNORMAL LOW (ref 22–32)
Calcium: 9 mg/dL (ref 8.9–10.3)
Chloride: 98 mmol/L (ref 98–111)
Creatinine, Ser: 1.91 mg/dL — ABNORMAL HIGH (ref 0.61–1.24)
GFR, Estimated: 34 mL/min — ABNORMAL LOW (ref >60)
Glucose, Bld: 258 mg/dL — ABNORMAL HIGH (ref 70–99)
Potassium: 5.1 mmol/L (ref 3.5–5.1)
Sodium: 133 mmol/L — ABNORMAL LOW (ref 135–145)
Total Bilirubin: 1.9 mg/dL — ABNORMAL HIGH (ref 0.0–1.2)
Total Protein: 6.5 g/dL (ref 6.5–8.1)

## 2023-04-12 LAB — CBC WITH DIFFERENTIAL/PLATELET
Abs Immature Granulocytes: 0.11 10*3/uL — ABNORMAL HIGH (ref 0.00–0.07)
Basophils Absolute: 0 10*3/uL (ref 0.0–0.1)
Basophils Relative: 0 %
Eosinophils Absolute: 0 10*3/uL (ref 0.0–0.5)
Eosinophils Relative: 0 %
HCT: 15.4 % — ABNORMAL LOW (ref 39.0–52.0)
Hemoglobin: 4.1 g/dL — CL (ref 13.0–17.0)
Immature Granulocytes: 1 %
Lymphocytes Relative: 6 %
Lymphs Abs: 0.9 10*3/uL (ref 0.7–4.0)
MCH: 22.2 pg — ABNORMAL LOW (ref 26.0–34.0)
MCHC: 26.6 g/dL — ABNORMAL LOW (ref 30.0–36.0)
MCV: 83.2 fL (ref 80.0–100.0)
Monocytes Absolute: 1 10*3/uL (ref 0.1–1.0)
Monocytes Relative: 7 %
Neutro Abs: 12.7 10*3/uL — ABNORMAL HIGH (ref 1.7–7.7)
Neutrophils Relative %: 86 %
Platelets: 395 10*3/uL (ref 150–400)
RBC: 1.85 MIL/uL — ABNORMAL LOW (ref 4.22–5.81)
RDW: 17.2 % — ABNORMAL HIGH (ref 11.5–15.5)
WBC: 14.8 10*3/uL — ABNORMAL HIGH (ref 4.0–10.5)
nRBC: 2.4 % — ABNORMAL HIGH (ref 0.0–0.2)

## 2023-04-12 LAB — URINALYSIS, W/ REFLEX TO CULTURE (INFECTION SUSPECTED)
Bilirubin Urine: NEGATIVE
Glucose, UA: NEGATIVE mg/dL
Hgb urine dipstick: NEGATIVE
Ketones, ur: NEGATIVE mg/dL
Nitrite: NEGATIVE
Protein, ur: NEGATIVE mg/dL
Specific Gravity, Urine: 1.015 (ref 1.005–1.030)
pH: 5.5 (ref 5.0–8.0)

## 2023-04-12 LAB — BLOOD GAS, VENOUS
Acid-base deficit: 7 mmol/L — ABNORMAL HIGH (ref 0.0–2.0)
Bicarbonate: 19.7 mmol/L — ABNORMAL LOW (ref 20.0–28.0)
O2 Saturation: 84.4 %
Patient temperature: 37
pCO2, Ven: 43 mmHg — ABNORMAL LOW (ref 44–60)
pH, Ven: 7.27 (ref 7.25–7.43)
pO2, Ven: 55 mmHg — ABNORMAL HIGH (ref 32–45)

## 2023-04-12 LAB — POC OCCULT BLOOD, ED: Fecal Occult Bld: NEGATIVE

## 2023-04-12 LAB — HEMOGLOBIN AND HEMATOCRIT, BLOOD
HCT: 16.4 % — ABNORMAL LOW (ref 39.0–52.0)
HCT: 24.9 % — ABNORMAL LOW (ref 39.0–52.0)
Hemoglobin: 4.3 g/dL — CL (ref 13.0–17.0)
Hemoglobin: 7.6 g/dL — ABNORMAL LOW (ref 13.0–17.0)

## 2023-04-12 LAB — PREPARE RBC (CROSSMATCH)

## 2023-04-12 LAB — APTT: aPTT: 38 s — ABNORMAL HIGH (ref 24–36)

## 2023-04-12 LAB — PROTIME-INR
INR: 2.6 — ABNORMAL HIGH (ref 0.8–1.2)
Prothrombin Time: 27.6 s — ABNORMAL HIGH (ref 11.4–15.2)

## 2023-04-12 LAB — I-STAT CG4 LACTIC ACID, ED: Lactic Acid, Venous: 8.6 mmol/L (ref 0.5–1.9)

## 2023-04-12 LAB — ABO/RH: ABO/RH(D): O NEG

## 2023-04-12 MED ORDER — SODIUM CHLORIDE 0.9 % IV SOLN
2.0000 g | Freq: Once | INTRAVENOUS | Status: AC
  Administered 2023-04-12: 2 g via INTRAVENOUS
  Filled 2023-04-12: qty 12.5

## 2023-04-12 MED ORDER — SODIUM CHLORIDE 0.9% IV SOLUTION: Freq: Once | INTRAVENOUS | Status: DC

## 2023-04-12 MED ORDER — TRAZODONE HCL 50 MG PO TABS: 100.0000 mg | ORAL_TABLET | Freq: Every day | ORAL | Status: DC

## 2023-04-12 MED ORDER — ONDANSETRON HCL 4 MG/2ML IJ SOLN: 4.0000 mg | Freq: Four times a day (QID) | INTRAMUSCULAR | Status: DC | PRN

## 2023-04-12 MED ORDER — CARVEDILOL 3.125 MG PO TABS: 6.2500 mg | ORAL_TABLET | Freq: Two times a day (BID) | ORAL | Status: DC

## 2023-04-12 MED ORDER — CARVEDILOL 6.25 MG PO TABS
6.2500 mg | ORAL_TABLET | Freq: Two times a day (BID) | ORAL | Status: DC
  Administered 2023-04-12 – 2023-04-13 (×2): 6.25 mg via ORAL
  Filled 2023-04-12 (×2): qty 1

## 2023-04-12 MED ORDER — SODIUM CHLORIDE 0.9% IV SOLUTION: Freq: Once | INTRAVENOUS | Status: AC

## 2023-04-12 MED ORDER — ACETAMINOPHEN 325 MG PO TABS: 650.0000 mg | ORAL_TABLET | Freq: Four times a day (QID) | ORAL | Status: DC | PRN

## 2023-04-12 MED ORDER — SODIUM CHLORIDE 0.9 % IV BOLUS
1000.0000 mL | Freq: Once | INTRAVENOUS | Status: AC
  Administered 2023-04-12: 1000 mL via INTRAVENOUS

## 2023-04-12 MED ORDER — ACETAMINOPHEN 650 MG RE SUPP: 650.0000 mg | Freq: Four times a day (QID) | RECTAL | Status: DC | PRN

## 2023-04-12 MED ORDER — AMLODIPINE BESYLATE 5 MG PO TABS: 5.0000 mg | ORAL_TABLET | Freq: Every day | ORAL | Status: DC

## 2023-04-12 MED ORDER — FUROSEMIDE 10 MG/ML IJ SOLN
60.0000 mg | Freq: Two times a day (BID) | INTRAMUSCULAR | Status: DC
  Administered 2023-04-12 – 2023-04-14 (×4): 60 mg via INTRAVENOUS
  Filled 2023-04-12 (×4): qty 6

## 2023-04-12 MED ORDER — DULOXETINE HCL 30 MG PO CPEP: 60.0000 mg | ORAL_CAPSULE | Freq: Every day | ORAL | Status: DC

## 2023-04-12 MED ORDER — PANTOPRAZOLE SODIUM 40 MG IV SOLR
40.0000 mg | Freq: Two times a day (BID) | INTRAVENOUS | Status: DC
  Administered 2023-04-12 – 2023-04-15 (×7): 40 mg via INTRAVENOUS
  Filled 2023-04-12 (×7): qty 10

## 2023-04-12 MED ORDER — PREDNISOLONE ACETATE 1 % OP SUSP
1.0000 [drp] | Freq: Three times a day (TID) | OPHTHALMIC | Status: DC
  Administered 2023-04-12 – 2023-04-23 (×31): 1 [drp] via OPHTHALMIC
  Filled 2023-04-12: qty 5

## 2023-04-12 MED ORDER — VANCOMYCIN HCL 2000 MG/400ML IV SOLN
2000.0000 mg | Freq: Once | INTRAVENOUS | Status: AC
  Administered 2023-04-12: 2000 mg via INTRAVENOUS
  Filled 2023-04-12: qty 400

## 2023-04-12 MED ORDER — ADULT MULTIVITAMIN W/MINERALS CH
1.0000 | ORAL_TABLET | Freq: Every day | ORAL | Status: DC
  Administered 2023-04-13 – 2023-04-19 (×6): 1 via ORAL
  Filled 2023-04-12 (×7): qty 1

## 2023-04-12 MED ORDER — ONDANSETRON HCL 4 MG PO TABS: 4.0000 mg | ORAL_TABLET | Freq: Four times a day (QID) | ORAL | Status: DC | PRN

## 2023-04-12 NOTE — H&P (Addendum)
 History and Physical    Patient: [name] XLK:[id number] DOB: 16-Jul-1940 DOA: 04/12/2023 DOS: the patient was seen and examined on 04/12/2023 PCP: Kirby Funk, MD (Inactive)  Patient coming from: Home  Chief Complaint:  Chief Complaint  Patient presents with   Fatigue   HPI: [name] is a 83 y.o. 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  Post op outpatient follow up on 03/28 with no signs of infection, no active drainage.   On direct questioning patient is not feeling well, but denies any chest pain, dyspnea or left foot pain. Not able to tell if he had any bleeding from his surgical wound. He falls as sleep very easily when talking and he has decreased hearing. Not able to get detailed information.   I called his wife for more information:  Last couple of days patient has been not feeling well.  He was noted to have easy fatigability, and dyspnea on exertion, to the point where he did not wanted to get out of the bed any more. His po intake had significantly decreased over the last 48 hrs.   Not clear if he was having melena (in the past he had intermittent gastrointestinal bleeding). No significant bleeding from his left foot wound for the last 2 days, but it did bled when he got home.  Last night he was looking very ill, and this morning continue to rapidly deteriorate, prompting his to call EMS this morning.   Review of Systems: unable to review all systems due to the inability of the patient to answer questions. Past Medical History:  Diagnosis Date   Adenomatous colon polyp    Allergic rhinitis    Anemia    Aortic stenosis 07/30/2019   mild to moderate AS (AVA VTI 1.37 cm, AV mean gradient 15.0 mmHg, AV Vmax 2.56 m/s)   BMI 40.0-44.9, adult (HCC)     Chronic insomnia    Chronic low back pain    Constipation, unspecified    Diabetes mellitus (HCC) 2015   DJD (degenerative joint disease) of knee    Hypercalcemia    Hypertension    Hypertriglyceridemia    Junctional tachycardia (HCC)    Lumbar facet arthropathy    Morbid obesity (HCC)    Neuropathy    Nonallergic rhinitis    OSA (obstructive sleep apnea)    wife denies   Other chronic pain    PAF (paroxysmal atrial fibrillation) (HCC) 01/2020   Peripheral axonal neuropathy    Upper GI bleed    due to aspirin   Venous insufficiency    Venous stasis dermatitis of both lower extremities    Past Surgical History:  Procedure Laterality Date   AMPUTATION Right 08/30/2021   Procedure: Right index finger Amputaion;  Surgeon: Marlyne Beards, MD;  Location: [facility] OR;  Service: Orthopedics;  Laterality: Right;   AMPUTATION Left 03/16/2023   Procedure: AMPUTATION, FOOT, RAY;  Surgeon: Nadara Mustard, MD;  Location: [facility] OR;  Service: Orthopedics;  Laterality: Left;  LEFT FOOT 5TH RAY AMPUTATION 28810   cataract     Bilateral   CORNEAL TRANSPLANT Bilateral    GLAUCOMA SURGERY     HAND SURGERY Left    s/p amputation of left finger   I & D EXTREMITY Right 04/10/2020   Procedure: IRRIGATION AND DEBRIDEMENT AND AMPUTATION  OF RIGHT RING FINGER;  Surgeon: Bradly Bienenstock, MD;  Location: [facility] OR;  Service: Orthopedics;  Laterality: Right;   Social History:  reports that he has quit smoking. He has never used smokeless tobacco. He reports that he does not drink alcohol and does not use drugs.  Allergies  Allergen Reactions   Ambien [Zolpidem Tartrate] Other (See Comments)    Confusion Memory loss   Aspirin Other (See Comments)    Bleeding ulcer   Cosopt [Dorzolamide Hcl-Timolol Mal] Other (See Comments)    Red and burning eyes   Diamox [Acetazolamide] Other (See Comments)    Unknown reaction    Family History  Problem Relation Age of Onset   Gout Father    Osteoarthritis Mother         Deceased, 46   Healthy Brother        x2   Healthy Son        x2    Prior to Admission medications   Medication Sig Start Date End Date Taking? Authorizing Provider  acetaminophen (TYLENOL) 650 MG CR tablet Take 650-1,300 mg by mouth every 8 (eight) hours as needed for pain.    [provider]  amLODipine (NORVASC) 5 MG tablet Take 5 mg by mouth daily. 07/03/21   [provider]  apixaban (ELIQUIS) 5 MG TABS tablet Take 5 mg by mouth 2 (two) times daily.    [provider]  carvedilol (COREG) 6.25 MG tablet Take 6.25 mg by mouth 2 (two) times daily with a meal. 02/09/21   [provider]  Dulaglutide (TRULICITY) 1.5 MG/0.5ML SOPN Inject 1.5 mg into the skin every Saturday.    [provider]  DULoxetine (CYMBALTA) 60 MG capsule Take 60 mg by mouth daily.    [provider]  gabapentin (NEURONTIN) 600 MG tablet Take 600 mg by mouth 2 (two) times daily.    [provider]  metFORMIN (GLUCOPHAGE) 500 MG tablet Take 2 tablets (1,000 mg total) by mouth 2 (two) times daily with a meal. Patient taking differently: Take 500 mg by mouth daily with breakfast. 08/31/21   Rai, Ripudeep K, MD  Multiple Vitamin (MULTIVITAMIN) tablet Take 1 tablet by mouth daily.    [provider]  oxyCODONE-acetaminophen (PERCOCET/ROXICET) 5-325 MG tablet Take 1 tablet by mouth in the morning and at bedtime. 08/31/21   Rai, Delene Ruffini, MD  prednisoLONE acetate (PRED FORTE) 1 % ophthalmic suspension Place 1 drop into the left eye 3 (three) times daily. 02/09/21   [provider]  traZODone (DESYREL) 100 MG tablet Take 100 mg by mouth at bedtime. 02/09/21   [provider]    Physical Exam: Vitals:   04/12/23 0851 04/12/23 0900 04/12/23 0934 04/12/23 1045  BP: 104/80 113/63  112/62  Pulse: 74   68  Resp: 16 14    Temp:   (!) 97.5 F (36.4 C)   TempSrc:   Rectal   SpO2: 97%   93%  Weight:      Height:       Neurology patient is  somnolent but easy to arouse, he has decreased hearing, able to answer yes and no questions, and follow simple commands. ENT with pallor with no icterus, dry mucous membranes.  Cardiovascular with S1 and S2 present and regular, positive systolic murmur at the base with no gallops or rubs Respiratory with bilateral rales on anterior auscultation, diffusely, with no wheezing or rhonchi Abdomen not distended or tender, no ecchymosis  No lower extremity  edema  Left foot surgical wound with crust in place and signs of active bleeding, small amount of drainage. No erythema or local edema.     Data Reviewed:   Na 133, K 5,1 Cl 98 bicarbonate 18 glucose 258, bun 55 cr 1,91  Anion gap 17  AST 686 ALT 577 BNP 1,901  Lactic acid 8.6 Wbc 14.8 hgb 4.1 (from 9,1 on 03/16/23), plt 395  Fecal occult test negative   CT head with no acute changes.   Chest radiograph with cardiomegaly, with bilateral hilar vascular congestion, bilateral central interstitial infiltrates, with bilateral pleural effusions.    Assessment and Plan: * Symptomatic anemia Patient had hgb on 9,1 pre operative on 03/07  He had bleeding from his surgical wound after his discharge, and he has been taking apixaban for anticoagulation.  No clear if patient had melena this time, but per his wife he had intermittent gastro intestinal bleeding in the past.   Plan to continue PRBC transfusion 2 units and have close follow upon H&H Add Pantoprazole IV bid for now.  Hold on apixaban.   Acute on chronic diastolic CHF (congestive heart failure) (HCC) Patient with clinically signs of volume overload. He does have history of aortic stenosis.   Plan to start diuresis with furosemide 60 mg IV bid and follow up on echocardiogram.  For now will hold amlodipine due to risk of hypotension.  Continue low dose carvedilol.  Continue telemetry monitoring.   Elevated liver enzymes possible liver congestion, follow up LFT in am.   Acute  metabolic encephalopathy Multifactorial encephalopathy.   Continue supportive medical therapy including PRBC transfusion and neuro checks Consult nutrition for recommendations.  Add thiamine and multivitamins Hold on duloxetine, trazodone and gabapentin for now.   Essential hypertension Continue blood pressure monitoring, hold on amlodipine.   Chronic kidney disease, stage 3a (HCC) AKI, Hyponatremia, Lactic acidosis.   Continue diuresis with furosemide, follow up renal function and electrolytes.  Avoid hypotension and nephrotoxic medications.    PAF (paroxysmal atrial fibrillation) (HCC) Continue carvedilol for rate control.  Hold on anticoagulation in the setting of acute anemia.  Continue telemetry monitoring.   Type 2 diabetes mellitus with obesity (HCC) Add insulin sliding scale for glucose cover and monitoring.  Follow up capillary glucose Hold on oral hypoglycemic agents.   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  Will hold on antibiotic therapy for now with close follow up.  Low threshold to resume antibiotics.       Advance Care Planning:   Code Status: Full Code   Consults: none   Family Communication: I spoke with patient's wife over the phone, we talked in detail about patient's condition, plan of care and prognosis and all questions were addressed.   Severity of Illness: The appropriate patient status for this patient is INPATIENT. Inpatient status is judged to be reasonable and necessary in order to provide the required intensity of service to ensure the patient's safety. The patient's presenting symptoms, physical exam findings, and initial radiographic and laboratory data in the context of their chronic comorbidities is felt to place them at high risk for further clinical deterioration. Furthermore, it is  not anticipated that the patient will be medically stable for discharge from the hospital within 2 midnights of admission.   * I certify that at the point of admission it  is my clinical judgment that the patient will require inpatient hospital care spanning beyond 2 midnights from the point of admission due to high intensity of service, high risk for further deterioration and high frequency of surveillance required.*  Author: Coralie Keens, MD 04/12/2023 12:04 PM  For on call review [email or web address].

## 2023-04-12 NOTE — Assessment & Plan Note (Addendum)
 No clinical signs of active bleeding.  Follow up hgb is 8.9   Has been on IV pantoprazole for 72 hrs, will transition to oral pantoprazole 40 mg daily  No signs of active bleeding, no melena  Check iron stores tomorrow

## 2023-04-12 NOTE — ED Triage Notes (Signed)
 Pt bib [facility] from home. Patients wife called out due to patient being lethargic. When ems arrive patient c/o sob. Apt is A&OX4. Pt wife said he has not been the same since surgery on foot infection. Unknown of how long ago surgery was.     104/80bp manual  397 cbg 74 HR   Pt is on carvedilol  Pt is on eliquis  Unknown medical hx.

## 2023-04-12 NOTE — ED Notes (Signed)
MD notified of critical value  

## 2023-04-12 NOTE — Assessment & Plan Note (Addendum)
 AKI, Hyponatremia, Lactic acidosis. Hypokalemia.   Renal function with serum cr at 1,72 with K at 2,6 and serum bicarbonate at 33  Na 141.   Continue K correction with 40 Kcl meq KV and 40 Kcl  meq po Hold on furosemide.  Follow up renal function and electrolytes in am.

## 2023-04-12 NOTE — ED Notes (Addendum)
Patient transported to CT w/ primary RN

## 2023-04-12 NOTE — Assessment & Plan Note (Signed)
 Calculated BMI is 33. 9

## 2023-04-12 NOTE — Assessment & Plan Note (Addendum)
 Hyperglycemia.  Continue insulin sliding scale for glucose cover and monitoring.  Hold on basal insulin for now due to poor oral intake and risk of hypoglycemia.  Hold on oral hypoglycemic agents.   Glucose 225 mg this am, if tolerating well po will add basal insulin.

## 2023-04-12 NOTE — ED Provider Notes (Signed)
 [place] [facility] AT [facility] Provider Note   CSN: [id number] Arrival date & time: 04/12/23  0840     History  Chief Complaint  Patient presents with   Fatigue    [name] is a 83 y.o. male.  See ED course for HPI.  83 year old male presented with lethargy.        Home Medications Prior to Admission medications   Medication Sig Start Date End Date Taking? Authorizing Provider  acetaminophen (TYLENOL) 650 MG CR tablet Take 650-1,300 mg by mouth every 8 (eight) hours as needed for pain.    [provider]  amLODipine (NORVASC) 5 MG tablet Take 5 mg by mouth daily. 07/03/21   [provider]  apixaban (ELIQUIS) 5 MG TABS tablet Take 5 mg by mouth 2 (two) times daily.    [provider]  carvedilol (COREG) 6.25 MG tablet Take 6.25 mg by mouth 2 (two) times daily with a meal. 02/09/21   [provider]  Dulaglutide (TRULICITY) 1.5 MG/0.5ML SOPN Inject 1.5 mg into the skin every Saturday.    [provider]  DULoxetine (CYMBALTA) 60 MG capsule Take 60 mg by mouth daily.    [provider]  gabapentin (NEURONTIN) 600 MG tablet Take 600 mg by mouth 2 (two) times daily.    [provider]  metFORMIN (GLUCOPHAGE) 500 MG tablet Take 2 tablets (1,000 mg total) by mouth 2 (two) times daily with a meal. Patient taking differently: Take 500 mg by mouth daily with breakfast. 08/31/21   Rai, Ripudeep K, MD  Multiple Vitamin (MULTIVITAMIN) tablet Take 1 tablet by mouth daily.    [provider]  oxyCODONE-acetaminophen (PERCOCET/ROXICET) 5-325 MG tablet Take 1 tablet by mouth in the morning and at bedtime. 08/31/21   Rai, Delene Ruffini, MD  prednisoLONE acetate (PRED FORTE) 1 % ophthalmic suspension Place 1 drop into the left eye 3 (three) times daily. 02/09/21   [provider]  traZODone (DESYREL) 100 MG tablet Take 100 mg by mouth at bedtime. 02/09/21   [provider]       Allergies    Ambien [zolpidem tartrate], Aspirin, Cosopt [dorzolamide hcl-timolol mal], and Diamox [acetazolamide]    Review of Systems   Review of Systems  Physical Exam Updated Vital Signs BP 112/62   Pulse 68   Temp (!) 97.5 F (36.4 C) (Rectal)   Resp 14   Ht 5\' 9"  (1.753 m)   Wt 104.3 kg   SpO2 93%   BMI 33.97 kg/m  Physical Exam  ED Results / Procedures / Treatments   Labs (all labs ordered are listed, but only abnormal results are displayed) Labs Reviewed  COMPREHENSIVE METABOLIC PANEL WITH GFR - Abnormal; Notable for the following components:      Result Value   Sodium 133 (*)    CO2 18 (*)    Glucose, Bld 258 (*)    BUN 55 (*)    Creatinine, Ser 1.91 (*)    Albumin 2.5 (*)    AST 686 (*)    ALT 577 (*)    Total Bilirubin 1.9 (*)    GFR, Estimated 34 (*)    Anion gap 17 (*)    All other components within normal limits  CBC WITH DIFFERENTIAL/PLATELET - Abnormal; Notable for the following components:   WBC 14.8 (*)    RBC 1.85 (*)    Hemoglobin 4.1 (*)    HCT 15.4 (*)    MCH 22.2 (*)  MCHC 26.6 (*)    RDW 17.2 (*)    nRBC 2.4 (*)    Neutro Abs 12.7 (*)    Abs Immature Granulocytes 0.11 (*)    All other components within normal limits  PROTIME-INR - Abnormal; Notable for the following components:   Prothrombin Time 27.6 (*)    INR 2.6 (*)    All other components within normal limits  APTT - Abnormal; Notable for the following components:   aPTT 38 (*)    All other components within normal limits  BRAIN NATRIURETIC PEPTIDE - Abnormal; Notable for the following components:   B Natriuretic Peptide 1,901.9 (*)    All other components within normal limits  HEMOGLOBIN AND HEMATOCRIT, BLOOD - Abnormal; Notable for the following components:   Hemoglobin 4.3 (*)    HCT 16.4 (*)    All other components within normal limits  I-STAT CG4 LACTIC ACID, ED - Abnormal; Notable for the following components:   Lactic Acid, Venous 8.6 (*)    All other  components within normal limits  CULTURE, BLOOD (ROUTINE X 2)  CULTURE, BLOOD (ROUTINE X 2)  URINALYSIS, W/ REFLEX TO CULTURE (INFECTION SUSPECTED)  OCCULT BLOOD X 1 CARD TO LAB, STOOL  BLOOD GAS, VENOUS  I-STAT CG4 LACTIC ACID, ED  TYPE AND SCREEN  PREPARE RBC (CROSSMATCH)  ABO/RH  PREPARE RBC (CROSSMATCH)    EKG EKG Interpretation Date/Time:  Thursday April 12 2023 08:55:21 EDT Ventricular Rate:  74 PR Interval:  186 QRS Duration:  102 QT Interval:  441 QTC Calculation: 490 R Axis:   18  Text Interpretation: Sinus rhythm Borderline repolarization abnormality Borderline prolonged QT interval Confirmed by Estanislado Pandy [phone]) on 04/12/2023 10:59:14 AM  Radiology US Abdomen Limited RUQ (LIVER/GB) Result Date: 04/12/2023 CLINICAL DATA:  Transaminitis EXAM: ULTRASOUND ABDOMEN LIMITED RIGHT UPPER QUADRANT COMPARISON:  Ultrasound 02/27/2021 FINDINGS: Gallbladder: No gallstones or wall thickening visualized. No sonographic Murphy sign noted by sonographer. Common bile duct: Diameter: 3 mm Liver: Diffusely echogenic hepatic parenchyma consistent with fatty liver infiltration. With this level of echogenicity evaluation for underlying mass lesion is limited and if needed follow-up contrast CT or MRI as clinically appropriate. Portal vein is patent on color Doppler imaging with normal direction of blood flow towards the liver. Other: Mild ascites IMPRESSION: Fatty liver infiltration. No gallstones or ductal dilatation. Mild ascites Electronically Signed   By: Karen Kays M.D.   On: 04/12/2023 11:56   CT Head Wo Contrast Result Date: 04/12/2023 CLINICAL DATA:  Mental status change, unknown cause. EXAM: CT HEAD WITHOUT CONTRAST TECHNIQUE: Contiguous axial images were obtained from the base of the skull through the vertex without intravenous contrast. RADIATION DOSE REDUCTION: This exam was performed according to the departmental dose-optimization program which includes automated exposure control,  adjustment of the mA and/or kV according to patient size and/or use of iterative reconstruction technique. COMPARISON:  MRI and CT scan head from 02/25/2021. FINDINGS: Brain: No evidence of acute infarction, hemorrhage, hydrocephalus, extra-axial collection or mass lesion/mass effect. There is bilateral periventricular hypodensity, which is non-specific but most likely seen in the settings of microvascular ischemic changes. Mild in extent. Otherwise normal appearance of brain parenchyma. Ventricles are normal. Cerebral volume is age appropriate. Vascular: No hyperdense vessel or unexpected calcification. Intracranial arteriosclerosis. Skull: Normal. Negative for fracture or focal lesion. Sinuses/Orbits: No acute finding. Other: Visualized mastoid air cells are unremarkable. No mastoid effusion. IMPRESSION: *No acute intracranial abnormality. Electronically Signed   By: Jules Schick M.D.   On:  04/12/2023 10:09   DG Chest Port 1 View Result Date: 04/12/2023 CLINICAL DATA:  Questionable sepsis - evaluate for abnormality. Altered mental status. Shortness of breath. EXAM: PORTABLE CHEST 1 VIEW COMPARISON:  02/25/2021. FINDINGS: Diffuse moderate-to-severe pulmonary vascular congestion and bilateral layering pleural effusions. There are probable associated compressive atelectatic changes at the lung bases. No pneumothorax. Mildly enlarged cardio-mediastinal silhouette. No acute osseous abnormalities. The soft tissues are within normal limits. IMPRESSION: *Findings favor congestive heart failure/pulmonary edema. Electronically Signed   By: Jules Schick M.D.   On: 04/12/2023 10:01   DG Foot 2 Views Left Result Date: 04/12/2023 CLINICAL DATA:  Questionable sepsis - evaluate for abnormality. Altered mental status. Lethargic. EXAM: LEFT FOOT - 2 VIEW COMPARISON:  02/27/2023. FINDINGS: Since the prior study, patient underwent proximal transmetatarsal amputation of fifth toe. There is small amount of air in the surgical  bed. The resection margin appear sharp. No acute fracture or dislocation. No aggressive osseous lesion. Mild hallux valgus deformity noted. Ankle mortise appears intact. No focal soft tissue swelling. No radiopaque foreign bodies. IMPRESSION: *Status post proximal transmetatarsal amputation of fifth toe. No acute osseous abnormality. No radiographic evidence of acute osteomyelitis. Electronically Signed   By: Jules Schick M.D.   On: 04/12/2023 10:00    Procedures .Critical Care  Performed by: Coral Spikes, DO Authorized by: Coral Spikes, DO   Critical care provider statement:    Critical care time (minutes):  30   Critical care was necessary to treat or prevent imminent or life-threatening deterioration of the following conditions:  Sepsis and circulatory failure   Critical care was time spent personally by me on the following activities:  Development of treatment plan with patient or surrogate, discussions with consultants, evaluation of patient's response to treatment, examination of patient, ordering and review of laboratory studies, ordering and review of radiographic studies, ordering and performing treatments and interventions, pulse oximetry, re-evaluation of patient's condition and review of old charts   Care discussed with: admitting provider       Medications Ordered in ED Medications  vancomycin (VANCOREADY) IVPB 2000 mg/400 mL (2,000 mg Intravenous New Bag/Given 04/12/23 1037)  0.9 %  sodium chloride infusion (Manually program via Guardrails IV Fluids) (has no administration in time range)  0.9 %  sodium chloride infusion (Manually program via Guardrails IV Fluids) (has no administration in time range)  sodium chloride 0.9 % bolus 1,000 mL (1,000 mLs Intravenous New Bag/Given 04/12/23 0929)  ceFEPIme (MAXIPIME) 2 g in sodium chloride 0.9 % 100 mL IVPB (2 g Intravenous New Bag/Given 04/12/23 1011)  sodium chloride 0.9 % bolus 1,000 mL (1,000 mLs Intravenous New Bag/Given 04/12/23  1026)    ED Course/ Medical Decision Making/ A&P Clinical Course as of 04/12/23 1205  Thu Apr 12, 2023  0850 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  Poor historian, unable to provide much history.  EMS notes that wife reported gradual lethargy since recent toe amputation.  Seemingly more confused/lethargic this morning.  Left lower extremity with possible wound dehiscence, no overt purulence or erythema.  Will get broad altered mental status workup. [TY]  [id number] Echo 07/30/19 :"1. Left ventricular ejection fraction, by estimation, is 55 to 60%. The  left ventricle has normal function. The left ventricle has no regional  wall motion abnormalities. Left ventricular diastolic parameters are  consistent with Grade I diastolic  dysfunction (impaired relaxation). The average left ventricular global  longitudinal strain is -  21.4 %. The global longitudinal strain is normal.  " [TY]  0928 Lactic Acid, Venous(!!): 8.6 [TY]  1000 DG Chest Port 1 View Appears to have pneumonia versus pulmonary edema.  He is borderline hypothermic and has an elevated lactate.  Will cover with antibiotics [TY]  1017 DG Chest Port 1 View IMPRESSION: *Findings favor congestive heart failure/pulmonary edema.   Electronically Signed   [TY]  1041 CT Head Wo Contrast IMPRESSION: *No acute intracranial abnormality.   [TY]  1041 IMPRESSION: *Status post proximal transmetatarsal amputation of fifth toe. No acute osseous abnormality. No radiographic evidence of acute osteomyelitis.   [TY]  1049 Hemoglobin(!!): 4.1 No overt source of bleeding. Hemacult ordered. DRE without gross melana. 2 units of PRBCs ordered.  [TY]  1059 Patient with some soft blood pressure readings, but has improved with IV fluids.  Nonlabored breathing.  [TY]  1102 Final MDM; 83 year old recent osteomyelitis/toe amputation, diabetes, hypertension, hyperlipidemia, A-fib on Eliquis presented with  lethargy.  CT head negative for acute intracranial pathology.  Nonlocalizing neuroexam.  Low suspicion for acute stroke.  Appears to have AKI, transaminitis.  He has a benign abdominal exam with no tenderness.  Concern for possible infectious etiology given his recent osteomyelitis, chest x-ray on my review appears to have pneumonia, however radiology reading as CHF.  Clinically patient appears volume down and has no history of CHF.  He is also hypothermic, leukocytosis, and elevated lactate.  Covered with Vanco and cefepime, received 2 L of IV fluids.  CBC also noted with anemia with a hemoglobin of 4.1.  2 units of blood ordered.  Digital rectal exam without gross melena.  Hemoccult ordered.  Given patient's age, comorbid medical condition, with findings concerning for infection as well as acute anemia we will plan for admission. [TY]  1203 Spoke with hospitalist; agrees to see admit patient. [TY]    Clinical Course User Index [TY] Coral Spikes, DO                                 Medical Decision Making See ED course for MDM  Amount and/or Complexity of Data Reviewed Independent Historian:     Details: EMS noted stable vitals.  They also note seemingly progressive symptoms since surgery External Data Reviewed:     Details: See ED course Labs: ordered. Decision-making details documented in ED Course.    Details: See ED course Radiology: ordered and independent interpretation performed. Decision-making details documented in ED Course.    Details: Ultrasound without significant gallbladder wall thickening,, duct appears to be normal on my independent interpretation.  Risk Prescription drug management. Decision regarding hospitalization.         Final Clinical Impression(s) / ED Diagnoses Final diagnoses:  None    Rx / DC Orders ED Discharge Orders     None         Coral Spikes, DO 04/12/23 1205

## 2023-04-12 NOTE — Assessment & Plan Note (Addendum)
 Multifactorial encephalopathy, intermittent agitation, consistent with delirium.  Clinically improving.   Tolerating well dysphagia 3 with aspiration precautions.  Pain control with oxycodone and hydromorphone.  Haldol as needed for agitation. Continue high dose thiamine and multivitamins.  Neuro checks per unit protocol.

## 2023-04-12 NOTE — Assessment & Plan Note (Addendum)
 Continue blood pressure monitoring discontinue amlodipine.

## 2023-04-12 NOTE — ED Notes (Signed)
 Pt verbally consented to blood, pt unable to sign due to weakness. Blood consent electgronically uploaded

## 2023-04-12 NOTE — Progress Notes (Signed)
 ED Pharmacy Antibiotic Sign Off An antibiotic consult was received from an ED provider for cefepime and vancomycin per pharmacy dosing for  osteomyelitis . A chart review was completed to assess appropriateness.  The following one time order(s) were placed per pharmacy consult:  cefepime 2 gm x 1 dose vancomycin 2000 mg x 1 dose  Further antibiotic and/or antibiotic pharmacy consults should be ordered by the admitting provider if indicated.   Thank you for allowing pharmacy to be a part of this patient's care.   Enos Fling, PharmD PGY-1 Acute Care Pharmacy Resident 04/12/2023 9:52 AM

## 2023-04-12 NOTE — Assessment & Plan Note (Addendum)
 Patient with atrial fibrillation and atrial flutter with rapid ventricular response.   Plan to continue for now with carvedilol, with caution due to low EF  If worsening may need amiodarone, but also need caution since he is off anticoagulation.  If no signs of bleeding today and hgb stable, may resume IV heparin tomorrow.   Keep K at 4 and Mg at 2.  Treat pain and agitation.

## 2023-04-12 NOTE — Assessment & Plan Note (Addendum)
 Echocardiogram with reduced LV systolic function 30 to 35%, global hypokinesis, RV systolic function preserved, RVSP 35.2 mmHg, LA and RA with mild dilatation, severe mitral valve regurgitation, moderate mitral valve stenosis, moderate aortic valve stenosis, mild to moderate tricuspid valve regurgitation, no pericardial effusion   Urine output is 750 cc, since admission is - 6,099ml.  Systolic blood pressure 100 mmHg range  Continue carvedilol for atrial fibrillation rate control. (Caution due to low EF)   Continue to hold on diuresis due to risk of hypotension  Hold on RAAS inhibition due to risk of hypotension   Elevated liver enzymes possible liver congestion, liver enzymes are trending down with ALT at 175 and AST at 327

## 2023-04-12 NOTE — Assessment & Plan Note (Addendum)
 Sp amputation, wbc rising now at 13.3   Patient had antibiotic therapy in the ED vancomycin and cefepime.   Will resume patient on antibiotic therapy with Vancomycin and cefepime, will  consult orthopedics in am to follow up on wound.

## 2023-04-13 ENCOUNTER — Inpatient Hospital Stay (HOSPITAL_COMMUNITY)

## 2023-04-13 DIAGNOSIS — M869 Osteomyelitis, unspecified: Secondary | ICD-10-CM

## 2023-04-13 DIAGNOSIS — D649 Anemia, unspecified: Secondary | ICD-10-CM | POA: Diagnosis not present

## 2023-04-13 DIAGNOSIS — I5033 Acute on chronic diastolic (congestive) heart failure: Secondary | ICD-10-CM | POA: Diagnosis not present

## 2023-04-13 DIAGNOSIS — I10 Essential (primary) hypertension: Secondary | ICD-10-CM | POA: Diagnosis not present

## 2023-04-13 DIAGNOSIS — R0609 Other forms of dyspnea: Secondary | ICD-10-CM | POA: Diagnosis not present

## 2023-04-13 DIAGNOSIS — G9341 Metabolic encephalopathy: Secondary | ICD-10-CM | POA: Diagnosis not present

## 2023-04-13 LAB — BASIC METABOLIC PANEL WITH GFR
Anion gap: 16 — ABNORMAL HIGH (ref 5–15)
BUN: 55 mg/dL — ABNORMAL HIGH (ref 8–23)
CO2: 22 mmol/L (ref 22–32)
Calcium: 8.5 mg/dL — ABNORMAL LOW (ref 8.9–10.3)
Chloride: 99 mmol/L (ref 98–111)
Creatinine, Ser: 1.67 mg/dL — ABNORMAL HIGH (ref 0.61–1.24)
GFR, Estimated: 40 mL/min — ABNORMAL LOW (ref >60)
Glucose, Bld: 243 mg/dL — ABNORMAL HIGH (ref 70–99)
Potassium: 3.7 mmol/L (ref 3.5–5.1)
Sodium: 137 mmol/L (ref 135–145)

## 2023-04-13 LAB — ECHOCARDIOGRAM COMPLETE
AR max vel: 1.2 cm2
AV Area VTI: 1.31 cm2
AV Area mean vel: 1.19 cm2
AV Mean grad: 24 mmHg
AV Peak grad: 33.2 mmHg
Ao pk vel: 2.88 m/s
Area-P 1/2: 6.96 cm2
Height: 69 in
MV M vel: 4.99 m/s
MV Peak grad: 99.6 mmHg
MV VTI: 2.02 cm2
Radius: 1.3 cm
S' Lateral: 2.9 cm
Weight: 3545 [oz_av]

## 2023-04-13 LAB — CBC
HCT: 24 % — ABNORMAL LOW (ref 39.0–52.0)
Hemoglobin: 7.6 g/dL — ABNORMAL LOW (ref 13.0–17.0)
MCH: 24.8 pg — ABNORMAL LOW (ref 26.0–34.0)
MCHC: 31.7 g/dL (ref 30.0–36.0)
MCV: 78.2 fL — ABNORMAL LOW (ref 80.0–100.0)
Platelets: 309 10*3/uL (ref 150–400)
RBC: 3.07 MIL/uL — ABNORMAL LOW (ref 4.22–5.81)
RDW: 16 % — ABNORMAL HIGH (ref 11.5–15.5)
WBC: 12.2 10*3/uL — ABNORMAL HIGH (ref 4.0–10.5)
nRBC: 3.9 % — ABNORMAL HIGH (ref 0.0–0.2)

## 2023-04-13 LAB — GLUCOSE, CAPILLARY
Glucose-Capillary: 278 mg/dL — ABNORMAL HIGH (ref 70–99)
Glucose-Capillary: 200 mg/dL — ABNORMAL HIGH (ref 70–99)
Glucose-Capillary: 221 mg/dL — ABNORMAL HIGH (ref 70–99)

## 2023-04-13 LAB — PREPARE RBC (CROSSMATCH)

## 2023-04-13 LAB — LACTIC ACID, PLASMA: Lactic Acid, Venous: 1.7 mmol/L (ref 0.5–1.9)

## 2023-04-13 MED ORDER — CARVEDILOL 12.5 MG PO TABS: 12.5000 mg | ORAL_TABLET | Freq: Once | ORAL | Status: DC

## 2023-04-13 MED ORDER — METOPROLOL TARTRATE 5 MG/5ML IV SOLN
2.5000 mg | INTRAVENOUS | Status: DC | PRN
  Administered 2023-04-14 – 2023-04-19 (×8): 2.5 mg via INTRAVENOUS
  Filled 2023-04-13 (×9): qty 5

## 2023-04-13 MED ORDER — INSULIN ASPART 100 UNIT/ML IJ SOLN
0.0000 [IU] | Freq: Three times a day (TID) | INTRAMUSCULAR | Status: DC
  Administered 2023-04-13 – 2023-04-14 (×3): 3 [IU] via SUBCUTANEOUS
  Administered 2023-04-14: 9 [IU] via SUBCUTANEOUS
  Administered 2023-04-15: 3 [IU] via SUBCUTANEOUS
  Administered 2023-04-15 (×2): 7 [IU] via SUBCUTANEOUS
  Administered 2023-04-16: 3 [IU] via SUBCUTANEOUS

## 2023-04-13 MED ORDER — CARVEDILOL 12.5 MG PO TABS
12.5000 mg | ORAL_TABLET | Freq: Two times a day (BID) | ORAL | Status: DC
  Administered 2023-04-13 – 2023-04-15 (×4): 12.5 mg via ORAL
  Filled 2023-04-13 (×5): qty 1

## 2023-04-13 MED ORDER — CARVEDILOL 6.25 MG PO TABS
6.2500 mg | ORAL_TABLET | Freq: Once | ORAL | Status: AC
  Administered 2023-04-13: 6.25 mg via ORAL
  Filled 2023-04-13: qty 1

## 2023-04-13 MED ORDER — HALOPERIDOL 1 MG PO TABS
1.0000 mg | ORAL_TABLET | Freq: Four times a day (QID) | ORAL | Status: DC | PRN
  Administered 2023-04-14 – 2023-04-17 (×3): 1 mg via ORAL
  Filled 2023-04-13 (×4): qty 1

## 2023-04-13 MED ORDER — INSULIN ASPART 100 UNIT/ML IJ SOLN
0.0000 [IU] | Freq: Every day | INTRAMUSCULAR | Status: DC
  Administered 2023-04-13 – 2023-04-14 (×2): 2 [IU] via SUBCUTANEOUS
  Administered 2023-04-15: 4 [IU] via SUBCUTANEOUS

## 2023-04-13 MED ORDER — ALBUMIN HUMAN 25 % IV SOLN
25.0000 g | Freq: Once | INTRAVENOUS | Status: AC
  Administered 2023-04-13: 25 g via INTRAVENOUS
  Filled 2023-04-13: qty 100

## 2023-04-13 MED ORDER — HALOPERIDOL LACTATE 5 MG/ML IJ SOLN
1.0000 mg | Freq: Four times a day (QID) | INTRAMUSCULAR | Status: DC | PRN
  Administered 2023-04-13: 1 mg via INTRAMUSCULAR
  Filled 2023-04-13: qty 1

## 2023-04-13 MED ORDER — THIAMINE MONONITRATE 100 MG PO TABS
500.0000 mg | ORAL_TABLET | Freq: Every day | ORAL | Status: AC
  Administered 2023-04-14 – 2023-04-17 (×3): 500 mg via ORAL
  Filled 2023-04-13 (×4): qty 5

## 2023-04-13 MED ORDER — SODIUM CHLORIDE 0.9% IV SOLUTION: Freq: Once | INTRAVENOUS | Status: DC

## 2023-04-13 MED ORDER — HYDROMORPHONE HCL 1 MG/ML IJ SOLN
1.0000 mg | INTRAMUSCULAR | Status: DC | PRN
  Administered 2023-04-13 – 2023-04-18 (×5): 1 mg via INTRAVENOUS
  Filled 2023-04-13 (×6): qty 1

## 2023-04-13 NOTE — Progress Notes (Signed)
   04/13/23 1348  TOC Brief Assessment  Insurance and Status Reviewed  Patient has primary care physician Yes  Home environment has been reviewed home w/ spouse  Prior level of function: independent  Prior/Current Home Services No current home services  Social Drivers of Health Review SDOH reviewed no interventions necessary  Readmission risk has been reviewed Yes  Transition of care needs no transition of care needs at this time    We will continue to monitor patient advancement through interdisciplinary progression rounds. If new patient transition needs arise, please place a TOC consult.

## 2023-04-13 NOTE — Consult Note (Signed)
 VAT: Consult for PIV placement  Pt confused and agitated. 3xperson assist for IV placement. PIV placed successfully. Care RN at bedside.   Consult complete

## 2023-04-13 NOTE — Evaluation (Signed)
 Clinical/Bedside Swallow Evaluation Patient Details  Name: [name] MRN: [id number] Date of Birth: 1940/11/22  Today's Date: 04/13/2023 Time: SLP Start Time (ACUTE ONLY): 1034 SLP Stop Time (ACUTE ONLY): 1042 SLP Time Calculation (min) (ACUTE ONLY): 8 min  Past Medical History:  Past Medical History:  Diagnosis Date   Adenomatous colon polyp    Allergic rhinitis    Anemia    Aortic stenosis 07/30/2019   mild to moderate AS (AVA VTI 1.37 cm, AV mean gradient 15.0 mmHg, AV Vmax 2.56 m/s)   BMI 40.0-44.9, adult (HCC)    Chronic insomnia    Chronic low back pain    Constipation, unspecified    Diabetes mellitus (HCC) 2015   DJD (degenerative joint disease) of knee    Hypercalcemia    Hypertension    Hypertriglyceridemia    Junctional tachycardia (HCC)    Lumbar facet arthropathy    Morbid obesity (HCC)    Neuropathy    Nonallergic rhinitis    OSA (obstructive sleep apnea)    wife denies   Other chronic pain    PAF (paroxysmal atrial fibrillation) (HCC) 01/2020   Peripheral axonal neuropathy    Upper GI bleed    due to aspirin   Venous insufficiency    Venous stasis dermatitis of both lower extremities    Past Surgical History:  Past Surgical History:  Procedure Laterality Date   AMPUTATION Right 08/30/2021   Procedure: Right index finger Amputaion;  Surgeon: Marlyne Beards, MD;  Location: [facility] OR;  Service: Orthopedics;  Laterality: Right;   AMPUTATION Left 03/16/2023   Procedure: AMPUTATION, FOOT, RAY;  Surgeon: Nadara Mustard, MD;  Location: [facility] OR;  Service: Orthopedics;  Laterality: Left;  LEFT FOOT 5TH RAY AMPUTATION 28810   cataract     Bilateral   CORNEAL TRANSPLANT Bilateral    GLAUCOMA SURGERY     HAND SURGERY Left    s/p amputation of left finger   I & D EXTREMITY Right 04/10/2020   Procedure: IRRIGATION AND DEBRIDEMENT AND AMPUTATION OF RIGHT RING FINGER;  Surgeon: Bradly Bienenstock, MD;  Location: [facility] OR;  Service: Orthopedics;  Laterality: Right;   HPI:   [name] is a 83 y.o. male who, per chart review, was noted by wife at home to have easy fatigability, and dyspnea on exertion, to the point where he did not wanted to get out of the bed any more. His po intake had significantly decreased over the last 48 hrs. CXR 4/3 suggestive of CHF or pulmonary edema.  Head CT 4/3 without acute findings.  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    Assessment / Plan / Recommendation  Clinical Impression  Pt presents with clinical indicators of pharyngeal dysphagia.  Pt noted to cough on 3 of 4 trials of thin liquid.  Cough dry sounding, and in 1-2 cough epoch.  Coughing was eliminated with nectar thick liquids. Pt noted to have impulsivity while drinking.  All trials were serial straw sips.  Pinching straw and removing from oral cavity to limit bolus flow was difficult to execute.  Pt tolerated single trial of puree and soft solid texture. He was unable to bite into regular solid cracker.  Pt would benefit from further assessment of pharyngeal swallow function.  With suspicion of GIB, will check with MD to determine  if pt is medically ready for MBSS. At present pt can continue clear liquid diet, but recommend nectar thick consistency.  Pt may advance up to mechanical soft solid texture when he is cleared to have soild foods.    Recommend clear liquid diet with nectar thick liquid.   SLP Visit Diagnosis: Dysphagia, unspecified (R13.10)    Aspiration Risk  Mild aspiration risk    Diet Recommendation Nectar-thick liquid    Liquid Administration via: Cup;Straw Medication Administration:  (as tolerated) Supervision: Staff to assist with self feeding Compensations: Slow rate;Small sips/bites Postural Changes: Seated upright at 90 degrees     Other  Recommendations Oral Care Recommendations: Oral care BID    Recommendations for follow up therapy are one component of a multi-disciplinary discharge planning process, led by the attending physician.  Recommendations may be updated based on patient status, additional functional criteria and insurance authorization.  Follow up Recommendations  (TBD)      Assistance Recommended at Discharge  N/A  Functional Status Assessment  (TBD)  Frequency and Duration  (TBD)          Prognosis Prognosis for improved oropharyngeal function:  (TBD)      Swallow Study   General Date of Onset: 04/12/23 HPI: [name] is a 83 y.o. male who, per chart review, was noted by wife at home to have easy fatigability, and dyspnea on exertion, to the point where he did not wanted to get out of the bed any more. His po intake had significantly decreased over the last 48 hrs. CXR 4/3 suggestive of CHF or pulmonary edema.  Head CT 4/3 without acute findings.  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 Type of Study: Bedside Swallow Evaluation Previous Swallow Assessment: None Diet Prior to this Study: Clear liquid diet Temperature Spikes Noted: No History of Recent Intubation: No Behavior/Cognition: Alert;Confused;Doesn't follow directions Oral Cavity Assessment: Within Functional Limits Oral Care Completed by SLP: No Oral Cavity - Dentition: Edentulous Self-Feeding Abilities: Total assist Patient Positioning: Upright in bed Baseline Vocal Quality: Normal Volitional Cough: Cognitively unable to elicit Volitional Swallow: Unable to elicit    Oral/Motor/Sensory Function Overall Oral Motor/Sensory Function:  (Unable to assess)   Ice Chips Ice chips: Not tested   Thin Liquid Thin  Liquid: Impaired Presentation: Straw Pharyngeal  Phase Impairments: Cough - Immediate    Nectar Thick Nectar Thick Liquid: Within functional limits Presentation: Straw   Honey Thick Honey Thick Liquid: Not tested   Puree Puree: Within functional limits Presentation: Spoon   Solid     Solid: Impaired Oral Phase Impairments: Impaired mastication      Kerrie Pleasure, MA, CCC-SLP Acute Rehabilitation Services Office: [phone] 04/13/2023,11:01 AM

## 2023-04-13 NOTE — Hospital Course (Addendum)
 Mr. Bramer was admitted to the hospital with the working diagnosis of symptomatic anemia, complicated with heart failure decompensation   83 y.o. 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  Last couple of days patient has been not feeling well.  He was noted to have easy fatigability, and dyspnea on exertion, to the point where he did not wanted to get out of the bed any more. His po intake had significantly decreased over the last 48 hrs.   Not clear if he was having melena (in the past he had intermittent gastrointestinal bleeding). No significant bleeding from his left foot wound for the last 2 days, but it did bled when he got home.  On his initial physical examination his blood pressure was 140/80, HR 74, RR 16 and 02 saturation 93%.  Cardiovascular with S1 and S2 present and regular, positive systolic murmur at the base with no gallops or rubs Respiratory with bilateral rales on anterior auscultation, diffusely, with no wheezing or rhonchi Abdomen not distended or tender, no ecchymosis  No lower extremity edema  Left foot surgical wound with crust in place and signs of active bleeding, small amount of drainage. No erythema or local edema.   Na 133, K 5,1 Cl 98 bicarbonate 18 glucose 258, bun 55 cr 1,91  Anion gap 17  AST 686 ALT 577 BNP 1,901  Lactic acid 8.6 Wbc 14.8 hgb 4.1 (from 9,1 on 03/16/23), plt 395  Fecal occult test negative    CT head with no acute changes.    Chest radiograph with cardiomegaly, with bilateral hilar vascular congestion, bilateral central interstitial infiltrates, with bilateral pleural effusion  Left foot radiograph with sp proximal transmetatarsal amputation of the fifth toe. No acute osseous abnormality, no radiographic evidence of acute  osteomyelitis   EKG 74 bpm, normal axis, normal intervals, qtc 490, sinus rhythm with no significant ST segment or  T wave changes.   04/03 2 units PRBC transfusion.  Patient was placed on IV furosemide for diuresis.  04/04 hgb 7.6. for 2 more units PRBC transfusion, volume status has improved.   04/05 patient with agitation and pain, has developed atrial fibrillation with rapid ventricular response. Echocardiogram with reduced LV systolic function.  04/06 blood pressure more stable, resumed apixaban.  04/ 07 continue hemodynamically stable, mentation improving but not back to baseline.  04/08 continue very weak and deconditioned, may need SNF.

## 2023-04-13 NOTE — Progress Notes (Addendum)
 Progress Note   Patient: [name]:[id number] DOB: [birth year] DOA: 04/12/2023     1 DOS: the patient was seen and examined on 04/13/2023   Brief hospital course: [name] was admitted to the hospital with the working diagnosis of symptomatic anemia, complicated with heart failure decompensation   83 y.o. 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  Last couple of days patient has been not feeling well.  He was noted to have easy fatigability, and dyspnea on exertion, to the point where he did not wanted to get out of the bed any more. His po intake had significantly decreased over the last 48 hrs.   Not clear if he was having melena (in the past he had intermittent gastrointestinal bleeding). No significant bleeding from his left foot wound for the last 2 days, but it did bled when he got home.  On his initial physical examination his blood pressure was 140/80, HR 74, RR 16 and 02 saturation 93%.  Cardiovascular with S1 and S2 present and regular, positive systolic murmur at the base with no gallops or rubs Respiratory with bilateral rales on anterior auscultation, diffusely, with no wheezing or rhonchi Abdomen not distended or tender, no ecchymosis  No lower extremity edema  Left foot surgical wound with crust in place and signs of active bleeding, small amount of drainage. No erythema or local edema.   Na 133, K 5,1 Cl 98 bicarbonate 18 glucose 258, bun 55 cr 1,91  Anion gap 17  AST 686 ALT 577 BNP 1,901  Lactic acid 8.6 Wbc 14.8 hgb 4.1 (from 9,1 on 03/16/23), plt 395  Fecal occult test negative    CT head with no acute changes.    Chest radiograph with cardiomegaly, with bilateral hilar vascular congestion, bilateral central interstitial infiltrates, with  bilateral pleural effusion  EKG 74 bpm, normal axis, normal intervals, qtc 490, sinus rhythm with no significant ST segment or  T wave changes.   04/03 2 units PRBC transfusion.  Patient was placed on IV furosemide for diuresis.  04/04 hgb 7.6. for 2 more units PRBC transfusion, volume status has improved.    Assessment and Plan: * Symptomatic anemia No clinical signs of active bleeding.  Follow up hgb is 7,6   Considering heart disease will target a Hgb of 8 or greater.  Plan to continue pantoprazole 40 mg IV bid  Follow up H&H.   Acute on chronic diastolic CHF (congestive heart failure) (HCC) Volume status has improved, echocardiogram is pending.   Continue with furosemide 60 mg IV bid  Increase carvedilol due to increased ventricular response due to atrial fibrillation.   Elevated liver enzymes possible liver congestion, follow up LFT in am.   Acute metabolic encephalopathy Multifactorial encephalopathy, clinically improving.   Consult nutrition for recommendations.  Add thiamine and multivitamins Continue holding on duloxetine, trazodone and gabapentin for now.   Essential hypertension Continue blood pressure monitoring, hold on amlodipine.   Chronic kidney disease, stage 3a (HCC) AKI, Hyponatremia, Lactic acidosis.   Renal function with serum cr at 1,67 with K at 3,7 and serum bicarbonate at 22.  Na 137 anion gap 16   Continue diuresis with furosemide. Keep Hgb at 8 or greater.  Repeat lactic acid today.  Follow up renal function and electrolytes.      PAF (paroxysmal  atrial fibrillation) (HCC) Continue carvedilol for rate control.  Hold on anticoagulation in the setting of acute anemia.  Continue telemetry monitoring.   Type 2 diabetes mellitus with obesity (HCC) Hyperglycemia.  Continue insulin sliding scale for glucose cover and monitoring.  Hold on basal insulin for now due to poor oral intake and risk of hypoglycemia.  Hold on oral hypoglycemic  agents.   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  Will hold on antibiotic therapy for now with close follow up.  Low threshold to resume antibiotics.         Subjective: Patient continue very weak and deconditioned, this morning has mittens bilaterally. He is mildly confused but more awake than yesterday, responds to simple commands and answers to simple questions.  Dyspnea has improved, no chest pain and no foot pain   Physical Exam: Vitals:   04/13/23 0202 04/13/23 0401 04/13/23 0553 04/13/23 0900  BP: 120/72 132/77 130/66 114/78  Pulse: (!) 103 (!) 108 95 80  Resp: 17 15 20 15   Temp:  97.9 F (36.6 C) 98 F (36.7 C) (!) 97.5 F (36.4 C)  TempSrc:  Rectal Rectal Axillary  SpO2: 96% 100% 98% 100%  Weight:  100.5 kg    Height:       Neurology somnolent with improvement in his mentation. Follows simple commands and answer simple questions.  ENT with positive pallor with no icterus Cardiovascular with S1 and S2 present and tachycardic, irregularly irregular with no gallops, rubs or murmurs Mild JVD  Respiratory with mild rales at bases with no wheezing or rhonchi Abdomen with no distention  Lower extremity edema +  Left foot with dressing in place.  Data Reviewed:    Family Communication: no family at the bedside   Disposition: Status is: Inpatient Remains inpatient appropriate because: IV furosemide and PRBC transfusion   Planned Discharge Destination: to be determined       Author: Coralie Keens, MD 04/13/2023 10:21 AM  For on call review [email or web address].

## 2023-04-13 NOTE — Progress Notes (Signed)
   04/13/23 2236  Vitals  Temp 98.2 F (36.8 C)  Temp Source Oral  BP 93/62  MAP (mmHg) 72  BP Location Right Arm  BP Method Automatic  Patient Position (if appropriate) Lying  Pulse Rate (!) 128  Pulse Rate Source Monitor  ECG Heart Rate (!) 130  Resp 16  Level of Consciousness  Level of Consciousness Alert  MEWS COLOR  MEWS Score Color Red  Oxygen Therapy  SpO2 99 %  O2 Device Nasal Cannula  O2 Flow Rate (L/min) 2 L/min  Pain Assessment  Pain Scale Faces  Pain Score Asleep  MEWS Score  MEWS Temp 0  MEWS Systolic 1  MEWS Pulse 3  MEWS RR 0  MEWS LOC 0  MEWS Score 4   Will Notify M.D> on call and Rapid response RN Reden RN charge nurse aware

## 2023-04-13 NOTE — Progress Notes (Signed)
 Patient coughs after drinking water with and without straw.[name]

## 2023-04-14 DIAGNOSIS — D649 Anemia, unspecified: Secondary | ICD-10-CM | POA: Diagnosis not present

## 2023-04-14 DIAGNOSIS — I5023 Acute on chronic systolic (congestive) heart failure: Secondary | ICD-10-CM | POA: Diagnosis not present

## 2023-04-14 DIAGNOSIS — G9341 Metabolic encephalopathy: Secondary | ICD-10-CM | POA: Diagnosis not present

## 2023-04-14 DIAGNOSIS — I480 Paroxysmal atrial fibrillation: Secondary | ICD-10-CM | POA: Diagnosis not present

## 2023-04-14 LAB — BPAM RBC
Blood Product Expiration Date: 202504082359
Blood Product Expiration Date: 202504082359
Blood Product Expiration Date: 202504172359
Blood Product Expiration Date: 202504182359
ISSUE DATE / TIME: 202504031241
ISSUE DATE / TIME: 202504031615
ISSUE DATE / TIME: 202504041205
ISSUE DATE / TIME: 202504041740
Unit Type and Rh: 9500
Unit Type and Rh: 9500
Unit Type and Rh: 9500
Unit Type and Rh: 9500

## 2023-04-14 LAB — BASIC METABOLIC PANEL WITH GFR
Anion gap: 14 (ref 5–15)
BUN: 39 mg/dL — ABNORMAL HIGH (ref 8–23)
CO2: 29 mmol/L (ref 22–32)
Calcium: 8.4 mg/dL — ABNORMAL LOW (ref 8.9–10.3)
Chloride: 92 mmol/L — ABNORMAL LOW (ref 98–111)
Creatinine, Ser: 1.67 mg/dL — ABNORMAL HIGH (ref 0.61–1.24)
GFR, Estimated: 40 mL/min — ABNORMAL LOW (ref >60)
Glucose, Bld: 285 mg/dL — ABNORMAL HIGH (ref 70–99)
Potassium: 3.8 mmol/L (ref 3.5–5.1)
Sodium: 135 mmol/L (ref 135–145)

## 2023-04-14 LAB — COMPREHENSIVE METABOLIC PANEL WITH GFR
ALT: 327 U/L — ABNORMAL HIGH (ref 0–44)
AST: 175 U/L — ABNORMAL HIGH (ref 15–41)
Albumin: 3 g/dL — ABNORMAL LOW (ref 3.5–5.0)
Alkaline Phosphatase: 88 U/L (ref 38–126)
Anion gap: 15 (ref 5–15)
BUN: 40 mg/dL — ABNORMAL HIGH (ref 8–23)
CO2: 33 mmol/L — ABNORMAL HIGH (ref 22–32)
Calcium: 8.6 mg/dL — ABNORMAL LOW (ref 8.9–10.3)
Chloride: 93 mmol/L — ABNORMAL LOW (ref 98–111)
Creatinine, Ser: 1.72 mg/dL — ABNORMAL HIGH (ref 0.61–1.24)
GFR, Estimated: 39 mL/min — ABNORMAL LOW (ref >60)
Glucose, Bld: 225 mg/dL — ABNORMAL HIGH (ref 70–99)
Potassium: 2.6 mmol/L — CL (ref 3.5–5.1)
Sodium: 141 mmol/L (ref 135–145)
Total Bilirubin: 4.4 mg/dL — ABNORMAL HIGH (ref 0.0–1.2)
Total Protein: 6.9 g/dL (ref 6.5–8.1)

## 2023-04-14 LAB — GLUCOSE, CAPILLARY
Glucose-Capillary: 229 mg/dL — ABNORMAL HIGH (ref 70–99)
Glucose-Capillary: 394 mg/dL — ABNORMAL HIGH (ref 70–99)
Glucose-Capillary: 249 mg/dL — ABNORMAL HIGH (ref 70–99)
Glucose-Capillary: 228 mg/dL — ABNORMAL HIGH (ref 70–99)

## 2023-04-14 LAB — TYPE AND SCREEN
ABO/RH(D): O NEG
Antibody Screen: NEGATIVE
Unit division: 0
Unit division: 0
Unit division: 0
Unit division: 0

## 2023-04-14 LAB — HEMOGLOBIN AND HEMATOCRIT, BLOOD
HCT: 29.8 % — ABNORMAL LOW (ref 39.0–52.0)
Hemoglobin: 9.2 g/dL — ABNORMAL LOW (ref 13.0–17.0)

## 2023-04-14 LAB — MAGNESIUM: Magnesium: 1.8 mg/dL (ref 1.7–2.4)

## 2023-04-14 LAB — CBC
HCT: 26.8 % — ABNORMAL LOW (ref 39.0–52.0)
Hemoglobin: 8.4 g/dL — ABNORMAL LOW (ref 13.0–17.0)
MCH: 24.9 pg — ABNORMAL LOW (ref 26.0–34.0)
MCHC: 31.3 g/dL (ref 30.0–36.0)
MCV: 79.5 fL — ABNORMAL LOW (ref 80.0–100.0)
Platelets: 257 10*3/uL (ref 150–400)
RBC: 3.37 MIL/uL — ABNORMAL LOW (ref 4.22–5.81)
RDW: 16.2 % — ABNORMAL HIGH (ref 11.5–15.5)
WBC: 11.4 10*3/uL — ABNORMAL HIGH (ref 4.0–10.5)
nRBC: 1.8 % — ABNORMAL HIGH (ref 0.0–0.2)

## 2023-04-14 MED ORDER — OXYCODONE HCL 5 MG PO TABS
5.0000 mg | ORAL_TABLET | ORAL | Status: DC | PRN
  Administered 2023-04-14 – 2023-04-22 (×12): 5 mg via ORAL
  Filled 2023-04-14 (×12): qty 1

## 2023-04-14 MED ORDER — MAGNESIUM SULFATE 2 GM/50ML IV SOLN
2.0000 g | Freq: Once | INTRAVENOUS | Status: AC
  Administered 2023-04-14: 2 g via INTRAVENOUS
  Filled 2023-04-14: qty 50

## 2023-04-14 MED ORDER — ENSURE ENLIVE PO LIQD
237.0000 mL | Freq: Two times a day (BID) | ORAL | Status: DC
  Administered 2023-04-14 – 2023-04-20 (×11): 237 mL via ORAL

## 2023-04-14 MED ORDER — SODIUM CHLORIDE 0.9 % IV BOLUS
250.0000 mL | Freq: Once | INTRAVENOUS | Status: AC
  Administered 2023-04-14: 250 mL via INTRAVENOUS

## 2023-04-14 MED ORDER — POTASSIUM CHLORIDE 20 MEQ PO PACK
40.0000 meq | PACK | Freq: Once | ORAL | Status: AC
  Administered 2023-04-14: 40 meq via ORAL
  Filled 2023-04-14: qty 2

## 2023-04-14 MED ORDER — POTASSIUM CHLORIDE 10 MEQ/100ML IV SOLN
10.0000 meq | INTRAVENOUS | Status: AC
  Administered 2023-04-14 (×4): 10 meq via INTRAVENOUS
  Filled 2023-04-14 (×4): qty 100

## 2023-04-14 NOTE — Progress Notes (Signed)
 Dr. Antionette Char return page and aware of the above Vital signs and patient agitation earlier tonight. And patient has a large hematoma left upper shoulder and arm from blood  infusing via I.V. site.See new orders.

## 2023-04-14 NOTE — Progress Notes (Signed)
 Dr Antionette Char  made aware that patient remains in At. FIb RVR . And all vital signs . Will wait til morning labs are back  for further orders.

## 2023-04-14 NOTE — Progress Notes (Signed)
 Progress Note   Patient: [name] DOB: [birth year] DOA: 04/12/2023     2 DOS: the patient was seen and examined on 04/14/2023   Brief hospital course: [name] was admitted to the hospital with the working diagnosis of symptomatic anemia, complicated with heart failure decompensation   83 y.o. 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  Last couple of days patient has been not feeling well.  He was noted to have easy fatigability, and dyspnea on exertion, to the point where he did not wanted to get out of the bed any more. His po intake had significantly decreased over the last 48 hrs.   Not clear if he was having melena (in the past he had intermittent gastrointestinal bleeding). No significant bleeding from his left foot wound for the last 2 days, but it did bled when he got home.  On his initial physical examination his blood pressure was 140/80, HR 74, RR 16 and 02 saturation 93%.  Cardiovascular with S1 and S2 present and regular, positive systolic murmur at the base with no gallops or rubs Respiratory with bilateral rales on anterior auscultation, diffusely, with no wheezing or rhonchi Abdomen not distended or tender, no ecchymosis  No lower extremity edema  Left foot surgical wound with crust in place and signs of active bleeding, small amount of drainage. No erythema or local edema.   Na 133, K 5,1 Cl 98 bicarbonate 18 glucose 258, bun 55 cr 1,91  Anion gap 17  AST 686 ALT 577 BNP 1,901  Lactic acid 8.6 Wbc 14.8 hgb 4.1 (from 9,1 on 03/16/23), plt 395  Fecal occult test negative    CT head with no acute changes.    Chest radiograph with cardiomegaly, with bilateral hilar vascular congestion, bilateral central interstitial infiltrates, with  bilateral pleural effusion  EKG 74 bpm, normal axis, normal intervals, qtc 490, sinus rhythm with no significant ST segment or  T wave changes.   04/03 2 units PRBC transfusion.  Patient was placed on IV furosemide for diuresis.  04/04 hgb 7.6. for 2 more units PRBC transfusion, volume status has improved.   04/05 patient with agitation and pain, has developed atrial fibrillation with rapid ventricular response. Echocardiogram with reduced LV systolic function.   Assessment and Plan: * Symptomatic anemia No clinical signs of active bleeding.  Follow up hgb is 7,6   Considering heart disease will target a Hgb of 8 or greater.  Plan to continue pantoprazole 40 mg IV bid  Follow up H&H.   Acute on chronic diastolic CHF (congestive heart failure) (HCC) Volume status has improved, echocardiogram is pending.   Continue with furosemide 60 mg IV bid  Increase carvedilol due to increased ventricular response due to atrial fibrillation.   Elevated liver enzymes possible liver congestion, follow up LFT in am.   Acute metabolic encephalopathy Multifactorial encephalopathy, clinically improving.   Consult nutrition for recommendations.  Add thiamine and multivitamins Continue holding on duloxetine, trazodone and gabapentin for now.   Essential hypertension Continue blood pressure monitoring, hold on amlodipine.   Chronic kidney disease, stage 3a (HCC) AKI, Hyponatremia, Lactic acidosis.   Renal function with serum cr at 1,67 with K at 3,7 and serum bicarbonate at 22.  Na 137 anion gap 16   Continue diuresis with furosemide. Keep Hgb at 8  or greater.  Repeat lactic acid today.  Follow up renal function and electrolytes.      PAF (paroxysmal atrial fibrillation) (HCC) Continue carvedilol for rate control.  Hold on anticoagulation in the setting of acute anemia.  Continue telemetry monitoring.   Type 2 diabetes mellitus with obesity (HCC) Hyperglycemia.  Continue insulin  sliding scale for glucose cover and monitoring.  Hold on basal insulin for now due to poor oral intake and risk of hypoglycemia.  Hold on oral hypoglycemic agents.   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  Will hold on antibiotic therapy for now with close follow up.  Low threshold to resume antibiotics.         Subjective: Patient has been agitated, improved with analgesics, he has been thirsty. At the time of my examination continue confused, he is hard of hearing   Physical Exam: Vitals:   04/14/23 0358 04/14/23 0502 04/14/23 0739 04/14/23 0857  BP: (!) 90/49 105/70 95/65 115/81  Pulse: 77 (!) 134 (!) 115   Resp: 14 16 12 20   Temp: 97.8 F (36.6 C)  98.3 F (36.8 C)   TempSrc: Oral  Oral   SpO2: 98% 100% 96%   Weight:      Height:       Neurology awake, confused and disorientated, not agitated at the time of my examination  ENT with mild pallor, oral mucosa dry  Cardiovascular with S1 and S2 present and regular with positive systolic murmur at the basel with no gallops, or rubs No JVD No lower extremity edema Respiratory with no rales or wheezing, on anterior auscultation  Abdomen with no distention  No lower extremity edema.  Data Reviewed:    Family Communication: no family at the bedside   Disposition: Status is: Inpatient Remains inpatient appropriate because: critically ill,   Planned Discharge Destination:  to be determined     Author: Coralie Keens, MD 04/14/2023 9:49 AM  For on call review [email or web address].

## 2023-04-14 NOTE — IPAL (Signed)
 I spoke with Mr. Dennis wife at the bedside. I explained her, [name] critical condition, including anemia, vital signs and mentation. He does have advance directives in place. His wife has talked to his son and decision was made to change code status to DNR.

## 2023-04-14 NOTE — Progress Notes (Signed)
 Patient very restless, yelling out, agitated.  Seems to be uncomfort. I.V. with blood has infiltrate has large bruise and hematoma left upper arm. D.C. I.V. Reden R.N. aware and into try and get new I.V. site x 2 attempts. Unable to get new site. I.V. callled to get new site. . Cont. To monitor left infiltrate . Emotional support given to patient and water and Dilaudid 1 mg I.V. given for dicomfort. Patient is resting comfort. Aften Dilaudid. Given. Mindy R.N.and Dr. Antionette Char has been inform of IV infiltration

## 2023-04-14 NOTE — Progress Notes (Signed)
   04/14/23 0358  Assess: MEWS Score  Temp 97.8 F (36.6 C)  BP (!) 90/49  MAP (mmHg) (!) 63  Pulse Rate 77  ECG Heart Rate (!) 111  Resp 14  Level of Consciousness Alert  SpO2 98 %  O2 Device Nasal Cannula  O2 Flow Rate (L/min) 2 L/min  Assess: MEWS Score  MEWS Temp 0  MEWS Systolic 1  MEWS Pulse 2  MEWS RR 0  MEWS LOC 0  MEWS Score 3  MEWS Score Color Yellow  Assess: if the MEWS score is Yellow or Red  Were vital signs accurate and taken at a resting state? Yes  Does the patient meet 2 or more of the SIRS criteria? Yes  Does the patient have a confirmed or suspected source of infection? Yes  MEWS guidelines implemented  No, previously yellow, continue vital signs every 4 hours  Assess: SIRS CRITERIA  SIRS Temperature  0  SIRS Respirations  0  SIRS Pulse 1  SIRS WBC 0  SIRS Score Sum  1   Vital signs after Lopressor given Rn aware.

## 2023-04-14 NOTE — Progress Notes (Signed)
   04/14/23 0223  Vitals  BP 109/68  MAP (mmHg) 80  BP Location Right Arm  BP Method Automatic  Patient Position (if appropriate) Lying  Pulse Rate (!) 132  Pulse Rate Source Monitor  ECG Heart Rate (!) 124  Resp 20  Level of Consciousness  Level of Consciousness Alert  MEWS COLOR  MEWS Score Color Yellow  Oxygen Therapy  SpO2 95 %  O2 Device Nasal Cannula  O2 Flow Rate (L/min) 2 L/min  MEWS Score  MEWS Temp 0  MEWS Systolic 0  MEWS Pulse 2  MEWS RR 0  MEWS LOC 0  MEWS Score 2   Heart rate remains in 130's  at rest ,Discuss with Reden R.N. will give Lopressor 2.5 mg I.V. since B.P. is back up.

## 2023-04-14 NOTE — Progress Notes (Signed)
 Dr Ella Jubilee consulted for BP 94/39 and MAP 55. Patient is lethargic but calm.  Will awake to touch and voice.

## 2023-04-14 NOTE — Plan of Care (Signed)
   Problem: Coping: Goal: Level of anxiety will decrease Outcome: Progressing   Problem: Elimination: Goal: Will not experience complications related to bowel motility Outcome: Progressing Goal: Will not experience complications related to urinary retention Outcome: Progressing

## 2023-04-15 DIAGNOSIS — G9341 Metabolic encephalopathy: Secondary | ICD-10-CM | POA: Diagnosis not present

## 2023-04-15 DIAGNOSIS — Z515 Encounter for palliative care: Secondary | ICD-10-CM

## 2023-04-15 DIAGNOSIS — D649 Anemia, unspecified: Secondary | ICD-10-CM | POA: Diagnosis not present

## 2023-04-15 DIAGNOSIS — Z7189 Other specified counseling: Secondary | ICD-10-CM

## 2023-04-15 DIAGNOSIS — I480 Paroxysmal atrial fibrillation: Secondary | ICD-10-CM | POA: Diagnosis not present

## 2023-04-15 DIAGNOSIS — I5023 Acute on chronic systolic (congestive) heart failure: Secondary | ICD-10-CM | POA: Diagnosis not present

## 2023-04-15 LAB — CBC
HCT: 29.2 % — ABNORMAL LOW (ref 39.0–52.0)
Hemoglobin: 8.9 g/dL — ABNORMAL LOW (ref 13.0–17.0)
MCH: 25.1 pg — ABNORMAL LOW (ref 26.0–34.0)
MCHC: 30.5 g/dL (ref 30.0–36.0)
MCV: 82.3 fL (ref 80.0–100.0)
Platelets: 223 10*3/uL (ref 150–400)
RBC: 3.55 MIL/uL — ABNORMAL LOW (ref 4.22–5.81)
RDW: 16.9 % — ABNORMAL HIGH (ref 11.5–15.5)
WBC: 11.4 10*3/uL — ABNORMAL HIGH (ref 4.0–10.5)
nRBC: 1.5 % — ABNORMAL HIGH (ref 0.0–0.2)

## 2023-04-15 LAB — BASIC METABOLIC PANEL WITH GFR
Anion gap: 13 (ref 5–15)
BUN: 31 mg/dL — ABNORMAL HIGH (ref 8–23)
CO2: 29 mmol/L (ref 22–32)
Calcium: 8.4 mg/dL — ABNORMAL LOW (ref 8.9–10.3)
Chloride: 94 mmol/L — ABNORMAL LOW (ref 98–111)
Creatinine, Ser: 1.3 mg/dL — ABNORMAL HIGH (ref 0.61–1.24)
GFR, Estimated: 54 mL/min — ABNORMAL LOW (ref >60)
Glucose, Bld: 225 mg/dL — ABNORMAL HIGH (ref 70–99)
Potassium: 3.2 mmol/L — ABNORMAL LOW (ref 3.5–5.1)
Sodium: 136 mmol/L (ref 135–145)

## 2023-04-15 LAB — GLUCOSE, CAPILLARY
Glucose-Capillary: 218 mg/dL — ABNORMAL HIGH (ref 70–99)
Glucose-Capillary: 320 mg/dL — ABNORMAL HIGH (ref 70–99)
Glucose-Capillary: 321 mg/dL — ABNORMAL HIGH (ref 70–99)
Glucose-Capillary: 314 mg/dL — ABNORMAL HIGH (ref 70–99)

## 2023-04-15 MED ORDER — CARVEDILOL 25 MG PO TABS: 25.0000 mg | ORAL_TABLET | Freq: Two times a day (BID) | ORAL | Status: DC

## 2023-04-15 MED ORDER — INSULIN GLARGINE-YFGN 100 UNIT/ML ~~LOC~~ SOLN
10.0000 [IU] | Freq: Every day | SUBCUTANEOUS | Status: DC
  Administered 2023-04-15 – 2023-04-18 (×4): 10 [IU] via SUBCUTANEOUS
  Filled 2023-04-15 (×5): qty 0.1

## 2023-04-15 MED ORDER — POTASSIUM CHLORIDE 20 MEQ PO PACK
40.0000 meq | PACK | ORAL | Status: AC
  Administered 2023-04-15 (×2): 40 meq via ORAL
  Filled 2023-04-15 (×2): qty 2

## 2023-04-15 MED ORDER — APIXABAN 5 MG PO TABS
5.0000 mg | ORAL_TABLET | Freq: Two times a day (BID) | ORAL | Status: DC
  Administered 2023-04-15 – 2023-04-19 (×9): 5 mg via ORAL
  Filled 2023-04-15 (×9): qty 1

## 2023-04-15 MED ORDER — CARVEDILOL 12.5 MG PO TABS
12.5000 mg | ORAL_TABLET | Freq: Two times a day (BID) | ORAL | Status: DC
  Administered 2023-04-15: 12.5 mg via ORAL
  Filled 2023-04-15: qty 1

## 2023-04-15 MED ORDER — PANTOPRAZOLE SODIUM 40 MG PO TBEC
40.0000 mg | DELAYED_RELEASE_TABLET | Freq: Every day | ORAL | Status: DC
  Administered 2023-04-17 – 2023-04-20 (×4): 40 mg via ORAL
  Filled 2023-04-15 (×4): qty 1

## 2023-04-15 MED ORDER — CARVEDILOL 12.5 MG PO TABS: 12.5000 mg | ORAL_TABLET | Freq: Once | ORAL | Status: DC

## 2023-04-15 NOTE — Plan of Care (Signed)
  Problem: Clinical Measurements: Goal: Ability to maintain clinical measurements within normal limits will improve Outcome: Progressing Goal: Will remain free from infection Outcome: Progressing Goal: Diagnostic test results will improve Outcome: Progressing Goal: Respiratory complications will improve Outcome: Progressing Goal: Cardiovascular complication will be avoided Outcome: Progressing   Problem: Coping: Goal: Level of anxiety will decrease Outcome: Progressing   Problem: Elimination: Goal: Will not experience complications related to bowel motility Outcome: Progressing Goal: Will not experience complications related to urinary retention Outcome: Progressing   Problem: Pain Managment: Goal: General experience of comfort will improve and/or be controlled Outcome: Progressing   Problem: Safety: Goal: Ability to remain free from injury will improve Outcome: Progressing   Problem: Skin Integrity: Goal: Risk for impaired skin integrity will decrease Outcome: Progressing   Problem: Education: Goal: Knowledge of General Education information will improve Description: Including pain rating scale, medication(s)/side effects and non-pharmacologic comfort measures Outcome: Not Progressing   Problem: Health Behavior/Discharge Planning: Goal: Ability to manage health-related needs will improve Outcome: Not Progressing   Problem: Activity: Goal: Risk for activity intolerance will decrease Outcome: Not Progressing   Problem: Nutrition: Goal: Adequate nutrition will be maintained Outcome: Not Progressing

## 2023-04-15 NOTE — Consult Note (Signed)
 Palliative Medicine Inpatient Consult Note  Consulting Provider:  Coralie Keens, MD    Reason for consult:   Palliative Care Consult Services Palliative Medicine Consult  Reason for Consult? Patient and family desires DNR status   04/15/2023  HPI:  Per intake H&P --> 83 y.o. 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 Has had worsening dyspnea related to eart failure decompensation.  Palliative case has been asked to get involved to support additional goals of care conversations.   Clinical Assessment/Goals of Care:  *Please note that this is a verbal dictation therefore any spelling or grammatical errors are due to the "Dragon Medical One" system interpretation.  I have reviewed medical records including EPIC notes, labs and imaging, received report from bedside RN, assessed the patient who is lying in bed disoriented.    I met with patient's wife, [name], 2 sons, and grandson to further discuss diagnosis prognosis, GOC, EOL wishes, disposition and options.   I introduced Palliative Medicine as specialized medical care for people living with serious illness. It focuses on providing relief from the symptoms and stress of a serious illness. The goal is to improve quality of life for both the patient and the family.  Medical History Review and Understanding:  A review of patient's type 2 diabetes, coronary artery disease, hypertension, atrial fibrillation, neuropathy, osteomyelitis, and venous insufficiency was completed.  Social History:  [name] is from [place], [place].  He and his wife have been married for 61 years.  He has 2 sons and 1 grandson.  He used to work at [employer] as a Marine scientist.  He is a man of faith practicing within  Christianity.  Functional and Nutritional State:  Preceding hospitalization [name] has been wheelchair-bound for the past 3 years.  His family is able to get him up and transition him to the wheelchair.  He is normally able to assist in feeding himself despite his digit amputations.  He does have a good appetite in the home environment.  Advance Directives:  A detailed discussion was had today regarding advanced directives.  [name] does have advanced directives and his wife has brought in a copy of these to scan into the electronic medical record.  Code Status:  Concepts specific to code status, artifical feeding and hydration, continued IV antibiotics and rehospitalization was had.  The difference between a aggressive medical intervention path  and a palliative comfort care path for this patient at this time was had.   [name] is an established DO NOT RESUSCITATE DO NOT INTUBATE CODE STATUS.  Discussion:  Patient's family notes that he was doing relatively well until Wednesday of this week when he got more fatigued and weak and requiring the patient's family to prompt EMS to take him to the hospital setting.  Apparently in the home environment he had a "pop" in his right rib causing him ongoing pain on the right side of his chest.  A review of [name] current health condition(s) were had.  Discussions related to heart failure and the progressive nature of the disease in accordance with the New York Heart Association classification system were held.  Discussions regarding medical management were completed.  We reviewed situations whereby heart failure gets to a point in terms of his chronicity where patients continue to be readmitted to the hospital.  I shared  when we begin to see that cycle associated with the chronic disease trajectory it is then that we talk about things like hospice care and support.  Patient's family at this time are hopeful that we can get through this acute illness and  transition him home.  They are most hopeful he will get to his previous baseline from a cognitive and physical perspective.  Discussed the importance of continued conversation with family and their  medical providers regarding overall plan of care and treatment options, ensuring decisions are within the context of the patients values and GOCs.  Decision Maker: [name], [name] (Spouse): [phone] (Mobile)   SUMMARY OF RECOMMENDATIONS   DNAR/DNI  Have obtained a copy of [facility] to scanned into the electronic medical record  Discussions and education provided regarding heart failure  Goals are for patient to get home once medically optimized and return to his previous baseline level of function  Institute strict delirium precautions  Plan for ongoing palliative care support  Code Status/Advance Care Planning: DNAR/DNI  Palliative Prophylaxis:  Aspiration, Bowel Regimen, Delirium Protocol, Frequent Pain Assessment, Oral Care, Palliative Wound Care, and Turn Reposition  Additional Recommendations (Limitations, Scope, Preferences): Continue current care  Psycho-social/Spiritual:  Desire for further Chaplaincy support: Yes-Christian Additional Recommendations: Education on chronic disease-trajectory   Prognosis: Has poor baseline functional status and high chronic disease burden placing him in an increased 49-month mortality risk.  Discharge Planning: Discharge once medically optimized.  Vitals:   04/15/23 0736 04/15/23 1056  BP: 100/76 106/60  Pulse:  (!) 105  Resp: 15 (!) 21  Temp: 98.2 F (36.8 C) 97.8 F (36.6 C)  SpO2: 94% 95%    Intake/Output Summary (Last 24 hours) at 04/15/2023 1104 Last data filed at 04/15/2023 0753 Gross per 24 hour  Intake 472 ml  Output 750 ml  Net -278 ml   Last Weight  Most recent update: 04/15/2023  4:36 AM    Weight  98.9 kg (218 lb 0.6 oz)            Gen: Elderly Caucasian male chronically ill in appearance HEENT: moist mucous  membranes CV: Irregular rate and rhythm  PULM: On room air breathing is even and nonlabored ABD: soft/nontender  EXT: (+) L toe wound Neuro: Disoriented  PPS: 30%   This conversation/these recommendations were discussed with patient primary care team, Dr. Ella Jubilee  Billing based on MDM: High  Problems Addressed: One acute or chronic illness or injury that poses a threat to life or bodily function  Amount and/or Complexity of Data: Category 2:Independent interpretation of a test performed by another physician/other qualified health care professional (not separately reported) and Category 3:Discussion of management or test interpretation with external physician/other qualified health care professional/appropriate source (not separately reported)  Risks: Decision regarding hospitalization or escalation of hospital care and Decision not to resuscitate or to de-escalate care because of poor prognosis ______________________________________________________ [name] [facility] Palliative Medicine Team Team Cell Phone: [phone] Please utilize secure chat with additional questions, if there is no response within 30 minutes please call the above phone number  Palliative Medicine Team providers are available by phone from 7am to 7pm daily and can be reached through the team cell phone.  Should this patient require assistance outside of these hours, please call the patient's attending physician.

## 2023-04-15 NOTE — Progress Notes (Signed)
 Progress Note   Patient: [name] WGN:[id number] DOB: 1940/11/21 DOA: 04/12/2023     3 DOS: the patient was seen and examined on 04/15/2023   Brief hospital course: [name] was admitted to the hospital with the working diagnosis of symptomatic anemia, complicated with heart failure decompensation   83 y.o. 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  Last couple of days patient has been not feeling well.  He was noted to have easy fatigability, and dyspnea on exertion, to the point where he did not wanted to get out of the bed any more. His po intake had significantly decreased over the last 48 hrs.   Not clear if he was having melena (in the past he had intermittent gastrointestinal bleeding). No significant bleeding from his left foot wound for the last 2 days, but it did bled when he got home.  On his initial physical examination his blood pressure was 140/80, HR 74, RR 16 and 02 saturation 93%.  Cardiovascular with S1 and S2 present and regular, positive systolic murmur at the base with no gallops or rubs Respiratory with bilateral rales on anterior auscultation, diffusely, with no wheezing or rhonchi Abdomen not distended or tender, no ecchymosis  No lower extremity edema  Left foot surgical wound with crust in place and signs of active bleeding, small amount of drainage. No erythema or local edema.   Na 133, K 5,1 Cl 98 bicarbonate 18 glucose 258, bun 55 cr 1,91  Anion gap 17  AST 686 ALT 577 BNP 1,901  Lactic acid 8.6 Wbc 14.8 hgb 4.1 (from 9,1 on 03/16/23), plt 395  Fecal occult test negative    CT head with no acute changes.    Chest radiograph with cardiomegaly, with bilateral hilar vascular congestion, bilateral central interstitial infiltrates, with  bilateral pleural effusion  EKG 74 bpm, normal axis, normal intervals, qtc 490, sinus rhythm with no significant ST segment or  T wave changes.   04/03 2 units PRBC transfusion.  Patient was placed on IV furosemide for diuresis.  04/04 hgb 7.6. for 2 more units PRBC transfusion, volume status has improved.   04/05 patient with agitation and pain, has developed atrial fibrillation with rapid ventricular response. Echocardiogram with reduced LV systolic function.  04/06 blood pressure more stable, resumed apixaban.   Assessment and Plan: * Symptomatic anemia No clinical signs of active bleeding.  Follow up hgb is 8.9   Has been on IV pantoprazole for 72 hrs, will transition to oral pantoprazole 40 mg daily  No signs of active bleeding, no melena  Check iron stores tomorrow   Acute on chronic systolic CHF (congestive heart failure) (HCC) Echocardiogram with reduced LV systolic function 30 to 35%, global hypokinesis, RV systolic function preserved, RVSP 35.2 mmHg, LA and RA with mild dilatation, severe mitral valve regurgitation, moderate mitral valve stenosis, moderate aortic valve stenosis, mild to moderate tricuspid valve regurgitation, no pericardial effusion   Urine output is 750 cc, since admission is - 6,044ml.  Systolic blood pressure 100 mmHg range  Continue carvedilol for atrial fibrillation rate control. (Caution due to low EF)   Continue to hold on diuresis due to risk of hypotension  Hold on RAAS inhibition due to risk of hypotension   Elevated liver enzymes possible liver congestion, liver enzymes are trending down with ALT  at 175 and AST at 327   Acute metabolic encephalopathy Multifactorial encephalopathy, intermittent agitation, consistent with delirium.   Tolerating well dysphagia 3 with aspiration precautions.  Pain control with oxycodone and hydromorphone.  Haldol as needed for agitation. Continue high dose thiamine and multivitamins.  Neuro checks per unit  protocol.   PAF (paroxysmal atrial fibrillation) (HCC) Patient with atrial fibrillation and atrial flutter with rapid ventricular response.   Plan to continue for now with carvedilol, with caution due to low EF  If worsening may need amiodarone No signs of bleeding, will start patient on apixaban for anticoagulation.   Keep K at 4 and Mg at 2.  Treat pain and agitation.   Chronic kidney disease, stage 3a (HCC) AKI, Hyponatremia, Lactic acidosis. Hypokalemia.   Improved renal function with serum cr to 1,3 with K at 3,2 and serum bicarbonate at 29  Na 136   Continue K correction with KCl, will give 80 meq in 2 divided doses.  Follow up renal function and electrolytes in am.  Avoid hypotension and nephrotoxic medications.    Essential hypertension Continue blood pressure monitoring discontinue amlodipine.   Type 2 diabetes mellitus with obesity (HCC) Hyperglycemia.   Fasting glucose today 225, capillary glucose 249, 228 and 218  Continue insulin sliding scale for glucose cover and monitoring.  Patient had 17 units yesterday of rapid acting insulin per sliding scale.  His po intake has improved Will add basal insulin 10 units and continue close monitoring.   Hold on oral hypoglycemic agents.   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  Continue to hold on antibiotic therapy for now with close follow up.  Low threshold to resume antibiotics.  Will consult orthopedics in am to follow up on wound.       Subjective: Patient is more awake and alert, continue to be slow to response. He has intermittent pain (unspecified) , denies dyspnea or palpitations   Physical Exam: Vitals:   04/14/23 1951 04/14/23 2300 04/15/23 0300 04/15/23 0736  BP: 103/72 113/73 (!) 151/134 100/76  Pulse: (!) 108     Resp: 15 13 14 15    Temp:  98.3 F (36.8 C) 98.1 F (36.7 C) 98.2 F (36.8 C)  TempSrc:  Oral Oral   SpO2: 92%   94%  Weight:   98.9 kg   Height:       Neurology awake and alert, responds to simple questions and follows simple commands.  ENT with mild pallor with no icterus Cardiovascular with S1 and S2 present, irregularly irregular with no gallops or rubs, no murmurs No JVD No lower extremity edema Respiratory with no rales or wheezing, no rhonchi  Abdomen with no distention  No lower extremity edema, left foot with dressing in place  Data Reviewed:    Family Communication: no family at the bedside   Disposition: Status is: Inpatient Remains inpatient appropriate because: critically ill   Planned Discharge Destination: Skilled nursing facility     Author: Coralie Keens, MD 04/15/2023 9:47 AM  For on call review [email or web address].

## 2023-04-16 ENCOUNTER — Telehealth: Payer: Self-pay

## 2023-04-16 ENCOUNTER — Inpatient Hospital Stay (HOSPITAL_COMMUNITY)

## 2023-04-16 DIAGNOSIS — I5023 Acute on chronic systolic (congestive) heart failure: Secondary | ICD-10-CM | POA: Diagnosis not present

## 2023-04-16 DIAGNOSIS — D649 Anemia, unspecified: Secondary | ICD-10-CM | POA: Diagnosis not present

## 2023-04-16 DIAGNOSIS — G9341 Metabolic encephalopathy: Secondary | ICD-10-CM | POA: Diagnosis not present

## 2023-04-16 DIAGNOSIS — I480 Paroxysmal atrial fibrillation: Secondary | ICD-10-CM | POA: Diagnosis not present

## 2023-04-16 DIAGNOSIS — Z515 Encounter for palliative care: Secondary | ICD-10-CM | POA: Diagnosis not present

## 2023-04-16 DIAGNOSIS — Z7189 Other specified counseling: Secondary | ICD-10-CM | POA: Diagnosis not present

## 2023-04-16 LAB — IRON AND TIBC
Iron: 28 ug/dL — ABNORMAL LOW (ref 45–182)
Saturation Ratios: 8 % — ABNORMAL LOW (ref 17.9–39.5)
TIBC: 347 ug/dL (ref 250–450)
UIBC: 319 ug/dL

## 2023-04-16 LAB — CBC
HCT: 30.6 % — ABNORMAL LOW (ref 39.0–52.0)
Hemoglobin: 9.1 g/dL — ABNORMAL LOW (ref 13.0–17.0)
MCH: 25.1 pg — ABNORMAL LOW (ref 26.0–34.0)
MCHC: 29.7 g/dL — ABNORMAL LOW (ref 30.0–36.0)
MCV: 84.5 fL (ref 80.0–100.0)
Platelets: 225 10*3/uL (ref 150–400)
RBC: 3.62 MIL/uL — ABNORMAL LOW (ref 4.22–5.81)
RDW: 17.6 % — ABNORMAL HIGH (ref 11.5–15.5)
WBC: 13.3 10*3/uL — ABNORMAL HIGH (ref 4.0–10.5)
nRBC: 0.8 % — ABNORMAL HIGH (ref 0.0–0.2)

## 2023-04-16 LAB — BASIC METABOLIC PANEL WITH GFR
Anion gap: 14 (ref 5–15)
BUN: 25 mg/dL — ABNORMAL HIGH (ref 8–23)
CO2: 27 mmol/L (ref 22–32)
Calcium: 8.7 mg/dL — ABNORMAL LOW (ref 8.9–10.3)
Chloride: 95 mmol/L — ABNORMAL LOW (ref 98–111)
Creatinine, Ser: 1.11 mg/dL (ref 0.61–1.24)
GFR, Estimated: >60 mL/min (ref >60)
Glucose, Bld: 223 mg/dL — ABNORMAL HIGH (ref 70–99)
Potassium: 3.8 mmol/L (ref 3.5–5.1)
Sodium: 136 mmol/L (ref 135–145)

## 2023-04-16 LAB — GLUCOSE, CAPILLARY
Glucose-Capillary: 221 mg/dL — ABNORMAL HIGH (ref 70–99)
Glucose-Capillary: 218 mg/dL — ABNORMAL HIGH (ref 70–99)
Glucose-Capillary: 212 mg/dL — ABNORMAL HIGH (ref 70–99)
Glucose-Capillary: 210 mg/dL — ABNORMAL HIGH (ref 70–99)
Glucose-Capillary: 196 mg/dL — ABNORMAL HIGH (ref 70–99)

## 2023-04-16 LAB — MAGNESIUM: Magnesium: 2.2 mg/dL (ref 1.7–2.4)

## 2023-04-16 LAB — HEMOGLOBIN A1C
Hgb A1c MFr Bld: 5.6 % (ref 4.8–5.6)
Mean Plasma Glucose: 114.02 mg/dL

## 2023-04-16 LAB — FERRITIN: Ferritin: 30 ng/mL (ref 24–336)

## 2023-04-16 LAB — TRANSFERRIN: Transferrin: 242 mg/dL (ref 180–329)

## 2023-04-16 MED ORDER — POTASSIUM CHLORIDE 10 MEQ/100ML IV SOLN
10.0000 meq | INTRAVENOUS | Status: AC
  Filled 2023-04-16 (×2): qty 100

## 2023-04-16 MED ORDER — VANCOMYCIN HCL 2000 MG/400ML IV SOLN
2000.0000 mg | Freq: Once | INTRAVENOUS | Status: AC
  Administered 2023-04-16: 2000 mg via INTRAVENOUS
  Filled 2023-04-16: qty 400

## 2023-04-16 MED ORDER — VANCOMYCIN HCL 1750 MG/350ML IV SOLN
1750.0000 mg | INTRAVENOUS | Status: DC
  Administered 2023-04-18: 1750 mg via INTRAVENOUS
  Filled 2023-04-16: qty 350

## 2023-04-16 MED ORDER — INSULIN ASPART 100 UNIT/ML IJ SOLN
0.0000 [IU] | Freq: Three times a day (TID) | INTRAMUSCULAR | Status: DC
  Administered 2023-04-16: 5 [IU] via SUBCUTANEOUS
  Administered 2023-04-17 (×2): 8 [IU] via SUBCUTANEOUS
  Administered 2023-04-17 – 2023-04-18 (×2): 5 [IU] via SUBCUTANEOUS
  Administered 2023-04-18: 8 [IU] via SUBCUTANEOUS
  Administered 2023-04-18: 3 [IU] via SUBCUTANEOUS
  Administered 2023-04-19: 5 [IU] via SUBCUTANEOUS
  Administered 2023-04-19: 3 [IU] via SUBCUTANEOUS
  Administered 2023-04-19: 11 [IU] via SUBCUTANEOUS
  Administered 2023-04-20: 3 [IU] via SUBCUTANEOUS

## 2023-04-16 MED ORDER — VANCOMYCIN HCL 2000 MG/400ML IV SOLN
2000.0000 mg | Freq: Once | INTRAVENOUS | Status: DC
  Filled 2023-04-16 (×2): qty 400

## 2023-04-16 MED ORDER — POTASSIUM CHLORIDE CRYS ER 20 MEQ PO TBCR: 40.0000 meq | EXTENDED_RELEASE_TABLET | Freq: Once | ORAL | Status: DC

## 2023-04-16 MED ORDER — CARVEDILOL 12.5 MG PO TABS
12.5000 mg | ORAL_TABLET | Freq: Two times a day (BID) | ORAL | Status: DC
  Administered 2023-04-16 – 2023-04-18 (×6): 12.5 mg via ORAL
  Filled 2023-04-16 (×6): qty 1

## 2023-04-16 MED ORDER — SODIUM CHLORIDE 0.9 % IV SOLN
2.0000 g | Freq: Two times a day (BID) | INTRAVENOUS | Status: DC
  Administered 2023-04-16 – 2023-04-18 (×4): 2 g via INTRAVENOUS
  Filled 2023-04-16 (×4): qty 12.5

## 2023-04-16 NOTE — Plan of Care (Signed)
  Problem: Clinical Measurements: Goal: Ability to maintain clinical measurements within normal limits will improve Outcome: Progressing Goal: Will remain free from infection Outcome: Progressing Goal: Diagnostic test results will improve Outcome: Progressing Goal: Respiratory complications will improve Outcome: Progressing Goal: Cardiovascular complication will be avoided Outcome: Progressing   Problem: Coping: Goal: Level of anxiety will decrease Problem: Education: Goal: Knowledge of General Education information will improve Description: Including pain rating scale, medication(s)/side effects and non-pharmacologic comfort measures Outcome: Not Progressing   Problem: Health Behavior/Discharge Planning: Goal: Ability to manage health-related needs will improve Outcome: Not Progressing   Problem: Activity: Goal: Risk for activity intolerance will decrease Outcome: Not Progressing   Problem: Nutrition: Goal: Adequate nutrition will be maintained Outcome: Not Progressing   Outcome: Progressing   Problem: Elimination: Goal: Will not experience complications related to bowel motility Outcome: Progressing Goal: Will not experience complications related to urinary retention Outcome: Progressing   Problem: Pain Managment: Goal: General experience of comfort will improve and/or be controlled Outcome: Progressing   Problem: Safety: Goal: Ability to remain free from injury will improve Outcome: Progressing   Problem: Skin Integrity: Goal: Risk for impaired skin integrity will decrease Outcome: Progressing

## 2023-04-16 NOTE — Evaluation (Signed)
 Modified Barium Swallow Study  Patient Details  Name: [name] MRN: [id number] Date of Birth: 09-01-1940  Today's Date: 04/16/2023  Modified Barium Swallow completed.  Full report located under Chart Review in the Imaging Section.  History of Present Illness [name] is a 83 y.o. male who, per chart review, was noted by wife at home to have easy fatigability, and dyspnea on exertion, to the point where he did not wanted to get out of the bed any more. His po intake had significantly decreased over the last 48 hrs. CXR 4/3 suggestive of CHF or pulmonary edema.  Head CT 4/3 without acute findings.  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   Clinical Impression Pt has a mild oral dysphagia with relatively functional pharyngeal phase. He has reduced lingual hold and bolus cohesion, with repetitive lingual motion espcially with purees (with liquids, it is less repetitive but sometimes slow or delayed). Mastication is prolonged but effective. There is a collection of oral residue that he will often self-manage with a spontaneous second swallow, although especially with thin liquids this can spill into the valleculae first. His swallow initiation occurs promptly though with good timing, pharyngeal efficiency, and airway protection. No aspiration or penetration is observed (cough x1 during MBS not associated with aspiration). The barium tablet cleared his oral cavity and pharynx well but did appear to get stopped in his esophagus, moving a little further down with dry swallows. Recommend that pt continue with mechanical soft diet but will adjust liquids to be thin.  Factors that may increase risk of adverse event in presence of aspiration Rubye Oaks [employer] Clearance Coots 2021): Poor general  health and/or compromised immunity;Reduced cognitive function  Swallow Evaluation Recommendations Recommendations: PO diet PO Diet Recommendation: Dysphagia 3 (Mechanical soft);Thin liquids (Level 0) Liquid Administration via: Cup;Straw Medication Administration: Whole meds with liquid (may want to crush larger pills) Supervision: Staff to assist with self-feeding Swallowing strategies  : Slow rate;Small bites/sips;Follow solids with liquids Postural changes: Position pt fully upright for meals;Stay upright 30-60 min after meals Oral care recommendations: Oral care BID (2x/day)      Mahala Menghini., M.A. CCC-SLP Acute Rehabilitation Services Office [phone]  Secure chat preferred  04/16/2023,10:56 AM

## 2023-04-16 NOTE — Care Management Important Message (Signed)
 Important Message  Patient Details  Name: VON QUINTANAR MRN: [id number] Date of Birth: 1940/08/10   Important Message Given:  Yes - Medicare IM     Renie Ora 04/16/2023, 9:38 AM

## 2023-04-16 NOTE — Progress Notes (Signed)
 Pt given ice water to drink without issue. Pt ate applesauce without issue. Per report pt given pills one at a time whole in applesauce. Pt took all pills with applesauce and after finishing applesauce spit out vitamins whole. Will notify MD. Pt has potassium po ordered and not sure he will take. Pt resting with call bell within reach.  Will continue to monitor.

## 2023-04-16 NOTE — Progress Notes (Signed)
   Palliative Medicine Inpatient Follow Up Note HPI: 83 y.o. 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 Has had worsening dyspnea related to eart failure decompensation.   Palliative case has been asked to get involved to support additional goals of care conversations.   Today's Discussion 04/16/2023  *Please note that this is a verbal dictation therefore any spelling or grammatical errors are due to the "Dragon Medical One" system interpretation.  Chart reviewed inclusive of vital signs, progress notes, laboratory results, and diagnostic images.   I met with [name] at bedside this morning. He is pleasantly confused. He is in no distress this morning and denies nausea, shortness of breath, or pain.   I spoke to patients wife [name] this morning. I explained to her that as of presently patients goals appear to be for improvement. [name] states that she feels overwhelmed by everything. I was able to take the time to explain my role on Palliative care as [name] is inpatient. I shared what would likely be done when he transitions home. I also offered for her to speak to a Hospice/Palliative liaison which she does want to pursue.   Questions and concerns addressed/Palliative Support Provided.   Objective Assessment: Vital Signs Vitals:   04/16/23 1105 04/16/23 1112  BP: 90/69 102/64  Pulse:    Resp:  16  Temp:    SpO2:  99%    Intake/Output Summary (Last 24 hours) at 04/16/2023 1128 Last data filed at 04/16/2023 0841 Gross per 24 hour  Intake 300 ml  Output 1150 ml  Net -850 ml   Last Weight  Most recent update: 04/16/2023  3:52 AM    Weight  100.2 kg (220 lb 14.4 oz)            Gen: Elderly Caucasian male chronically ill in appearance HEENT: moist mucous membranes CV: Irregular rate  and rhythm  PULM: On room air breathing is even and nonlabored ABD: soft/nontender  EXT: (+) L toe wound Neuro: Disoriented  SUMMARY OF RECOMMENDATIONS   DNAR/DNI   Have obtained a copy of Living Will   Goals are for patient to get home once medically optimized and return to his previous baseline level of function   Institute strict delirium precautions  Appreciate [facility] calling patients wife and better explaining home Palliative services   Plan for ongoing palliative care support  Billing based on MDM: Moderate ______________________________________________________________________________________ [name] Palliative Medicine Team Team Cell Phone: [phone] Please utilize secure chat with additional questions, if there is no response within 30 minutes please call the above phone number  Palliative Medicine Team providers are available by phone from 7am to 7pm daily and can be reached through the team cell phone.  Should this patient require assistance outside of these hours, please call the patient's attending physician.

## 2023-04-16 NOTE — Progress Notes (Signed)
 [facility] [phone] Liaison Note:  Notified by [facility] manager of patient/family request for [facility] Palliative services at home after discharge.  Discussed Outpatient Palliative Care services with spouse.   Please call with any hospice or outpatient palliative care related questions.   Thank you for the opportunity to participate in this patient's care.   Glenna Fellows, BSN, RN, OCN [employer] [phone]

## 2023-04-16 NOTE — Telephone Encounter (Signed)
 Darian called from [facility]. Patient is in [address]. He wants Dr.Duda  to check patient's wound because he thinks it is infected. Call back for Darian if needed. [phone]

## 2023-04-16 NOTE — Progress Notes (Signed)
 Pharmacy Antibiotic Note  [name] is a 83 y.o. male admitted on 04/12/2023 with  wound infection .  Pharmacy has been consulted for vancomycin and cefepime dosing.  Plan: Cefepime 2 grams IV every 12 hours Vancomycin 2000 mg IV x 1, followed by Vancomycin 1750 mg IV every 36 hrs  estAUC 511.6, SCr used 1.11, Vd 0.5 Monitor clinical progress, cultures/sensitivities, renal function, abx plan Vancomycin levels as indicated   Height: 5\' 9"  (175.3 cm) Weight: 100.2 kg (220 lb 14.4 oz) IBW/kg (Calculated) : 70.7  Temp (24hrs), Avg:97.7 F (36.5 C), Min:97.5 F (36.4 C), Max:98.2 F (36.8 C)  Recent Labs  Lab 04/12/23 0850 04/12/23 0918 04/13/23 0402 04/13/23 2259 04/14/23 0814 04/14/23 1647 04/15/23 0313 04/16/23 0346  WBC 14.8*  --  12.2*  --  11.4*  --  11.4* 13.3*  CREATININE 1.91*  --  1.67*  --  1.72* 1.67* 1.30* 1.11  LATICACIDVEN  --  8.6*  --  1.7  --   --   --   --     Estimated Creatinine Clearance: 57.8 mL/min (by C-G formula based on SCr of 1.11 mg/dL).    Allergies  Allergen Reactions   Ambien [Zolpidem Tartrate] Other (See Comments)    Confusion Memory loss   Aspirin Other (See Comments)    Bleeding ulcer   Cosopt [Dorzolamide Hcl-Timolol Mal] Other (See Comments)    Red and burning eyes   Diamox [Acetazolamide] Other (See Comments)    Unknown reaction    Antimicrobials this admission: 4/7 vanc >>  4/7 cefepime >>   Dose adjustments this admission:  Microbiology results: 4/3 BCx: ng x 4d   Thank you for allowing pharmacy to be a part of this patient's care.   Signe Colt, PharmD 04/16/2023 12:13 PM  **Pharmacist phone directory can be found on [email or web address] listed under [facility]**

## 2023-04-16 NOTE — Progress Notes (Addendum)
 Progress Note   Patient: [name] UJW:[id number] DOB: 1940-06-03 DOA: 04/12/2023     4 DOS: the patient was seen and examined on 04/16/2023   Brief hospital course: [name] was admitted to the hospital with the working diagnosis of symptomatic anemia, complicated with heart failure decompensation   83 y.o. 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  Last couple of days patient has been not feeling well.  He was noted to have easy fatigability, and dyspnea on exertion, to the point where he did not wanted to get out of the bed any more. His po intake had significantly decreased over the last 48 hrs.   Not clear if he was having melena (in the past he had intermittent gastrointestinal bleeding). No significant bleeding from his left foot wound for the last 2 days, but it did bled when he got home.  On his initial physical examination his blood pressure was 140/80, HR 74, RR 16 and 02 saturation 93%.  Cardiovascular with S1 and S2 present and regular, positive systolic murmur at the base with no gallops or rubs Respiratory with bilateral rales on anterior auscultation, diffusely, with no wheezing or rhonchi Abdomen not distended or tender, no ecchymosis  No lower extremity edema  Left foot surgical wound with crust in place and signs of active bleeding, small amount of drainage. No erythema or local edema.   Na 133, K 5,1 Cl 98 bicarbonate 18 glucose 258, bun 55 cr 1,91  Anion gap 17  AST 686 ALT 577 BNP 1,901  Lactic acid 8.6 Wbc 14.8 hgb 4.1 (from 9,1 on 03/16/23), plt 395  Fecal occult test negative    CT head with no acute changes.    Chest radiograph with cardiomegaly, with bilateral hilar vascular congestion, bilateral central interstitial infiltrates, with  bilateral pleural effusion  Left foot radiograph with sp proximal transmetatarsal amputation of the fifth toe. No acute osseous abnormality, no radiographic evidence of acute osteomyelitis   EKG 74 bpm, normal axis, normal intervals, qtc 490, sinus rhythm with no significant ST segment or  T wave changes.   04/03 2 units PRBC transfusion.  Patient was placed on IV furosemide for diuresis.  04/04 hgb 7.6. for 2 more units PRBC transfusion, volume status has improved.   04/05 patient with agitation and pain, has developed atrial fibrillation with rapid ventricular response. Echocardiogram with reduced LV systolic function.  04/06 blood pressure more stable, resumed apixaban.  04/ 07 continue hemodynamically stable, mentation improving but not back to baseline.   Assessment and Plan: * Symptomatic anemia No clinical signs of active GI bleeding.   SP 4 units PRBC transfusion.  Follow up hgb is 9,1   Serum iron 28, TIBC 347, transferrin saturation 8, ferritin 30, transferrin 242.   Continue with po pantoprazole 40 mg daily  Add IV iron infusion Follow up cell count in am.  Acute on chronic systolic CHF (congestive heart failure) (HCC) Echocardiogram with reduced LV systolic function 30 to 35%, global hypokinesis, RV systolic function preserved, RVSP 35.2 mmHg, LA and RA with mild dilatation, severe mitral valve regurgitation, moderate mitral valve stenosis, moderate aortic valve stenosis, mild to moderate tricuspid valve regurgitation, no pericardial effusion   Urine output is  1,500 ml  Systolic blood pressure 100 mmHg range  Continue carvedilol for atrial fibrillation rate  control. (Caution due to low EF)   Continue to hold on diuresis due to risk of hypotension  Hold on RAAS inhibition due to risk of hypotension   Elevated liver enzymes possible liver congestion, liver enzymes are trending down with ALT at 175 and AST at 327   Acute metabolic encephalopathy Multifactorial  encephalopathy, intermittent agitation, consistent with delirium.  Clinically improving.   Tolerating well dysphagia 3 with aspiration precautions.  Pain control with oxycodone and hydromorphone.  Haldol as needed for agitation. Continue high dose thiamine and multivitamins.  Neuro checks per unit protocol.   PAF (paroxysmal atrial fibrillation) (HCC) Patient with atrial fibrillation and atrial flutter with rapid ventricular response.   Plan to continue for now with carvedilol, with caution due to low EF  If worsening may need amiodarone No signs of bleeding, will start patient on apixaban for anticoagulation.   Keep K at 4 and Mg at 2.  Treat pain and agitation.   Chronic kidney disease, stage 3a (HCC) AKI, Hyponatremia, Lactic acidosis. Hypokalemia.   Improved volume status, renal function with serum cr at 1,1 with K at 3,8 and serum bicarbonate at 27  Na 136 and Mg 2,2   Continue K correction with KCl, will give 40 meq  Follow up renal function and electrolytes in am.  Avoid hypotension and nephrotoxic medications.  Essential hypertension Continue blood pressure monitoring  Type 2 diabetes mellitus with obesity (HCC) Hyperglycemia.   Fasting glucose today 223, capillary glucose 321, 314, 221  Continue insulin sliding scale for glucose cover and monitoring, will change intensity to moderate.   Continue with basal insulin 10 units and continue close monitoring.  Continue calculate requirements, will likely need pre meal dosing of insulin.   Hold on oral hypoglycemic agents.   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   Will resume patient on antibiotic therapy with Vancomycin and cefepime, will  consult orthopedics in am to follow up on wound.         Subjective: Patient this morning with no agitation, very  weak and deconditioned, reports intermittent pain on his left foot. Has been tolerating po well.   Physical Exam: Vitals:   04/15/23 2305 04/16/23 0245 04/16/23 0600 04/16/23 0724  BP: (!) 109/56 109/76 (!) 113/52 91/77  Pulse: (!) 109     Resp: 18 14 14 19   Temp: 97.6 F (36.4 C) (!) 97.5 F (36.4 C)  97.6 F (36.4 C)  TempSrc: Oral Oral  Oral  SpO2: 96%   96%  Weight:  100.2 kg    Height:       Neurology awake and alert, deconditioned and ill looking appearing, responds to simple questions and follows simple commands. Decreased hearing  ENT with mild pallor with no icterus Cardiovascular with S1 and S2 present, irregularly irregular with no gallops or rubs, positive systolic murmur at the base and systolic murmur at the base Respiratory with no rales or wheezing, no rhonchi  Abdomen with no distention  No lower extremity edema, left foot with dressing in place.   Data Reviewed:    Family Communication: no family at the bedside   Disposition: Status is: Inpatient Remains inpatient appropriate because: Recovering heart failure   Planned Discharge Destination:  to be determined      Author: Coralie Keens, MD 04/16/2023 11:09 AM  For on call review [email or web address].

## 2023-04-17 DIAGNOSIS — I5023 Acute on chronic systolic (congestive) heart failure: Secondary | ICD-10-CM | POA: Diagnosis not present

## 2023-04-17 DIAGNOSIS — G9341 Metabolic encephalopathy: Secondary | ICD-10-CM | POA: Diagnosis not present

## 2023-04-17 DIAGNOSIS — Z7189 Other specified counseling: Secondary | ICD-10-CM | POA: Diagnosis not present

## 2023-04-17 DIAGNOSIS — D649 Anemia, unspecified: Secondary | ICD-10-CM | POA: Diagnosis not present

## 2023-04-17 DIAGNOSIS — I480 Paroxysmal atrial fibrillation: Secondary | ICD-10-CM | POA: Diagnosis not present

## 2023-04-17 DIAGNOSIS — Z515 Encounter for palliative care: Secondary | ICD-10-CM | POA: Diagnosis not present

## 2023-04-17 LAB — BASIC METABOLIC PANEL WITH GFR
Anion gap: 14 (ref 5–15)
BUN: 20 mg/dL (ref 8–23)
CO2: 26 mmol/L (ref 22–32)
Calcium: 8.7 mg/dL — ABNORMAL LOW (ref 8.9–10.3)
Chloride: 96 mmol/L — ABNORMAL LOW (ref 98–111)
Creatinine, Ser: 1.13 mg/dL (ref 0.61–1.24)
GFR, Estimated: >60 mL/min (ref >60)
Glucose, Bld: 186 mg/dL — ABNORMAL HIGH (ref 70–99)
Potassium: 4 mmol/L (ref 3.5–5.1)
Sodium: 136 mmol/L (ref 135–145)

## 2023-04-17 LAB — GLUCOSE, CAPILLARY
Glucose-Capillary: 241 mg/dL — ABNORMAL HIGH (ref 70–99)
Glucose-Capillary: 270 mg/dL — ABNORMAL HIGH (ref 70–99)
Glucose-Capillary: 268 mg/dL — ABNORMAL HIGH (ref 70–99)
Glucose-Capillary: 196 mg/dL — ABNORMAL HIGH (ref 70–99)

## 2023-04-17 LAB — CBC
HCT: 33.1 % — ABNORMAL LOW (ref 39.0–52.0)
Hemoglobin: 9.7 g/dL — ABNORMAL LOW (ref 13.0–17.0)
MCH: 25.2 pg — ABNORMAL LOW (ref 26.0–34.0)
MCHC: 29.3 g/dL — ABNORMAL LOW (ref 30.0–36.0)
MCV: 86 fL (ref 80.0–100.0)
Platelets: 211 10*3/uL (ref 150–400)
RBC: 3.85 MIL/uL — ABNORMAL LOW (ref 4.22–5.81)
RDW: 18.6 % — ABNORMAL HIGH (ref 11.5–15.5)
WBC: 12.3 10*3/uL — ABNORMAL HIGH (ref 4.0–10.5)
nRBC: 0.4 % — ABNORMAL HIGH (ref 0.0–0.2)

## 2023-04-17 LAB — CULTURE, BLOOD (ROUTINE X 2)
Culture: NO GROWTH
Culture: NO GROWTH

## 2023-04-17 LAB — MAGNESIUM: Magnesium: 2.1 mg/dL (ref 1.7–2.4)

## 2023-04-17 MED ORDER — IRON SUCROSE 300 MG IVPB - SIMPLE MED
300.0000 mg | Freq: Once | Status: DC
  Filled 2023-04-17: qty 265

## 2023-04-17 MED ORDER — SODIUM CHLORIDE 0.9 % IV SOLN
300.0000 mg | Freq: Once | INTRAVENOUS | Status: AC
  Administered 2023-04-17: 300 mg via INTRAVENOUS
  Filled 2023-04-17: qty 15

## 2023-04-17 MED ORDER — LIVING BETTER WITH HEART FAILURE BOOK: Freq: Once | Status: AC

## 2023-04-17 NOTE — Progress Notes (Signed)
 Speech Language Pathology Treatment: Dysphagia  Patient Details Name: [name] MRN: [id number] DOB: 1940/03/15 Today's Date: 04/17/2023 Time: 9629-5284 SLP Time Calculation (min) (ACUTE ONLY): 15 min  Assessment / Plan / Recommendation Clinical Impression  Skilled therapy session focused on dysphagia goals. MBS completed yesterday (4/7) recommended dysphagia 3/ thin liquids. Per report "No penetration/aspiration observed, though occasional coughing without airway invasion." SLP observed patient during consumption of D3 solids/ thin liquids via straw. Patient with adequate oral clearance and mastication times, though with occasional immediate cough, which may be indicative of bolus misdirection vs hypersensitivity. RN reports patient required consistent cues during PO consumption this AM and pills crushed in puree. Due to cognition and cues needed during mealtimes, recommend downgrade to Dysphagia 2/thin liquids. Medications may be administered crushed in puree. Patient should receive full supervision.    Of note, patient with limited verbalizations thoughout ST session with no attempts at use of gestures or other multimodal means of communication. Patient with consistent "grunting" in response to SLP questions.     HPI HPI: [name] is a 83 y.o. male who, per chart review, was noted by wife at home to have easy fatigability, and dyspnea on exertion, to the point where he did not wanted to get out of the bed any more. His po intake had significantly decreased over the last 48 hrs. CXR 4/3 suggestive of CHF or pulmonary edema.  Head CT 4/3 without acute findings.  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 MBS 4/7 rec dys 3/thin diet      SLP Plan  Continue with current plan  of care      Recommendations for follow up therapy are one component of a multi-disciplinary discharge planning process, led by the attending physician.  Recommendations may be updated based on patient status, additional functional criteria and insurance authorization.    Recommendations  Diet recommendations: Dysphagia 2 (fine chop);Thin liquid Liquids provided via: Cup;Straw Medication Administration: Crushed with puree Supervision: Full supervision/cueing for compensatory strategies Compensations: Slow rate;Small sips/bites;Minimize environmental distractions Postural Changes and/or Swallow Maneuvers: Seated upright 90 degrees                  Oral care BID   Frequent or constant Supervision/Assistance Dysphagia, oral phase (R13.11)     Continue with current plan of care     Ninoshka Wainwright M.A., CCC-SLP 04/17/2023, 3:25 PM

## 2023-04-17 NOTE — Evaluation (Signed)
 Physical Therapy Evaluation Patient Details Name: [name] MRN: [id number] DOB: 1940-10-28 Today's Date: 04/17/2023  History of Present Illness  [name] is a 83 y.o. male admitted for weakness, poor appetite, and melena. 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   Clinical Impression  Pt admitted with above. PTA pt with both medical and functional decline to where pt was unable to sit up. Prior to recent regression pt was requiring assist for all ADLs and to transfer in/out of w/c. Per wife pt has been w/c bound for the last 2 years and she has been providing pt bed baths. At this time patient requiring maxAX2 for all mobility, is confused, lacks initiation and active participation. Aware medical team is in discussion with family and palliative services regarding pt's prognosis. At this time recommending SNF in effort to improve patient mobility for safe transition home with supportive wife however will continue to reassess as medical plan progresses. Acute PT to cont to follow.        If plan is discharge home, recommend the following: Two people to help with walking and/or transfers;Two people to help with bathing/dressing/bathroom;Assist for transportation   Can travel by private vehicle   No    Equipment Recommendations  (TBD at next venue, possibly a hospital bed)  Recommendations for Other Services       Functional Status Assessment Patient has had a recent decline in their functional status and/or demonstrates limited ability to make significant improvements in function in a reasonable and predictable amount of time     Precautions / Restrictions Precautions Precautions: Fall Precaution/Restrictions Comments: recent L 5th toe amp Restrictions Weight Bearing Restrictions Per Provider Order: No      Mobility  Bed  Mobility Overal bed mobility: Needs Assistance Bed Mobility: Supine to Sit, Sit to Supine     Supine to sit: Max assist, +2 for physical assistance, HOB elevated Sit to supine: Max assist, +2 for safety/equipment, HOB elevated   General bed mobility comments: max multimodal directional cues, maxA for LE management to EOB and trunk elevation, maxA to scoot to EOB    Transfers Overall transfer level: Needs assistance Equipment used: 2 person hand held assist (face to face transfer with gait belt and PT and OT) Transfers: Sit to/from Stand Sit to Stand: +2 physical assistance, Total assist           General transfer comment: unable to achieve full upright posture, no initiation from patient, bilat knees and hips flexed, completed 2nd 1/2 stand to lateral scoot up to [facility]    Ambulation/Gait               General Gait Details: unable at baseline  Stairs            Wheelchair Mobility     Tilt Bed    Modified Rankin (Stroke Patients Only)       Balance Overall balance assessment: Needs assistance Sitting-balance support: Feet supported, Bilateral upper extremity supported Sitting balance-Leahy Scale: Poor Sitting balance - Comments: varied from maxA to brief episodes of contact guard, pt with L lateral lean   Standing balance support: Bilateral upper extremity supported, During functional activity, Reliant on assistive device for balance Standing balance-Leahy Scale: Zero Standing balance comment: dependent on PT/OT, unable to achieve full standing  Pertinent Vitals/Pain Pain Assessment Pain Assessment: Faces Faces Pain Scale: Hurts a little bit Pain Location: generalized grimacing with mobility    Home Living Family/patient expects to be discharged to:: Private residence Living Arrangements: Spouse/significant other Available Help at Discharge: Family;Available 24 hours/day Type of Home: House Home Access:  Ramped entrance       Home Layout: One level Home Equipment: Wheelchair - Forensic psychologist (2 wheels)      Prior Function Prior Level of Function : Needs assist             Mobility Comments: heavy assist for bed mobility and assist in/out of w/c until recently when pt couldn't even sit up ADLs Comments: wife sponge baths him in the bed     Extremity/Trunk Assessment   Upper Extremity Assessment Upper Extremity Assessment: Defer to OT evaluation    Lower Extremity Assessment Lower Extremity Assessment: Generalized weakness (able to complete LAQ in sitting but not to full knee extension, limited active ankle ROM, LEs rest in external rotation)    Cervical / Trunk Assessment Cervical / Trunk Assessment: Kyphotic  Communication   Communication Communication: Impaired Factors Affecting Communication: Hearing impaired    Cognition Arousal: Alert Behavior During Therapy: Flat affect   PT - Cognitive impairments: Orientation, Sequencing, Problem solving   Orientation impairments: Place, Time, Situation                   PT - Cognition Comments: pt mumbling/groaning, muffled singing, delayed processing, poor to know initiation, alert but requiring max verbal cues to stay engaged and attempt to be interactive Following commands: Impaired Following commands impaired: Follows one step commands inconsistently, Follows one step commands with increased time     Cueing Cueing Techniques: Verbal cues, Tactile cues     General Comments General comments (skin integrity, edema, etc.): pt with bilat feet wrapped in dressings    Exercises     Assessment/Plan    PT Assessment Patient needs continued PT services  PT Problem List Decreased strength;Decreased range of motion;Decreased activity tolerance;Decreased balance;Decreased mobility       PT Treatment Interventions DME instruction;Gait training;Functional mobility training;Therapeutic activities;Therapeutic  exercise;Balance training    PT Goals (Current goals can be found in the Care Plan section)  Acute Rehab PT Goals Patient Stated Goal: didn't state PT Goal Formulation: With family Time For Goal Achievement: 05/01/23 Potential to Achieve Goals: Fair    Frequency Min 1X/week     Co-evaluation PT/OT/SLP Co-Evaluation/Treatment: Yes Reason for Co-Treatment: To address functional/ADL transfers PT goals addressed during session: Mobility/safety with mobility         AM-PAC PT "6 Clicks" Mobility  Outcome Measure Help needed turning from your back to your side while in a flat bed without using bedrails?: Total Help needed moving from lying on your back to sitting on the side of a flat bed without using bedrails?: Total Help needed moving to and from a bed to a chair (including a wheelchair)?: Total Help needed standing up from a chair using your arms (e.g., wheelchair or bedside chair)?: Total Help needed to walk in hospital room?: Total Help needed climbing 3-5 steps with a railing? : Total 6 Click Score: 6    End of Session Equipment Utilized During Treatment: Gait belt Activity Tolerance: Patient tolerated treatment well Patient left: in bed;with call bell/phone within reach;with bed alarm set;with family/visitor present Nurse Communication: Mobility status PT Visit Diagnosis: Unsteadiness on feet (R26.81);Muscle weakness (generalized) (M62.81)    Time: 1610-9604 PT  Time Calculation (min) (ACUTE ONLY): 27 min   Charges:   PT Evaluation $PT Eval Moderate Complexity: 1 Mod   PT General Charges $$ ACUTE PT VISIT: 1 Visit         Lewis Shock, PT, DPT Acute Rehabilitation Services Secure chat preferred Office #: [phone]   Iona Hansen 04/17/2023, 12:35 PM

## 2023-04-17 NOTE — Progress Notes (Signed)
 Late note: 1800 Pt heart rate sustained 150's and agitated trying to get out of bed. Haldol, coreg and prn metoprolol given (121/88 bp best for day). Patient ate applesauce and pears. HR 110's and patient went to sleep, MD aware. Updated PM shift.

## 2023-04-17 NOTE — Progress Notes (Signed)
 Palliative Medicine Inpatient Follow Up Note HPI: 83 y.o. 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 Has had worsening dyspnea related to eart failure decompensation.   Palliative case has been asked to get involved to support additional goals of care conversations.   Today's Discussion 04/17/2023  *Please note that this is a verbal dictation therefore any spelling or grammatical errors are due to the "[employer] One" system interpretation.  Chart reviewed inclusive of vital signs, progress notes, laboratory results, and diagnostic images.   I met with [name] at bedside this morning. He is in bed quite disoriented this morning. He is having "neck pain" per his vocalization with me. I asked if I could adjust his pillow which he  allowed me to do.   Per communication with the nursing staff, [name] has remained disoriented and is eating very little overall.   I called patients wife, [name] this morning. We reviewed the possibilities moving forward. [name] shares that this whole hospitalization has been difficult for her. She feels that [name] has never been in such poor health. We discussed his overall clinical state and the options moving forward. I shared that as of presently the primary team is treating him with antibiotics with the hope(s) of improvement. [name] notes that he has been "talking to his mother and father" who are deceased. We discussed what this may mean in the larger picture.   If [name] should neglect to improve we discussed hospice. I shared I described hospice as a service for patients who have a life expectancy of 6 months or less.  The goal of hospice is the preservation of dignity and quality at the end phases of life. Under hospice care, the focus changes from curative to  symptom relief.   [name] shares that she would like to speak to Dr. Ella Jubilee further to determine the best course of action. I stated that I would make him aware of this.   Questions and concerns addressed/Palliative Support Provided.   Objective Assessment: Vital Signs Vitals:   04/17/23 0301 04/17/23 0735  BP: 100/60 127/85  Pulse: (!) 115 (!) 115  Resp: 14 20  Temp: 97.8 F (36.6 C) 97.6 F (36.4 C)  SpO2:  95%    Intake/Output Summary (Last 24 hours) at 04/17/2023 0931 Last data filed at 04/16/2023 1600 Gross per 24 hour  Intake --  Output 300 ml  Net -300 ml   Last Weight  Most recent update: 04/16/2023  3:52 AM    Weight  100.2 kg (220 lb 14.4 oz)            Gen: Elderly Caucasian male chronically ill in appearance HEENT: moist mucous membranes CV: Irregular rate and rhythm  PULM: On room air breathing is even and nonlabored ABD: soft/nontender  EXT: (+) L toe/foot wound - necrotic tissue visualized Neuro: Disoriented  SUMMARY OF RECOMMENDATIONS   DNAR/DNI   Living Will obtained   Goals are for patient to get home once medically optimized and return to his previous baseline level of function though this is seeming less likely as the days go on per his family's assessment   Institute strict delirium precautions  Appreciate [facility] involvement regarding the option of OP Palliative support  Plan for ongoing palliative care support  Billing based on MDM: High ______________________________________________________________________________________ Lamarr Lulas [facility] Palliative Medicine Team  Team Cell Phone: [phone] Please utilize secure chat with additional questions, if there is no response within 30 minutes please call the above phone number  Palliative Medicine Team providers are available by phone from 7am to 7pm daily and can be reached through the team cell phone.  Should this patient require assistance outside of these hours, please call  the patient's attending physician.

## 2023-04-17 NOTE — Progress Notes (Signed)
 Patient ID: [name], male   DOB: October 30, 1940, 83 y.o.   MRN: [id number] Patient is 1 month status post left foot fifth ray amputation.  Patient has a strong palpable dorsalis pedis pulse however patient has developed a necrotic eschar over the ray amputation.  Patient's problem is primarily microcirculatory in nature.  Would continue cleansing with soap and water and dry dressing changes daily.  No surgical intervention indicated at this time.

## 2023-04-17 NOTE — Plan of Care (Signed)
  Problem: Clinical Measurements: Goal: Ability to maintain clinical measurements within normal limits will improve Outcome: Progressing Goal: Will remain free from infection Outcome: Progressing Goal: Diagnostic test results will improve Outcome: Progressing Goal: Respiratory complications will improve Outcome: Progressing Goal: Cardiovascular complication will be avoided Outcome: Progressing   Problem: Coping: Goal: Level of anxiety will decrease Outcome: Progressing   Problem: Elimination: Goal: Will not experience complications related to bowel motility Outcome: Progressing Goal: Will not experience complications related to urinary retention Outcome: Progressing   Problem: Pain Managment: Goal: General experience of comfort will improve and/or be controlled Outcome: Progressing   Problem: Safety: Goal: Ability to remain free from injury will improve Outcome: Progressing   Problem: Skin Integrity: Goal: Risk for impaired skin integrity will decrease Outcome: Progressing   Problem: Education: Goal: Knowledge of General Education information will improve Description: Including pain rating scale, medication(s)/side effects and non-pharmacologic comfort measures Outcome: Not Progressing   Problem: Health Behavior/Discharge Planning: Goal: Ability to manage health-related needs will improve Outcome: Not Progressing   Problem: Activity: Goal: Risk for activity intolerance will decrease Outcome: Not Progressing   Problem: Nutrition: Goal: Adequate nutrition will be maintained Outcome: Not Progressing

## 2023-04-17 NOTE — Progress Notes (Signed)
 Progress Note   Patient: [name] ZOX:[id number] DOB: 1940/11/17 DOA: 04/12/2023     5 DOS: the patient was seen and examined on 04/17/2023   Brief hospital course: [name] was admitted to the hospital with the working diagnosis of symptomatic anemia, complicated with heart failure decompensation   83 y.o. 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  Last couple of days patient has been not feeling well.  He was noted to have easy fatigability, and dyspnea on exertion, to the point where he did not wanted to get out of the bed any more. His po intake had significantly decreased over the last 48 hrs.   Not clear if he was having melena (in the past he had intermittent gastrointestinal bleeding). No significant bleeding from his left foot wound for the last 2 days, but it did bled when he got home.  On his initial physical examination his blood pressure was 140/80, HR 74, RR 16 and 02 saturation 93%.  Cardiovascular with S1 and S2 present and regular, positive systolic murmur at the base with no gallops or rubs Respiratory with bilateral rales on anterior auscultation, diffusely, with no wheezing or rhonchi Abdomen not distended or tender, no ecchymosis  No lower extremity edema  Left foot surgical wound with crust in place and signs of active bleeding, small amount of drainage. No erythema or local edema.   Na 133, K 5,1 Cl 98 bicarbonate 18 glucose 258, bun 55 cr 1,91  Anion gap 17  AST 686 ALT 577 BNP 1,901  Lactic acid 8.6 Wbc 14.8 hgb 4.1 (from 9,1 on 03/16/23), plt 395  Fecal occult test negative    CT head with no acute changes.    Chest radiograph with cardiomegaly, with bilateral hilar vascular congestion, bilateral central interstitial infiltrates, with  bilateral pleural effusion  Left foot radiograph with sp proximal transmetatarsal amputation of the fifth toe. No acute osseous abnormality, no radiographic evidence of acute osteomyelitis   EKG 74 bpm, normal axis, normal intervals, qtc 490, sinus rhythm with no significant ST segment or  T wave changes.   04/03 2 units PRBC transfusion.  Patient was placed on IV furosemide for diuresis.  04/04 hgb 7.6. for 2 more units PRBC transfusion, volume status has improved.   04/05 patient with agitation and pain, has developed atrial fibrillation with rapid ventricular response. Echocardiogram with reduced LV systolic function.  04/06 blood pressure more stable, resumed apixaban.  04/ 07 continue hemodynamically stable, mentation improving but not back to baseline.  04/08 continue very weak and deconditioned, may need SNF.   Assessment and Plan: * Symptomatic anemia No clinical signs of active GI bleeding.   SP 4 units PRBC transfusion.  Follow up hgb is 9,7   Serum iron 28, TIBC 347, transferrin saturation 8, ferritin 30, transferrin 242.  IV iron infusion today  Continue with po pantoprazole 40 mg daily  Continue follow up cell count.   Acute on chronic systolic CHF (congestive heart failure) (HCC) Echocardiogram with reduced LV systolic function 30 to 35%, global hypokinesis, RV systolic function preserved, RVSP 35.2 mmHg, LA and RA with mild dilatation, severe mitral valve regurgitation, moderate mitral valve stenosis, moderate aortic valve stenosis, mild to moderate tricuspid valve regurgitation, no pericardial effusion   Urine output is  700 ml  Systolic blood pressure  100 mmHg range  Continue carvedilol for atrial fibrillation rate control. (Caution due to low EF)   Continue to hold on diuresis due to risk of hypotension  Hold on RAAS inhibition due to risk of hypotension   Elevated liver enzymes possible liver congestion, liver enzymes are trending down with ALT at 175 and AST at  327   Acute metabolic encephalopathy Multifactorial encephalopathy, intermittent agitation, consistent with delirium.  Clinically improving.   Tolerating well dysphagia 3 with aspiration precautions.  Pain control with oxycodone and hydromorphone.  Haldol as needed for agitation. Completed 5 days of high dose thiamine.  Continue multivitamins.  Neuro checks per unit protocol.   PAF (paroxysmal atrial fibrillation) (HCC) Patient with atrial fibrillation and atrial flutter with rapid ventricular response.   Plan to continue for now with carvedilol, with caution due to low EF  If worsening may need amiodarone Anticoagulation with apixaban  Keep K at 4 and Mg at 2.  Treat pain and agitation.   Chronic kidney disease, stage 3a (HCC) AKI, Hyponatremia, Lactic acidosis. Hypokalemia.   Renal function with serum cr at 1,13 with K at 4,0 and serum bicarbonate at 26  Na 136 and Mg 2,1   Follow up renal function and electrolytes in am.  Avoid hypotension and nephrotoxic medications.  Essential hypertension Continue blood pressure monitoring  Type 2 diabetes mellitus with obesity (HCC) Hyperglycemia.   Fating glucose today is 186 mg/dl  Continue insulin sliding scale for glucose cover and monitoring,  Continue with basal insulin 10 units and continue close monitoring.  Continue calculate requirements, will likely need pre meal dosing of insulin.   Hold on oral hypoglycemic agents.   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       Subjective: Patient continue very weak and deconditioned, today mittens are off, denies any dyspnea or chest pain   Physical Exam: Vitals:   04/16/23 2253 04/17/23 0301 04/17/23 0735 04/17/23 1148  BP: 98/65 100/60 127/85 92/76   Pulse: (!) 107 (!) 115 (!) 115 (!) 103  Resp: 15 14 20 20   Temp:  97.8 F (36.6 C) 97.6 F (36.4 C) 97.7 F (36.5 C)  TempSrc:  Oral Axillary Oral  SpO2: 95%  95% 95%  Weight:      Height:       Neurology awake and alert ENT with mild pallor with no icterus Cardiovascular with S1 and S2 present, irregularly irregular with no gallops or rubs, positive complex murmur systolic and diastolic at the apex and systolic in crescendo de crescendo at the base No JVD No lower extremity edema Respiratory with mild rales with no wheezing or rhonchi, poor inspiratory effort Abdomen with no distention and non tender Left foot with dressing in place   Data Reviewed:    Family Communication: I spoke with patient's wife at the bedside, we talked in detail about patient's condition, plan of care and prognosis and all questions were addressed. Plan to continue current line of therapy, with goal of possible transfer to SNF.    Disposition: Status is: Inpatient Remains inpatient appropriate because: IV antibiotics   Planned Discharge Destination: Skilled nursing facility     Author: Coralie Keens, MD 04/17/2023 12:42 PM  For on call review [email or web address].

## 2023-04-17 NOTE — Progress Notes (Signed)
 PROGRESS NOTE    CHESTON COURY  ZOX:[id number] DOB: June 22, 1940 DOA: 04/12/2023 PCP: Thana Ates, MD  83/M w T2DM, hypertension, hyperlipidemia, PAfib, dyslipidemia, diabetic neuropathy, venous insufficiency, and hx of GI bleed, debilitated/wheel chair bound,  who had a recent left foot 5th ray amputation due to osteomyelitis, on 03/16/23.  He noted easy fatigability, and dyspnea on exertion, -In ED, VSS, Left foot surgical wound with crust in place and signs of active bleeding, small amount of drainage. Labs -bun 55 cr 1,91 AST 686 ALT 577, BNP 1,901 Lactic acid 8.6, Wbc 14.8 hgb 4.1 (from 9,1 on 03/16/23), plt 395 Fecal occult test negative   CXR wvascular congestion, bilateral central interstitial infiltrates, with bilateral pleural effusion  -Left foot radiograph with sp proximal transmetatarsal amputation of the fifth toe. no radiographic evidence of acute osteomyelitis  -4/03 2 units PRBC transfusion. placed on IV furosemide for diuresis.  -4/04 hgb 7.6. for 2 more units PRBC transfusion, volume status has improved.   -4/05 patient with agitation and pain, has developed atrial fibrillation with rapid ventricular response. Echo with reduced EF  -4/06 blood pressure more stable, resumed apixaban.  -4/ 07 mentation improving, palliative consulted -4/08 weak and deconditioned, may need SNF.   Subjective: remains confused  Assessment and Plan:  Iron defi anemia -hemoccult neg -SP 4 units PRBC transfusion.  -Anemia panel w/ Iron defi -sp IV iron,  -not a good candidate for GI workup now esp w/ agitation and confusion -will need GI follow up if he ever improves  Acute on chronic systolic CHF Severe MR Mod AS -Echo w/ EF 30 to 35%, global hypokinesis, RV systolic function preserved, severe mitral valve regurgitation, moderate mitral valve stenosis, moderate aortic valve stenosis,  -Continue carvedilol -GDMT limited by hypotension -Not appropriate for ischemic eval with confusion,  encephalopathy -Diuretics on hold at this time, prognosis is poor -May need hospice  Acute metabolic encephalopathy Multifactorial encephalopathy-likely secondary to necrotic wound, infection, ongoing intermittent agitation throughout this admission --dysphagia 3 with aspiration precautions.  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 Lajoyce Corners yesterday, dorsalis pedis strong pulse however felt to be primarily microcirculatory disease, recommended wound care -Currently on vancomycin and cefepime -DC cefepime with ongoing encephalopathy  PAF (paroxysmal atrial fibrillation) (HCC) Patient with atrial fibrillation and atrial flutter with rapid ventricular response.  -continue carvedilol,  Anticoagulation with apixaban  Chronic kidney disease, stage 3a (HCC) AKI, Hyponatremia, Lactic acidosis. Hypokalemia.  monitor  Type 2 diabetes mellitus with obesity (HCC) Hyperglycemia.  -basal insulin 10 units , ssi  Obesity, class 1 Calculated BMI is 33.9    DVT prophylaxis: apixaban Code Status: DNR Family Communication: No family at bedside, called and updated spouse Disposition Plan: To be determined  Consultants:    Procedures:   Antimicrobials:    Objective: Vitals:   04/17/23 0301 04/17/23 0735 04/17/23 1148 04/17/23 1700  BP: 100/60 127/85 92/76 99/62   Pulse: (!) 115 (!) 115 (!) 103 (!) 105  Resp: 14 20 20 13   Temp: 97.8 F (36.6 C) 97.6 F (36.4 C) 97.7 F (36.5 C) 97.7 F (36.5 C)  TempSrc: Oral Axillary Oral Oral  SpO2:  95% 95% 93%  Weight:      Height:        Intake/Output Summary (Last 24 hours) at 04/17/2023 1830 Last data filed at 04/17/2023 1715 Gross per  24 hour  Intake 298.64 ml  Output --  Net 298.64 ml   Filed Weights   04/13/23 0401 04/15/23 0300 04/16/23 0245  Weight: 100.5 kg 98.9 kg 100.2 kg     Examination:     Data Reviewed:   CBC: Recent Labs  Lab 04/12/23 0850 04/12/23 1051 04/13/23 0402 04/14/23 0814 04/14/23 1647 04/15/23 0313 04/16/23 0346 04/17/23 0321  WBC 14.8*  --  12.2* 11.4*  --  11.4* 13.3* 12.3*  NEUTROABS 12.7*  --   --   --   --   --   --   --   HGB 4.1*   < > 7.6* 8.4* 9.2* 8.9* 9.1* 9.7*  HCT 15.4*   < > 24.0* 26.8* 29.8* 29.2* 30.6* 33.1*  MCV 83.2  --  78.2* 79.5*  --  82.3 84.5 86.0  PLT 395  --  309 257  --  223 225 211   < > = values in this interval not displayed.   Basic Metabolic Panel: Recent Labs  Lab 04/14/23 0814 04/14/23 1647 04/15/23 0313 04/16/23 0346 04/17/23 0321  NA 141 135 136 136 136  K 2.6* 3.8 3.2* 3.8 4.0  CL 93* 92* 94* 95* 96*  CO2 33* 29 29 27 26   GLUCOSE 225* 285* 225* 223* 186*  BUN 40* 39* 31* 25* 20  CREATININE 1.72* 1.67* 1.30* 1.11 1.13  CALCIUM 8.6* 8.4* 8.4* 8.7* 8.7*  MG 1.8  --   --  2.2 2.1   GFR: Estimated Creatinine Clearance: 56.8 mL/min (by C-G formula based on SCr of 1.13 mg/dL). Liver Function Tests: Recent Labs  Lab 04/12/23 0850 04/14/23 0814  AST 686* 175*  ALT 577* 327*  ALKPHOS 87 88  BILITOT 1.9* 4.4*  PROT 6.5 6.9  ALBUMIN 2.5* 3.0*   No results for input(s): "LIPASE", "AMYLASE" in the last 168 hours. No results for input(s): "AMMONIA" in the last 168 hours. Coagulation Profile: Recent Labs  Lab 04/12/23 0850  INR 2.6*   Cardiac Enzymes: No results for input(s): "CKTOTAL", "CKMB", "CKMBINDEX", "TROPONINI" in the last 168 hours. BNP (last 3 results) No results for input(s): "PROBNP" in the last 8760 hours. HbA1C: Recent Labs    04/16/23 1227  HGBA1C 5.6   CBG: Recent Labs  Lab 04/16/23 1857 04/16/23 2056 04/17/23 0558 04/17/23 1150 04/17/23 1640  GLUCAP 210* 196* 241* 270* 268*   Lipid Profile: No results for input(s): "CHOL", "HDL", "LDLCALC", "TRIG", "CHOLHDL", "LDLDIRECT" in the last 72 hours. Thyroid Function Tests: No results for input(s):  "TSH", "T4TOTAL", "FREET4", "T3FREE", "THYROIDAB" in the last 72 hours. Anemia Panel: Recent Labs    04/16/23 0346  FERRITIN 30  TIBC 347  IRON 28*   Urine analysis:    Component Value Date/Time   COLORURINE YELLOW 04/12/2023 2129   APPEARANCEUR CLEAR 04/12/2023 2129   LABSPEC 1.015 04/12/2023 2129   PHURINE 5.5 04/12/2023 2129   GLUCOSEU NEGATIVE 04/12/2023 2129   HGBUR NEGATIVE 04/12/2023 2129   BILIRUBINUR NEGATIVE 04/12/2023 2129   KETONESUR NEGATIVE 04/12/2023 2129   PROTEINUR NEGATIVE 04/12/2023 2129   NITRITE NEGATIVE 04/12/2023 2129   LEUKOCYTESUR SMALL (A) 04/12/2023 2129   Sepsis Labs: @LABRCNTIP (procalcitonin:4,lacticidven:4)  ) Recent Results (from the past 240 hours)  Blood Culture (routine x 2)     Status: None   Collection Time: 04/12/23  8:50 AM   Specimen: BLOOD RIGHT HAND  Result Value Ref Range Status   Specimen Description BLOOD RIGHT HAND  Final   Special Requests  Final    AEROBIC BOTTLE ONLY Blood Culture results may not be optimal due to an inadequate volume of blood received in culture bottles   Culture   Final    NO GROWTH 5 DAYS Performed at [facility], [address] [place], [place] [address]    Report Status 04/17/2023 FINAL  Final  Blood Culture (routine x 2)     Status: None   Collection Time: 04/12/23  8:50 PM   Specimen: BLOOD LEFT ARM  Result Value Ref Range Status   Specimen Description BLOOD LEFT ARM  Final   Special Requests   Final    BOTTLES DRAWN AEROBIC ONLY Blood Culture results may not be optimal due to an inadequate volume of blood received in culture bottles   Culture   Final    NO GROWTH 5 DAYS Performed at [facility], [address], [place], [place] [address]    Report Status 04/17/2023 FINAL  Final     Radiology Studies: DG Swallowing Func-Speech Pathology Result Date: 04/16/2023 Table formatting from the original result was not included. Modified Barium Swallow Study Patient Details Name:  KEMANI DEMARAIS MRN: [id number] Date of Birth: Nov 05, 1940 Today's Date: 04/16/2023 HPI/PMH: HPI: DAEQUAN KOZMA is a 83 y.o. male who, per chart review, was noted by wife at home to have easy fatigability, and dyspnea on exertion, to the point where he did not wanted to get out of the bed any more. His po intake had significantly decreased over the last 48 hrs. CXR 4/3 suggestive of CHF or pulmonary edema.  Head CT 4/3 without acute findings.  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 Clinical Impression: Clinical Impression: Pt has a mild oral dysphagia with relatively functional pharyngeal phase. He has reduced lingual hold and bolus cohesion, with repetitive lingual motion espcially with purees (with liquids, it is less repetitive but sometimes slow or delayed). Mastication is prolonged but effective. There is a collection of oral residue that he will often self-manage with a spontaneous second swallow, although especially with thin liquids this can spill into the valleculae first. His swallow initiation occurs promptly though with good timing, pharyngeal efficiency, and airway protection. No aspiration or penetration is observed (cough x1 during MBS not associated with aspiration). The barium tablet cleared his oral cavity and pharynx well but did appear to get stopped in his esophagus, moving a little further down with dry swallows. Recommend that pt continue with mechanical soft diet but will adjust liquids to be thin. Factors that may increase risk of adverse event in presence of aspiration Rubye Oaks & Clearance Coots 2021): Factors that may increase risk of adverse event in presence of aspiration Rubye Oaks & Clearance Coots 2021): Poor general health and/or compromised immunity; Reduced cognitive function  Recommendations/Plan: Swallowing Evaluation Recommendations Swallowing Evaluation Recommendations Recommendations: PO diet PO Diet Recommendation: Dysphagia 3 (Mechanical soft); Thin liquids (Level 0) Liquid Administration via: Cup; Straw Medication Administration: Whole meds with liquid (may want to crush larger pills) Supervision: Staff to assist with self-feeding Swallowing strategies  : Slow rate; Small bites/sips; Follow solids with liquids Postural changes: Position pt fully upright for meals; Stay upright 30-60 min after meals Oral care recommendations: Oral care BID (2x/day) Treatment Plan Treatment Plan Treatment recommendations: Therapy as outlined in treatment plan below Follow-up recommendations: No SLP follow up  Functional status assessment: Patient has had a recent decline in their functional status and demonstrates the ability to make significant improvements in function in a reasonable and predictable amount of time. Treatment frequency: Min 2x/week Treatment duration: 1 week Interventions: Aspiration precaution training; Patient/family education; Diet toleration management by SLP; Trials of upgraded texture/liquids Recommendations Recommendations for follow up therapy are one component of a multi-disciplinary discharge planning process, led by the attending physician.  Recommendations may be updated based on patient status, additional functional criteria and insurance authorization. Assessment: Orofacial Exam: No data recorded Anatomy: Anatomy: WFL Boluses Administered: Boluses Administered Boluses Administered: Thin liquids (Level 0); Mildly thick liquids (Level 2, nectar thick); Moderately thick liquids (Level 3, honey thick); Puree; Solid  Oral Impairment Domain: Oral Impairment Domain Lip Closure: No labial escape Tongue control during bolus hold: Posterior escape of less than half of bolus Bolus preparation/mastication: Timely and efficient chewing and mashing Bolus transport/lingual motion:  Repetitive/disorganized tongue motion Oral residue: Residue collection on oral structures Location of oral residue : Floor of mouth; Tongue (spills to valleculae) Initiation of pharyngeal swallow : Valleculae  Pharyngeal Impairment Domain: Pharyngeal Impairment Domain Soft palate elevation: No bolus between soft palate (SP)/pharyngeal wall (PW) Laryngeal elevation: Complete superior movement of thyroid cartilage with complete approximation of arytenoids to epiglottic petiole Anterior hyoid excursion: Complete anterior movement Epiglottic movement: Complete inversion Laryngeal vestibule closure: Complete, no air/contrast in laryngeal vestibule Pharyngeal stripping wave : Present - complete Pharyngeal contraction (A/P view only): N/A Pharyngoesophageal segment opening: Complete distension and complete duration, no obstruction of flow Tongue base retraction: No contrast between tongue base and posterior pharyngeal wall (PPW) Pharyngeal residue: Complete pharyngeal clearance Location of pharyngeal residue: N/A  Esophageal Impairment Domain: Esophageal Impairment Domain Esophageal clearance upright position: Esophageal retention Pill: Pill Consistency administered: Thin liquids (Level 0) Thin liquids (Level 0): Impaired (see clinical impressions) Penetration/Aspiration Scale Score: Penetration/Aspiration Scale Score 1.  Material does not enter airway: Thin liquids (Level 0); Mildly thick liquids (Level 2, nectar thick); Moderately thick liquids (Level 3, honey thick); Puree; Solid; Pill Compensatory Strategies: No data recorded  General Information: Caregiver present: No  Diet Prior to this Study: Dysphagia 3 (mechanical soft); Mildly thick liquids (Level 2, nectar thick)   Temperature : Normal   Respiratory Status: WFL   Supplemental O2: None (Room air)   History of Recent Intubation: No  Behavior/Cognition: Alert; Cooperative; Requires cueing Self-Feeding Abilities: Needs assist with self-feeding Baseline vocal  quality/speech: Normal No data recorded Volitional Swallow: Able to elicit Exam Limitations: No limitations Goal Planning: Prognosis for improved oropharyngeal function: Fair Barriers to Reach Goals: Cognitive deficits No data recorded Patient/Family Stated Goal: unable to state, no family present Consulted and agree with results and recommendations: Patient; Physician; Nurse Pain: Pain Assessment Pain Assessment: Faces Faces Pain Scale: 0 Breathing: 1 Negative Vocalization: 1 Facial Expression: 2 Body Language: 1 Consolability: 1 PAINAD Score: 6 End of Session: Start Time:SLP Start Time (ACUTE ONLY): 0933 Stop Time: SLP Stop Time (ACUTE ONLY): 0947 Time Calculation:SLP Time Calculation (min) (ACUTE ONLY): 14 min Charges: SLP Evaluations $ SLP Speech Visit: 1 Visit SLP Evaluations $MBS Swallow: 1 Procedure SLP visit diagnosis: SLP Visit Diagnosis: Dysphagia, oral phase (R13.11) Past Medical History: Past Medical History: Diagnosis Date  Adenomatous colon polyp   Allergic rhinitis   Anemia   Aortic stenosis 07/30/2019  mild to moderate AS (AVA VTI 1.37 cm, AV mean gradient 15.0 mmHg, AV Vmax 2.56 m/s)  BMI 40.0-44.9, adult (HCC)   Chronic insomnia  Chronic low back pain   Constipation, unspecified   Diabetes mellitus (HCC) 2015  DJD (degenerative joint disease) of knee   Hypercalcemia   Hypertension   Hypertriglyceridemia   Junctional tachycardia (HCC)   Lumbar facet arthropathy   Morbid obesity (HCC)   Neuropathy   Nonallergic rhinitis   OSA (obstructive sleep apnea)   wife denies  Other chronic pain   PAF (paroxysmal atrial fibrillation) (HCC) 01/2020  Peripheral axonal neuropathy   Upper GI bleed   due to aspirin  Venous insufficiency   Venous stasis dermatitis of both lower extremities  Past Surgical History: Past Surgical History: Procedure Laterality Date  AMPUTATION Right 08/30/2021  Procedure: Right index finger Amputaion;  Surgeon: Marlyne Beards, MD;  Location: [facility] OR;  Service: Orthopedics;   Laterality: Right;  AMPUTATION Left 03/16/2023  Procedure: AMPUTATION, FOOT, RAY;  Surgeon: Nadara Mustard, MD;  Location: [facility] OR;  Service: Orthopedics;  Laterality: Left;  LEFT FOOT 5TH RAY AMPUTATION 28810  cataract    Bilateral  CORNEAL TRANSPLANT Bilateral   GLAUCOMA SURGERY    HAND SURGERY Left   s/p amputation of left finger  I & D EXTREMITY Right 04/10/2020  Procedure: IRRIGATION AND DEBRIDEMENT AND AMPUTATION OF RIGHT RING FINGER;  Surgeon: Bradly Bienenstock, MD;  Location: [facility] OR;  Service: Orthopedics;  Laterality: Right; Mahala Menghini., M.A. CCC-SLP Acute Rehabilitation Services Office [phone] Secure chat preferred 04/16/2023, 10:57 AM    Scheduled Meds:  sodium chloride   Intravenous Once   sodium chloride   Intravenous Once   apixaban  5 mg Oral BID   carvedilol  12.5 mg Oral BID WC   feeding supplement  237 mL Oral BID BM   insulin aspart  0-15 Units Subcutaneous TID WC   insulin glargine-yfgn  10 Units Subcutaneous Daily   multivitamin with minerals  1 tablet Oral Daily   pantoprazole  40 mg Oral Daily   prednisoLONE acetate  1 drop Left Eye TID   thiamine  500 mg Oral Daily   Continuous Infusions:  ceFEPime (MAXIPIME) IV Stopped (04/17/23 1226)   iron sucrose     [START ON 04/18/2023] vancomycin       LOS: 5 days    Time spent:    Zannie Cove, MD [facility]   04/17/2023, 6:30 PM

## 2023-04-17 NOTE — Progress Notes (Signed)
 Heart Failure Navigator Progress Note  Assessed for Heart & Vascular TOC clinic readiness.  Patient does not meet criteria per Dr. Ella Jubilee.  Sent Living with Heart Failure Booklet to patient room per palliative care.   Navigator available for reassessment of patient.   Sharen Hones, PharmD, BCPS Heart Failure Stewardship Pharmacist Phone [phone]

## 2023-04-17 NOTE — Evaluation (Signed)
 Occupational Therapy Evaluation Patient Details Name: [name] MRN: [id number] DOB: 08/22/1940 Today's Date: 04/17/2023   History of Present Illness   [name] is a 83 y.o. male admitted for weakness, poor appetite, and melena. 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     Clinical Impressions Patient admitted for the diagnosis above.  PTA patient was living at home with decreasing functional status and increasing assist for ADL and mobility at home.  Patient presents with deficits listed below, difficulty initiating and sustaining any task currently.  Near total A for all mobility and ADL completion.  OT can attempt rehab trial to see what his response is, and to assist with any DME needed, should home be the destination.  Patient will benefit from continued inpatient follow up therapy, <3 hours/day.     If plan is discharge home, recommend the following:   Assistance with feeding;A lot of help with bathing/dressing/bathroom;Two people to help with walking and/or transfers     Functional Status Assessment   Patient has had a recent decline in their functional status and demonstrates the ability to make significant improvements in function in a reasonable and predictable amount of time.     Equipment Recommendations   [employer];Hospital bed     Recommendations for Other Services         Precautions/Restrictions   Precautions Precautions: Fall Precaution/Restrictions Comments: recent L 5th toe amp Restrictions Weight Bearing Restrictions Per Provider Order: No     Mobility Bed Mobility Overal bed mobility: Needs Assistance Bed Mobility: Supine to Sit, Sit to Supine     Supine to sit: Max assist, +2 for physical assistance, HOB elevated Sit to supine: Max assist, +2 for safety/equipment, HOB  elevated        Transfers Overall transfer level: Needs assistance Equipment used: 2 person hand held assist Transfers: Sit to/from Stand Sit to Stand: +2 physical assistance, Total assist                  Balance Overall balance assessment: Needs assistance Sitting-balance support: Feet supported, Bilateral upper extremity supported Sitting balance-Leahy Scale: Poor   Postural control: Posterior lean, Left lateral lean Standing balance support: Bilateral upper extremity supported, During functional activity, Reliant on assistive device for balance Standing balance-Leahy Scale: Zero                             ADL either performed or assessed with clinical judgement   ADL Overall ADL's : Needs assistance/impaired Eating/Feeding: Total assistance;Bed level   Grooming: Wash/dry hands;Wash/dry face;Total assistance;Bed level   Upper Body Bathing: Total assistance   Lower Body Bathing: Total assistance   Upper Body Dressing : Total assistance   Lower Body Dressing: Total assistance       Toileting- Clothing Manipulation and Hygiene: Total assistance;Bed level               Vision   Vision Assessment?: No apparent visual deficits     Perception Perception: Not tested       Praxis Praxis: Not tested       Pertinent Vitals/Pain Pain Assessment Pain Assessment: Faces Faces Pain Scale: No hurt Pain Intervention(s): Monitored during session     Extremity/Trunk Assessment Upper Extremity Assessment Upper Extremity Assessment: Generalized weakness   Lower Extremity Assessment Lower Extremity Assessment: Defer  to PT evaluation   Cervical / Trunk Assessment Cervical / Trunk Assessment: Kyphotic   Communication Communication Communication: Impaired Factors Affecting Communication: Hearing impaired;Difficulty expressing self;Reduced clarity of speech   Cognition Arousal: Alert Behavior During Therapy: Flat affect Cognition: Cognition  impaired   Orientation impairments: Place, Time, Situation Awareness: Intellectual awareness impaired, Online awareness impaired Memory impairment (select all impairments): Short-term memory, Working memory Attention impairment (select first level of impairment): Focused attention Executive functioning impairment (select all impairments): Initiation, Organization, Sequencing, Reasoning, Problem solving                   Following commands: Impaired Following commands impaired: Follows one step commands inconsistently     Cueing  General Comments   Cueing Techniques: Verbal cues;Tactile cues  pt with bilat feet wrapped in dressings   Exercises     Shoulder Instructions      Home Living Family/patient expects to be discharged to:: Private residence Living Arrangements: Spouse/significant other Available Help at Discharge: Family;Available 24 hours/day Type of Home: House Home Access: Ramped entrance     Home Layout: One level     Bathroom Shower/Tub: Chief Strategy Officer: Handicapped height Bathroom Accessibility: Yes   Home Equipment: Wheelchair - Forensic psychologist (2 wheels)          Prior Functioning/Environment Prior Level of Function : Needs assist             Mobility Comments: heavy assist for bed mobility and assist in/out of w/c until recently when pt couldn't even sit up ADLs Comments: wife sponge bathes him in the bed    OT Problem List: Decreased strength;Decreased activity tolerance;Impaired balance (sitting and/or standing);Decreased safety awareness;Decreased cognition   OT Treatment/Interventions: Self-care/ADL training;Therapeutic exercise;Therapeutic activities;Patient/family education;Balance training      OT Goals(Current goals can be found in the care plan section)   Acute Rehab OT Goals OT Goal Formulation: Patient unable to participate in goal setting Time For Goal Achievement: 05/01/23 Potential to  Achieve Goals: Fair ADL Goals Pt Will Perform Eating: with min assist;sitting Pt Will Perform Grooming: with min assist;sitting Pt Will Perform Upper Body Bathing: with mod assist;sitting Pt Will Perform Upper Body Dressing: with mod assist;sitting Pt Will Transfer to Toilet: with min assist;with +2 assist;bedside commode;stand pivot transfer Pt/caregiver will Perform Home Exercise Program: Increased strength;Both right and left upper extremity;With minimal assist   OT Frequency:  Min 2X/week    Co-evaluation PT/OT/SLP Co-Evaluation/Treatment: Yes Reason for Co-Treatment: Complexity of the patient's impairments (multi-system involvement) PT goals addressed during session: Mobility/safety with mobility OT goals addressed during session: ADL's and self-care      AM-PAC OT "6 Clicks" Daily Activity     Outcome Measure Help from another person eating meals?: A Lot Help from another person taking care of personal grooming?: Total Help from another person toileting, which includes using toliet, bedpan, or urinal?: Total Help from another person bathing (including washing, rinsing, drying)?: Total Help from another person to put on and taking off regular upper body clothing?: Total Help from another person to put on and taking off regular lower body clothing?: Total 6 Click Score: 7   End of Session Equipment Utilized During Treatment: Gait belt Nurse Communication: Mobility status  Activity Tolerance: Patient limited by lethargy Patient left: in bed;with call bell/phone within reach;with family/visitor present  OT Visit Diagnosis: Muscle weakness (generalized) (M62.81);Unsteadiness on feet (R26.81);Other symptoms and signs involving cognitive function  Time: 6962-9528 OT Time Calculation (min): 22 min Charges:  OT General Charges $OT Visit: 1 Visit OT Evaluation $OT Eval Moderate Complexity: 1 Mod  04/17/2023  RP, OTR/L  Acute Rehabilitation Services  Office:   [phone]   Suzanna Obey 04/17/2023, 12:40 PM

## 2023-04-18 ENCOUNTER — Encounter: Admitting: Family

## 2023-04-18 DIAGNOSIS — D649 Anemia, unspecified: Secondary | ICD-10-CM | POA: Diagnosis not present

## 2023-04-18 LAB — CBC
HCT: 31.6 % — ABNORMAL LOW (ref 39.0–52.0)
Hemoglobin: 9.1 g/dL — ABNORMAL LOW (ref 13.0–17.0)
MCH: 24.9 pg — ABNORMAL LOW (ref 26.0–34.0)
MCHC: 28.8 g/dL — ABNORMAL LOW (ref 30.0–36.0)
MCV: 86.6 fL (ref 80.0–100.0)
Platelets: 161 10*3/uL (ref 150–400)
RBC: 3.65 MIL/uL — ABNORMAL LOW (ref 4.22–5.81)
RDW: 18.9 % — ABNORMAL HIGH (ref 11.5–15.5)
WBC: 12.8 10*3/uL — ABNORMAL HIGH (ref 4.0–10.5)
nRBC: 0.2 % (ref 0.0–0.2)

## 2023-04-18 LAB — GLUCOSE, CAPILLARY
Glucose-Capillary: 175 mg/dL — ABNORMAL HIGH (ref 70–99)
Glucose-Capillary: 217 mg/dL — ABNORMAL HIGH (ref 70–99)
Glucose-Capillary: 256 mg/dL — ABNORMAL HIGH (ref 70–99)
Glucose-Capillary: 191 mg/dL — ABNORMAL HIGH (ref 70–99)

## 2023-04-18 LAB — BASIC METABOLIC PANEL WITH GFR
Anion gap: 10 (ref 5–15)
BUN: 22 mg/dL (ref 8–23)
CO2: 29 mmol/L (ref 22–32)
Calcium: 8.6 mg/dL — ABNORMAL LOW (ref 8.9–10.3)
Chloride: 99 mmol/L (ref 98–111)
Creatinine, Ser: 1.14 mg/dL (ref 0.61–1.24)
GFR, Estimated: >60 mL/min (ref >60)
Glucose, Bld: 210 mg/dL — ABNORMAL HIGH (ref 70–99)
Potassium: 4.3 mmol/L (ref 3.5–5.1)
Sodium: 138 mmol/L (ref 135–145)

## 2023-04-18 MED ORDER — VANCOMYCIN HCL IN DEXTROSE 1-5 GM/200ML-% IV SOLN: 1000.0000 mg | INTRAVENOUS | Status: DC

## 2023-04-18 MED ORDER — INSULIN ASPART 100 UNIT/ML IJ SOLN
3.0000 [IU] | Freq: Three times a day (TID) | INTRAMUSCULAR | Status: DC
  Administered 2023-04-18 – 2023-04-19 (×3): 3 [IU] via SUBCUTANEOUS

## 2023-04-18 MED ORDER — ORAL CARE MOUTH RINSE: 15.0000 mL | OROMUCOSAL | Status: DC | PRN

## 2023-04-18 MED ORDER — ORAL CARE MOUTH RINSE
15.0000 mL | OROMUCOSAL | Status: DC
  Administered 2023-04-18 – 2023-04-22 (×15): 15 mL via OROMUCOSAL

## 2023-04-18 MED ORDER — HYDROMORPHONE HCL 1 MG/ML IJ SOLN
0.5000 mg | INTRAMUSCULAR | Status: DC | PRN
  Administered 2023-04-19 – 2023-04-22 (×2): 0.5 mg via INTRAVENOUS
  Filled 2023-04-18 (×4): qty 0.5

## 2023-04-18 NOTE — Progress Notes (Signed)
 Pharmacy Antibiotic Note  [name] is a 83 y.o. male admitted on 04/12/2023 with  wound infection .  Pharmacy has been consulted for vancomycin and cefepime dosing. Renal function improving, will adjust vancomycin frequency and dose.  Plan: Cefepime 2 grams IV every 12 hours Vancomycin 1000 mg IV Q24h (eAUC 449, goal AUC 400-550, Scr 1.14, Vd 0.5) Trend WBC, fever, renal function F/u cultures, clinical progress, levels as indicated De-escalate when able  Height: 5\' 9"  (175.3 cm) Weight: 100.2 kg (220 lb 14.4 oz) IBW/kg (Calculated) : 70.7  Temp (24hrs), Avg:97.8 F (36.6 C), Min:96.6 F (35.9 C), Max:98.4 F (36.9 C)  Recent Labs  Lab 04/12/23 0918 04/13/23 0402 04/13/23 2259 04/14/23 0814 04/14/23 1647 04/15/23 0313 04/16/23 0346 04/17/23 0321 04/18/23 0312  WBC  --    < >  --  11.4*  --  11.4* 13.3* 12.3* 12.8*  CREATININE  --    < >  --  1.72* 1.67* 1.30* 1.11 1.13 1.14  LATICACIDVEN 8.6*  --  1.7  --   --   --   --   --   --    < > = values in this interval not displayed.    Estimated Creatinine Clearance: 56.3 mL/min (by C-G formula based on SCr of 1.14 mg/dL).    Allergies  Allergen Reactions   Ambien [Zolpidem Tartrate] Other (See Comments)    Confusion Memory loss   Aspirin Other (See Comments)    Bleeding ulcer   Cosopt [Dorzolamide Hcl-Timolol Mal] Other (See Comments)    Red and burning eyes   Diamox [Acetazolamide] Other (See Comments)    Unknown reaction    Antimicrobials this admission: 4/7 Vanc >>  4/7 Cefepime >>   Microbiology results: 4/3 BCx: ng x 4d Thank you for allowing pharmacy to be a part of this patient's care.  Thelma Barge, PharmD, BCPS Clinical Pharmacist

## 2023-04-18 NOTE — Progress Notes (Signed)
 Palliative Medicine Inpatient Follow Up Note HPI: 83 y.o. 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 Has had worsening dyspnea related to eart failure decompensation.   Palliative case has been asked to get involved to support additional goals of care conversations.   Today's Discussion 04/18/2023  *Please note that this is a verbal dictation therefore any spelling or grammatical errors are due to the "[employer]agon [employer] One" system interpretation.  Chart reviewed inclusive of vital signs, progress notes, laboratory results, and diagnostic images.   I met with [name] at bedside this afternoon in the company of his spouse, [name]. [name] is able to follow directions though is slow to do. He is pleasantly confused this afternoon and not noted to be in any distress. He denies pain.   I spoke with [name] regarding steps forward in the setting of [name]'s complicated delirium, systolic heart failure, Afib, and CKD. She shares again that she has not seen [name] in such poor health to date. She does note that he recognized her today.  We again discussed the options from here one being for him to go to rehabilitation to see if he may be able to gain strength. We discussed if he is unable to do or tolerate this the idea of him going home with hospice support.   [name] is hopeful to speak to the primary team to gain better insights on what they feel the next best step may be. I shared that I would request Dr. Jomarie Longs reach out.  I allowed time and space as [name]'s current condition if clearly difficult to see for his wife.   Questions and concerns addressed/Palliative Support Provided.   Objective Assessment: Vital Signs Vitals:   04/18/23 1300 04/18/23 1338  BP:    Pulse: (!) 110   Resp: (!) 25 (!) 25   Temp:    SpO2: 97%     Intake/Output Summary (Last 24 hours) at 04/18/2023 1348 Last data filed at 04/18/2023 1100 Gross per 24 hour  Intake 1018.62 ml  Output 100 ml  Net 918.62 ml   Last Weight  Most recent update: 04/16/2023  3:52 AM    Weight  100.2 kg (220 lb 14.4 oz)            Gen: Elderly Caucasian male chronically ill in appearance HEENT: moist mucous membranes CV: Irregular rate and rhythm  PULM: On room air breathing is even and nonlabored ABD: soft/nontender  EXT: (+) L toe/foot wound wrapping in place Neuro: Disoriented  SUMMARY OF RECOMMENDATIONS   DNAR/DNI   Living Will obtained   Ongoing discussions with patients wife regarding if he should go to SNF or home with hospice --> Given patients delirium the concern is SF may cause further cognitive decline. At [facility] patient is WC bound   Institute strict delirium precautions  Appreciate [facility] involvement regarding the option of OP Palliative support  Plan for ongoing palliative care support --> I will not be present tomorrow though I will request a colleague follow up  Billing based on MDM: High ______________________________________________________________________________________ Lamarr Lulas [place] Palliative Medicine Team Team Cell Phone: [phone] Please utilize secure chat with additional questions, if there is no response within 30 minutes please call the above phone number  Palliative Medicine Team providers are available by phone from 7am to 7pm daily and can be reached through  the team cell phone.  Should this patient require assistance outside of these hours, please call the patient's attending physician.

## 2023-04-18 NOTE — Consult Note (Signed)
 WOC Nurse Consult Note: Reason for Consult:Left fifth ray amputation.  Dr Lajoyce Corners saw on 4/8, noting eschar over wound bed.  Orders as follows  Wound type: surgical Pressure Injury POA: NA Measurement: 4 cm x 7 cm eschar to wound bed Wound ZOX:WRUEAV Drainage (amount, consistency, odor) minimal serosanguinous  no odor Periwound:intact Dressing procedure/placement/frequency:PEr Dr Lajoyce Corners:  Patient is 1 month status post left foot fifth ray amputation. Patient has a strong palpable dorsalis pedis pulse however patient has developed a necrotic eschar over the ray amputation. Patient's problem is primarily microcirculatory in nature. Would continue cleansing with soap and water and dry dressing changes daily. No surgical intervention indicated at this time.  Will not follow at this time.  Please re-consult if needed.  Mike Gip MSN, RN, FNP-BC CWON Wound, Ostomy, Continence Nurse Outpatient [facility] [phone] Pager [phone]

## 2023-04-18 NOTE — Plan of Care (Signed)
  Problem: Education: Goal: Knowledge of General Education information will improve Description: Including pain rating scale, medication(s)/side effects and non-pharmacologic comfort measures Outcome: Not Progressing   Problem: Clinical Measurements: Goal: Ability to maintain clinical measurements within normal limits will improve Outcome: Progressing Goal: Will remain free from infection Outcome: Progressing Goal: Diagnostic test results will improve Outcome: Progressing Goal: Respiratory complications will improve Outcome: Progressing Goal: Cardiovascular complication will be avoided Outcome: Progressing   Problem: Activity: Goal: Risk for activity intolerance will decrease Outcome: Progressing   Problem: Nutrition: Goal: Adequate nutrition will be maintained Outcome: Progressing

## 2023-04-19 DIAGNOSIS — D649 Anemia, unspecified: Secondary | ICD-10-CM | POA: Diagnosis not present

## 2023-04-19 LAB — CBC
HCT: 32.5 % — ABNORMAL LOW (ref 39.0–52.0)
Hemoglobin: 9.3 g/dL — ABNORMAL LOW (ref 13.0–17.0)
MCH: 24.8 pg — ABNORMAL LOW (ref 26.0–34.0)
MCHC: 28.6 g/dL — ABNORMAL LOW (ref 30.0–36.0)
MCV: 86.7 fL (ref 80.0–100.0)
Platelets: 175 10*3/uL (ref 150–400)
RBC: 3.75 MIL/uL — ABNORMAL LOW (ref 4.22–5.81)
RDW: 19.6 % — ABNORMAL HIGH (ref 11.5–15.5)
WBC: 14.4 10*3/uL — ABNORMAL HIGH (ref 4.0–10.5)
nRBC: 0.1 % (ref 0.0–0.2)

## 2023-04-19 LAB — BASIC METABOLIC PANEL WITH GFR
Anion gap: 11 (ref 5–15)
BUN: 21 mg/dL (ref 8–23)
CO2: 27 mmol/L (ref 22–32)
Calcium: 9 mg/dL (ref 8.9–10.3)
Chloride: 100 mmol/L (ref 98–111)
Creatinine, Ser: 1.26 mg/dL — ABNORMAL HIGH (ref 0.61–1.24)
GFR, Estimated: 56 mL/min — ABNORMAL LOW (ref >60)
Glucose, Bld: 202 mg/dL — ABNORMAL HIGH (ref 70–99)
Potassium: 4 mmol/L (ref 3.5–5.1)
Sodium: 138 mmol/L (ref 135–145)

## 2023-04-19 LAB — GLUCOSE, CAPILLARY
Glucose-Capillary: 194 mg/dL — ABNORMAL HIGH (ref 70–99)
Glucose-Capillary: 223 mg/dL — ABNORMAL HIGH (ref 70–99)
Glucose-Capillary: 318 mg/dL — ABNORMAL HIGH (ref 70–99)

## 2023-04-19 MED ORDER — INSULIN GLARGINE-YFGN 100 UNIT/ML ~~LOC~~ SOLN
15.0000 [IU] | Freq: Every day | SUBCUTANEOUS | Status: DC
  Administered 2023-04-19: 15 [IU] via SUBCUTANEOUS
  Filled 2023-04-19 (×2): qty 0.15

## 2023-04-19 MED ORDER — DIGOXIN 0.25 MG/ML IJ SOLN
0.1250 mg | Freq: Two times a day (BID) | INTRAMUSCULAR | Status: AC
  Administered 2023-04-19 (×2): 0.125 mg via INTRAVENOUS
  Filled 2023-04-19 (×2): qty 0.5

## 2023-04-19 MED ORDER — LORAZEPAM 1 MG PO TABS
1.0000 mg | ORAL_TABLET | ORAL | Status: DC | PRN
  Administered 2023-04-20 – 2023-04-21 (×3): 1 mg via ORAL
  Filled 2023-04-19 (×3): qty 1

## 2023-04-19 MED ORDER — BIOTENE DRY MOUTH MT LIQD: 15.0000 mL | OROMUCOSAL | Status: DC | PRN

## 2023-04-19 MED ORDER — POLYVINYL ALCOHOL 1.4 % OP SOLN: 1.0000 [drp] | Freq: Four times a day (QID) | OPHTHALMIC | Status: DC | PRN

## 2023-04-19 MED ORDER — LORAZEPAM 2 MG/ML PO CONC
1.0000 mg | ORAL | Status: DC | PRN
  Administered 2023-04-21: 1 mg via SUBLINGUAL
  Filled 2023-04-19: qty 1

## 2023-04-19 MED ORDER — GLYCOPYRROLATE 0.2 MG/ML IJ SOLN
0.2000 mg | INTRAMUSCULAR | Status: DC | PRN
  Administered 2023-04-22: 0.2 mg via INTRAVENOUS
  Filled 2023-04-19 (×2): qty 1

## 2023-04-19 MED ORDER — GLYCOPYRROLATE 0.2 MG/ML IJ SOLN
0.2000 mg | INTRAMUSCULAR | Status: DC | PRN
  Administered 2023-04-23 (×2): 0.2 mg via SUBCUTANEOUS
  Filled 2023-04-19 (×2): qty 1

## 2023-04-19 MED ORDER — ONDANSETRON HCL 4 MG/2ML IJ SOLN: 4.0000 mg | Freq: Four times a day (QID) | INTRAMUSCULAR | Status: DC | PRN

## 2023-04-19 MED ORDER — HALOPERIDOL LACTATE 2 MG/ML PO CONC
0.5000 mg | ORAL | Status: DC | PRN
  Administered 2023-04-22: 0.5 mg via SUBLINGUAL
  Filled 2023-04-19: qty 5

## 2023-04-19 MED ORDER — CARVEDILOL 25 MG PO TABS
25.0000 mg | ORAL_TABLET | Freq: Two times a day (BID) | ORAL | Status: DC
  Administered 2023-04-19 – 2023-04-20 (×4): 25 mg via ORAL
  Filled 2023-04-19 (×5): qty 1

## 2023-04-19 MED ORDER — HALOPERIDOL 0.5 MG PO TABS: 0.5000 mg | ORAL_TABLET | ORAL | Status: DC | PRN

## 2023-04-19 MED ORDER — GLYCOPYRROLATE 1 MG PO TABS: 1.0000 mg | ORAL_TABLET | ORAL | Status: DC | PRN

## 2023-04-19 MED ORDER — ONDANSETRON 4 MG PO TBDP: 4.0000 mg | ORAL_TABLET | Freq: Four times a day (QID) | ORAL | Status: DC | PRN

## 2023-04-19 MED ORDER — HALOPERIDOL LACTATE 5 MG/ML IJ SOLN: 0.5000 mg | INTRAMUSCULAR | Status: DC | PRN

## 2023-04-19 MED ORDER — SODIUM CHLORIDE 0.9 % IV BOLUS
250.0000 mL | Freq: Once | INTRAVENOUS | Status: AC
  Administered 2023-04-19: 250 mL via INTRAVENOUS

## 2023-04-19 MED ORDER — LORAZEPAM 2 MG/ML IJ SOLN
1.0000 mg | INTRAMUSCULAR | Status: DC | PRN
  Administered 2023-04-22: 1 mg via INTRAVENOUS
  Filled 2023-04-19: qty 1

## 2023-04-19 MED ORDER — OXYCODONE HCL 5 MG PO TABS
5.0000 mg | ORAL_TABLET | Freq: Four times a day (QID) | ORAL | Status: DC
  Administered 2023-04-19 – 2023-04-23 (×16): 5 mg via ORAL
  Filled 2023-04-19 (×16): qty 1

## 2023-04-19 NOTE — Progress Notes (Signed)
 OT Cancellation Note  Patient Details Name: [name] MRN: [id number] DOB: 1940-05-10   Cancelled Treatment:    Reason Eval/Treat Not Completed: Medical issues which prohibited therapy.  Restless with soft BP and high HR.  Will check back later for appropriateness.    Wheeler Incorvaia D Samie Reasons 04/19/2023, 10:54 AM 04/19/2023  RP, OTR/L  Acute Rehabilitation Services  Office:  [phone]

## 2023-04-19 NOTE — Progress Notes (Addendum)
 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  He noted easy fatigability, and dyspnea on exertion, -In ED, VSS, Left foot surgical wound with necrotic eschar and some signs of active bleeding, small amount of drainage. Labs -bun 55 cr 1,91 AST 686 ALT 577, BNP 1,901 Lactic acid 8.6, Wbc 14.8 hgb 4.1 (from 9,1 on 03/16/23), plt 395 Fecal occult test negative   CXR w/ vascular congestion, bilateral central interstitial infiltrates, with bilateral pleural effusion  -Left foot radiograph with sp proximal transmetatarsal amputation of the fifth toe. no radiographic evidence of acute osteomyelitis  -4/03 2 units PRBC transfusion. placed on IV furosemide for diuresis.  -4/04 hgb 7.6. for 2 more units PRBC transfusion, volume status has improved.   -4/05 significant agitation, has developed atrial fibrillation with rapid ventricular response. Echo with reduced EF, aortic stenosis, MR -4/06  resumed apixaban.  -4/ 07 palliative consulted -4/08 weak, confused, seen by Dr. Lajoyce Corners, recommended wound care  Subjective: A-fib RVR, remains confused  Assessment and Plan:  Iron defi anemia -hemoccult neg -SP 4 units PRBC transfusion.  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 -Echo w/ EF 30 to 35%, global hypokinesis, RV systolic function preserved, severe mitral valve regurgitation, moderate mitral valve stenosis, moderate aortic valve stenosis,  -Continue carvedilol -GDMT limited by hypotension -Not appropriate for ischemic eval with confusion, encephalopathy -Diuretics on hold at this time,  prognosis is poor -I worry he needs hospice, called and updated wife [name]  Acute metabolic encephalopathy Multifactorial encephalopathy-likely secondary to necrotic wound, infection, ongoing intermittent agitation throughout this admission --dysphagia 3 with aspiration precautions.  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 Lajoyce Corners 4/8, dorsalis pedis strong pulse however felt to be primarily microcirculatory disease, recommended wound care -Treated with 6 days of IV cefepime and vancomycin -DC cefepime 4/9 with ongoing encephalopathy  PAF (paroxysmal atrial fibrillation) (HCC) Patient with atrial fibrillation and atrial flutter with rapid ventricular response.  -continue carvedilol,, dose increased, add digoxin Anticoagulation with apixaban  Chronic kidney disease, stage 3a (HCC) AKI, Hyponatremia, Lactic acidosis. Hypokalemia.  monitor  Type 2 diabetes mellitus with obesity (HCC) Hyperglycemia.  -basal insulin 10 units , ssi  Obesity, class 1 Calculated BMI is 33.9    DVT prophylaxis: apixaban Code Status: DNR Family Communication: No family at bedside, called and updated spouse yesterday, will attempt again today Disposition Plan: To be determined  Consultants:    Procedures:   Antimicrobials:    Objective: Vitals:   04/19/23 1020 04/19/23 1100 04/19/23 1118 04/19/23 1215  BP: (!) 88/63  (!) 85/59 (!) 104/57  Pulse: (!) 129 (!) 128 (!) 130 (!) 112  Resp: 20 (!) 21 (!) 23 (!) 25  Temp:   99.3 F (37.4 C)   TempSrc:   Oral   SpO2: 95% 93% 95% 96%  Weight:      Height:        Intake/Output Summary (Last 24 hours) at 04/19/2023  1219 Last data filed at 04/19/2023 1116 Gross per 24 hour  Intake 660 ml  Output 1000 ml  Net -340 ml   Filed Weights   04/13/23 0401 04/15/23 0300 04/16/23  0245  Weight: 100.5 kg 98.9 kg 100.2 kg    Examination:  Chronically ill male laying in bed, confused HEENT: No JVD CVS: S1-S2, irregular rhythm, tachycardic, systolic murmur Lungs: Decreased breath sounds to bases Abdomen: Soft, nontender, bowel sounds present EXTR midis: Trace edema   Data Reviewed:   CBC: Recent Labs  Lab 04/15/23 0313 04/16/23 0346 04/17/23 0321 04/18/23 0312 04/19/23 0408  WBC 11.4* 13.3* 12.3* 12.8* 14.4*  HGB 8.9* 9.1* 9.7* 9.1* 9.3*  HCT 29.2* 30.6* 33.1* 31.6* 32.5*  MCV 82.3 84.5 86.0 86.6 86.7  PLT 223 225 211 161 175   Basic Metabolic Panel: Recent Labs  Lab 04/14/23 0814 04/14/23 1647 04/15/23 0313 04/16/23 0346 04/17/23 0321 04/18/23 0312 04/19/23 0408  NA 141   < > 136 136 136 138 138  K 2.6*   < > 3.2* 3.8 4.0 4.3 4.0  CL 93*   < > 94* 95* 96* 99 100  CO2 33*   < > 29 27 26 29 27   GLUCOSE 225*   < > 225* 223* 186* 210* 202*  BUN 40*   < > 31* 25* 20 22 21   CREATININE 1.72*   < > 1.30* 1.11 1.13 1.14 1.26*  CALCIUM 8.6*   < > 8.4* 8.7* 8.7* 8.6* 9.0  MG 1.8  --   --  2.2 2.1  --   --    < > = values in this interval not displayed.   GFR: Estimated Creatinine Clearance: 50.9 mL/min (A) (by C-G formula based on SCr of 1.26 mg/dL (H)). Liver Function Tests: Recent Labs  Lab 04/14/23 0814  AST 175*  ALT 327*  ALKPHOS 88  BILITOT 4.4*  PROT 6.9  ALBUMIN 3.0*   No results for input(s): "LIPASE", "AMYLASE" in the last 168 hours. No results for input(s): "AMMONIA" in the last 168 hours. Coagulation Profile: No results for input(s): "INR", "PROTIME" in the last 168 hours.  Cardiac Enzymes: No results for input(s): "CKTOTAL", "CKMB", "CKMBINDEX", "TROPONINI" in the last 168 hours. BNP (last 3 results) No results for input(s): "PROBNP" in the last 8760 hours. HbA1C: Recent Labs    04/16/23 1227  HGBA1C 5.6   CBG: Recent Labs  Lab 04/18/23 1103 04/18/23 1553 04/18/23 2055 04/19/23 0600 04/19/23 1118  GLUCAP  217* 256* 191* 194* 223*   Lipid Profile: No results for input(s): "CHOL", "HDL", "LDLCALC", "TRIG", "CHOLHDL", "LDLDIRECT" in the last 72 hours. Thyroid Function Tests: No results for input(s): "TSH", "T4TOTAL", "FREET4", "T3FREE", "THYROIDAB" in the last 72 hours. Anemia Panel: No results for input(s): "VITAMINB12", "FOLATE", "FERRITIN", "TIBC", "IRON", "RETICCTPCT" in the last 72 hours.  Urine analysis:    Component Value Date/Time   COLORURINE YELLOW 04/12/2023 2129   APPEARANCEUR CLEAR 04/12/2023 2129   LABSPEC 1.015 04/12/2023 2129   PHURINE 5.5 04/12/2023 2129   GLUCOSEU NEGATIVE 04/12/2023 2129   HGBUR NEGATIVE 04/12/2023 2129   BILIRUBINUR NEGATIVE 04/12/2023 2129   KETONESUR NEGATIVE 04/12/2023 2129   PROTEINUR NEGATIVE 04/12/2023 2129   NITRITE NEGATIVE 04/12/2023 2129   LEUKOCYTESUR SMALL (A) 04/12/2023 2129   Sepsis Labs: @LABRCNTIP (procalcitonin:4,lacticidven:4)  ) Recent Results (from the past 240 hours)  Blood Culture (routine x 2)     Status: None   Collection Time: 04/12/23  8:50 AM   Specimen: BLOOD  RIGHT HAND  Result Value Ref Range Status   Specimen Description BLOOD RIGHT HAND  Final   Special Requests   Final    AEROBIC BOTTLE ONLY Blood Culture results may not be optimal due to an inadequate volume of blood received in culture bottles   Culture   Final    NO GROWTH 5 DAYS Performed at [facility], [address] [place], [place] [address]    Report Status 04/17/2023 FINAL  Final  Blood Culture (routine x 2)     Status: None   Collection Time: 04/12/23  8:50 PM   Specimen: BLOOD LEFT ARM  Result Value Ref Range Status   Specimen Description BLOOD LEFT ARM  Final   Special Requests   Final    BOTTLES DRAWN AEROBIC ONLY Blood Culture results may not be optimal due to an inadequate volume of blood received in culture bottles   Culture   Final    NO GROWTH 5 DAYS Performed at [facility], [address], [place], [place] [address]     Report Status 04/17/2023 FINAL  Final     Radiology Studies: No results found.    Scheduled Meds:  apixaban  5 mg Oral BID   carvedilol  25 mg Oral BID WC   digoxin  0.125 mg Intravenous BID   feeding supplement  237 mL Oral BID BM   insulin aspart  0-15 Units Subcutaneous TID WC   insulin aspart  3 Units Subcutaneous TID WC   insulin glargine-yfgn  15 Units Subcutaneous Daily   multivitamin with minerals  1 tablet Oral Daily   mouth rinse  15 mL Mouth Rinse 4 times per day   pantoprazole  40 mg Oral Daily   prednisoLONE acetate  1 drop Left Eye TID   Continuous Infusions:  vancomycin       LOS: 7 days    Time spent:    Zannie Cove, MD [facility]   04/19/2023, 12:19 PM

## 2023-04-19 NOTE — Progress Notes (Signed)
 This chaplain responded to PMT NP-Kasie consult for EOL spiritual care, specifically spouse care. The chaplain introduced herself and began rapport building with the Pt. spouse-[name].   [name] participates in consistent redirection and shared words of comfort with the Pt. during the visit. The chaplain listened reflectively as [name] shared her anticipatory grief with the chaplain  through story telling of their life together and plans for retirement.   The Pt. and [name] are supported by two sons and one grandson. The chaplain understands a son and grandson share the family home with the Pt.   [name] accepted the chaplain's invitation for prayer after hearing the Pt. story of his faith.  This chaplain is available for F/U spiritual care as needed.  Chaplain Stephanie Acre [phone]

## 2023-04-19 NOTE — Progress Notes (Signed)
 Daily Progress Note   [name]       Date: 04/19/2023 DOB: [birth year]  Age: 83 y.o. MRN#: [id number] Attending Physician: Zannie Cove, MD Primary Care Physician: Thana Ates, MD Admit Date: 04/12/2023  Reason for Consultation/Follow-up: Establishing goals of care  Patient Profile/HPI:  83 y.o. 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 Has had worsening dyspnea related to heart failure decompensation. Admission has been complicated by delirium, heart failure exacerbation, afib and CKD, as well as necrotic infected wound. Palliative consulted for GOC.   Subjective: Chart reviewed including labs, progress notes, imaging from this and previous encounters.  Patient has continued to decline. Hypotensive today which restricts adequate pain management. Persistent confusion and poor po intake.  Spouse Judy at bed. She understands per her discussion with Dr. Jomarie Longs that patient is at end of life. She wishes to take patient home with hospice.  Discussed comfort measures- Discussed transition to comfort measures only which includes stopping IV fluids, antibiotics, labs and providing symptom management for SOB, anxiety, nausea, vomiting, and other symptoms of dying.  Judy's primary goal is that patient doesn't suffer and she is able to take him home. She agrees to referral for hospice to come discuss hospice at home with her.  Pain regimen discussed with RN- patient responds well to oxycodone 5mg  for now, patient likely to benefit from scheduled doses.   Review of Systems  Unable  to perform ROS: Mental status change     Physical Exam Vitals and nursing note reviewed.  Constitutional:      General: He is not in acute distress.    Appearance: He is ill-appearing.  Cardiovascular:     Rate and Rhythm: Normal rate.  Pulmonary:     Effort: Pulmonary effort is normal.  Neurological:     Mental Status: He is disoriented.            Vital Signs: BP (!) 104/57   Pulse (!) 114   Temp 99.3 F (37.4 C) (Oral)   Resp (!) 26   Ht 5\' 9"  (1.753 m)   Wt 100.2 kg   SpO2 97%   BMI 32.62 kg/m  SpO2: SpO2: 97 % O2 Device: O2 Device: Room Air O2 Flow Rate: O2 Flow Rate (L/min): 2 L/min  Intake/output summary:  Intake/Output Summary (Last 24 hours) at 04/19/2023 1445 Last data filed at 04/19/2023 1300 Gross per 24 hour  Intake 1155.54 ml  Output 1000 ml  Net 155.54 ml   LBM: Last BM Date : 04/15/23 Baseline Weight: Weight: 104.3 kg Most recent weight: Weight: 100.2 kg       Palliative Assessment/Data: PPS: 30%      Patient Active Problem List   Diagnosis Date Noted   Acute metabolic encephalopathy 04/12/2023   Lactic acidosis 04/12/2023   Symptomatic anemia 04/12/2023   Acute on chronic systolic CHF (congestive heart failure) (HCC) 04/12/2023   Obesity, class 1 04/12/2023   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) 08/29/2021   Chronic insomnia 02/25/2021   Chronic kidney disease, stage 3a (HCC) 02/25/2021   HLD (hyperlipidemia) 02/25/2021   Obstructive sleep apnea 02/25/2021   Encephalopathy acute 02/25/2021   Leukocytosis 02/25/2021   Infection of right hand 04/10/2020   Acute osteomyelitis of hand including fingers, right (HCC) 04/10/2020   PAF (paroxysmal atrial fibrillation) (HCC) 04/10/2020   Weakness 01/06/2020   Orthostatic hypotension 01/06/2020   Type 2 diabetes mellitus with obesity (HCC) 01/06/2020   Glaucoma 01/06/2020   Tachycardia 07/10/2019   Essential hypertension 08/01/2013    Palliative Care Assessment & Plan     Assessment/Recommendations/Plan  Transition care plan to comfort measures only Start oxycodone 5mg  po q 6hr scheduled- continue hydromorphone 0.5mg  IV prn  Comfort medications ordered TOC order placed for hospice referral   Code Status:   Code Status: Do not attempt resuscitation (DNR) - Comfort care   Prognosis:  < 2 weeks  Discharge Planning: Home with Hospice  Care plan was discussed with patient's spouse and care team.   Thank you for allowing the Palliative Medicine Team to assist in the care of this patient.  Total time: 60 minutes Prolonged billing:  Time includes:   Preparing to see the patient (e.g., review of tests) Obtaining and/or reviewing separately obtained history Performing a medically necessary appropriate examination and/or evaluation Counseling and educating the patient/family/caregiver Ordering medications, tests, or procedures Referring and communicating with other health care professionals (when not reported separately) Documenting clinical information in the electronic or other health record Independently interpreting results (not reported separately) and communicating results to the patient/family/caregiver Care coordination (not reported separately) Clinical documentation  Ocie Bob, AGNP-C Palliative Medicine   Please contact Palliative Medicine Team phone at [phone] for questions and concerns.

## 2023-04-19 NOTE — Plan of Care (Signed)
   Problem: Education: Goal: Knowledge of General Education information will improve Description: Including pain rating scale, medication(s)/side effects and non-pharmacologic comfort measures Outcome: Progressing   Problem: Coping: Goal: Level of anxiety will decrease Outcome: Progressing

## 2023-04-20 ENCOUNTER — Other Ambulatory Visit (HOSPITAL_COMMUNITY): Payer: Self-pay

## 2023-04-20 DIAGNOSIS — D649 Anemia, unspecified: Secondary | ICD-10-CM | POA: Diagnosis not present

## 2023-04-20 DIAGNOSIS — Z66 Do not resuscitate: Secondary | ICD-10-CM

## 2023-04-20 DIAGNOSIS — A419 Sepsis, unspecified organism: Secondary | ICD-10-CM

## 2023-04-20 DIAGNOSIS — Z789 Other specified health status: Secondary | ICD-10-CM

## 2023-04-20 LAB — GLUCOSE, CAPILLARY
Glucose-Capillary: 184 mg/dL — ABNORMAL HIGH (ref 70–99)
Glucose-Capillary: 268 mg/dL — ABNORMAL HIGH (ref 70–99)

## 2023-04-20 MED ORDER — OXYCODONE-ACETAMINOPHEN 5-325 MG PO TABS
1.0000 | ORAL_TABLET | Freq: Three times a day (TID) | ORAL | Status: AC
Start: 2023-04-20 — End: ?

## 2023-04-20 MED ORDER — ENSURE ENLIVE PO LIQD: 237.0000 mL | ORAL | Status: DC | PRN

## 2023-04-20 MED ORDER — LORAZEPAM 1 MG PO TABS
1.0000 mg | ORAL_TABLET | ORAL | 0 refills | Status: AC | PRN
  Filled 2023-04-20: qty 60, 15d supply, fill #0

## 2023-04-20 NOTE — Plan of Care (Signed)
  Problem: Coping: Goal: Level of anxiety will decrease Outcome: Progressing   Problem: Nutrition: Goal: Adequate nutrition will be maintained Outcome: Not Progressing   Problem: Health Behavior/Discharge Planning: Goal: Ability to manage health-related needs will improve Outcome: Not Progressing   Problem: Nutritional: Goal: Maintenance of adequate nutrition will improve Outcome: Not Progressing

## 2023-04-20 NOTE — Progress Notes (Signed)
 Daily Progress Note   [name]       Date: 04/20/2023 DOB: [birth year]  Age: 83 y.o. MRN#: [id number] Attending Physician: Zannie Cove, MD Primary Care Physician: Thana Ates, MD Admit Date: 04/12/2023  Reason for Consultation/Follow-up: Non pain symptom management, Pain control, Psychosocial/spiritual support, and Terminal Care  Subjective: I have reviewed medical records including EPIC notes, MAR, any available advanced directives as necessary, and labs. Received report from primary RN - no acute concerns.  Went to visit patient at bedside - no family/visitors present. Patient was lying in bed - he briefly responds to gentle touch and bed adjustment for comfort, but is overall lethargic. Signs and gestures of pain/discomfort noted. No respiratory distress, increased work of breathing, or secretions noted.   Per previous discussions - plan is for patient's discharge home with hospice. TOC consult was previously placed.  Requested RN administer dose of dilaudid.  Length of Stay: 8  Current Medications: Scheduled Meds:   carvedilol  25 mg Oral BID WC   feeding supplement  237 mL Oral BID BM   mouth rinse  15 mL Mouth Rinse 4 times per day   oxyCODONE  5 mg Oral Q6H   pantoprazole  40 mg Oral Daily   prednisoLONE acetate  1 drop Left Eye TID    Continuous Infusions:   PRN Meds: acetaminophen **OR** acetaminophen, antiseptic oral rinse, glycopyrrolate **OR** glycopyrrolate **OR** glycopyrrolate, haloperidol **OR** haloperidol **OR** haloperidol lactate, HYDROmorphone (DILAUDID) injection, LORazepam **OR** LORazepam **OR** LORazepam, ondansetron **OR** ondansetron (ZOFRAN) IV, mouth rinse, oxyCODONE, polyvinyl alcohol  Physical Exam Vitals and nursing note  reviewed.  Constitutional:      General: He is not in acute distress.    Appearance: He is ill-appearing.  Pulmonary:     Effort: No respiratory distress.  Skin:    General: Skin is warm and dry.  Neurological:     Mental Status: He is lethargic.     Motor: Weakness present.             Vital Signs: BP 125/71   Pulse (!) 110   Temp 97.7 F (36.5 C) (Oral)   Resp 20   Ht 5\' 9"  (1.753 m)   Wt 100.2 kg   SpO2 98%   BMI 32.62 kg/m  SpO2: SpO2: 98 % O2 Device:  O2 Device: Room Air O2 Flow Rate: O2 Flow Rate (L/min): 2 L/min  Intake/output summary:  Intake/Output Summary (Last 24 hours) at 04/20/2023 0454 Last data filed at 04/20/2023 0900 Gross per 24 hour  Intake 1245.54 ml  Output 600 ml  Net 645.54 ml   LBM: Last BM Date : 04/15/23 Baseline Weight: Weight: 104.3 kg Most recent weight: Weight: 100.2 kg       Palliative Assessment/Data: PPS 10%      Patient Active Problem List   Diagnosis Date Noted   Acute metabolic encephalopathy 04/12/2023   Lactic acidosis 04/12/2023   Symptomatic anemia 04/12/2023   Acute on chronic systolic CHF (congestive heart failure) (HCC) 04/12/2023   Obesity, class 1 04/12/2023   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) 08/29/2021   Chronic insomnia 02/25/2021   Chronic kidney disease, stage 3a (HCC) 02/25/2021   HLD (hyperlipidemia) 02/25/2021   Obstructive sleep apnea 02/25/2021   Encephalopathy acute 02/25/2021   Leukocytosis 02/25/2021   Infection of right hand 04/10/2020   Acute osteomyelitis of hand including fingers, right (HCC) 04/10/2020   PAF (paroxysmal atrial fibrillation) (HCC) 04/10/2020   Weakness 01/06/2020   Orthostatic hypotension 01/06/2020   Type 2 diabetes mellitus with obesity (HCC) 01/06/2020   Glaucoma 01/06/2020   Tachycardia 07/10/2019   Essential hypertension 08/01/2013    Palliative Care Assessment & Plan   Patient Profile: 83 y.o. 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 Has had worsening dyspnea related to heart failure decompensation. Admission has been complicated by delirium, heart failure exacerbation, afib and CKD, as well as necrotic infected wound.  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 Copy was made and will be scanned into Vynca/ACP tab Plan is for patient's discharge home with hospice - [facility] consult previously placed Continue palliative wound care Continue current comfort focused medication regimen - no changes Adjusted orders to reflect full comfort measures PMT will continue to follow and support holistically  Symptom Management Dilaudid PRN pain/dyspnea/increased work of breathing/RR>25 Scheduled oxycodone q6h; PRN doses for breakthrough pain Tylenol PRN pain/fever Biotin twice daily Benadryl PRN itching Robinul PRN secretions Haldol PRN agitation/delirium Ativan PRN anxiety/seizure/sleep/distress Zofran PRN nausea/vomiting Liquifilm Tears PRN dry eye Continue coreg while tolerating POs   Goals of Care and Additional Recommendations: Limitations on Scope of Treatment: Full Comfort Care  Code Status:    Code Status Orders  (From admission, onward)           Start     Ordered   04/19/23 1351  Do not  attempt resuscitation (DNR) - Comfort care  Continuous       Question Answer Comment  If patient has no pulse and is not breathing Do Not Attempt Resuscitation   In Pre-Arrest Conditions (Patient Is Breathing and Has a Pulse) Provide comfort measures. Relieve any mechanical airway obstruction.  Avoid transfer unless required for comfort.   Consent: Discussion documented in EHR or advanced directives reviewed      04/19/23 1352           Code Status History     Date Active Date Inactive Code Status Order ID Comments User Context   04/14/2023 1506 04/19/2023 1352 Limited: Do not attempt resuscitation (DNR) -DNR-LIMITED -Do Not Intubate/DNI  [id number]  Coralie Keens, MD Inpatient   04/12/2023 1203 04/14/2023 1506 Full Code [id number]  Coralie Keens, MD ED   08/29/2021 1652 08/31/2021 1720 Full Code [id number]  Cipriano Bunker, MD ED   02/25/2021 1613 02/27/2021 1948 Full Code [id number]  Orland Mustard, MD ED   04/10/2020 0244 04/14/2020 2117 Full Code [id number]  Hillary Bow, DO ED   01/06/2020 1124 01/15/2020 0226 Full Code [id number]  Clydie Braun, MD ED      Advance Directive Documentation    Flowsheet Row Most Recent Value  Type of Advance Directive Healthcare Power of Attorney, Living will  Pre-existing out of facility DNR order (yellow form or pink MOST form) --  "MOST" Form in Place? --       Prognosis:  < 2 weeks  Discharge Planning: Home with Hospice  Care plan was discussed with primary RN  Thank you for allowing the Palliative Medicine Team to assist in the care of this patient.     Haskel Khan, NP  Please contact Palliative Medicine Team phone at [phone] for questions and concerns.   *Portions of this note are a verbal dictation therefore any spelling and/or grammatical errors are due to the "[employer] One" system interpretation.

## 2023-04-20 NOTE — TOC Initial Note (Signed)
 Transition of Care (TOC) - Initial/Assessment Note  Donn Pierini RN, BSN Transitions of Care Unit 4E- RN Case Manager See Treatment Team for direct phone #   Patient Details  Name: [name] MRN: [id number] Date of Birth: 1940/05/23  Transition of Care [facility] [place]) CM/SW Contact:    Darrold Span, RN Phone Number: 04/20/2023, 9:42 AM  Clinical Narrative:                 Referral received from [facility] that pt/wife have made decision for Hospice care at home. Pt already has referral with [facility] who has been following.   Msg sent to [facility] liaison that pt now needs Home Hospice. Liaison to f/u with wife.   EDD per MD 1-2 days. Pt will go home via EMS. Will need GOLD DNR  Expected Discharge Plan: Home w Hospice Care Barriers to Discharge: Continued Medical Work up   Patient Goals and CMS Choice Patient states their goals for this hospitalization and ongoing recovery are:: return home w/ hospice CMS Medicare.gov Compare Post Acute Care list provided to:: Patient Choice offered to / list presented to : Patient      Expected Discharge Plan and Services   Discharge Planning Services: CM Consult Post Acute Care Choice: Hospice, Durable Medical Equipment Living arrangements for the past 2 months: Single Family Home                           HH Arranged: Disease Management HH Agency: [facility] Date [facility] Agency Contacted: 04/19/23 Time HH Agency Contacted: [phone] Representative spoke with at [facility] Agency: Shawn  Prior Living Arrangements/Services Living arrangements for the past 2 months: Single Family Home Lives with:: Spouse Patient language and need for interpreter reviewed:: Yes Do you feel safe going back to the place where you live?: Yes      Need for Family Participation in Patient Care: Yes (Comment) Care giver support system in place?: Yes (comment) Current home services: DME Criminal Activity/Legal Involvement Pertinent to  Current Situation/Hospitalization: No - Comment as needed  Activities of Daily Living   ADL Screening (condition at time of admission) Independently performs ADLs?: No Does the patient have a NEW difficulty with bathing/dressing/toileting/self-feeding that is expected to last >3 days?: No (needs assist) Does the patient have a NEW difficulty with getting in/out of bed, walking, or climbing stairs that is expected to last >3 days?: No Does the patient have a NEW difficulty with communication that is expected to last >3 days?: No Is the patient deaf or have difficulty hearing?: No Does the patient have difficulty seeing, even when wearing glasses/contacts?: No Does the patient have difficulty concentrating, remembering, or making decisions?: Yes  Permission Sought/Granted Permission sought to share information with : Facility Industrial/product designer granted to share information with : Yes, Verbal Permission Granted     Permission granted to share info w AGENCY: Hospice        Emotional Assessment Appearance:: Appears stated age Attitude/Demeanor/Rapport: Unable to Assess Affect (typically observed): Unable to Assess   Alcohol / Substance Use: Not Applicable Psych Involvement: No (comment)  Admission diagnosis:  Acute metabolic encephalopathy [G93.41] Lactic acidosis [E87.20] Patient Active Problem List   Diagnosis Date Noted   Acute metabolic encephalopathy 04/12/2023   Lactic acidosis 04/12/2023   Symptomatic anemia 04/12/2023   Acute on chronic systolic CHF (congestive heart failure) (HCC) 04/12/2023   Obesity, class 1 04/12/2023   Osteomyelitis of  fifth toe of left foot (HCC) 08/29/2021   Chronic insomnia 02/25/2021   Chronic kidney disease, stage 3a (HCC) 02/25/2021   HLD (hyperlipidemia) 02/25/2021   Obstructive sleep apnea 02/25/2021   Encephalopathy acute 02/25/2021   Leukocytosis 02/25/2021   Infection of right hand 04/10/2020   Acute osteomyelitis of  hand including fingers, right (HCC) 04/10/2020   PAF (paroxysmal atrial fibrillation) (HCC) 04/10/2020   Weakness 01/06/2020   Orthostatic hypotension 01/06/2020   Type 2 diabetes mellitus with obesity (HCC) 01/06/2020   Glaucoma 01/06/2020   Tachycardia 07/10/2019   Essential hypertension 08/01/2013   PCP:  Thana Ates, MD Pharmacy:   [facility] [address], [place] - [address] [place] [address] Phone: [phone] Fax: [phone]     Social Drivers of Health (SDOH) Social History: SDOH Screenings   Food Insecurity: No Food Insecurity (04/12/2023)  Housing: Low Risk  (04/12/2023)  Transportation Needs: No Transportation Needs (04/12/2023)  Utilities: Not At Risk (04/12/2023)  Social Connections: Unknown (04/12/2023)  Tobacco Use: Medium Risk (04/12/2023)   SDOH Interventions:     Readmission Risk Interventions     No data to display

## 2023-04-20 NOTE — Progress Notes (Signed)
 [facility] Liaison Note  Received request from Transitions of Care Manager, for hospice services at home after discharge. Spoke with spouse, [name] to initiate education related to hospice philosophy, services, and team approach to care. Patient/family verbalized understanding of information given. Per discussion, the plan is for discharge home by PTAR/EMS or private vehicle, date to be determined.   DME needs discussed. Patient has a walker and wheelchair in the home. Patient/family requests the following equipment for delivery: hospital bed and overbed table. The address has been verified and is correct in the chart. [name] is the family contact to arrange time of equipment delivery.  Please send signed and completed DNR home with patient/family. Please provide prescriptions at discharge as needed to ensure ongoing symptom management.   [facility] information and contact numbers given to [place]. Above information shared with Silva Bandy, Transitions of Care Manager. Please call with any questions or concerns.   Thank you for the opportunity to participate in this patient's care.   Glenna Fellows BSN, Charity fundraiser, OCN [employer] [phone]

## 2023-04-20 NOTE — Progress Notes (Addendum)
 Pt transferred to [id number]. RN given report to receiving nurse, Emmamarie,RN. Pt belonging transported with pt. Pt observed to be comfortable and alert. Drexel Gentles, Preetha,MD notified.

## 2023-04-20 NOTE — Progress Notes (Signed)
 This chaplain is present for F/U EOL spriitual care. The Pt. is sleeping at the time of the visit.  Family is not at the bedside.   The chaplain understands from the NT outside the room, the Pt. wife has not been at the bedside today. The chaplain is available for F/U spiritual care as needed.  Chaplain Stephanie Acre [phone]

## 2023-04-20 NOTE — Consult Note (Signed)
 WOC consulted for foot wound, this wound is followed by Dr. Lajoyce Corners (see his note 4/8) and he requests dry dressing to wound daily.  WOC team also consulted on this wound 4/9 and placed orders for dry dressing that Dr. Lajoyce Corners desires.   WOC team will not follow. Re-consult if further needs arise.   Thank you,    Priscella Mann MSN, RN-BC, [employer] [phone]

## 2023-04-20 NOTE — Discharge Summary (Signed)
 Physician Discharge Summary  [name] BJY:[id number] DOB: August 04, 1940 DOA: 04/12/2023  PCP: Thana Ates, MD  Admit date: 04/12/2023 Discharge date: 04/20/2023  Time spent: 45 minutes  Recommendations for Outpatient Follow-up:  Home with hospice for comfort focused care   Discharge Diagnoses:  Principal Problem: Severe iron deficiency anemia Acute systolic CHF Severe mitral regurgitation Moderate aortic stenosis Severe encephalopathy Osteomyelitis Left foot wound infection, necrotic eschar   Acute on chronic systolic CHF (congestive heart failure) (HCC)   Acute metabolic encephalopathy   PAF (paroxysmal atrial fibrillation) (HCC)   Chronic kidney disease, stage 3a (HCC)   Essential hypertension   Type 2 diabetes mellitus with obesity (HCC)   Obesity, class 1   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  He noted easy fatigability, and dyspnea on exertion, -In ED, VSS, Left foot surgical wound with necrotic eschar and some signs of active bleeding, small amount of drainage. Labs -bun 55 cr 1,91 AST 686 ALT 577, BNP 1,901 Lactic acid 8.6, Wbc 14.8 hgb 4.1 (from 9,1 on 03/16/23), plt 395 Fecal occult test negative   CXR w/ vascular congestion, bilateral central interstitial infiltrates, with bilateral pleural effusion  -Left foot radiograph with sp proximal transmetatarsal amputation of the fifth toe. no radiographic evidence of acute osteomyelitis  -4/03 2 units PRBC transfusion. placed on IV furosemide for diuresis.  -4/04 hgb 7.6. for 2 more  units PRBC transfusion, volume status has improved.   -4/05 significant agitation, has developed atrial fibrillation with rapid ventricular response. Echo with reduced EF, aortic stenosis, MR -4/06  resumed apixaban.  -4/ 07 palliative consulted -4/08 weak, confused, seen by Dr. Lajoyce Corners, recommended wound care    Hospital Course:   Iron defi anemia -hemoccult neg -SP 4 units PRBC transfusion.  -Anemia panel w/ Iron defi -sp IV iron,  -not a good candidate for GI workup now in the setting of agitation and confusion   Acute on chronic systolic CHF Severe MR Mod AS -Echo w/ EF 30 to 35%, global hypokinesis, RV systolic function preserved, severe mitral valve regurgitation, moderate mitral valve stenosis, moderate aortic valve stenosis,  -Continue carvedilol -GDMT limited by hypotension -Not appropriate for ischemic eval with confusion, encephalopathy -Seen by palliative care in consultation, now comfort care plan for discharge home with hospice services   Acute metabolic encephalopathy Multifactorial encephalopathy-likely secondary to necrotic wound, infection, suspect under Cognitive dysfunction at baseline, noted to have ongoing intermittent agitation throughout this admission --dysphagia 3 with aspiration precautions.  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 Lajoyce Corners 4/8, felt to be primarily microcirculatory disease, recommended wound care, not a candidate for further amputations at this time -Treated with 6 days of IV cefepime and vancomycin -DC cefepime 4/9 with ongoing encephalopathy -Now continue routine wound care   PAF (paroxysmal atrial fibrillation) (HCC) Patient with atrial fibrillation and atrial flutter with rapid ventricular response.  -continue carvedilol,, dose increased, add  digoxin Anticoagulation with apixaban   Chronic kidney disease, stage 3a (HCC) AKI, Hyponatremia, Lactic acidosis. Hypokalemia.  monitor   Type 2 diabetes mellitus with obesity (HCC) Hyperglycemia.  -basal insulin 10 units , ssi   Obesity, class 1 Calculated BMI is 33.9   Consultations: Palliative care, orthopedics  Discharge Exam: Vitals:   04/20/23 0734 04/20/23 0908  BP: 114/64 125/71  Pulse: (!) 50 (!) 110  Resp: 20   Temp: 97.7 F (36.5 C)   SpO2: 98%    Chronically ill male laying in bed, confused HEENT: No JVD CVS: S1-S2, irregular rhythm, tachycardic, systolic murmur Lungs: Decreased breath sounds to bases Abdomen: Soft, nontender, bowel sounds present EXTR midis: Trace edema, foot with amputation, necrotic wound    Discharge Instructions    Allergies as of 04/20/2023       Reactions   Ambien [zolpidem Tartrate] Other (See Comments)   Confusion Memory loss   Aspirin Other (See Comments)   Bleeding ulcer   Cosopt [dorzolamide Hcl-timolol Mal] Other (See Comments)   Red and burning eyes   Diamox [acetazolamide] Other (See Comments)   Unknown reaction        Medication List     STOP taking these medications    amLODipine 5 MG tablet Commonly known as: NORVASC   apixaban 5 MG Tabs tablet Commonly known as: ELIQUIS   metFORMIN 500 MG tablet Commonly known as: GLUCOPHAGE   multivitamin tablet   Trulicity 1.5 MG/0.5ML Soaj Generic drug: Dulaglutide       TAKE these medications    acetaminophen 650 MG CR tablet Commonly known as: TYLENOL Take 1,300 mg by mouth daily as needed for pain.   carvedilol 6.25 MG tablet Commonly known as: COREG Take 6.25 mg by mouth 2 (two) times daily with a meal.   DULoxetine 60 MG capsule Commonly known as: CYMBALTA Take 60 mg by mouth daily.   gabapentin 600 MG tablet Commonly known as: NEURONTIN Take 600 mg by mouth 2 (two) times daily.   LORazepam 2 MG/ML concentrated solution Commonly  known as: ATIVAN Place 0.5 mLs (1 mg total) under the tongue every 4 (four) hours as needed for anxiety.   oxyCODONE-acetaminophen 5-325 MG tablet Commonly known as: PERCOCET/ROXICET Take 1 tablet by mouth in the morning, at noon, and at bedtime.   prednisoLONE acetate 1 % ophthalmic suspension Commonly known as: PRED FORTE Place 1 drop into the left eye 3 (three) times daily.   traZODone 100 MG tablet Commonly known as: DESYREL Take 100 mg by mouth at bedtime.       Allergies  Allergen Reactions   Ambien [Zolpidem Tartrate] Other (See Comments)    Confusion Memory loss   Aspirin Other (See Comments)    Bleeding ulcer   Cosopt [Dorzolamide Hcl-Timolol Mal] Other (See Comments)    Red and burning eyes   Diamox [Acetazolamide] Other (See Comments)    Unknown reaction    Follow-up Information     [facility] Follow up.   Specialty: Hospice and Palliative Medicine Why: Home Hospice referral Contact information: [address] [place] [address] [phone]                 The results of significant diagnostics from this hospitalization (including imaging, microbiology, ancillary and laboratory) are listed below for reference.    Significant Diagnostic Studies: DG Swallowing Func-Speech Pathology Result Date: 04/16/2023 Table formatting from the original result was not included. Modified Barium Swallow Study Patient Details Name: Pierre Dellarocco  Woehler MRN: [id number] Date of Birth: 1940/05/29 Today's Date: 04/16/2023 HPI/PMH: HPI: JAHMEIR GEISEN is a 83 y.o. male who, per chart review, was noted by wife at home to have easy fatigability, and dyspnea on exertion, to the point where he did not wanted to get out of the bed any more. His po intake had significantly decreased over the last 48 hrs. CXR 4/3 suggestive of CHF or pulmonary edema.  Head CT 4/3 without acute findings.  Pt with medical history significant of T2DM, hypertension, hyperlipidemia,  paroxysmal atrial fibrillation, dyslipidemia, diabetic neuropathy, venous insufficiency, and hx of GI bleed who had a recent left foot 5th ray amputation due to osteomyelitis, on 03/16/23. Clinical Impression: Clinical Impression: Pt has a mild oral dysphagia with relatively functional pharyngeal phase. He has reduced lingual hold and bolus cohesion, with repetitive lingual motion espcially with purees (with liquids, it is less repetitive but sometimes slow or delayed). Mastication is prolonged but effective. There is a collection of oral residue that he will often self-manage with a spontaneous second swallow, although especially with thin liquids this can spill into the valleculae first. His swallow initiation occurs promptly though with good timing, pharyngeal efficiency, and airway protection. No aspiration or penetration is observed (cough x1 during MBS not associated with aspiration). The barium tablet cleared his oral cavity and pharynx well but did appear to get stopped in his esophagus, moving a little further down with dry swallows. Recommend that pt continue with mechanical soft diet but will adjust liquids to be thin. Factors that may increase risk of adverse event in presence of aspiration Rubye Oaks & Clearance Coots 2021): Factors that may increase risk of adverse event in presence of aspiration Rubye Oaks & Clearance Coots 2021): Poor general health and/or compromised immunity; Reduced cognitive function Recommendations/Plan: Swallowing Evaluation Recommendations Swallowing Evaluation Recommendations Recommendations: PO diet PO Diet Recommendation: Dysphagia 3 (Mechanical soft); Thin liquids (Level 0) Liquid Administration via: Cup; Straw Medication Administration: Whole meds with liquid (may want to crush larger pills) Supervision: Staff to assist with self-feeding Swallowing strategies  : Slow rate; Small bites/sips; Follow solids with liquids Postural changes: Position pt fully upright for meals; Stay upright 30-60 min  after meals Oral care recommendations: Oral care BID (2x/day) Treatment Plan Treatment Plan Treatment recommendations: Therapy as outlined in treatment plan below Follow-up recommendations: No SLP follow up Functional status assessment: Patient has had a recent decline in their functional status and demonstrates the ability to make significant improvements in function in a reasonable and predictable amount of time. Treatment frequency: Min 2x/week Treatment duration: 1 week Interventions: Aspiration precaution training; Patient/family education; Diet toleration management by SLP; Trials of upgraded texture/liquids Recommendations Recommendations for follow up therapy are one component of a multi-disciplinary discharge planning process, led by the attending physician.  Recommendations may be updated based on patient status, additional functional criteria and insurance authorization. Assessment: Orofacial Exam: No data recorded Anatomy: Anatomy: WFL Boluses Administered: Boluses Administered Boluses Administered: Thin liquids (Level 0); Mildly thick liquids (Level 2, nectar thick); Moderately thick liquids (Level 3, honey thick); Puree; Solid  Oral Impairment Domain: Oral Impairment Domain Lip Closure: No labial escape Tongue control during bolus hold: Posterior escape of less than half of bolus Bolus preparation/mastication: Timely and efficient chewing and mashing Bolus transport/lingual motion: Repetitive/disorganized tongue motion Oral residue: Residue collection on oral structures Location of oral residue : Floor of mouth; Tongue (spills to valleculae) Initiation of pharyngeal swallow : Valleculae  Pharyngeal Impairment Domain: Pharyngeal Impairment Domain Soft palate elevation: No  bolus between soft palate (SP)/pharyngeal wall (PW) Laryngeal elevation: Complete superior movement of thyroid cartilage with complete approximation of arytenoids to epiglottic petiole Anterior hyoid excursion: Complete anterior  movement Epiglottic movement: Complete inversion Laryngeal vestibule closure: Complete, no air/contrast in laryngeal vestibule Pharyngeal stripping wave : Present - complete Pharyngeal contraction (A/P view only): N/A Pharyngoesophageal segment opening: Complete distension and complete duration, no obstruction of flow Tongue base retraction: No contrast between tongue base and posterior pharyngeal wall (PPW) Pharyngeal residue: Complete pharyngeal clearance Location of pharyngeal residue: N/A  Esophageal Impairment Domain: Esophageal Impairment Domain Esophageal clearance upright position: Esophageal retention Pill: Pill Consistency administered: Thin liquids (Level 0) Thin liquids (Level 0): Impaired (see clinical impressions) Penetration/Aspiration Scale Score: Penetration/Aspiration Scale Score 1.  Material does not enter airway: Thin liquids (Level 0); Mildly thick liquids (Level 2, nectar thick); Moderately thick liquids (Level 3, honey thick); Puree; Solid; Pill Compensatory Strategies: No data recorded  General Information: Caregiver present: No  Diet Prior to this Study: Dysphagia 3 (mechanical soft); Mildly thick liquids (Level 2, nectar thick)   Temperature : Normal   Respiratory Status: WFL   Supplemental O2: None (Room air)   History of Recent Intubation: No  Behavior/Cognition: Alert; Cooperative; Requires cueing Self-Feeding Abilities: Needs assist with self-feeding Baseline vocal quality/speech: Normal No data recorded Volitional Swallow: Able to elicit Exam Limitations: No limitations Goal Planning: Prognosis for improved oropharyngeal function: Fair Barriers to Reach Goals: Cognitive deficits No data recorded Patient/Family Stated Goal: unable to state, no family present Consulted and agree with results and recommendations: Patient; Physician; Nurse Pain: Pain Assessment Pain Assessment: Faces Faces Pain Scale: 0 Breathing: 1 Negative Vocalization: 1 Facial Expression: 2 Body Language: 1  Consolability: 1 PAINAD Score: 6 End of Session: Start Time:SLP Start Time (ACUTE ONLY): 0933 Stop Time: SLP Stop Time (ACUTE ONLY): 0947 Time Calculation:SLP Time Calculation (min) (ACUTE ONLY): 14 min Charges: SLP Evaluations $ SLP Speech Visit: 1 Visit SLP Evaluations $MBS Swallow: 1 Procedure SLP visit diagnosis: SLP Visit Diagnosis: Dysphagia, oral phase (R13.11) Past Medical History: Past Medical History: Diagnosis Date  Adenomatous colon polyp   Allergic rhinitis   Anemia   Aortic stenosis 07/30/2019  mild to moderate AS (AVA VTI 1.37 cm, AV mean gradient 15.0 mmHg, AV Vmax 2.56 m/s)  BMI 40.0-44.9, adult (HCC)   Chronic insomnia   Chronic low back pain   Constipation, unspecified   Diabetes mellitus (HCC) 2015  DJD (degenerative joint disease) of knee   Hypercalcemia   Hypertension   Hypertriglyceridemia   Junctional tachycardia (HCC)   Lumbar facet arthropathy   Morbid obesity (HCC)   Neuropathy   Nonallergic rhinitis   OSA (obstructive sleep apnea)   wife denies  Other chronic pain   PAF (paroxysmal atrial fibrillation) (HCC) 01/2020  Peripheral axonal neuropathy   Upper GI bleed   due to aspirin  Venous insufficiency   Venous stasis dermatitis of both lower extremities  Past Surgical History: Past Surgical History: Procedure Laterality Date  AMPUTATION Right 08/30/2021  Procedure: Right index finger Amputaion;  Surgeon: Marlyne Beards, MD;  Location: [facility] OR;  Service: Orthopedics;  Laterality: Right;  AMPUTATION Left 03/16/2023  Procedure: AMPUTATION, FOOT, RAY;  Surgeon: Nadara Mustard, MD;  Location: [facility] OR;  Service: Orthopedics;  Laterality: Left;  LEFT FOOT 5TH RAY AMPUTATION 28810  cataract    Bilateral  CORNEAL TRANSPLANT Bilateral   GLAUCOMA SURGERY    HAND SURGERY Left   s/p amputation of left finger  I & D  EXTREMITY Right 04/10/2020  Procedure: IRRIGATION AND DEBRIDEMENT AND AMPUTATION OF RIGHT RING FINGER;  Surgeon: Bradly Bienenstock, MD;  Location: [facility] OR;  Service: Orthopedics;  Laterality: Right;  Mahala Menghini., M.A. CCC-SLP Acute Rehabilitation Services Office [phone] Secure chat preferred 04/16/2023, 10:57 AM  ECHOCARDIOGRAM COMPLETE Result Date: 04/13/2023    ECHOCARDIOGRAM REPORT   Patient Name:   ROBY DONAWAY Date of Exam: 04/13/2023 Medical Rec #:  [id number]      Height:       69.0 in Accession #:    [id number]     Weight:       221.6 lb Date of Birth:  1940-07-29       BSA:          2.158 m Patient Age:    83 years       BP:           121/67 mmHg Patient Gender: M              HR:           111 bpm. Exam Location:  Inpatient Procedure: 2D Echo, Cardiac Doppler and Color Doppler (Both Spectral and Color            Flow Doppler were utilized during procedure). Indications:    Dyspnea R06.00  History:        Patient has prior history of Echocardiogram examinations, most                 recent 07/30/2019. Risk Factors:Hypertension and Diabetes.  Sonographer:    Darlys Gales Referring Phys: [id number] MAURICIO DANIEL ARRIEN  Sonographer Comments: Image acquisition challenging due to uncooperative patient. IMPRESSIONS  1. Left ventricular ejection fraction, by estimation, is 30 to 35%. The left ventricle has moderately decreased function. The left ventricle demonstrates global hypokinesis. Left ventricular diastolic parameters are indeterminate.  2. Right ventricular systolic function is normal. The right ventricular size is normal. There is normal pulmonary artery systolic pressure. The estimated right ventricular systolic pressure is 35.2 mmHg.  3. Left atrial size was mildly dilated.  4. Right atrial size was mildly dilated.  5. The mitral valve is degenerative. Severe mitral valve regurgitation with PISA ERO 0.7 cm^2. Moderate mitral stenosis. The mean mitral valve gradient is 10.0 mmHg. The mitral stenosis is hard to quantify accurately as the patient is tachycardic (HR 110s) and also high flow across the valve with suspected severe mitral regurgitation. Moderate mitral annular calcification.  6. The aortic  valve is tricuspid. There is severe calcifcation of the aortic valve. Aortic valve regurgitation is mild. Moderate aortic valve stenosis. Aortic valve area, by VTI measures 1.31 cm. Aortic valve mean gradient measures 24.0 mmHg.  7. Tricuspid valve regurgitation is mild to moderate.  8. The inferior vena cava is normal in size with <50% respiratory variability, suggesting right atrial pressure of 8 mmHg.  9. The patient is in atrial fibrillation with RVR. Would recommend reassessing mitral and aortic valves by TEE, ideally with patient in NSR. FINDINGS  Left Ventricle: Left ventricular ejection fraction, by estimation, is 30 to 35%. The left ventricle has moderately decreased function. The left ventricle demonstrates global hypokinesis. The left ventricular internal cavity size was normal in size. There is no left ventricular hypertrophy. Left ventricular diastolic parameters are indeterminate. Right Ventricle: The right ventricular size is normal. No increase in right ventricular wall thickness. Right ventricular systolic function is normal. There is normal pulmonary artery systolic pressure. The tricuspid regurgitant velocity is 2.61 m/s,  and  with an assumed right atrial pressure of 8 mmHg, the estimated right ventricular systolic pressure is 35.2 mmHg. Left Atrium: Left atrial size was mildly dilated. Right Atrium: Right atrial size was mildly dilated. Pericardium: There is no evidence of pericardial effusion. Mitral Valve: The mitral valve is degenerative in appearance. There is moderate calcification of the mitral valve leaflet(s). Moderate mitral annular calcification. Severe mitral valve regurgitation. Moderate mitral valve stenosis. MV peak gradient, 14.7  mmHg. The mean mitral valve gradient is 10.0 mmHg. Tricuspid Valve: The tricuspid valve is normal in structure. Tricuspid valve regurgitation is mild to moderate. Aortic Valve: The aortic valve is tricuspid. There is severe calcifcation of the aortic  valve. Aortic valve regurgitation is mild. Moderate aortic stenosis is present. Aortic valve mean gradient measures 24.0 mmHg. Aortic valve peak gradient measures 33.2 mmHg. Aortic valve area, by VTI measures 1.31 cm. Pulmonic Valve: The pulmonic valve was normal in structure. Pulmonic valve regurgitation is not visualized. Aorta: The aortic root is normal in size and structure. Venous: The inferior vena cava is normal in size with less than 50% respiratory variability, suggesting right atrial pressure of 8 mmHg. IAS/Shunts: No atrial level shunt detected by color flow Doppler.  LEFT VENTRICLE PLAX 2D LVIDd:         4.30 cm   Diastology LVIDs:         2.90 cm   LV e' medial:    8.70 cm/s LV PW:         1.20 cm   LV E/e' medial:  21.1 LV IVS:        0.90 cm   LV e' lateral:   10.70 cm/s LVOT diam:     2.00 cm   LV E/e' lateral: 17.2 LV SV:         62 LV SV Index:   29 LVOT Area:     3.14 cm  RIGHT VENTRICLE RV S prime:     12.10 cm/s TAPSE (M-mode): 1.7 cm LEFT ATRIUM             Index        RIGHT ATRIUM           Index LA Vol (A2C):   79.3 ml 36.75 ml/m  RA Area:     18.00 cm LA Vol (A4C):   53.8 ml 24.93 ml/m  RA Volume:   47.60 ml  22.06 ml/m LA Biplane Vol: 69.6 ml 32.25 ml/m  AORTIC VALVE AV Area (Vmax):    1.20 cm AV Area (Vmean):   1.19 cm AV Area (VTI):     1.31 cm AV Vmax:           288.00 cm/s AV Vmean:          216.500 cm/s AV VTI:            0.476 m AV Peak Grad:      33.2 mmHg AV Mean Grad:      24.0 mmHg LVOT Vmax:         110.00 cm/s LVOT Vmean:        81.900 cm/s LVOT VTI:          0.198 m LVOT/AV VTI ratio: 0.42  AORTA Ao Root diam: 3.30 cm MITRAL VALVE                  TRICUSPID VALVE MV Area (PHT): 6.96 cm       TR Peak grad:   27.2 mmHg MV Area VTI:  2.02 cm       TR Vmax:        261.00 cm/s MV Peak grad:  14.7 mmHg MV Mean grad:  10.0 mmHg      SHUNTS MV Vmax:       1.92 m/s       Systemic VTI:  0.20 m MV Vmean:      146.5 cm/s     Systemic Diam: 2.00 cm MV Decel Time: 109 msec MR  Peak grad:    99.6 mmHg MR Mean grad:    68.0 mmHg MR Vmax:         499.00 cm/s MR Vmean:        397.0 cm/s MR PISA:         10.62 cm MR PISA Eff ROA: 71 mm MR PISA Radius:  1.30 cm MV E velocity: 184.00 cm/s Dalton McleanMD Electronically signed by Wilfred Lacy Signature Date/Time: 04/13/2023/2:48:43 PM    Final    US Abdomen Limited RUQ (LIVER/GB) Result Date: 04/12/2023 CLINICAL DATA:  Transaminitis EXAM: ULTRASOUND ABDOMEN LIMITED RIGHT UPPER QUADRANT COMPARISON:  Ultrasound 02/27/2021 FINDINGS: Gallbladder: No gallstones or wall thickening visualized. No sonographic Murphy sign noted by sonographer. Common bile duct: Diameter: 3 mm Liver: Diffusely echogenic hepatic parenchyma consistent with fatty liver infiltration. With this level of echogenicity evaluation for underlying mass lesion is limited and if needed follow-up contrast CT or MRI as clinically appropriate. Portal vein is patent on color Doppler imaging with normal direction of blood flow towards the liver. Other: Mild ascites IMPRESSION: Fatty liver infiltration. No gallstones or ductal dilatation. Mild ascites Electronically Signed   By: Karen Kays M.D.   On: 04/12/2023 11:56   CT Head Wo Contrast Result Date: 04/12/2023 CLINICAL DATA:  Mental status change, unknown cause. EXAM: CT HEAD WITHOUT CONTRAST TECHNIQUE: Contiguous axial images were obtained from the base of the skull through the vertex without intravenous contrast. RADIATION DOSE REDUCTION: This exam was performed according to the departmental dose-optimization program which includes automated exposure control, adjustment of the mA and/or kV according to patient size and/or use of iterative reconstruction technique. COMPARISON:  MRI and CT scan head from 02/25/2021. FINDINGS: Brain: No evidence of acute infarction, hemorrhage, hydrocephalus, extra-axial collection or mass lesion/mass effect. There is bilateral periventricular hypodensity, which is non-specific but most likely seen  in the settings of microvascular ischemic changes. Mild in extent. Otherwise normal appearance of brain parenchyma. Ventricles are normal. Cerebral volume is age appropriate. Vascular: No hyperdense vessel or unexpected calcification. Intracranial arteriosclerosis. Skull: Normal. Negative for fracture or focal lesion. Sinuses/Orbits: No acute finding. Other: Visualized mastoid air cells are unremarkable. No mastoid effusion. IMPRESSION: *No acute intracranial abnormality. Electronically Signed   By: Jules Schick M.D.   On: 04/12/2023 10:09   DG Chest Port 1 View Result Date: 04/12/2023 CLINICAL DATA:  Questionable sepsis - evaluate for abnormality. Altered mental status. Shortness of breath. EXAM: PORTABLE CHEST 1 VIEW COMPARISON:  02/25/2021. FINDINGS: Diffuse moderate-to-severe pulmonary vascular congestion and bilateral layering pleural effusions. There are probable associated compressive atelectatic changes at the lung bases. No pneumothorax. Mildly enlarged cardio-mediastinal silhouette. No acute osseous abnormalities. The soft tissues are within normal limits. IMPRESSION: *Findings favor congestive heart failure/pulmonary edema. Electronically Signed   By: Jules Schick M.D.   On: 04/12/2023 10:01   DG Foot 2 Views Left Result Date: 04/12/2023 CLINICAL DATA:  Questionable sepsis - evaluate for abnormality. Altered mental status. Lethargic. EXAM: LEFT FOOT - 2 VIEW COMPARISON:  02/27/2023. FINDINGS:  Since the prior study, patient underwent proximal transmetatarsal amputation of fifth toe. There is small amount of air in the surgical bed. The resection margin appear sharp. No acute fracture or dislocation. No aggressive osseous lesion. Mild hallux valgus deformity noted. Ankle mortise appears intact. No focal soft tissue swelling. No radiopaque foreign bodies. IMPRESSION: *Status post proximal transmetatarsal amputation of fifth toe. No acute osseous abnormality. No radiographic evidence of acute  osteomyelitis. Electronically Signed   By: Jules Schick M.D.   On: 04/12/2023 10:00    Microbiology: Recent Results (from the past 240 hours)  Blood Culture (routine x 2)     Status: None   Collection Time: 04/12/23  8:50 AM   Specimen: BLOOD RIGHT HAND  Result Value Ref Range Status   Specimen Description BLOOD RIGHT HAND  Final   Special Requests   Final    AEROBIC BOTTLE ONLY Blood Culture results may not be optimal due to an inadequate volume of blood received in culture bottles   Culture   Final    NO GROWTH 5 DAYS Performed at [facility], [address] [place], [place] [address]    Report Status 04/17/2023 FINAL  Final  Blood Culture (routine x 2)     Status: None   Collection Time: 04/12/23  8:50 PM   Specimen: BLOOD LEFT ARM  Result Value Ref Range Status   Specimen Description BLOOD LEFT ARM  Final   Special Requests   Final    BOTTLES DRAWN AEROBIC ONLY Blood Culture results may not be optimal due to an inadequate volume of blood received in culture bottles   Culture   Final    NO GROWTH 5 DAYS Performed at [facility], [address] [place], [place] [address]    Report Status 04/17/2023 FINAL  Final     Labs: Basic Metabolic Panel: Recent Labs  Lab 04/14/23 0814 04/14/23 1647 04/15/23 0313 04/16/23 0346 04/17/23 0321 04/18/23 0312 04/19/23 0408  NA 141   < > 136 136 136 138 138  K 2.6*   < > 3.2* 3.8 4.0 4.3 4.0  CL 93*   < > 94* 95* 96* 99 100  CO2 33*   < > 29 27 26 29 27   GLUCOSE 225*   < > 225* 223* 186* 210* 202*  BUN 40*   < > 31* 25* 20 22 21   CREATININE 1.72*   < > 1.30* 1.11 1.13 1.14 1.26*  CALCIUM 8.6*   < > 8.4* 8.7* 8.7* 8.6* 9.0  MG 1.8  --   --  2.2 2.1  --   --    < > = values in this interval not displayed.   Liver Function Tests: Recent Labs  Lab 04/14/23 0814  AST 175*  ALT 327*  ALKPHOS 88  BILITOT 4.4*  PROT 6.9  ALBUMIN 3.0*   No results for input(s): "LIPASE", "AMYLASE" in the last 168 hours. No  results for input(s): "AMMONIA" in the last 168 hours. CBC: Recent Labs  Lab 04/15/23 0313 04/16/23 0346 04/17/23 0321 04/18/23 0312 04/19/23 0408  WBC 11.4* 13.3* 12.3* 12.8* 14.4*  HGB 8.9* 9.1* 9.7* 9.1* 9.3*  HCT 29.2* 30.6* 33.1* 31.6* 32.5*  MCV 82.3 84.5 86.0 86.6 86.7  PLT 223 225 211 161 175   Cardiac Enzymes: No results for input(s): "CKTOTAL", "CKMB", "CKMBINDEX", "TROPONINI" in the last 168 hours. BNP: BNP (last 3 results) Recent Labs    04/12/23 0850  BNP 1,901.9*  ProBNP (last 3 results) No results for input(s): "PROBNP" in the last 8760 hours.  CBG: Recent Labs  Lab 04/19/23 0600 04/19/23 1118 04/19/23 1552 04/20/23 0645 04/20/23 1140  GLUCAP 194* 223* 318* 184* 268*       Signed:  Zannie Cove MD.  [facility] 04/20/2023, 12:02 PM

## 2023-04-20 NOTE — TOC Progression Note (Signed)
 Transition of Care (TOC) - Progression Note  Donn Pierini RN, BSN Transitions of Care Unit 4E- RN Case Manager See Treatment Team for direct phone #   Patient Details  Name: [name] MRN: [id number] Date of Birth: 02-24-1940  [facility]) CM/SW Contact  Zenda Alpers Lenn Sink, RN Phone Number: 04/20/2023, 2:04 PM  Clinical Narrative:    Marcell Anger has spoken with wife and confirmed Hospice eligibility.  DME has been ordered per liaison- bed/table- per liaison wife has requested delivery for Monday in order to give her time to make space- wife is agreeable to discharge prior to delivery.   [facility] will have admit RN available for visit tomorrow.   GOLD DNR on chart- plan to transport home via EMS.   Plan for transition home tomorrow- MD aware and agrees.    Expected Discharge Plan: Home w Hospice Care Barriers to Discharge: Continued Medical Work up  Expected Discharge Plan and Services   Discharge Planning Services: CM Consult Post Acute Care Choice: Hospice, Durable Medical Equipment Living arrangements for the past 2 months: Single Family Home                           HH Arranged: Disease Management HH Agency: [facility] Date [facility] Agency Contacted: 04/19/23 Time HH Agency Contacted: 1450 Representative spoke with at [facility] Agency: Shawn   Social Determinants of Health (SDOH) Interventions SDOH Screenings   Food Insecurity: No Food Insecurity (04/12/2023)  Housing: Low Risk  (04/12/2023)  Transportation Needs: No Transportation Needs (04/12/2023)  Utilities: Not At Risk (04/12/2023)  Social Connections: Unknown (04/12/2023)  Tobacco Use: Medium Risk (04/12/2023)    Readmission Risk Interventions     No data to display

## 2023-04-21 DIAGNOSIS — R451 Restlessness and agitation: Secondary | ICD-10-CM

## 2023-04-21 NOTE — Plan of Care (Signed)
  Problem: Pain Managment: Goal: General experience of comfort will improve and/or be controlled Outcome: Progressing   Problem: Safety: Goal: Ability to remain free from injury will improve Outcome: Progressing   Problem: Activity: Goal: Risk for activity intolerance will decrease Outcome: Not Progressing   Problem: Skin Integrity: Goal: Risk for impaired skin integrity will decrease Outcome: Not Progressing

## 2023-04-21 NOTE — TOC Progression Note (Signed)
 Transition of Care [facility]) - Progression Note    Patient Details  Name: [name] MRN: [id number] Date of Birth: 08-08-1940  Transition of Care [facility]) CM/SW Contact  Jannine Meo, RN Phone Number: 04/21/2023, 10:36 AM  Clinical Narrative:   Secure message received from provider regarding possible discharging patient today. Spoke with Wife by phone, she is not ready for patient to come home and wants to wait until she has the medical equipment delivered. Provider and [facility] liaison made aware.    Expected Discharge Plan: Home w Hospice Care Barriers to Discharge: Continued Medical Work up  Expected Discharge Plan and Services   Discharge Planning Services: CM Consult Post Acute Care Choice: Hospice, Durable Medical Equipment Living arrangements for the past 2 months: Single Family Home                           HH Arranged: Disease Management HH Agency: [facility] Date [facility] Agency Contacted: 04/19/23 Time HH Agency Contacted: 1450 Representative spoke with at [facility] Agency: Shawn   Social Determinants of Health (SDOH) Interventions SDOH Screenings   Food Insecurity: No Food Insecurity (04/12/2023)  Housing: Low Risk  (04/12/2023)  Transportation Needs: No Transportation Needs (04/12/2023)  Utilities: Not At Risk (04/12/2023)  Social Connections: Unknown (04/12/2023)  Tobacco Use: Medium Risk (04/12/2023)    Readmission Risk Interventions     No data to display

## 2023-04-21 NOTE — Progress Notes (Signed)
 No changes from a discharge summary yesterday, resting comfortably -Plan for discharge home with hospice services, family requesting additional time till equipment is delivered and arrangements can be made  Deforest Fast, MD

## 2023-04-21 NOTE — Progress Notes (Signed)
 Daily Progress Note   [name]       Date: 04/21/2023 DOB: 26-Feb-1940  Age: 83 y.o. MRN#: [id number] Attending Physician: Deforest Fast, MD Primary Care Physician: Tena Feeling, MD Admit Date: 04/12/2023  Reason for Consultation/Follow-up: Non pain symptom management, Pain control, Psychosocial/spiritual support, and Terminal Care  Subjective: I have reviewed medical records including EPIC notes, MAR, any available advanced directives as necessary, and labs. Received report from primary RN -no acute concerns.   Went to visit patient at bedside - no family/visitors present.  Patient was lying in bed asleep - did not attempt to wake to preserve comfort. No signs or non-verbal gestures of pain or discomfort noted. No respiratory distress, increased work of breathing, or secretions noted. Mitts in use.  Discussed Maite use with primary RN -per night shift patient was picking at skin and confused.  Requested RN staff utilize comfort medications for restlessness rather than utilizing mitts.  Requested mitts be removed.  Primary RN notified us  that patient is hypotensive and held carvedilol.  Will discontinue carvedilol.  Plan remains for patient's discharge home with hospice.  Noted DNR form missing from shadow chart -replaced.  Length of Stay: 9  Current Medications: Scheduled Meds:   carvedilol  25 mg Oral BID WC   mouth rinse  15 mL Mouth Rinse 4 times per day   oxyCODONE  5 mg Oral Q6H   prednisoLONE acetate  1 drop Left Eye TID    Continuous Infusions:   PRN Meds: acetaminophen **OR** acetaminophen, antiseptic oral rinse, feeding supplement, glycopyrrolate **OR** glycopyrrolate **OR** glycopyrrolate, haloperidol **OR** haloperidol **OR** haloperidol lactate,  HYDROmorphone (DILAUDID) injection, LORazepam **OR** LORazepam **OR** LORazepam, ondansetron **OR** ondansetron (ZOFRAN) IV, oxyCODONE, polyvinyl alcohol  Physical Exam Vitals and nursing note reviewed.  Constitutional:      General: He is not in acute distress.    Appearance: He is ill-appearing.  Pulmonary:     Effort: No respiratory distress.  Skin:    General: Skin is warm and dry.  Neurological:     Mental Status: He is lethargic.     Motor: Weakness present.             Vital Signs: BP (!) 97/40   Pulse 95   Temp 97.6 F (36.4 C) (Oral)   Resp 16  Ht 5\' 9"  (1.753 m)   Wt 100.2 kg   SpO2 95%   BMI 32.62 kg/m  SpO2: SpO2: 95 % O2 Device: O2 Device: Room Air O2 Flow Rate: O2 Flow Rate (L/min): 2 L/min  Intake/output summary:  Intake/Output Summary (Last 24 hours) at 04/21/2023 0957 Last data filed at 04/21/2023 0830 Gross per 24 hour  Intake 476 ml  Output 600 ml  Net -124 ml   LBM: Last BM Date : 04/21/23 Baseline Weight: Weight: 104.3 kg Most recent weight: Weight: 100.2 kg       Palliative Assessment/Data: PPS 10%      Patient Active Problem List   Diagnosis Date Noted   Acute metabolic encephalopathy 04/12/2023   Lactic acidosis 04/12/2023   Symptomatic anemia 04/12/2023   Acute on chronic systolic CHF (congestive heart failure) (HCC) 04/12/2023   Obesity, class 1 04/12/2023   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) 08/29/2021   Chronic insomnia 02/25/2021   Chronic kidney disease, stage 3a (HCC) 02/25/2021   HLD (hyperlipidemia) 02/25/2021   Obstructive sleep apnea 02/25/2021   Encephalopathy acute 02/25/2021   Leukocytosis 02/25/2021   Infection of right hand 04/10/2020   Acute osteomyelitis of hand including fingers, right (HCC) 04/10/2020   PAF (paroxysmal atrial fibrillation) (HCC) 04/10/2020   Weakness 01/06/2020   Orthostatic hypotension 01/06/2020   Type 2 diabetes mellitus with obesity (HCC) 01/06/2020   Glaucoma 01/06/2020    Tachycardia 07/10/2019   Essential hypertension 08/01/2013    Palliative Care Assessment & Plan   Patient Profile: 83 y.o. 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 Has had worsening dyspnea related to heart failure decompensation. Admission has been complicated by delirium, heart failure exacerbation, afib and CKD, as well as necrotic infected wound.   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 Copy was made and will be scanned into Vynca/ACP tab Plan is for patient's discharge home with hospice - [facility] consult previously placed Continue palliative wound care Continue current comfort focused medication regimen - no changes PMT will continue to follow and support holistically   Symptom Management Dilaudid PRN pain/dyspnea/increased work of breathing/RR>25 Scheduled oxycodone q6h; PRN doses for breakthrough pain Tylenol PRN pain/fever Biotin twice daily Benadryl PRN itching Robinul PRN secretions Haldol PRN agitation/delirium Ativan PRN anxiety/seizure/sleep/distress Zofran PRN nausea/vomiting Liquifilm Tears PRN dry eye Discontinued Coreg  Goals of Care and Additional Recommendations: Limitations on Scope of Treatment: Full Comfort Care  Code Status:     Code Status Orders  (From admission, onward)           Start     Ordered   04/19/23 1351  Do not attempt resuscitation (DNR) - Comfort care  Continuous       Question Answer Comment  If patient has no pulse and is not breathing Do Not Attempt Resuscitation   In  Pre-Arrest Conditions (Patient Is Breathing and Has a Pulse) Provide comfort measures. Relieve any mechanical airway obstruction. Avoid transfer unless required for comfort.   Consent: Discussion documented in EHR or advanced directives reviewed      04/19/23 1352           Code Status History     Date Active Date Inactive Code Status Order ID Comments User Context   04/14/2023 1506 04/19/2023 1352 Limited: Do not attempt resuscitation (DNR) -DNR-LIMITED -Do Not Intubate/DNI  [id number]  Albertus Alt, MD Inpatient   04/12/2023 1203 04/14/2023 1506 Full Code [id number]  Albertus Alt, MD ED   08/29/2021 1652 08/31/2021 1720 Full Code [id number]  Freda Jacobson, MD ED   02/25/2021 1613 02/27/2021 1948 Full Code [id number]  Raymona Caldwell, MD ED   04/10/2020 0244 04/14/2020 2117 Full Code [id number]  Dea Evert, DO ED   01/06/2020 1124 01/15/2020 0226 Full Code [id number]  Lena Qualia, MD ED      Advance Directive Documentation    Flowsheet Row Most Recent Value  Type of Advance Directive Healthcare Power of Attorney, Living will  Pre-existing out of facility DNR order (yellow form or pink MOST form) --  "MOST" Form in Place? --       Prognosis:  < 2 weeks  Discharge Planning: Home with Hospice  Care plan was discussed with primary RN  Thank you for allowing the Palliative Medicine Team to assist in the care of this patient.      Annette Killings, NP  Please contact Palliative Medicine Team phone at [phone] for questions and concerns.   *Portions of this note are a verbal dictation therefore any spelling and/or grammatical errors are due to the "Dragon Medical One" system  interpretation.

## 2023-04-22 DIAGNOSIS — D649 Anemia, unspecified: Secondary | ICD-10-CM | POA: Diagnosis not present

## 2023-04-22 MED ORDER — HYDROMORPHONE HCL 1 MG/ML IJ SOLN
0.5000 mg | INTRAMUSCULAR | Status: DC | PRN
  Administered 2023-04-22: 0.5 mg via INTRAVENOUS
  Filled 2023-04-22: qty 0.5

## 2023-04-22 MED ORDER — LORAZEPAM 2 MG/ML PO CONC: 1.0000 mg | ORAL | Status: DC | PRN

## 2023-04-22 MED ORDER — HALOPERIDOL LACTATE 2 MG/ML PO CONC: 2.0000 mg | Freq: Four times a day (QID) | ORAL | Status: DC | PRN

## 2023-04-22 MED ORDER — LORAZEPAM 2 MG/ML IJ SOLN: 1.0000 mg | INTRAMUSCULAR | Status: DC | PRN

## 2023-04-22 MED ORDER — HYDROMORPHONE HCL 1 MG/ML IJ SOLN
0.5000 mg | INTRAMUSCULAR | Status: DC | PRN
  Administered 2023-04-22 – 2023-04-23 (×5): 1 mg via INTRAVENOUS
  Filled 2023-04-22 (×5): qty 1

## 2023-04-22 MED ORDER — HALOPERIDOL LACTATE 5 MG/ML IJ SOLN
2.0000 mg | Freq: Four times a day (QID) | INTRAMUSCULAR | Status: DC | PRN
  Administered 2023-04-23 (×2): 2 mg via INTRAVENOUS
  Filled 2023-04-22 (×2): qty 1

## 2023-04-22 MED ORDER — HALOPERIDOL 1 MG PO TABS: 2.0000 mg | ORAL_TABLET | Freq: Four times a day (QID) | ORAL | Status: DC | PRN

## 2023-04-22 MED ORDER — LORAZEPAM 2 MG/ML IJ SOLN: 1.0000 mg | INTRAMUSCULAR | Status: DC

## 2023-04-22 MED ORDER — LORAZEPAM 2 MG/ML PO CONC
1.0000 mg | ORAL | Status: DC
  Administered 2023-04-22 – 2023-04-23 (×7): 1 mg via ORAL
  Filled 2023-04-22 (×8): qty 1

## 2023-04-22 MED ORDER — LORAZEPAM 1 MG PO TABS: 1.0000 mg | ORAL_TABLET | ORAL | Status: DC | PRN

## 2023-04-22 NOTE — Plan of Care (Signed)
  Problem: Health Behavior/Discharge Planning: Goal: Ability to manage health-related needs will improve Outcome: Progressing   Problem: Coping: Goal: Level of anxiety will decrease Outcome: Progressing   Problem: Pain Managment: Goal: General experience of comfort will improve and/or be controlled Outcome: Progressing   Problem: Safety: Goal: Ability to remain free from injury will improve Outcome: Progressing

## 2023-04-22 NOTE — Progress Notes (Addendum)
 Daily Progress Note   [name]       Date: 04/22/2023 DOB: [birth year]  Age: 83 y.o. MRN#: [id number] Attending Physician: Deforest Fast, MD Primary Care Physician: Tena Feeling, MD Admit Date: 04/12/2023  Reason for Consultation/Follow-up: Non pain symptom management, Pain control, Psychosocial/spiritual support, and Terminal Care  Subjective: I have reviewed medical records including EPIC notes, MAR, any available advanced directives as necessary, and labs. Noted multiple PRN medications utilized overnight. Received report from primary RN - no acute concerns. RN reports patient is restless, trying to get out of bed, scratching at skin, requiring mitts to be placed for safety.   Went to visit patient at bedside - no family/visitors present. Patient was lying in bed with eyes closed - he is restless, throwing legs off bed, multiple scratches noted to skin, mitts in use. No respiratory distress, increased work of breathing, or secretions noted.   Plan is for discharge home with hospice once DME delivered.  Will make medication adjustments - RN will remove mitts once he is more comfortable and not scratching at skin.  Length of Stay: 10  Current Medications: Scheduled Meds:   mouth rinse  15 mL Mouth Rinse 4 times per day   oxyCODONE  5 mg Oral Q6H   prednisoLONE acetate  1 drop Left Eye TID    Continuous Infusions:   PRN Meds: acetaminophen **OR** acetaminophen, antiseptic oral rinse, feeding supplement, glycopyrrolate **OR** glycopyrrolate **OR** glycopyrrolate, haloperidol **OR** haloperidol **OR** haloperidol lactate, HYDROmorphone (DILAUDID) injection, LORazepam **OR** LORazepam **OR** LORazepam, ondansetron **OR** ondansetron (ZOFRAN) IV, oxyCODONE, polyvinyl  alcohol  Physical Exam Vitals and nursing note reviewed.  Constitutional:      General: He is not in acute distress.    Appearance: He is ill-appearing.  Pulmonary:     Effort: No respiratory distress.  Skin:    General: Skin is warm and dry.  Neurological:     Mental Status: He is lethargic.     Motor: Weakness present.  Psychiatric:        Judgment: Judgment is impulsive.             Vital Signs: BP (!) 149/97 (BP Location: Right Arm)   Pulse 99   Temp (!) 97.5 F (36.4 C) (Oral)   Resp 18   Ht 5\' 9"  (1.753  m)   Wt 100.2 kg   SpO2 90%   BMI 32.62 kg/m  SpO2: SpO2: 90 % O2 Device: O2 Device: Room Air O2 Flow Rate: O2 Flow Rate (L/min): 2 L/min  Intake/output summary: No intake or output data in the 24 hours ending 04/22/23 0906 LBM: Last BM Date : 04/21/23 Baseline Weight: Weight: 104.3 kg Most recent weight: Weight: 100.2 kg       Palliative Assessment/Data: PPS 20%      Patient Active Problem List   Diagnosis Date Noted   Acute metabolic encephalopathy 04/12/2023   Lactic acidosis 04/12/2023   Symptomatic anemia 04/12/2023   Acute on chronic systolic CHF (congestive heart failure) (HCC) 04/12/2023   Obesity, class 1 04/12/2023   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) 08/29/2021   Chronic insomnia 02/25/2021   Chronic kidney disease, stage 3a (HCC) 02/25/2021   HLD (hyperlipidemia) 02/25/2021   Obstructive sleep apnea 02/25/2021   Encephalopathy acute 02/25/2021   Leukocytosis 02/25/2021   Infection of right hand 04/10/2020   Acute osteomyelitis of hand including fingers, right (HCC) 04/10/2020   PAF (paroxysmal atrial fibrillation) (HCC) 04/10/2020   Weakness 01/06/2020   Orthostatic hypotension 01/06/2020   Type 2 diabetes mellitus with obesity (HCC) 01/06/2020   Glaucoma 01/06/2020   Tachycardia 07/10/2019   Essential hypertension 08/01/2013    Palliative Care Assessment & Plan   Patient Profile: 83 y.o. male with medical history  significant of T2DM, hypertension, hyperlipidemia, paroxysmal atrial fibrillation, dyslipidemia, diabetic neuropathy, venous insufficiency, and hx of GI bleed who had a recent left foot 5th ray amputation due to osteomyelitis. Has had worsening dyspnea related to heart failure decompensation. Admission has been complicated by delirium, heart failure exacerbation, afib and CKD, as well as necrotic infected wound.   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)   Acute metabolic encephalopathy   Acute on chronic systolic CHF (congestive heart failure) (HCC)   Obesity, class 1   Terminal care  Recommendations/Plan: Continue full comfort measures Continue DNR/DNI as previously documented  Discharge home with hospice once DME delivered Continue palliative wound care Comfort focused medication regimen adjusted as noted below PMT will continue to follow and support holistically   Symptom Management Increased dilaudid to dose range of 0.5-1mg  PRN pain/dyspnea/increased work of breathing/RR>25 Scheduled oxycodone q6h; PRN doses for breakthrough pain Tylenol PRN pain/fever Biotin twice daily Benadryl PRN itching Robinul PRN secretions Increased haldol to 2mg  q6h PRN agitation/delirium Scheduled ativan concentrate q4h; increased ativan 1mg  q4h to q1h PRN anxiety/seizure/sleep/distress Zofran PRN nausea/vomiting Liquifilm Tears PRN dry eye   Goals of Care and Additional Recommendations: Limitations on Scope of Treatment: Full Comfort Care  Code Status:    Code Status Orders  (From admission, onward)           Start     Ordered   04/19/23 1351  Do not attempt resuscitation (DNR) - Comfort care  Continuous       Question Answer Comment  If patient has no  pulse and is not breathing Do Not Attempt Resuscitation   In Pre-Arrest Conditions (Patient Is Breathing and Has a Pulse) Provide comfort measures. Relieve any mechanical airway obstruction. Avoid transfer unless required for comfort.   Consent: Discussion documented in EHR or advanced directives  reviewed      04/19/23 1352           Code Status History     Date Active Date Inactive Code Status Order ID Comments User Context   04/14/2023 1506 04/19/2023 1352 Limited: Do not attempt resuscitation (DNR) -DNR-LIMITED -Do Not Intubate/DNI  [id number]  Albertus Alt, MD Inpatient   04/12/2023 1203 04/14/2023 1506 Full Code [id number]  Albertus Alt, MD ED   08/29/2021 1652 08/31/2021 1720 Full Code [id number]  Freda Jacobson, MD ED   02/25/2021 1613 02/27/2021 1948 Full Code [id number]  Raymona Caldwell, MD ED   04/10/2020 0244 04/14/2020 2117 Full Code [id number]  Dea Evert, DO ED   01/06/2020 1124 01/15/2020 0226 Full Code [id number]  Lena Qualia, MD ED      Advance Directive Documentation    Flowsheet Row Most Recent Value  Type of Advance Directive Healthcare Power of Attorney, Living will  Pre-existing out of facility DNR order (yellow form or pink MOST form) --  "MOST" Form in Place? --       Prognosis:  < 2 weeks  Discharge Planning: Home with Hospice  Care plan was discussed with primary RN  Thank you for allowing the Palliative Medicine Team to assist in the care of this patient.     Annette Killings, NP  Please contact Palliative Medicine Team phone at [phone] for questions and concerns.   *Portions of this note are a verbal dictation therefore any spelling and/or grammatical errors are due to the "Dragon Medical One" system interpretation.

## 2023-04-22 NOTE — Discharge Summary (Signed)
 Physician Discharge Summary  [name] WUJ:[id number] DOB: 1940-12-06 DOA: 04/12/2023  PCP: Tena Feeling, MD  Admit date: 04/12/2023 Discharge date: 04/22/2023  Time spent: 45 minutes  Recommendations for Outpatient Follow-up:  Home with hospice for comfort focused care   Discharge Diagnoses:  Principal Problem: Severe iron deficiency anemia Acute systolic CHF Severe mitral regurgitation Moderate aortic stenosis Severe encephalopathy Osteomyelitis Left foot wound infection, necrotic eschar   Acute on chronic systolic CHF (congestive heart failure) (HCC)   Acute metabolic encephalopathy   PAF (paroxysmal atrial fibrillation) (HCC)   Chronic kidney disease, stage 3a (HCC)   Essential hypertension   Type 2 diabetes mellitus with obesity (HCC)   Obesity, class 1   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  He noted easy fatigability, and dyspnea on exertion, -In ED, VSS, Left foot surgical wound with necrotic eschar and drainage - labs -bun 55 cr 1,91 AST 686 ALT 577, BNP 1,901 Lactic acid 8.6, Wbc 14.8 hgb 4.1 (from 9,1 on 03/16/23), plt 395 Fecal occult test negative   CXR w/ vascular congestion, bilateral central interstitial infiltrates, with bilateral pleural effusion  -Left foot radiograph with sp proximal transmetatarsal amputation of the fifth toe. no radiographic evidence of acute osteomyelitis  -4/03 2 units PRBC transfusion. placed on IV furosemide for diuresis.  -4/04 hgb 7.6. for 2 more units PRBC transfusion, volume status has  improved.   -4/05 significant agitation, has developed atrial fibrillation with rapid ventricular response. Echo with reduced EF, aortic stenosis, MR -4/06  resumed apixaban.  -4/ 07 palliative consulted -4/08 weak, confused, seen by Dr. Julio Ohm, recommended supportive care/wound care    Hospital Course:   Iron defi anemia -hemoccult neg -SP 4 units PRBC transfusion.  -Anemia panel w/ Iron defi -sp IV iron,  -not a good candidate for GI workup now in the setting of agitation and confusion   Acute on chronic systolic CHF Severe MR Mod AS -Echo w/ EF 30 to 35%, global hypokinesis, RV systolic function preserved, severe mitral valve regurgitation, moderate mitral valve stenosis, moderate aortic valve stenosis,  -Continue carvedilol -GDMT limited by hypotension -Not appropriate for ischemic eval with confusion, encephalopathy -Seen by palliative care in consultation, now comfort care plan for discharge home with hospice services   Acute metabolic encephalopathy Multifactorial encephalopathy-likely secondary to necrotic wound, infection, suspect under Cognitive dysfunction at baseline, noted to have ongoing intermittent agitation throughout this admission --dysphagia 3 with aspiration precautions.  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 Julio Ohm 4/8, felt to be primarily microcirculatory disease, recommended wound care, not a candidate for further amputations at this time -Treated with 6 days of IV cefepime and vancomycin -DC cefepime 4/9 with ongoing encephalopathy -Now continue routine wound care   PAF (paroxysmal atrial fibrillation) (HCC) Patient with atrial fibrillation and atrial flutter with rapid ventricular response.  -Now comfort care   Chronic kidney disease, stage 3a (HCC)  AKI, Hyponatremia,  Lactic acidosis. Hypokalemia.  monitor   Type 2 diabetes mellitus with obesity (HCC) Hyperglycemia.  -basal insulin 10 units , ssi   Obesity, class 1 Calculated BMI is 33.9   Consultations: Palliative care, orthopedics  Discharge Exam: Vitals:   04/22/23 0540 04/22/23 0952  BP: (!) 149/97   Pulse: 99 (!) 109  Resp: 18   Temp: (!) 97.5 F (36.4 C)   SpO2: 90% 96%   Chronically ill male laying in bed, confused HEENT: No JVD CVS: S1-S2, irregular rhythm, tachycardic, systolic murmur Lungs: Decreased breath sounds to bases Abdomen: Soft, nontender, bowel sounds present EXTR midis: Trace edema, foot with amputation, necrotic wound    Discharge Instructions    Allergies as of 04/22/2023       Reactions   Ambien [zolpidem Tartrate] Other (See Comments)   Confusion Memory loss   Aspirin Other (See Comments)   Bleeding ulcer   Cosopt [dorzolamide Hcl-timolol Mal] Other (See Comments)   Red and burning eyes   Diamox [acetazolamide] Other (See Comments)   Unknown reaction        Medication List     STOP taking these medications    amLODipine 5 MG tablet Commonly known as: NORVASC   apixaban 5 MG Tabs tablet Commonly known as: ELIQUIS   metFORMIN 500 MG tablet Commonly known as: GLUCOPHAGE   multivitamin tablet   Trulicity 1.5 MG/0.5ML Soaj Generic drug: Dulaglutide       TAKE these medications    acetaminophen 650 MG CR tablet Commonly known as: TYLENOL Take 1,300 mg by mouth daily as needed for pain.   carvedilol 6.25 MG tablet Commonly known as: COREG Take 6.25 mg by mouth 2 (two) times daily with a meal.   DULoxetine 60 MG capsule Commonly known as: CYMBALTA Take 60 mg by mouth daily.   gabapentin 600 MG tablet Commonly known as: NEURONTIN Take 600 mg by mouth 2 (two) times daily.   LORazepam 1 MG tablet Commonly known as: ATIVAN Place 1 tablet (1 mg total) under the tongue every 4 (four) hours as needed for anxiety.    oxyCODONE-acetaminophen 5-325 MG tablet Commonly known as: PERCOCET/ROXICET Take 1 tablet by mouth in the morning, at noon, and at bedtime.   prednisoLONE acetate 1 % ophthalmic suspension Commonly known as: PRED FORTE Place 1 drop into the left eye 3 (three) times daily.   traZODone 100 MG tablet Commonly known as: DESYREL Take 100 mg by mouth at bedtime.       Allergies  Allergen Reactions   Ambien [Zolpidem Tartrate] Other (See Comments)    Confusion Memory loss   Aspirin Other (See Comments)    Bleeding ulcer   Cosopt [Dorzolamide Hcl-Timolol Mal] Other (See Comments)    Red and burning eyes   Diamox [Acetazolamide] Other (See Comments)    Unknown reaction    Follow-up Information     [facility] Follow up.   Specialty: Hospice and Palliative Medicine Why: Home Hospice referral Contact information: [address] [place]  [address] [phone]                 The results of significant diagnostics from this hospitalization (including imaging, microbiology, ancillary and laboratory) are listed below for reference.    Significant Diagnostic Studies: DG Swallowing Func-Speech Pathology Result Date: 04/16/2023 Table formatting from the original result was not included. Modified Barium Swallow Study Patient Details Name: [name] MRN: [id number] Date of Birth: 07/19/1940 Today's Date: 04/16/2023 HPI/PMH:  HPI: [name] is a 83 y.o. male who, per chart review, was noted by wife at home to have easy fatigability, and dyspnea on exertion, to the point where he did not wanted to get out of the bed any more. His po intake had significantly decreased over the last 48 hrs. CXR 4/3 suggestive of CHF or pulmonary edema.  Head CT 4/3 without acute findings.  Pt with medical history significant of T2DM, hypertension, hyperlipidemia, paroxysmal atrial fibrillation, dyslipidemia, diabetic neuropathy, venous insufficiency, and hx of GI bleed who had a  recent left foot 5th ray amputation due to osteomyelitis, on 03/16/23. Clinical Impression: Clinical Impression: Pt has a mild oral dysphagia with relatively functional pharyngeal phase. He has reduced lingual hold and bolus cohesion, with repetitive lingual motion espcially with purees (with liquids, it is less repetitive but sometimes slow or delayed). Mastication is prolonged but effective. There is a collection of oral residue that he will often self-manage with a spontaneous second swallow, although especially with thin liquids this can spill into the valleculae first. His swallow initiation occurs promptly though with good timing, pharyngeal efficiency, and airway protection. No aspiration or penetration is observed (cough x1 during MBS not associated with aspiration). The barium tablet cleared his oral cavity and pharynx well but did appear to get stopped in his esophagus, moving a little further down with dry swallows. Recommend that pt continue with mechanical soft diet but will adjust liquids to be thin. Factors that may increase risk of adverse event in presence of aspiration Roderick Civatte & Jessy Morocco 2021): Factors that may increase risk of adverse event in presence of aspiration Roderick Civatte & Jessy Morocco 2021): Poor general health and/or compromised immunity; Reduced cognitive function Recommendations/Plan: Swallowing Evaluation Recommendations Swallowing Evaluation Recommendations Recommendations: PO diet PO Diet Recommendation: Dysphagia 3 (Mechanical soft); Thin liquids (Level 0) Liquid Administration via: Cup; Straw Medication Administration: Whole meds with liquid (may want to crush larger pills) Supervision: Staff to assist with self-feeding Swallowing strategies  : Slow rate; Small bites/sips; Follow solids with liquids Postural changes: Position pt fully upright for meals; Stay upright 30-60 min after meals Oral care recommendations: Oral care BID (2x/day) Treatment Plan Treatment Plan Treatment  recommendations: Therapy as outlined in treatment plan below Follow-up recommendations: No SLP follow up Functional status assessment: Patient has had a recent decline in their functional status and demonstrates the ability to make significant improvements in function in a reasonable and predictable amount of time. Treatment frequency: Min 2x/week Treatment duration: 1 week Interventions: Aspiration precaution training; Patient/family education; Diet toleration management by SLP; Trials of upgraded texture/liquids Recommendations Recommendations for follow up therapy are one component of a multi-disciplinary discharge planning process, led by the attending physician.  Recommendations may be updated based on patient status, additional functional criteria and insurance authorization. Assessment: Orofacial Exam: No data recorded Anatomy: Anatomy: WFL Boluses Administered: Boluses Administered Boluses Administered: Thin liquids (Level 0); Mildly thick liquids (Level 2, nectar thick); Moderately thick liquids (Level 3, honey thick); Puree; Solid  Oral Impairment Domain: Oral Impairment Domain Lip Closure: No labial escape Tongue control during bolus hold: Posterior escape of less than half of bolus Bolus preparation/mastication: Timely and efficient chewing and mashing Bolus transport/lingual motion: Repetitive/disorganized tongue motion Oral residue: Residue collection on oral structures Location of oral residue : Floor of mouth; Tongue (spills to valleculae) Initiation of pharyngeal swallow : Valleculae  Pharyngeal Impairment Domain: Pharyngeal Impairment Domain Soft palate elevation: No bolus between soft palate (SP)/pharyngeal wall (PW) Laryngeal elevation: Complete superior  movement of thyroid cartilage with complete approximation of arytenoids to epiglottic petiole Anterior hyoid excursion: Complete anterior movement Epiglottic movement: Complete inversion Laryngeal vestibule closure: Complete, no air/contrast in  laryngeal vestibule Pharyngeal stripping wave : Present - complete Pharyngeal contraction (A/P view only): N/A Pharyngoesophageal segment opening: Complete distension and complete duration, no obstruction of flow Tongue base retraction: No contrast between tongue base and posterior pharyngeal wall (PPW) Pharyngeal residue: Complete pharyngeal clearance Location of pharyngeal residue: N/A  Esophageal Impairment Domain: Esophageal Impairment Domain Esophageal clearance upright position: Esophageal retention Pill: Pill Consistency administered: Thin liquids (Level 0) Thin liquids (Level 0): Impaired (see clinical impressions) Penetration/Aspiration Scale Score: Penetration/Aspiration Scale Score 1.  Material does not enter airway: Thin liquids (Level 0); Mildly thick liquids (Level 2, nectar thick); Moderately thick liquids (Level 3, honey thick); Puree; Solid; Pill Compensatory Strategies: No data recorded  General Information: Caregiver present: No  Diet Prior to this Study: Dysphagia 3 (mechanical soft); Mildly thick liquids (Level 2, nectar thick)   Temperature : Normal   Respiratory Status: WFL   Supplemental O2: None (Room air)   History of Recent Intubation: No  Behavior/Cognition: Alert; Cooperative; Requires cueing Self-Feeding Abilities: Needs assist with self-feeding Baseline vocal quality/speech: Normal No data recorded Volitional Swallow: Able to elicit Exam Limitations: No limitations Goal Planning: Prognosis for improved oropharyngeal function: Fair Barriers to Reach Goals: Cognitive deficits No data recorded Patient/Family Stated Goal: unable to state, no family present Consulted and agree with results and recommendations: Patient; Physician; Nurse Pain: Pain Assessment Pain Assessment: Faces Faces Pain Scale: 0 Breathing: 1 Negative Vocalization: 1 Facial Expression: 2 Body Language: 1 Consolability: 1 PAINAD Score: 6 End of Session: Start Time:SLP Start Time (ACUTE ONLY): 0933 Stop Time: SLP Stop Time  (ACUTE ONLY): 0947 Time Calculation:SLP Time Calculation (min) (ACUTE ONLY): 14 min Charges: SLP Evaluations $ SLP Speech Visit: 1 Visit SLP Evaluations $MBS Swallow: 1 Procedure SLP visit diagnosis: SLP Visit Diagnosis: Dysphagia, oral phase (R13.11) Past Medical History: Past Medical History: Diagnosis Date  Adenomatous colon polyp   Allergic rhinitis   Anemia   Aortic stenosis 07/30/2019  mild to moderate AS (AVA VTI 1.37 cm, AV mean gradient 15.0 mmHg, AV Vmax 2.56 m/s)  BMI 40.0-44.9, adult (HCC)   Chronic insomnia   Chronic low back pain   Constipation, unspecified   Diabetes mellitus (HCC) 2015  DJD (degenerative joint disease) of knee   Hypercalcemia   Hypertension   Hypertriglyceridemia   Junctional tachycardia (HCC)   Lumbar facet arthropathy   Morbid obesity (HCC)   Neuropathy   Nonallergic rhinitis   OSA (obstructive sleep apnea)   wife denies  Other chronic pain   PAF (paroxysmal atrial fibrillation) (HCC) 01/2020  Peripheral axonal neuropathy   Upper GI bleed   due to aspirin  Venous insufficiency   Venous stasis dermatitis of both lower extremities  Past Surgical History: Past Surgical History: Procedure Laterality Date  AMPUTATION Right 08/30/2021  Procedure: Right index finger Amputaion;  Surgeon: Marilyn Shropshire, MD;  Location: [facility] OR;  Service: Orthopedics;  Laterality: Right;  AMPUTATION Left 03/16/2023  Procedure: AMPUTATION, FOOT, RAY;  Surgeon: Timothy Ford, MD;  Location: [facility] OR;  Service: Orthopedics;  Laterality: Left;  LEFT FOOT 5TH RAY AMPUTATION 28810  cataract    Bilateral  CORNEAL TRANSPLANT Bilateral   GLAUCOMA SURGERY    HAND SURGERY Left   s/p amputation of left finger  I & D EXTREMITY Right 04/10/2020  Procedure: IRRIGATION AND DEBRIDEMENT AND AMPUTATION OF  RIGHT RING FINGER;  Surgeon: Arvil Birks, MD;  Location: [facility] OR;  Service: Orthopedics;  Laterality: Right; Beth Brooke., M.A. CCC-SLP Acute Rehabilitation Services Office [phone] Secure chat preferred 04/16/2023, 10:57  AM  ECHOCARDIOGRAM COMPLETE Result Date: 04/13/2023    ECHOCARDIOGRAM REPORT   Patient Name:   [name] Date of Exam: 04/13/2023 Medical Rec #:  [id number]      Height:       69.0 in Accession #:    [id number]     Weight:       221.6 lb Date of Birth:  [birth year]       BSA:          2.158 m Patient Age:    83 years       BP:           121/67 mmHg Patient Gender: M              HR:           111 bpm. Exam Location:  Inpatient Procedure: 2D Echo, Cardiac Doppler and Color Doppler (Both Spectral and Color            Flow Doppler were utilized during procedure). Indications:    Dyspnea R06.00  History:        Patient has prior history of Echocardiogram examinations, most                 recent 07/30/2019. Risk Factors:Hypertension and Diabetes.  Sonographer:    Astrid Blamer Referring Phys: [id number] MAURICIO DANIEL ARRIEN  Sonographer Comments: Image acquisition challenging due to uncooperative patient. IMPRESSIONS  1. Left ventricular ejection fraction, by estimation, is 30 to 35%. The left ventricle has moderately decreased function. The left ventricle demonstrates global hypokinesis. Left ventricular diastolic parameters are indeterminate.  2. Right ventricular systolic function is normal. The right ventricular size is normal. There is normal pulmonary artery systolic pressure. The estimated right ventricular systolic pressure is 35.2 mmHg.  3. Left atrial size was mildly dilated.  4. Right atrial size was mildly dilated.  5. The mitral valve is degenerative. Severe mitral valve regurgitation with PISA ERO 0.7 cm^2. Moderate mitral stenosis. The mean mitral valve gradient is 10.0 mmHg. The mitral stenosis is hard to quantify accurately as the patient is tachycardic (HR 110s) and also high flow across the valve with suspected severe mitral regurgitation. Moderate mitral annular calcification.  6. The aortic valve is tricuspid. There is severe calcifcation of the aortic valve. Aortic valve regurgitation is mild.  Moderate aortic valve stenosis. Aortic valve area, by VTI measures 1.31 cm. Aortic valve mean gradient measures 24.0 mmHg.  7. Tricuspid valve regurgitation is mild to moderate.  8. The inferior vena cava is normal in size with <50% respiratory variability, suggesting right atrial pressure of 8 mmHg.  9. The patient is in atrial fibrillation with RVR. Would recommend reassessing mitral and aortic valves by TEE, ideally with patient in NSR. FINDINGS  Left Ventricle: Left ventricular ejection fraction, by estimation, is 30 to 35%. The left ventricle has moderately decreased function. The left ventricle demonstrates global hypokinesis. The left ventricular internal cavity size was normal in size. There is no left ventricular hypertrophy. Left ventricular diastolic parameters are indeterminate. Right Ventricle: The right ventricular size is normal. No increase in right ventricular wall thickness. Right ventricular systolic function is normal. There is normal pulmonary artery systolic pressure. The tricuspid regurgitant velocity is 2.61 m/s, and  with an assumed right atrial pressure of 8 mmHg,  the estimated right ventricular systolic pressure is 35.2 mmHg. Left Atrium: Left atrial size was mildly dilated. Right Atrium: Right atrial size was mildly dilated. Pericardium: There is no evidence of pericardial effusion. Mitral Valve: The mitral valve is degenerative in appearance. There is moderate calcification of the mitral valve leaflet(s). Moderate mitral annular calcification. Severe mitral valve regurgitation. Moderate mitral valve stenosis. MV peak gradient, 14.7  mmHg. The mean mitral valve gradient is 10.0 mmHg. Tricuspid Valve: The tricuspid valve is normal in structure. Tricuspid valve regurgitation is mild to moderate. Aortic Valve: The aortic valve is tricuspid. There is severe calcifcation of the aortic valve. Aortic valve regurgitation is mild. Moderate aortic stenosis is present. Aortic valve mean gradient  measures 24.0 mmHg. Aortic valve peak gradient measures 33.2 mmHg. Aortic valve area, by VTI measures 1.31 cm. Pulmonic Valve: The pulmonic valve was normal in structure. Pulmonic valve regurgitation is not visualized. Aorta: The aortic root is normal in size and structure. Venous: The inferior vena cava is normal in size with less than 50% respiratory variability, suggesting right atrial pressure of 8 mmHg. IAS/Shunts: No atrial level shunt detected by color flow Doppler.  LEFT VENTRICLE PLAX 2D LVIDd:         4.30 cm   Diastology LVIDs:         2.90 cm   LV e' medial:    8.70 cm/s LV PW:         1.20 cm   LV E/e' medial:  21.1 LV IVS:        0.90 cm   LV e' lateral:   10.70 cm/s LVOT diam:     2.00 cm   LV E/e' lateral: 17.2 LV SV:         62 LV SV Index:   29 LVOT Area:     3.14 cm  RIGHT VENTRICLE RV S prime:     12.10 cm/s TAPSE (M-mode): 1.7 cm LEFT ATRIUM             Index        RIGHT ATRIUM           Index LA Vol (A2C):   79.3 ml 36.75 ml/m  RA Area:     18.00 cm LA Vol (A4C):   53.8 ml 24.93 ml/m  RA Volume:   47.60 ml  22.06 ml/m LA Biplane Vol: 69.6 ml 32.25 ml/m  AORTIC VALVE AV Area (Vmax):    1.20 cm AV Area (Vmean):   1.19 cm AV Area (VTI):     1.31 cm AV Vmax:           288.00 cm/s AV Vmean:          216.500 cm/s AV VTI:            0.476 m AV Peak Grad:      33.2 mmHg AV Mean Grad:      24.0 mmHg LVOT Vmax:         110.00 cm/s LVOT Vmean:        81.900 cm/s LVOT VTI:          0.198 m LVOT/AV VTI ratio: 0.42  AORTA Ao Root diam: 3.30 cm MITRAL VALVE                  TRICUSPID VALVE MV Area (PHT): 6.96 cm       TR Peak grad:   27.2 mmHg MV Area VTI:   2.02 cm       TR  Vmax:        261.00 cm/s MV Peak grad:  14.7 mmHg MV Mean grad:  10.0 mmHg      SHUNTS MV Vmax:       1.92 m/s       Systemic VTI:  0.20 m MV Vmean:      146.5 cm/s     Systemic Diam: 2.00 cm MV Decel Time: 109 msec MR Peak grad:    99.6 mmHg MR Mean grad:    68.0 mmHg MR Vmax:         499.00 cm/s MR Vmean:        397.0 cm/s  MR PISA:         10.62 cm MR PISA Eff ROA: 71 mm MR PISA Radius:  1.30 cm MV E velocity: 184.00 cm/s Dalton McleanMD Electronically signed by Archer Bear Signature Date/Time: 04/13/2023/2:48:43 PM    Final    US  Abdomen Limited RUQ (LIVER/GB) Result Date: 04/12/2023 CLINICAL DATA:  Transaminitis EXAM: ULTRASOUND ABDOMEN LIMITED RIGHT UPPER QUADRANT COMPARISON:  Ultrasound 02/27/2021 FINDINGS: Gallbladder: No gallstones or wall thickening visualized. No sonographic Murphy sign noted by sonographer. Common bile duct: Diameter: 3 mm Liver: Diffusely echogenic hepatic parenchyma consistent with fatty liver infiltration. With this level of echogenicity evaluation for underlying mass lesion is limited and if needed follow-up contrast CT or MRI as clinically appropriate. Portal vein is patent on color Doppler imaging with normal direction of blood flow towards the liver. Other: Mild ascites IMPRESSION: Fatty liver infiltration. No gallstones or ductal dilatation. Mild ascites Electronically Signed   By: Adrianna Horde M.D.   On: 04/12/2023 11:56   CT Head Wo Contrast Result Date: 04/12/2023 CLINICAL DATA:  Mental status change, unknown cause. EXAM: CT HEAD WITHOUT CONTRAST TECHNIQUE: Contiguous axial images were obtained from the base of the skull through the vertex without intravenous contrast. RADIATION DOSE REDUCTION: This exam was performed according to the departmental dose-optimization program which includes automated exposure control, adjustment of the mA and/or kV according to patient size and/or use of iterative reconstruction technique. COMPARISON:  MRI and CT scan head from 02/25/2021. FINDINGS: Brain: No evidence of acute infarction, hemorrhage, hydrocephalus, extra-axial collection or mass lesion/mass effect. There is bilateral periventricular hypodensity, which is non-specific but most likely seen in the settings of microvascular ischemic changes. Mild in extent. Otherwise normal appearance of brain  parenchyma. Ventricles are normal. Cerebral volume is age appropriate. Vascular: No hyperdense vessel or unexpected calcification. Intracranial arteriosclerosis. Skull: Normal. Negative for fracture or focal lesion. Sinuses/Orbits: No acute finding. Other: Visualized mastoid air cells are unremarkable. No mastoid effusion. IMPRESSION: *No acute intracranial abnormality. Electronically Signed   By: Beula Brunswick M.D.   On: 04/12/2023 10:09   DG Chest Port 1 View Result Date: 04/12/2023 CLINICAL DATA:  Questionable sepsis - evaluate for abnormality. Altered mental status. Shortness of breath. EXAM: PORTABLE CHEST 1 VIEW COMPARISON:  02/25/2021. FINDINGS: Diffuse moderate-to-severe pulmonary vascular congestion and bilateral layering pleural effusions. There are probable associated compressive atelectatic changes at the lung bases. No pneumothorax. Mildly enlarged cardio-mediastinal silhouette. No acute osseous abnormalities. The soft tissues are within normal limits. IMPRESSION: *Findings favor congestive heart failure/pulmonary edema. Electronically Signed   By: Beula Brunswick M.D.   On: 04/12/2023 10:01   DG Foot 2 Views Left Result Date: 04/12/2023 CLINICAL DATA:  Questionable sepsis - evaluate for abnormality. Altered mental status. Lethargic. EXAM: LEFT FOOT - 2 VIEW COMPARISON:  02/27/2023. FINDINGS: Since the prior study, patient underwent proximal transmetatarsal amputation  of fifth toe. There is small amount of air in the surgical bed. The resection margin appear sharp. No acute fracture or dislocation. No aggressive osseous lesion. Mild hallux valgus deformity noted. Ankle mortise appears intact. No focal soft tissue swelling. No radiopaque foreign bodies. IMPRESSION: *Status post proximal transmetatarsal amputation of fifth toe. No acute osseous abnormality. No radiographic evidence of acute osteomyelitis. Electronically Signed   By: Beula Brunswick M.D.   On: 04/12/2023 10:00     Microbiology: Recent Results (from the past 240 hours)  Blood Culture (routine x 2)     Status: None   Collection Time: 04/12/23  8:50 PM   Specimen: BLOOD LEFT ARM  Result Value Ref Range Status   Specimen Description BLOOD LEFT ARM  Final   Special Requests   Final    BOTTLES DRAWN AEROBIC ONLY Blood Culture results may not be optimal due to an inadequate volume of blood received in culture bottles   Culture   Final    NO GROWTH 5 DAYS Performed at [facility], [address] [place], [place] [address]    Report Status 04/17/2023 FINAL  Final     Labs: Basic Metabolic Panel: Recent Labs  Lab 04/16/23 0346 04/17/23 0321 04/18/23 0312 04/19/23 0408  NA 136 136 138 138  K 3.8 4.0 4.3 4.0  CL 95* 96* 99 100  CO2 27 26 29 27   GLUCOSE 223* 186* 210* 202*  BUN 25* 20 22 21   CREATININE 1.11 1.13 1.14 1.26*  CALCIUM 8.7* 8.7* 8.6* 9.0  MG 2.2 2.1  --   --    Liver Function Tests: No results for input(s): "AST", "ALT", "ALKPHOS", "BILITOT", "PROT", "ALBUMIN" in the last 168 hours.  No results for input(s): "LIPASE", "AMYLASE" in the last 168 hours. No results for input(s): "AMMONIA" in the last 168 hours. CBC: Recent Labs  Lab 04/16/23 0346 04/17/23 0321 04/18/23 0312 04/19/23 0408  WBC 13.3* 12.3* 12.8* 14.4*  HGB 9.1* 9.7* 9.1* 9.3*  HCT 30.6* 33.1* 31.6* 32.5*  MCV 84.5 86.0 86.6 86.7  PLT 225 211 161 175   Cardiac Enzymes: No results for input(s): "CKTOTAL", "CKMB", "CKMBINDEX", "TROPONINI" in the last 168 hours. BNP: BNP (last 3 results) Recent Labs    04/12/23 0850  BNP 1,901.9*    ProBNP (last 3 results) No results for input(s): "PROBNP" in the last 8760 hours.  CBG: Recent Labs  Lab 04/19/23 0600 04/19/23 1118 04/19/23 1552 04/20/23 0645 04/20/23 1140  GLUCAP 194* 223* 318* 184* 268*       Signed:  Deforest Fast MD.  [facility] 04/22/2023, 11:55 AM

## 2023-04-23 ENCOUNTER — Other Ambulatory Visit (HOSPITAL_COMMUNITY): Payer: Self-pay

## 2023-04-23 DIAGNOSIS — D649 Anemia, unspecified: Secondary | ICD-10-CM | POA: Diagnosis not present

## 2023-04-23 MED ORDER — MORPHINE SULFATE 10 MG/5ML PO SOLN
5.0000 mg | ORAL | 0 refills | Status: AC | PRN
  Filled 2023-04-23: qty 100, 5d supply, fill #0

## 2023-04-23 MED ORDER — MORPHINE SULFATE 10 MG/5ML PO SOLN
5.0000 mg | ORAL | Status: DC | PRN
  Administered 2023-04-23: 5 mg via ORAL
  Filled 2023-04-23: qty 5
  Filled 2023-04-23: qty 4

## 2023-04-23 NOTE — Progress Notes (Signed)
 Patient not responsive today and not opening his eyes. Calm this morning but yelling out/restless this afternoon. All available PRN medications given to patient as well as prn for increased secretions that patient is no longer able to clear. Able to take some things orally depending on alertness. Patient taken home with hospice via [facility]. Medications and discharge packet sent with transport. IV removed prior to discharge.

## 2023-04-23 NOTE — Discharge Summary (Signed)
 Physician Discharge Summary  [name] ZOX:[id number] DOB: [birth year] DOA: 04/12/2023  PCP: Tena Feeling, MD  Admit date: 04/12/2023 Discharge date: 04/23/2023  Time spent: 45 minutes  Recommendations for Outpatient Follow-up:  Home with hospice for comfort focused care   Discharge Diagnoses:  Principal Problem: Severe iron deficiency anemia Acute systolic CHF Severe mitral regurgitation Moderate aortic stenosis Severe encephalopathy Osteomyelitis Left foot wound infection, necrotic eschar   Acute on chronic systolic CHF (congestive heart failure) (HCC)   Acute metabolic encephalopathy   PAF (paroxysmal atrial fibrillation) (HCC)   Chronic kidney disease, stage 3a (HCC)   Essential hypertension   Type 2 diabetes mellitus with obesity (HCC)   Obesity, class 1   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  He noted easy fatigability, and dyspnea on exertion, -In ED, VSS, Left foot surgical wound with necrotic eschar and drainage - labs -bun 55 cr 1,91 AST 686 ALT 577, BNP 1,901 Lactic acid 8.6, Wbc 14.8 hgb 4.1 (from 9,1 on 03/16/23), plt 395 Fecal occult test negative   CXR w/ vascular congestion, bilateral central interstitial infiltrates, with bilateral pleural effusion  -Left foot radiograph with sp proximal transmetatarsal amputation of the fifth toe. no radiographic evidence of acute osteomyelitis  -4/03 2 units PRBC transfusion. placed on IV furosemide for diuresis.  -4/04 hgb 7.6. for 2 more units PRBC transfusion, volume status has  improved.   -4/05 significant agitation, has developed atrial fibrillation with rapid ventricular response. Echo with reduced EF, aortic stenosis, MR -4/06  resumed apixaban.  -4/ 07 palliative consulted -4/08 weak, confused, seen by Dr. Julio Ohm, recommended supportive care/wound care    Hospital Course:   Iron defi anemia -hemoccult neg -SP 4 units PRBC transfusion.  -Anemia panel w/ Iron defi -sp IV iron,  -not a good candidate for GI workup now in the setting of agitation and confusion   Acute on chronic systolic CHF Severe MR Mod AS -Echo w/ EF 30 to 35%, global hypokinesis, RV systolic function preserved, severe mitral valve regurgitation, moderate mitral valve stenosis, moderate aortic valve stenosis,  -Continue carvedilol -GDMT limited by hypotension -Not appropriate for ischemic eval with confusion, encephalopathy -Seen by palliative care in consultation, now comfort care plan for discharge home with hospice services   Acute metabolic encephalopathy Multifactorial encephalopathy-likely secondary to necrotic wound, infection, suspect under Cognitive dysfunction at baseline, noted to have ongoing intermittent agitation throughout this admission --dysphagia 3 with aspiration precautions.  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 Julio Ohm 4/8, felt to be primarily microcirculatory disease, recommended wound care, not a candidate for further amputations at this time -Treated with 6 days of IV cefepime and vancomycin -DC cefepime 4/9 with ongoing encephalopathy -Now continue routine wound care   PAF (paroxysmal atrial fibrillation) (HCC) Patient with atrial fibrillation and atrial flutter with rapid ventricular response.  -Now comfort care   Chronic kidney disease, stage 3a (HCC)  AKI, Hyponatremia,  Lactic acidosis. Hypokalemia.  monitor   Type 2 diabetes mellitus with obesity (HCC) Hyperglycemia.  -basal insulin 10 units , ssi   Obesity, class 1 Calculated BMI is 33.9   Consultations: Palliative care, orthopedics  Discharge Exam: Vitals:   04/22/23 0952 04/23/23 0526  BP:  120/83  Pulse: (!) 109 90  Resp:  18  Temp:  (!) 97.5 F (36.4 C)  SpO2: 96%    Chronically ill male laying in bed, confused HEENT: No JVD CVS: S1-S2, irregular rhythm, tachycardic, systolic murmur Lungs: Decreased breath sounds to bases Abdomen: Soft, nontender, bowel sounds present EXTR midis: Trace edema, foot with amputation, necrotic wound    Discharge Instructions   Discharge Instructions     Diet - low sodium heart healthy   Complete by: As directed    Discharge wound care:   Complete by: As directed    Wound care  Daily      Comments: Cleanse left foot surgical site with VASHE [facility] # [phone])  cover with gauze and kerlix/tape daily.   Increase activity slowly   Complete by: As directed    No wound care   Complete by: As directed       Allergies as of 04/23/2023       Reactions   Ambien [zolpidem Tartrate] Other (See Comments)   Confusion Memory loss   Aspirin Other (See Comments)   Bleeding ulcer   Cosopt [dorzolamide Hcl-timolol Mal] Other (See Comments)   Red and burning eyes   Diamox [acetazolamide] Other (See Comments)   Unknown reaction        Medication List     STOP taking these medications    amLODipine 5 MG tablet Commonly known as: NORVASC   apixaban 5 MG Tabs tablet Commonly known as: ELIQUIS   metFORMIN 500 MG tablet Commonly known as: GLUCOPHAGE   multivitamin tablet   Trulicity 1.5 MG/0.5ML Soaj Generic drug: Dulaglutide       TAKE these medications    acetaminophen 650 MG CR tablet Commonly known as: TYLENOL Take 1,300 mg by mouth daily as needed for pain.   carvedilol 6.25 MG tablet Commonly known as: COREG Take 6.25 mg by  mouth 2 (two) times daily with a meal.   DULoxetine 60 MG capsule Commonly known as: CYMBALTA Take 60 mg by mouth daily.   gabapentin 600 MG tablet Commonly known as: NEURONTIN Take 600 mg by mouth 2 (two) times daily.   LORazepam 1 MG tablet Commonly known as: ATIVAN Place 1 tablet (1 mg total) under the tongue every 4 (four) hours as needed for anxiety.   morphine 10 MG/5ML solution Take 2.5 mLs (5 mg total) by mouth every hour as needed for up to 5 days (Pain/Dyspnea).   oxyCODONE-acetaminophen 5-325 MG tablet Commonly known as: PERCOCET/ROXICET Take 1 tablet by mouth in the morning, at noon, and at bedtime.   prednisoLONE acetate 1 % ophthalmic suspension Commonly known as: PRED FORTE Place 1 drop into the left eye 3 (three) times daily.   traZODone 100 MG tablet Commonly known as: DESYREL Take 100 mg by mouth at bedtime.               Discharge Care Instructions  (From admission, onward)           Start     Ordered   04/23/23 0000  Discharge wound care:       Comments: Wound care  Daily      Comments: Cleanse  left foot surgical site with VASHE [facility] # [id number])  cover with gauze and kerlix/tape daily.   04/23/23 1141           Allergies  Allergen Reactions   Ambien [Zolpidem Tartrate] Other (See Comments)    Confusion Memory loss   Aspirin Other (See Comments)    Bleeding ulcer   Cosopt [Dorzolamide Hcl-Timolol Mal] Other (See Comments)    Red and burning eyes   Diamox [Acetazolamide] Other (See Comments)    Unknown reaction    Follow-up Information     [facility] Follow up.   Specialty: Hospice and Palliative Medicine Why: Home Hospice referral Contact information: [address] [place] [address] [phone]                 The results of significant diagnostics from this hospitalization (including imaging, microbiology, ancillary and laboratory) are listed below for reference.    Significant  Diagnostic Studies: DG Swallowing Func-Speech Pathology Result Date: 04/16/2023 Table formatting from the original result was not included. Modified Barium Swallow Study Patient Details Name: AUGUSTE TEBBETTS MRN: [id number] Date of Birth: [birth year] Today's Date: 04/16/2023 HPI/PMH: HPI: NATHIN SARAN is a 83 y.o. male who, per chart review, was noted by wife at home to have easy fatigability, and dyspnea on exertion, to the point where he did not wanted to get out of the bed any more. His po intake had significantly decreased over the last 48 hrs. CXR 4/3 suggestive of CHF or pulmonary edema.  Head CT 4/3 without acute findings.  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 Clinical Impression: Clinical Impression: Pt has a mild oral dysphagia with relatively functional pharyngeal phase. He has reduced lingual hold and bolus cohesion, with repetitive lingual motion espcially with purees (with liquids, it is less repetitive but sometimes slow or delayed). Mastication is prolonged but effective. There is a collection of oral residue that he will often self-manage with a spontaneous second swallow, although especially with thin liquids this can spill into the valleculae first. His swallow initiation occurs promptly though with good timing, pharyngeal efficiency, and airway protection. No aspiration or penetration is observed (cough x1 during MBS not associated with aspiration). The barium tablet cleared his oral cavity and pharynx well but did appear to get stopped in his esophagus, moving a little further down with dry swallows. Recommend that pt continue with mechanical soft diet but will adjust liquids to be thin. Factors that may increase risk of adverse event in presence of aspiration Rubye Oaks & Clearance Coots  2021): Factors that may increase risk of adverse event in presence of aspiration Rubye Oaks & Clearance Coots 2021): Poor general health and/or compromised immunity; Reduced cognitive function Recommendations/Plan: Swallowing Evaluation Recommendations Swallowing Evaluation Recommendations Recommendations: PO diet PO Diet Recommendation: Dysphagia 3 (Mechanical soft); Thin liquids (Level 0) Liquid Administration via: Cup; Straw Medication Administration: Whole meds with liquid (may want to crush larger pills) Supervision: Staff to assist with self-feeding Swallowing strategies  : Slow rate; Small bites/sips; Follow solids with liquids Postural changes: Position pt fully upright for meals; Stay upright 30-60 min after meals Oral care recommendations: Oral care BID (2x/day) Treatment Plan Treatment Plan Treatment recommendations: Therapy as outlined in treatment plan below Follow-up recommendations: No SLP follow up Functional status assessment: Patient has had a recent decline in their functional  status and demonstrates the ability to make significant improvements in function in a reasonable and predictable amount of time. Treatment frequency: Min 2x/week Treatment duration: 1 week Interventions: Aspiration precaution training; Patient/family education; Diet toleration management by SLP; Trials of upgraded texture/liquids Recommendations Recommendations for follow up therapy are one component of a multi-disciplinary discharge planning process, led by the attending physician.  Recommendations may be updated based on patient status, additional functional criteria and insurance authorization. Assessment: Orofacial Exam: No data recorded Anatomy: Anatomy: WFL Boluses Administered: Boluses Administered Boluses Administered: Thin liquids (Level 0); Mildly thick liquids (Level 2, nectar thick); Moderately thick liquids (Level 3, honey thick); Puree; Solid  Oral Impairment Domain: Oral Impairment Domain Lip Closure: No labial escape  Tongue control during bolus hold: Posterior escape of less than half of bolus Bolus preparation/mastication: Timely and efficient chewing and mashing Bolus transport/lingual motion: Repetitive/disorganized tongue motion Oral residue: Residue collection on oral structures Location of oral residue : Floor of mouth; Tongue (spills to valleculae) Initiation of pharyngeal swallow : Valleculae  Pharyngeal Impairment Domain: Pharyngeal Impairment Domain Soft palate elevation: No bolus between soft palate (SP)/pharyngeal wall (PW) Laryngeal elevation: Complete superior movement of thyroid cartilage with complete approximation of arytenoids to epiglottic petiole Anterior hyoid excursion: Complete anterior movement Epiglottic movement: Complete inversion Laryngeal vestibule closure: Complete, no air/contrast in laryngeal vestibule Pharyngeal stripping wave : Present - complete Pharyngeal contraction (A/P view only): N/A Pharyngoesophageal segment opening: Complete distension and complete duration, no obstruction of flow Tongue base retraction: No contrast between tongue base and posterior pharyngeal wall (PPW) Pharyngeal residue: Complete pharyngeal clearance Location of pharyngeal residue: N/A  Esophageal Impairment Domain: Esophageal Impairment Domain Esophageal clearance upright position: Esophageal retention Pill: Pill Consistency administered: Thin liquids (Level 0) Thin liquids (Level 0): Impaired (see clinical impressions) Penetration/Aspiration Scale Score: Penetration/Aspiration Scale Score 1.  Material does not enter airway: Thin liquids (Level 0); Mildly thick liquids (Level 2, nectar thick); Moderately thick liquids (Level 3, honey thick); Puree; Solid; Pill Compensatory Strategies: No data recorded  General Information: Caregiver present: No  Diet Prior to this Study: Dysphagia 3 (mechanical soft); Mildly thick liquids (Level 2, nectar thick)   Temperature : Normal   Respiratory Status: WFL   Supplemental O2:  None (Room air)   History of Recent Intubation: No  Behavior/Cognition: Alert; Cooperative; Requires cueing Self-Feeding Abilities: Needs assist with self-feeding Baseline vocal quality/speech: Normal No data recorded Volitional Swallow: Able to elicit Exam Limitations: No limitations Goal Planning: Prognosis for improved oropharyngeal function: Fair Barriers to Reach Goals: Cognitive deficits No data recorded Patient/Family Stated Goal: unable to state, no family present Consulted and agree with results and recommendations: Patient; Physician; Nurse Pain: Pain Assessment Pain Assessment: Faces Faces Pain Scale: 0 Breathing: 1 Negative Vocalization: 1 Facial Expression: 2 Body Language: 1 Consolability: 1 PAINAD Score: 6 End of Session: Start Time:SLP Start Time (ACUTE ONLY): 0933 Stop Time: SLP Stop Time (ACUTE ONLY): 0947 Time Calculation:SLP Time Calculation (min) (ACUTE ONLY): 14 min Charges: SLP Evaluations $ SLP Speech Visit: 1 Visit SLP Evaluations $MBS Swallow: 1 Procedure SLP visit diagnosis: SLP Visit Diagnosis: Dysphagia, oral phase (R13.11) Past Medical History: Past Medical History: Diagnosis Date  Adenomatous colon polyp   Allergic rhinitis   Anemia   Aortic stenosis 07/30/2019  mild to moderate AS (AVA VTI 1.37 cm, AV mean gradient 15.0 mmHg, AV Vmax 2.56 m/s)  BMI 40.0-44.9, adult (HCC)   Chronic insomnia   Chronic low back pain   Constipation, unspecified   Diabetes  mellitus (HCC) 2015  DJD (degenerative joint disease) of knee   Hypercalcemia   Hypertension   Hypertriglyceridemia   Junctional tachycardia (HCC)   Lumbar facet arthropathy   Morbid obesity (HCC)   Neuropathy   Nonallergic rhinitis   OSA (obstructive sleep apnea)   wife denies  Other chronic pain   PAF (paroxysmal atrial fibrillation) (HCC) 01/2020  Peripheral axonal neuropathy   Upper GI bleed   due to aspirin  Venous insufficiency   Venous stasis dermatitis of both lower extremities  Past Surgical History: Past Surgical History:  Procedure Laterality Date  AMPUTATION Right 08/30/2021  Procedure: Right index finger Amputaion;  Surgeon: Marilyn Shropshire, MD;  Location: [facility] OR;  Service: Orthopedics;  Laterality: Right;  AMPUTATION Left 03/16/2023  Procedure: AMPUTATION, FOOT, RAY;  Surgeon: Timothy Ford, MD;  Location: [facility] OR;  Service: Orthopedics;  Laterality: Left;  LEFT FOOT 5TH RAY AMPUTATION 28810  cataract    Bilateral  CORNEAL TRANSPLANT Bilateral   GLAUCOMA SURGERY    HAND SURGERY Left   s/p amputation of left finger  I & D EXTREMITY Right 04/10/2020  Procedure: IRRIGATION AND DEBRIDEMENT AND AMPUTATION OF RIGHT RING FINGER;  Surgeon: Arvil Birks, MD;  Location: [facility] OR;  Service: Orthopedics;  Laterality: Right; Beth Brooke., M.A. CCC-SLP Acute Rehabilitation Services Office [phone] Secure chat preferred 04/16/2023, 10:57 AM  ECHOCARDIOGRAM COMPLETE Result Date: 04/13/2023    ECHOCARDIOGRAM REPORT   Patient Name:   KEONI RISINGER Date of Exam: 04/13/2023 Medical Rec #:  [id number]      Height:       69.0 in Accession #:    [id number]     Weight:       221.6 lb Date of Birth:  12/08/1940       BSA:          2.158 m Patient Age:    83 years       BP:           121/67 mmHg Patient Gender: M              HR:           111 bpm. Exam Location:  Inpatient Procedure: 2D Echo, Cardiac Doppler and Color Doppler (Both Spectral and Color            Flow Doppler were utilized during procedure). Indications:    Dyspnea R06.00  History:        Patient has prior history of Echocardiogram examinations, most                 recent 07/30/2019. Risk Factors:Hypertension and Diabetes.  Sonographer:    Astrid Blamer Referring Phys: [id number] MAURICIO DANIEL ARRIEN  Sonographer Comments: Image acquisition challenging due to uncooperative patient. IMPRESSIONS  1. Left ventricular ejection fraction, by estimation, is 30 to 35%. The left ventricle has moderately decreased function. The left ventricle demonstrates global hypokinesis. Left ventricular diastolic  parameters are indeterminate.  2. Right ventricular systolic function is normal. The right ventricular size is normal. There is normal pulmonary artery systolic pressure. The estimated right ventricular systolic pressure is 35.2 mmHg.  3. Left atrial size was mildly dilated.  4. Right atrial size was mildly dilated.  5. The mitral valve is degenerative. Severe mitral valve regurgitation with PISA ERO 0.7 cm^2. Moderate mitral stenosis. The mean mitral valve gradient is 10.0 mmHg. The mitral stenosis is hard to quantify accurately as the patient is tachycardic (HR 110s) and also high flow across the  valve with suspected severe mitral regurgitation. Moderate mitral annular calcification.  6. The aortic valve is tricuspid. There is severe calcifcation of the aortic valve. Aortic valve regurgitation is mild. Moderate aortic valve stenosis. Aortic valve area, by VTI measures 1.31 cm. Aortic valve mean gradient measures 24.0 mmHg.  7. Tricuspid valve regurgitation is mild to moderate.  8. The inferior vena cava is normal in size with <50% respiratory variability, suggesting right atrial pressure of 8 mmHg.  9. The patient is in atrial fibrillation with RVR. Would recommend reassessing mitral and aortic valves by TEE, ideally with patient in NSR. FINDINGS  Left Ventricle: Left ventricular ejection fraction, by estimation, is 30 to 35%. The left ventricle has moderately decreased function. The left ventricle demonstrates global hypokinesis. The left ventricular internal cavity size was normal in size. There is no left ventricular hypertrophy. Left ventricular diastolic parameters are indeterminate. Right Ventricle: The right ventricular size is normal. No increase in right ventricular wall thickness. Right ventricular systolic function is normal. There is normal pulmonary artery systolic pressure. The tricuspid regurgitant velocity is 2.61 m/s, and  with an assumed right atrial pressure of 8 mmHg, the estimated right  ventricular systolic pressure is 35.2 mmHg. Left Atrium: Left atrial size was mildly dilated. Right Atrium: Right atrial size was mildly dilated. Pericardium: There is no evidence of pericardial effusion. Mitral Valve: The mitral valve is degenerative in appearance. There is moderate calcification of the mitral valve leaflet(s). Moderate mitral annular calcification. Severe mitral valve regurgitation. Moderate mitral valve stenosis. MV peak gradient, 14.7  mmHg. The mean mitral valve gradient is 10.0 mmHg. Tricuspid Valve: The tricuspid valve is normal in structure. Tricuspid valve regurgitation is mild to moderate. Aortic Valve: The aortic valve is tricuspid. There is severe calcifcation of the aortic valve. Aortic valve regurgitation is mild. Moderate aortic stenosis is present. Aortic valve mean gradient measures 24.0 mmHg. Aortic valve peak gradient measures 33.2 mmHg. Aortic valve area, by VTI measures 1.31 cm. Pulmonic Valve: The pulmonic valve was normal in structure. Pulmonic valve regurgitation is not visualized. Aorta: The aortic root is normal in size and structure. Venous: The inferior vena cava is normal in size with less than 50% respiratory variability, suggesting right atrial pressure of 8 mmHg. IAS/Shunts: No atrial level shunt detected by color flow Doppler.  LEFT VENTRICLE PLAX 2D LVIDd:         4.30 cm   Diastology LVIDs:         2.90 cm   LV e' medial:    8.70 cm/s LV PW:         1.20 cm   LV E/e' medial:  21.1 LV IVS:        0.90 cm   LV e' lateral:   10.70 cm/s LVOT diam:     2.00 cm   LV E/e' lateral: 17.2 LV SV:         62 LV SV Index:   29 LVOT Area:     3.14 cm  RIGHT VENTRICLE RV S prime:     12.10 cm/s TAPSE (M-mode): 1.7 cm LEFT ATRIUM             Index        RIGHT ATRIUM           Index LA Vol (A2C):   79.3 ml 36.75 ml/m  RA Area:     18.00 cm LA Vol (A4C):   53.8 ml 24.93 ml/m  RA Volume:   47.60 ml  22.06 ml/m LA Biplane Vol: 69.6 ml 32.25 ml/m  AORTIC VALVE AV Area (Vmax):     1.20 cm AV Area (Vmean):   1.19 cm AV Area (VTI):     1.31 cm AV Vmax:           288.00 cm/s AV Vmean:          216.500 cm/s AV VTI:            0.476 m AV Peak Grad:      33.2 mmHg AV Mean Grad:      24.0 mmHg LVOT Vmax:         110.00 cm/s LVOT Vmean:        81.900 cm/s LVOT VTI:          0.198 m LVOT/AV VTI ratio: 0.42  AORTA Ao Root diam: 3.30 cm MITRAL VALVE                  TRICUSPID VALVE MV Area (PHT): 6.96 cm       TR Peak grad:   27.2 mmHg MV Area VTI:   2.02 cm       TR Vmax:        261.00 cm/s MV Peak grad:  14.7 mmHg MV Mean grad:  10.0 mmHg      SHUNTS MV Vmax:       1.92 m/s       Systemic VTI:  0.20 m MV Vmean:      146.5 cm/s     Systemic Diam: 2.00 cm MV Decel Time: 109 msec MR Peak grad:    99.6 mmHg MR Mean grad:    68.0 mmHg MR Vmax:         499.00 cm/s MR Vmean:        397.0 cm/s MR PISA:         10.62 cm MR PISA Eff ROA: 71 mm MR PISA Radius:  1.30 cm MV E velocity: 184.00 cm/s Dalton McleanMD Electronically signed by Wilfred Lacy Signature Date/Time: 04/13/2023/2:48:43 PM    Final    US Abdomen Limited RUQ (LIVER/GB) Result Date: 04/12/2023 CLINICAL DATA:  Transaminitis EXAM: ULTRASOUND ABDOMEN LIMITED RIGHT UPPER QUADRANT COMPARISON:  Ultrasound 02/27/2021 FINDINGS: Gallbladder: No gallstones or wall thickening visualized. No sonographic Murphy sign noted by sonographer. Common bile duct: Diameter: 3 mm Liver: Diffusely echogenic hepatic parenchyma consistent with fatty liver infiltration. With this level of echogenicity evaluation for underlying mass lesion is limited and if needed follow-up contrast CT or MRI as clinically appropriate. Portal vein is patent on color Doppler imaging with normal direction of blood flow towards the liver. Other: Mild ascites IMPRESSION: Fatty liver infiltration. No gallstones or ductal dilatation. Mild ascites Electronically Signed   By: Karen Kays M.D.   On: 04/12/2023 11:56   CT Head Wo Contrast Result Date: 04/12/2023 CLINICAL DATA:  Mental  status change, unknown cause. EXAM: CT HEAD WITHOUT CONTRAST TECHNIQUE: Contiguous axial images were obtained from the base of the skull through the vertex without intravenous contrast. RADIATION DOSE REDUCTION: This exam was performed according to the departmental dose-optimization program which includes automated exposure control, adjustment of the mA and/or kV according to patient size and/or use of iterative reconstruction technique. COMPARISON:  MRI and CT scan head from 02/25/2021. FINDINGS: Brain: No evidence of acute infarction, hemorrhage, hydrocephalus, extra-axial collection or mass lesion/mass effect. There is bilateral periventricular hypodensity, which is non-specific but most likely seen in the settings of microvascular ischemic changes. Mild in extent. Otherwise  normal appearance of brain parenchyma. Ventricles are normal. Cerebral volume is age appropriate. Vascular: No hyperdense vessel or unexpected calcification. Intracranial arteriosclerosis. Skull: Normal. Negative for fracture or focal lesion. Sinuses/Orbits: No acute finding. Other: Visualized mastoid air cells are unremarkable. No mastoid effusion. IMPRESSION: *No acute intracranial abnormality. Electronically Signed   By: Beula Brunswick M.D.   On: 04/12/2023 10:09   DG Chest Port 1 View Result Date: 04/12/2023 CLINICAL DATA:  Questionable sepsis - evaluate for abnormality. Altered mental status. Shortness of breath. EXAM: PORTABLE CHEST 1 VIEW COMPARISON:  02/25/2021. FINDINGS: Diffuse moderate-to-severe pulmonary vascular congestion and bilateral layering pleural effusions. There are probable associated compressive atelectatic changes at the lung bases. No pneumothorax. Mildly enlarged cardio-mediastinal silhouette. No acute osseous abnormalities. The soft tissues are within normal limits. IMPRESSION: *Findings favor congestive heart failure/pulmonary edema. Electronically Signed   By: Beula Brunswick M.D.   On: 04/12/2023 10:01   DG  Foot 2 Views Left Result Date: 04/12/2023 CLINICAL DATA:  Questionable sepsis - evaluate for abnormality. Altered mental status. Lethargic. EXAM: LEFT FOOT - 2 VIEW COMPARISON:  02/27/2023. FINDINGS: Since the prior study, patient underwent proximal transmetatarsal amputation of fifth toe. There is small amount of air in the surgical bed. The resection margin appear sharp. No acute fracture or dislocation. No aggressive osseous lesion. Mild hallux valgus deformity noted. Ankle mortise appears intact. No focal soft tissue swelling. No radiopaque foreign bodies. IMPRESSION: *Status post proximal transmetatarsal amputation of fifth toe. No acute osseous abnormality. No radiographic evidence of acute osteomyelitis. Electronically Signed   By: Beula Brunswick M.D.   On: 04/12/2023 10:00    Microbiology: No results found for this or any previous visit (from the past 240 hours).    Labs: Basic Metabolic Panel: Recent Labs  Lab 04/17/23 0321 04/18/23 0312 04/19/23 0408  NA 136 138 138  K 4.0 4.3 4.0  CL 96* 99 100  CO2 26 29 27   GLUCOSE 186* 210* 202*  BUN 20 22 21   CREATININE 1.13 1.14 1.26*  CALCIUM 8.7* 8.6* 9.0  MG 2.1  --   --    Liver Function Tests: No results for input(s): "AST", "ALT", "ALKPHOS", "BILITOT", "PROT", "ALBUMIN" in the last 168 hours.  No results for input(s): "LIPASE", "AMYLASE" in the last 168 hours. No results for input(s): "AMMONIA" in the last 168 hours. CBC: Recent Labs  Lab 04/17/23 0321 04/18/23 0312 04/19/23 0408  WBC 12.3* 12.8* 14.4*  HGB 9.7* 9.1* 9.3*  HCT 33.1* 31.6* 32.5*  MCV 86.0 86.6 86.7  PLT 211 161 175   Cardiac Enzymes: No results for input(s): "CKTOTAL", "CKMB", "CKMBINDEX", "TROPONINI" in the last 168 hours. BNP: BNP (last 3 results) Recent Labs    04/12/23 0850  BNP 1,901.9*    ProBNP (last 3 results) No results for input(s): "PROBNP" in the last 8760 hours.  CBG: Recent Labs  Lab 04/19/23 0600 04/19/23 1118  04/19/23 1552 04/20/23 0645 04/20/23 1140  GLUCAP 194* 223* 318* 184* 268*       Signed:  Magdalene School MD.  [facility] 04/23/2023, 11:41 AM

## 2023-04-23 NOTE — Progress Notes (Addendum)
   Palliative Medicine Inpatient Follow Up Note HPI: 84 y.o. 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 Has had worsening dyspnea related to eart failure decompensation.   Palliative case has been asked to get involved to support additional goals of care conversations.   Today's Discussion 04/23/2023  *Please note that this is a verbal dictation therefore any spelling or grammatical errors are due to the "[employer]agon [employer] One" system interpretation.  Chart reviewed inclusive of vital signs, progress notes, laboratory results, and diagnostic images. Receiving ativan ATC.  I met at bedside with [name] this morning. He appears to be resting comfortably and is not in any distress.   Per patients RN he has required some medications for breakthrough agitation.   Have spoke to hospice liaison for [facility]. Plan to transition home this evening.  Dilaudid has been switched to oral morphine.   Patients family prefer for [name] to go home as opposed to inpatient hospice.   Questions and concerns addressed/Palliative Support Provided.   Objective Assessment: Vital Signs Vitals:   04/23/23 0526 04/23/23 1100  BP: 120/83   Pulse: 90   Resp: 18   Temp: (!) 97.5 F (36.4 C)   SpO2:  94%    Intake/Output Summary (Last 24 hours) at 04/23/2023 1151 Last data filed at 04/22/2023 1255 Gross per 24 hour  Intake 0 ml  Output --  Net 0 ml   Last Weight  Most recent update: 04/16/2023  3:52 AM    Weight  100.2 kg (220 lb 14.4 oz)            Gen: Elderly Caucasian male chronically ill in appearance HEENT: moist mucous membranes CV: Irregular rate and rhythm  PULM: On room air breathing is even and nonlabored ABD: soft/nontender  EXT: (+) L toe/foot wound - necrotic tissue  visualized Neuro: Disoriented  SUMMARY OF RECOMMENDATIONS   DNR/DNI as previously documented - durable DNR form completed and placed in shadow chart. Copy was made and will be scanned into Vynca/ACP tab Comfort Measures Plan is for patient's discharge home with hospice - [facility] consult previously placed Continue palliative wound care Continue current comfort focused medication regimen - no changes Plan for discharge home today   Symptom Management Morphine soln PRN pain/dyspnea/increased work of breathing/RR>25 Scheduled oxycodone q6h; PRN doses for breakthrough pain Tylenol PRN pain/fever Biotin twice daily Benadryl PRN itching Robinul PRN secretions Haldol PRN agitation/delirium Ativan PRN anxiety/seizure/sleep/distress Zofran PRN nausea/vomiting Liquifilm Tears PRN dry eye   Plan for ongoing palliative care support  Billing based on MDM: High ______________________________________________________________________________________ [place] [place] Palliative Medicine Team Team Cell Phone: [phone] Please utilize secure chat with additional questions, if there is no response within 30 minutes please call the above phone number  Palliative Medicine Team providers are available by phone from 7am to 7pm daily and can be reached through the team cell phone.  Should this patient require assistance outside of these hours, please call the patient's attending physician.

## 2023-04-23 NOTE — TOC CM/SW Note (Signed)
 Per Shawn with [facility] all DME including oxygen has been delivered to home and family ready for discharge. Shawn requesting patient to be home by 5 pm for nurse visit. Called [facility] spoke to [place] he said he will move things around to get patient home by 5 pm

## 2023-05-10 DEATH — deceased

## 2023-11-12 ENCOUNTER — Encounter: Payer: Self-pay | Admitting: Radiology
